# Patient Record
Sex: Female | Born: 1955 | Race: Black or African American | Hispanic: No | Marital: Married | Smoking: Former smoker
Health system: Southern US, Community
[De-identification: ages and names within clinical notes are randomized; demographics above are authoritative.]

## PROBLEM LIST (undated history)

## (undated) DIAGNOSIS — E669 Obesity, unspecified: Secondary | ICD-10-CM

## (undated) DIAGNOSIS — E119 Type 2 diabetes mellitus without complications: Secondary | ICD-10-CM

## (undated) DIAGNOSIS — I4891 Unspecified atrial fibrillation: Secondary | ICD-10-CM

## (undated) DIAGNOSIS — I10 Essential (primary) hypertension: Secondary | ICD-10-CM

## (undated) DIAGNOSIS — I509 Heart failure, unspecified: Secondary | ICD-10-CM

## (undated) DIAGNOSIS — T884XXA Failed or difficult intubation, initial encounter: Secondary | ICD-10-CM

## (undated) DIAGNOSIS — R911 Solitary pulmonary nodule: Secondary | ICD-10-CM

## (undated) DIAGNOSIS — S42401A Unspecified fracture of lower end of right humerus, initial encounter for closed fracture: Secondary | ICD-10-CM

## (undated) DIAGNOSIS — G4730 Sleep apnea, unspecified: Secondary | ICD-10-CM

## (undated) DIAGNOSIS — R0600 Dyspnea, unspecified: Secondary | ICD-10-CM

## (undated) DIAGNOSIS — Z0100 Encounter for examination of eyes and vision without abnormal findings: Secondary | ICD-10-CM

## (undated) DIAGNOSIS — Z87891 Personal history of nicotine dependence: Secondary | ICD-10-CM

## (undated) DIAGNOSIS — J45909 Unspecified asthma, uncomplicated: Secondary | ICD-10-CM

## (undated) DIAGNOSIS — G4700 Insomnia, unspecified: Secondary | ICD-10-CM

## (undated) DIAGNOSIS — E785 Hyperlipidemia, unspecified: Secondary | ICD-10-CM

## (undated) DIAGNOSIS — D649 Anemia, unspecified: Secondary | ICD-10-CM

## (undated) DIAGNOSIS — Z9889 Other specified postprocedural states: Secondary | ICD-10-CM

## (undated) DIAGNOSIS — R112 Nausea with vomiting, unspecified: Secondary | ICD-10-CM

## (undated) DIAGNOSIS — Z973 Presence of spectacles and contact lenses: Secondary | ICD-10-CM

## (undated) DIAGNOSIS — I5042 Chronic combined systolic (congestive) and diastolic (congestive) heart failure: Secondary | ICD-10-CM

## (undated) HISTORY — DX: Other specified postprocedural states: Z98.890

## (undated) HISTORY — DX: Solitary pulmonary nodule: R91.1

## (undated) HISTORY — DX: Encounter for examination of eyes and vision without abnormal findings: Z01.00

## (undated) HISTORY — DX: Obesity, unspecified: E66.9

## (undated) HISTORY — DX: Essential (primary) hypertension: I10

## (undated) HISTORY — DX: Unspecified asthma, uncomplicated: J45.909

## (undated) HISTORY — DX: Nausea with vomiting, unspecified: R11.2

## (undated) HISTORY — DX: Insomnia, unspecified: G47.00

## (undated) HISTORY — PX: JOINT REPLACEMENT: SHX530

## (undated) HISTORY — DX: Anemia, unspecified: D64.9

## (undated) HISTORY — DX: Hyperlipidemia, unspecified: E78.5

## (undated) HISTORY — PX: BREAST SURGERY: SHX581

## (undated) HISTORY — PX: ABDOMINAL HYSTERECTOMY: SHX81

## (undated) HISTORY — DX: Type 2 diabetes mellitus without complications: E11.9

## (undated) HISTORY — PX: PARTIAL HYSTERECTOMY: SHX80

## (undated) HISTORY — DX: Unspecified fracture of lower end of right humerus, initial encounter for closed fracture: S42.401A

## (undated) HISTORY — DX: Personal history of nicotine dependence: Z87.891

## (undated) HISTORY — PX: BREAST EXCISIONAL BIOPSY: SUR124

## (undated) HISTORY — PX: TONSILLECTOMY: SUR1361

## (undated) SURGICAL SUPPLY — 2 items
WATCHMAN FLX PRO PROCEDURE (KITS) ×1 IMPLANT
WATCHMAN TRUSTEER PROCEDURE (KITS) ×1 IMPLANT

---
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
## 1983-02-12 HISTORY — PX: HYSTERECTOMY ABDOMINAL WITH SALPINGECTOMY: SHX6725

## 1997-05-12 ENCOUNTER — Encounter: Admission: RE | Admit: 1997-05-12 | Discharge: 1997-08-10 | Payer: Self-pay | Admitting: Family Medicine

## 2001-05-19 ENCOUNTER — Encounter: Payer: Self-pay | Admitting: Family Medicine

## 2001-05-19 ENCOUNTER — Encounter: Admission: RE | Admit: 2001-05-19 | Discharge: 2001-05-19 | Payer: Self-pay | Admitting: Family Medicine

## 2002-11-12 ENCOUNTER — Encounter: Payer: Self-pay | Admitting: Family Medicine

## 2002-11-12 ENCOUNTER — Encounter: Admission: RE | Admit: 2002-11-12 | Discharge: 2002-11-12 | Payer: Self-pay | Admitting: Family Medicine

## 2004-02-12 HISTORY — PX: ELBOW ARTHROPLASTY: SHX928

## 2004-09-19 ENCOUNTER — Emergency Department (HOSPITAL_COMMUNITY): Admission: EM | Admit: 2004-09-19 | Discharge: 2004-09-19 | Payer: Self-pay | Admitting: Emergency Medicine

## 2005-02-11 DIAGNOSIS — S42401A Unspecified fracture of lower end of right humerus, initial encounter for closed fracture: Secondary | ICD-10-CM

## 2005-02-11 HISTORY — DX: Unspecified fracture of lower end of right humerus, initial encounter for closed fracture: S42.401A

## 2005-09-02 LAB — HM COLONOSCOPY

## 2006-06-09 ENCOUNTER — Ambulatory Visit: Payer: Self-pay | Admitting: Family Medicine

## 2006-08-25 ENCOUNTER — Ambulatory Visit: Payer: Self-pay | Admitting: Family Medicine

## 2006-10-27 ENCOUNTER — Ambulatory Visit: Payer: Self-pay | Admitting: Family Medicine

## 2007-03-03 ENCOUNTER — Ambulatory Visit: Payer: Self-pay | Admitting: Family Medicine

## 2007-03-12 ENCOUNTER — Ambulatory Visit: Payer: Self-pay | Admitting: Family Medicine

## 2007-07-01 ENCOUNTER — Encounter: Admission: RE | Admit: 2007-07-01 | Discharge: 2007-07-01 | Payer: Self-pay | Admitting: Family Medicine

## 2007-07-01 ENCOUNTER — Ambulatory Visit: Payer: Self-pay | Admitting: Family Medicine

## 2007-07-09 ENCOUNTER — Ambulatory Visit: Payer: Self-pay | Admitting: Family Medicine

## 2007-10-26 ENCOUNTER — Ambulatory Visit: Payer: Self-pay | Admitting: Family Medicine

## 2008-12-28 ENCOUNTER — Ambulatory Visit: Payer: Self-pay | Admitting: Family Medicine

## 2009-07-31 ENCOUNTER — Ambulatory Visit (HOSPITAL_COMMUNITY): Admission: RE | Admit: 2009-07-31 | Discharge: 2009-07-31 | Payer: Self-pay | Admitting: Family Medicine

## 2009-07-31 LAB — HM MAMMOGRAPHY: HM Mammogram: NEGATIVE

## 2010-02-11 DIAGNOSIS — E119 Type 2 diabetes mellitus without complications: Secondary | ICD-10-CM

## 2010-02-11 HISTORY — DX: Type 2 diabetes mellitus without complications: E11.9

## 2010-02-22 ENCOUNTER — Encounter
Admission: RE | Admit: 2010-02-22 | Discharge: 2010-02-22 | Payer: Self-pay | Source: Home / Self Care | Attending: Family Medicine | Admitting: Family Medicine

## 2010-02-22 ENCOUNTER — Ambulatory Visit
Admission: RE | Admit: 2010-02-22 | Discharge: 2010-02-22 | Payer: Self-pay | Source: Home / Self Care | Attending: Family Medicine | Admitting: Family Medicine

## 2010-05-31 ENCOUNTER — Ambulatory Visit (INDEPENDENT_AMBULATORY_CARE_PROVIDER_SITE_OTHER): Payer: 59 | Admitting: Family Medicine

## 2010-05-31 DIAGNOSIS — M25519 Pain in unspecified shoulder: Secondary | ICD-10-CM

## 2010-07-05 ENCOUNTER — Telehealth: Payer: Self-pay | Admitting: Family Medicine

## 2010-07-05 NOTE — Telephone Encounter (Signed)
Pt's husband Casimiro Needle came by thought ins was denying meds Lipitor & Azor, called pharmacy, does not require P.A., pt just has high copay and Rx deductable.  Called pt back & explained.  Gave pt samples Lipitor 40mg  #14 & discount card and Azor 5/20 #28 per [place].

## 2010-08-07 ENCOUNTER — Other Ambulatory Visit: Payer: Self-pay | Admitting: Family Medicine

## 2010-08-07 DIAGNOSIS — Z1231 Encounter for screening mammogram for malignant neoplasm of breast: Secondary | ICD-10-CM

## 2010-08-09 ENCOUNTER — Ambulatory Visit (HOSPITAL_COMMUNITY)
Admission: RE | Admit: 2010-08-09 | Discharge: 2010-08-09 | Disposition: A | Discharge status: Home / Self Care | Payer: 59 | Source: Ambulatory Visit | Attending: Family Medicine | Admitting: Family Medicine

## 2010-08-09 DIAGNOSIS — Z1231 Encounter for screening mammogram for malignant neoplasm of breast: Secondary | ICD-10-CM | POA: Insufficient documentation

## 2010-09-03 ENCOUNTER — Telehealth: Payer: Self-pay | Admitting: Family Medicine

## 2010-09-03 MED ORDER — AMLODIPINE-OLMESARTAN 5-20 MG PO TABS: 1.0000 | ORAL_TABLET | Freq: Every day | ORAL | Status: DC

## 2010-09-03 MED ORDER — ATORVASTATIN CALCIUM 40 MG PO TABS: 40.0000 mg | ORAL_TABLET | Freq: Every day | ORAL | Status: DC

## 2010-09-03 NOTE — Telephone Encounter (Signed)
Lipitor and Azor called in if we have samples of Azor given to her

## 2010-09-03 NOTE — Telephone Encounter (Signed)
Lipitor and Azor called in.

## 2010-09-03 NOTE — Telephone Encounter (Signed)
CALLED PT TO LET  HER KNOW AZOR UP FRONT AND MED HAS BEEN CALLED IN

## 2010-09-21 ENCOUNTER — Ambulatory Visit (INDEPENDENT_AMBULATORY_CARE_PROVIDER_SITE_OTHER): Payer: 59 | Admitting: Medical

## 2010-09-21 ENCOUNTER — Encounter: Payer: Self-pay | Admitting: Medical

## 2010-09-21 VITALS — BP 130/80 | HR 100 | Temp 97.8°F | Resp 20 | Ht 62.0 in | Wt 210.0 lb

## 2010-09-21 DIAGNOSIS — J069 Acute upper respiratory infection, unspecified: Secondary | ICD-10-CM

## 2010-09-21 MED ORDER — AZITHROMYCIN 250 MG PO TABS: ORAL_TABLET | ORAL | Status: AC

## 2010-09-21 NOTE — Progress Notes (Signed)
  Subjective:     Darlene Wade is a 55 y.o. female who presents for 3 day hx/o head cold, not feeling well, nose stuffy, nausea, and scratchy throat.  She has a little diarrhea.  Using OTC Coricidin HBP.  She notes the last time she had head cold like this it took weeks to get over.  No hx/o recurrent sinusitis though.  No sick contacts.   No other aggravating or relieving factors.  No other c/o.  The following portions of the patient's history were reviewed and updated as appropriate: allergies, current medications, past family history, past medical history, past social history, past surgical history and problem list.  Past Medical History  Diagnosis Date  . Hypertension   . Hyperlipidemia   . Impaired fasting glucose     Review of Systems Constitutional: denies fever, chills, sweats, anorexia Skin: denies rash HEENT: denies ear pain, itchy watery eyes Cardiovascular: denies chest pain Lungs: denies wheezing, SOB Abdomen: denies abdominal pain, vomiting GU: denies dysuria  Objective:   Filed Vitals:   09/21/10 1140  BP: 130/80  Pulse: 100  Temp: 97.8 F (36.6 C)  Resp: 20    General appearance: Alert, WD/WN, no distress, mildly ill appearing, sinuses sound congested                             Skin: warm, no rash                           Head: no sinus tenderness                            Eyes: conjunctiva normal, corneas clear, PERRLA                            Ears: pearly TMs, external ear canals normal                          Nose: septum midline, turbinates swollen, with erythema and clear discharge             Mouth/throat: MMM, tongue normal, mild pharyngeal erythema                           Neck: supple, no adenopathy, no thyromegaly, nontender                          Heart: RRR, normal S1, S2, no murmurs                         Lungs: CTA bilaterally, no wheezes, rales, or rhonchi     Assessment:     Encounter Diagnosis  Name Primary?  . URI (upper  respiratory infection) Yes     Plan:   Discussed diagnosis and treatment of URI.  Suggested symptomatic OTC remedies.  Nasal saline spray for congestion.  Tylenol or Ibuprofen OTC for fever and malaise.  Call/return in 2-3 days if symptoms aren't resolving.  If much worse over weekend with thick nasal drainage, fever over 101, productive sputum, then begin Zpak.

## 2010-09-21 NOTE — Patient Instructions (Signed)
Continue Coricidin HBP for cough and symptoms, rest, hydrate well with water.  If over the weekend you develop fever >101, worse thick nasal discharge, productive sputum, or continue to worsen, then you may begin antibiotic.    Otherwise continue what you are doing to treat the symptoms.

## 2010-12-10 ENCOUNTER — Encounter: Payer: Self-pay | Admitting: Medical

## 2010-12-10 ENCOUNTER — Ambulatory Visit (INDEPENDENT_AMBULATORY_CARE_PROVIDER_SITE_OTHER): Payer: 59 | Admitting: Medical

## 2010-12-10 VITALS — BP 122/80 | HR 72 | Temp 98.2°F | Resp 16 | Wt 212.0 lb

## 2010-12-10 DIAGNOSIS — M79609 Pain in unspecified limb: Secondary | ICD-10-CM

## 2010-12-10 DIAGNOSIS — M19079 Primary osteoarthritis, unspecified ankle and foot: Secondary | ICD-10-CM

## 2010-12-10 DIAGNOSIS — M21619 Bunion of unspecified foot: Secondary | ICD-10-CM

## 2010-12-10 DIAGNOSIS — M21611 Bunion of right foot: Secondary | ICD-10-CM

## 2010-12-10 DIAGNOSIS — M79673 Pain in unspecified foot: Secondary | ICD-10-CM | POA: Insufficient documentation

## 2010-12-10 NOTE — Progress Notes (Signed)
Subjective:   HPI  Darlene Wade is a 55 y.o. female who presents for bilat feet pain.   She notes that she has been having pain and changes in her feet for months.  She has seen 2 different podiatrists for the same problem.  She notes that the first podiatrist gave injections of steroid at the site of her pain, used and orthotic for heel spurs and arthritis, but the orthotic made the pain worse.  The injections didn't help either.  The second podiatrists gave additional steroid injections into the feet with no benefit.  They too made heel orthotics.  They advised diagnoses of heel spurs bilat, arthritis of the feet, and bunions.  She notes really no improvements.  She c/o heel pain bilat, thickening of the sole of her left foot, bony growth in the middle of both feet, and swelling in the area of the bony growths. Over the weekend the pain and swelling had flared up.  Her last visit with podiatry was 6 weeks ago.   She works all day on her feet at [employer].  No other aggravating or relieving factors.  Denies surgery , injury, or trauma otherwise to feet. No other c/o.   The following portions of the patient's history were reviewed and updated as appropriate: allergies, current medications, past family history, past medical history, past social history, past surgical history and problem list.  Past Medical History  Diagnosis Date  . Hypertension   . Hyperlipidemia   . Impaired fasting glucose    No past surgical history on file.  Review of Systems Constitutional: -fever, -chills, -sweats, -unexpected -weight change,-fatigue ENT: =runny nose, -ear pain, =sore throat Cardiology:  -chest pain, -palpitations, +edema Respiratory: -cough, -shortness of breath, -wheezing Gastroenterology: -abdominal pain, -nausea, -vomiting, -diarrhea, -constipation Hematology: -bleeding or bruising problems Musculoskeletal: -arthralgias, -myalgias, -joint swelling, -back pain Ophthalmology: -vision  changes Urology: -dysuria, -difficulty urinating, -hematuria, -urinary frequency, -urgency Neurology: -headache, -weakness, -tingling, -numbness    Objective:   Physical Exam  Filed Vitals:   12/10/10 1616  BP: 122/80  Pulse: 72  Temp: 98.2 F (36.8 C)  Resp: 16    General appearance: alert, no distress, WD/WN, black female, overweight Skin: unremarkable MSK: left foot with bony prominence of great toe MTP c/w bunion, bony arthritic change of 2nd and 3rd metatarsals, volar mid and medial foot with tough tender area that could represent scar tissue, heel tender throughout, right foot with bony prominence of great toe MTP c/w bunion, worse than left foot, larger bony growth in middle of foot, 2nd - 3rd metatarsals suggestive of arthritic changes, heel tenderness, otherwise no obvious swelling, otherwise foot exam unremarkable Pulses: 1+ pedal pulses, normal cap refill Neuro: normal strength and sensation of feet and feet   Assessment and Plan :    Encounter Diagnoses  Name Primary?  Marland Kitchen Foot pain Yes  . Bilateral bunions   . Osteoarthritis of foot    Given exam findings and prior podiatry treatment, advised that she would probably benefit from orthopedic eval.  I will call and find an orthopedic specialist that specializes in feet.  We will have her sign for podiatry records.  Will refer for further eval and management.   Follow-up with referral.

## 2010-12-11 ENCOUNTER — Telehealth: Payer: Self-pay | Admitting: Family Medicine

## 2010-12-11 NOTE — Telephone Encounter (Signed)
Message copied by Janeice Robinson on Tue Dec 11, 2010 11:24 AM ------      Message from: Jac Canavan      Created: Mon Dec 10, 2010  6:38 PM       Refer to [facility], Dr. Victorino Dike for bilat foot pain, osteoarthritis, bunions, and hx/o podiatry eval and treatment without improvement.              We will need to get prior podiatry notes and send them along.  I would maker her appt for about 2 wks out or more to have time to get records.

## 2010-12-13 HISTORY — PX: FOOT ARTHROTOMY: SUR104

## 2010-12-20 ENCOUNTER — Encounter: Payer: Self-pay | Admitting: Family Medicine

## 2010-12-20 ENCOUNTER — Ambulatory Visit (INDEPENDENT_AMBULATORY_CARE_PROVIDER_SITE_OTHER): Payer: 59 | Admitting: Family Medicine

## 2010-12-20 DIAGNOSIS — R635 Abnormal weight gain: Secondary | ICD-10-CM

## 2010-12-20 DIAGNOSIS — R7301 Impaired fasting glucose: Secondary | ICD-10-CM

## 2010-12-20 DIAGNOSIS — I10 Essential (primary) hypertension: Secondary | ICD-10-CM

## 2010-12-20 DIAGNOSIS — E7800 Pure hypercholesterolemia, unspecified: Secondary | ICD-10-CM

## 2010-12-20 NOTE — Patient Instructions (Signed)
Please try and exercise at least 30-60 minutes every day--you may break this up into 15 minute intervals if you need to. If your labs are all normal, then I will fax back the form to [facility] with your clearance for surgery

## 2010-12-20 NOTE — Progress Notes (Signed)
Patient presents for surgical clearance for foot surgery. Planning to operate on right foot first.  She recently saw Vincenza Hews, after having 2 podiatry evaluations, treated with cortisone shots and orthotics without benefit.  He referred her to [facility] orthopedics.  She saw Dr. Victorino Dike.  They did x-rays and have recommended surgery.  Injections and certain shoes were recommended, however she declined this (unable to wear those shoes at work, has failed injections twice in the past).  Planning for surgery on R foot --per faxed medical clearance request, procedure is: Rt gastroc recession, Rt mod McBride bunionectomy, Rt 2nd and 3rd TMT arthorodesis, Rt Lapidus (1st TMT arthrodesis).  She has hypertension and hyperlipidemia.  Review of chart shows that last labs were 02/2010.  She also has h/o impaired fasting glucose--glucose in January was 102.  She reports being compliant with her medications.  She quit smoking last year, and has been unable to exercise much recently due to her foot pain, and has gained some weight.  She would like to have her thyroid checked. It was last checked 2 years ago and was normal.  Past Medical History  Diagnosis Date  . Hypertension   . Hyperlipidemia   . Impaired fasting glucose   . Elbow fracture, right 2007    Past Surgical History  Procedure Date  . Partial hysterectomy age 9    uterine fibroids    History   Social History  . Marital Status: Married    Spouse Name: N/A    Number of Children: N/A  . Years of Education: N/A   Occupational History  . Not on file.   Social History Main Topics  . Smoking status: Former Smoker    Quit date: 04/11/2009  . Smokeless tobacco: Never Used  . Alcohol Use: Yes     twice a year  . Drug Use: No  . Sexually Active: Not on file   Other Topics Concern  . Not on file   Social History Narrative  . No narrative on file    Family History  Problem Relation Age of Onset  . Diabetes Mother   . Hypertension Mother   .  Diabetes Maternal Aunt   . Heart disease Maternal Grandfather   . Cancer Neg Hx    Current Outpatient Prescriptions on File Prior to Visit  Medication Sig Dispense Refill  . amLODipine-olmesartan (AZOR) 5-20 MG per tablet Take 1 tablet by mouth daily.  30 tablet  5  . atorvastatin (LIPITOR) 40 MG tablet Take 1 tablet (40 mg total) by mouth daily.  30 tablet  5   Allergies  Allergen Reactions  . Naproxen   . Vicodin (Hydrocodone-Acetaminophen)    ROS:  Denies headaches, dizziness, numbness, tingling, weakness, chest pain, palpitations.  Mild cold symptoms currently.  Denies nausea, vomiting, diarrhea or stool changes, no abdominal pain.  No GU complaints.  Shoulder pain resolved.  No skin concerns, bleeding problems, or other concerns.  Denies fevers  PHYSICAL EXAM: BP 120/70  Pulse 72  Temp(Src) 98.3 F (36.8 C) (Oral)  Resp 16  Ht 5\' 2"  (1.575 m)  Wt 216 lb (97.977 kg)  BMI 39.51 kg/m2 Well developed, pleasant overweight female in no distress HEENT: PERRL, EOMI, conjunctiva clear. OP clear without lesions. Neck: no lymphadenopathy, thyromegaly or carotid bruit Heart: regular rate and rhythm without murmurs, rubs or gallops Abdomen: soft, nontender, no organomegaly or mass Extremities: no edema, 2+ pulse.  Full evaluation of feet not performed--see Shane's recent note Skin: no rash Psych:  normal mood, affect, hygiene and grooming  ASSESSMENT/PLAN:  1. Pure hypercholesterolemia  Lipid panel   controlled, due for labs  2. Essential hypertension, benign  Comprehensive metabolic panel   well controlled  3. Impaired fasting glucose  Hemoglobin A1c   labs due.  Discussed exercise, low carb diet, weight loss  4. Weight gain  TSH   requesting thyroid check.  Discussed need for exercise, diet   As long as labs are okay (no uncontrolled DM, etc.), then forms will be filled out and faxed to Dr. Victorino Dike giving clearance for her surgery

## 2010-12-21 ENCOUNTER — Other Ambulatory Visit: Payer: 59

## 2010-12-21 DIAGNOSIS — R7301 Impaired fasting glucose: Secondary | ICD-10-CM

## 2010-12-21 DIAGNOSIS — R635 Abnormal weight gain: Secondary | ICD-10-CM

## 2010-12-21 DIAGNOSIS — E7800 Pure hypercholesterolemia, unspecified: Secondary | ICD-10-CM

## 2010-12-21 DIAGNOSIS — I10 Essential (primary) hypertension: Secondary | ICD-10-CM

## 2010-12-21 LAB — LIPID PANEL
Cholesterol: 167 mg/dL (ref 0–200)
Total CHOL/HDL Ratio: 3 Ratio
Triglycerides: 87 mg/dL (ref <150)
VLDL: 17 mg/dL (ref 0–40)

## 2010-12-21 LAB — COMPREHENSIVE METABOLIC PANEL
Albumin: 3.8 g/dL (ref 3.5–5.2)
Alkaline Phosphatase: 78 U/L (ref 39–117)
BUN: 12 mg/dL (ref 6–23)
Calcium: 8.9 mg/dL (ref 8.4–10.5)
Creat: 0.77 mg/dL (ref 0.50–1.10)
Glucose, Bld: 121 mg/dL — ABNORMAL HIGH (ref 70–99)
Potassium: 4.3 mEq/L (ref 3.5–5.3)

## 2010-12-21 LAB — HEMOGLOBIN A1C: Mean Plasma Glucose: 137 mg/dL — ABNORMAL HIGH (ref <117)

## 2010-12-21 LAB — TSH: TSH: 1.388 u[IU]/mL (ref 0.350–4.500)

## 2010-12-24 ENCOUNTER — Other Ambulatory Visit: Payer: Self-pay | Admitting: *Deleted

## 2010-12-24 DIAGNOSIS — E119 Type 2 diabetes mellitus without complications: Secondary | ICD-10-CM

## 2010-12-24 MED ORDER — METFORMIN HCL 500 MG PO TABS: 500.0000 mg | ORAL_TABLET | Freq: Every day | ORAL | Status: DC

## 2011-03-04 ENCOUNTER — Telehealth: Payer: Self-pay | Admitting: Internal Medicine

## 2011-03-04 DIAGNOSIS — E782 Mixed hyperlipidemia: Secondary | ICD-10-CM

## 2011-03-04 MED ORDER — ATORVASTATIN CALCIUM 40 MG PO TABS: 40.0000 mg | ORAL_TABLET | Freq: Every day | ORAL | Status: DC

## 2011-03-04 NOTE — Telephone Encounter (Signed)
Refill for 1 month of atorvastatin 40mg  sent to [facility] as pt has appt scheduled 03/28/11.

## 2011-03-14 ENCOUNTER — Telehealth: Payer: Self-pay | Admitting: Internal Medicine

## 2011-03-14 DIAGNOSIS — I10 Essential (primary) hypertension: Secondary | ICD-10-CM

## 2011-03-14 MED ORDER — AMLODIPINE-OLMESARTAN 5-20 MG PO TABS: 1.0000 | ORAL_TABLET | Freq: Every day | ORAL | Status: DC

## 2011-03-15 NOTE — Telephone Encounter (Signed)
Done

## 2011-03-22 ENCOUNTER — Encounter: Payer: Self-pay | Admitting: Internal Medicine

## 2011-03-28 ENCOUNTER — Ambulatory Visit: Payer: 59 | Admitting: Family Medicine

## 2011-04-01 ENCOUNTER — Ambulatory Visit (INDEPENDENT_AMBULATORY_CARE_PROVIDER_SITE_OTHER): Payer: Self-pay | Admitting: Family Medicine

## 2011-04-01 ENCOUNTER — Encounter: Payer: Self-pay | Admitting: Family Medicine

## 2011-04-01 VITALS — BP 120/78 | HR 76 | Ht 62.0 in | Wt 220.0 lb

## 2011-04-01 DIAGNOSIS — I10 Essential (primary) hypertension: Secondary | ICD-10-CM | POA: Insufficient documentation

## 2011-04-01 DIAGNOSIS — R7301 Impaired fasting glucose: Secondary | ICD-10-CM | POA: Insufficient documentation

## 2011-04-01 DIAGNOSIS — E7800 Pure hypercholesterolemia, unspecified: Secondary | ICD-10-CM | POA: Insufficient documentation

## 2011-04-01 DIAGNOSIS — E119 Type 2 diabetes mellitus without complications: Secondary | ICD-10-CM

## 2011-04-01 MED ORDER — AMLODIPINE-OLMESARTAN 5-20 MG PO TABS: 1.0000 | ORAL_TABLET | Freq: Every day | ORAL | Status: DC

## 2011-04-01 NOTE — Progress Notes (Signed)
Darlene Wade has been out of work on disability related to her right foot surgery.  Wasn't aware that her insurance wasn't effective until Darlene Wade arrived for visit.  Hoping to get that figured out soon.  Darlene Wade presents to follow up on her blood sugars.  Was never diagnosed with diabetes, just impaired fasting glucose/pre-diabetes.  A1c at last visit was 6.4 with fasting sugar of 121.  Darlene Wade was started on Metformin once daily.  Denies any side effects.  Sugars are running under 110 most of the time.  Had a low of 88, usually 95-105, never >120.  Occasionally has some loose stool, infrequent.  HTN--doing well.  Compliant with meds. Denies headaches or dizziness Hyperlipidemia--compliant with meds, diet.  Denies side effects  Past Medical History  Diagnosis Date  . Hypertension   . Hyperlipidemia   . Impaired fasting glucose   . Elbow fracture, right 2007  . Smoker     Past Surgical History  Procedure Date  . Partial hysterectomy age 61    uterine fibroids    History   Social History  . Marital Status: Married    Spouse Name: N/A    Number of Children: N/A  . Years of Education: N/A   Occupational History  . Not on file.   Social History Main Topics  . Smoking status: Former Smoker    Quit date: 04/11/2009  . Smokeless tobacco: Never Used  . Alcohol Use: Yes     twice a year  . Drug Use: No  . Sexually Active: Not on file   Other Topics Concern  . Not on file   Social History Narrative  . No narrative on file    Family History  Problem Relation Age of Onset  . Diabetes Mother   . Hypertension Mother   . Diabetes Maternal Aunt   . Heart disease Maternal Grandfather   . Cancer Neg Hx     Current outpatient prescriptions:amLODipine-olmesartan (AZOR) 5-20 MG per tablet, Take 1 tablet by mouth daily., Disp: 30 tablet, Rfl: 1;  atorvastatin (LIPITOR) 40 MG tablet, Take 1 tablet (40 mg total) by mouth daily., Disp: 30 tablet, Rfl: 0;  metFORMIN (GLUCOPHAGE) 500 MG tablet, Take 1  tablet (500 mg total) by mouth daily with breakfast., Disp: 30 tablet, Rfl: 3 traMADol (ULTRAM) 50 MG tablet, Take 50 mg by mouth every 6 (six) hours as needed., Disp: , Rfl:   Allergies  Allergen Reactions  . Naproxen Nausea Only  . Vicodin (Hydrocodone-Acetaminophen) Nausea Only   ROS: Denies fevers, URI symptoms, cough, shortness of breath, chest pain. Occasional diarrhea.  No vision changes--saw eye doctor last week, slight Rx change.  no skin rashes/lesions.  Recovering from her foot surgery--wearing boot when walking around, and gradually getting out of the boot. Occasional back pain  PHYSICAL EXAM: BP 120/78  Pulse 76  Ht 5\' 2"  (1.575 m)  Wt 220 lb (99.791 kg)  BMI 40.24 kg/m2 Weight was 220 with boot on Well developed, pleasant, obese female in no distress Neck: no lymphadenopathy or mass Heart: regular rate and rhythm Lungs: clear bilaterally Extremities: R foot in boot.  L foot without edema Skin-no rashes  Lab Results  Component Value Date   HGBA1C 6.3 04/01/2011   ASSESSMENT/PLAN: 1. Type II or unspecified type diabetes mellitus without mention of complication, not stated as uncontrolled  POCT HgB A1C  2. Impaired fasting glucose    3. Unspecified essential hypertension  amLODipine-olmesartan (AZOR) 5-20 MG per tablet  4. Pure hypercholesterolemia  Continue metformin  Hyperlipidemia--continue lipitor--labs not due for another 3 months.  Discussed cost, consider changing to 80mg  and cutting in 1/2 if insurance doesn't get straightened out  HTN--well controlled.  If ongoing insurance issue, consider changing to generic meds (ie amlodipine and losartan).  For now, will give samples  Med check in 3 months, fasting

## 2011-04-01 NOTE — Patient Instructions (Signed)
Check on prices of atorvastatin (generic Lipitor) at [employer], [employer], [facility].  Check on price of both 40mg  and 80mg .  If 80mg  is about the same price, you can cut tablet in 1/2 to save money

## 2011-04-22 DIAGNOSIS — Z0271 Encounter for disability determination: Secondary | ICD-10-CM

## 2011-05-21 ENCOUNTER — Telehealth: Payer: Self-pay | Admitting: Family Medicine

## 2011-05-21 NOTE — Telephone Encounter (Signed)
LM

## 2011-05-29 ENCOUNTER — Telehealth: Payer: Self-pay | Admitting: Internal Medicine

## 2011-05-29 DIAGNOSIS — E119 Type 2 diabetes mellitus without complications: Secondary | ICD-10-CM

## 2011-05-29 MED ORDER — METFORMIN HCL 500 MG PO TABS: 500.0000 mg | ORAL_TABLET | Freq: Every day | ORAL | Status: DC

## 2011-05-29 NOTE — Telephone Encounter (Signed)
Refilled medication x 1 month. Left message for patient to let her know that she is due for med check ~ 06/29/11 according to last OV note. Asked her to please call and scheduled OV for then as there was not an appt scheduled already.

## 2011-06-20 ENCOUNTER — Telehealth: Payer: Self-pay | Admitting: Family Medicine

## 2011-06-20 DIAGNOSIS — I10 Essential (primary) hypertension: Secondary | ICD-10-CM

## 2011-06-20 MED ORDER — AMLODIPINE-OLMESARTAN 5-20 MG PO TABS: 1.0000 | ORAL_TABLET | Freq: Every day | ORAL | Status: DC

## 2011-06-20 NOTE — Telephone Encounter (Signed)
Called patient to let her know that I gave her # 28 samples of Azor 5/20.

## 2011-07-01 ENCOUNTER — Encounter: Payer: Self-pay | Admitting: Family Medicine

## 2011-07-01 ENCOUNTER — Ambulatory Visit (INDEPENDENT_AMBULATORY_CARE_PROVIDER_SITE_OTHER): Payer: 59 | Admitting: Family Medicine

## 2011-07-01 VITALS — BP 120/78 | HR 84 | Ht 63.0 in | Wt 220.0 lb

## 2011-07-01 DIAGNOSIS — E7800 Pure hypercholesterolemia, unspecified: Secondary | ICD-10-CM

## 2011-07-01 DIAGNOSIS — I10 Essential (primary) hypertension: Secondary | ICD-10-CM

## 2011-07-01 DIAGNOSIS — E119 Type 2 diabetes mellitus without complications: Secondary | ICD-10-CM

## 2011-07-01 LAB — HEPATIC FUNCTION PANEL
Bilirubin, Direct: 0.1 mg/dL (ref 0.0–0.3)
Indirect Bilirubin: 0.2 mg/dL (ref 0.0–0.9)
Total Bilirubin: 0.3 mg/dL (ref 0.3–1.2)

## 2011-07-01 LAB — LIPID PANEL
Total CHOL/HDL Ratio: 3.9 Ratio
VLDL: 26 mg/dL (ref 0–40)

## 2011-07-01 LAB — GLUCOSE, RANDOM: Glucose, Bld: 95 mg/dL (ref 70–99)

## 2011-07-01 LAB — POCT GLYCOSYLATED HEMOGLOBIN (HGB A1C): Hemoglobin A1C: 6.5

## 2011-07-01 MED ORDER — AMLODIPINE-OLMESARTAN 5-20 MG PO TABS: 1.0000 | ORAL_TABLET | Freq: Every day | ORAL | Status: DC

## 2011-07-01 MED ORDER — METFORMIN HCL 500 MG PO TABS: 500.0000 mg | ORAL_TABLET | Freq: Two times a day (BID) | ORAL | Status: DC

## 2011-07-01 NOTE — Progress Notes (Signed)
Chief Complaint  Patient presents with  . Follow-up    3 month f/u on DM, needs refill on meds excet tramadol   HPI:  Diabetes follow-up:  Blood sugars at home are running 92-120.  Denies hypoglycemia.  Denies polydipsia and polyuria. Some occasional loose stools, she thinks related to metformin.  Last eye exam was last year.  Patient follows a low sugar diet and checks feet regularly without concerns. Does water aerobics 3x/week, and also gets on treadmill, bike.  Hyperlipidemia follow-up:  Patient is reportedly following a low-fat, low cholesterol diet.  Compliant with medications and denies medication side effects  Hypertension follow-up:  Blood pressures are not checked elsewhere.  Denies dizziness, chest pain, leg swelling (just related to foot surgery.  Denies side effects of medications. Occasional headaches, related to stress  Past Medical History  Diagnosis Date  . Hypertension   . Hyperlipidemia   . Impaired fasting glucose   . Elbow fracture, right 2007  . Smoker     Past Surgical History  Procedure Date  . Partial hysterectomy age 56    uterine fibroids    History   Social History  . Marital Status: Married    Spouse Name: N/A    Number of Children: N/A  . Years of Education: N/A   Occupational History  . Not on file.   Social History Main Topics  . Smoking status: Former Smoker    Quit date: 04/11/2009  . Smokeless tobacco: Never Used  . Alcohol Use: Yes     twice a year  . Drug Use: No  . Sexually Active: Not on file   Other Topics Concern  . Not on file   Social History Narrative  . No narrative on file    Family History  Problem Relation Age of Onset  . Diabetes Mother   . Hypertension Mother   . Diabetes Maternal Aunt   . Heart disease Maternal Grandfather   . Cancer Neg Hx     Current outpatient prescriptions:amLODipine-olmesartan (AZOR) 5-20 MG per tablet, Take 1 tablet by mouth daily., Disp: 30 tablet, Rfl: 5;  atorvastatin  (LIPITOR) 40 MG tablet, Take 1 tablet (40 mg total) by mouth daily., Disp: 30 tablet, Rfl: 0;  metFORMIN (GLUCOPHAGE) 500 MG tablet, Take 1 tablet (500 mg total) by mouth 2 (two) times daily with a meal., Disp: 180 tablet, Rfl: 1 traMADol (ULTRAM) 50 MG tablet, Take 50 mg by mouth every 6 (six) hours as needed., Disp: , Rfl: ;  DISCONTD: amLODipine-olmesartan (AZOR) 5-20 MG per tablet, Take 1 tablet by mouth daily., Disp: 28 tablet, Rfl: 0;  DISCONTD: metFORMIN (GLUCOPHAGE) 500 MG tablet, Take 1 tablet (500 mg total) by mouth daily with breakfast., Disp: 30 tablet, Rfl: 0  Allergies  Allergen Reactions  . Naproxen Nausea Only  . Vicodin (Hydrocodone-Acetaminophen) Nausea Only  Doesn't take tramadol anymore--was for postsurgical pain from foot surgery  ROS:  Denies headaches, dizziness, chest pain, palpitations, GI complaints (except as per HPI), GU complaints, skin lesions, swelling, or other concerns. No fevers, URI complaints, shortness of breath.  PHYSICAL EXAM: BP 120/78  Pulse 84  Ht 5\' 3"  (1.6 m)  Wt 220 lb (99.791 kg)  BMI 38.97 kg/m2 Well developed, pleasant, obese female in no distress  Neck: no lymphadenopathy or mass  Heart: regular rate and rhythm  Lungs: clear bilaterally Abdomen: soft, nontender, no mass Extremities: no edema Skin: no lesions, rashes Psych: normal mood, affect, hygiene and grooming  ASSESSMENT/PLAN: 1. DM (diabetes  mellitus)  POCT HgB A1C  2. Unspecified essential hypertension  amLODipine-olmesartan (AZOR) 5-20 MG per tablet  3. Pure hypercholesterolemia  Lipid panel, Hepatic function panel  4. Type II or unspecified type diabetes mellitus without mention of complication, not stated as uncontrolled  metFORMIN (GLUCOPHAGE) 500 MG tablet, Glucose, random, Microalbumin / creatinine urine ratio   DM--Increase metformin to BID--high fiber diet, metamucil.  Discussed need to lose weight.   Annual diabetic eye exam due--reminded to schedule  HTN--well  controlled on current regimen.  WAIT ON LIPID RESULTS--if LDL<100, refill x 6 months  F/u 3 months--f/u DM, weight nonfasting (A1c at visit) If lipid med changed, then needs to be fasting for f/u visit, and add lft's and lipids to be done at visit, or do fasting labs prior to visit

## 2011-07-01 NOTE — Patient Instructions (Addendum)
Increase metformin to twice daily. High fiber diet and/or metamucil can help with the bowels. Increase exercise to 45 minutes 5-6 days/week  Cut out juices--only sugar-free, calorie-free drinks

## 2011-07-02 LAB — MICROALBUMIN / CREATININE URINE RATIO
Microalb Creat Ratio: 3.9 mg/g (ref 0.0–30.0)
Microalb, Ur: 1.08 mg/dL (ref 0.00–1.89)

## 2011-07-22 ENCOUNTER — Telehealth: Payer: Self-pay | Admitting: Family Medicine

## 2011-07-22 NOTE — Telephone Encounter (Signed)
We have $4 copay cards for Lipitor she can try--may need rx sent to pharmacy for DAW rx.  In place of Azor, change to 5mg  of amlodipine and 50mg  of losartan (generic Cozaar).  She will need to come for a nurse visit for a BP check a month after changing medications.

## 2011-07-24 ENCOUNTER — Telehealth: Payer: Self-pay | Admitting: *Deleted

## 2011-07-24 DIAGNOSIS — E7800 Pure hypercholesterolemia, unspecified: Secondary | ICD-10-CM

## 2011-07-24 DIAGNOSIS — I10 Essential (primary) hypertension: Secondary | ICD-10-CM

## 2011-07-24 MED ORDER — LIPITOR 40 MG PO TABS: 40.0000 mg | ORAL_TABLET | Freq: Every day | ORAL | Status: DC

## 2011-07-24 MED ORDER — AMLODIPINE BESYLATE 5 MG PO TABS: 5.0000 mg | ORAL_TABLET | Freq: Every day | ORAL | Status: DC

## 2011-07-24 MED ORDER — LOSARTAN POTASSIUM 50 MG PO TABS: 50.0000 mg | ORAL_TABLET | Freq: Every day | ORAL | Status: DC

## 2011-07-24 NOTE — Telephone Encounter (Signed)
Spoke with patient and let her know that Dr.Knapp is giving her a $4 copay card for Lipitor and changed Azor to 5mg  amlodipine and 50mg  losartan, I called [facility] and called these in as well as a new rx for Lipitor DAW. She is scheduled for 08/26/2011 @ 9:15 for bp check.

## 2011-08-02 ENCOUNTER — Telehealth: Payer: Self-pay | Admitting: Family Medicine

## 2011-08-02 NOTE — Telephone Encounter (Signed)
LM

## 2011-08-16 DIAGNOSIS — Z0271 Encounter for disability determination: Secondary | ICD-10-CM

## 2011-08-26 ENCOUNTER — Other Ambulatory Visit: Payer: 59

## 2011-08-26 NOTE — Progress Notes (Signed)
Pt came in to  Have follow up B/P reading told her I would put results in and put chart on Dr.Knapps desk

## 2011-10-03 ENCOUNTER — Ambulatory Visit: Payer: 59 | Admitting: Family Medicine

## 2011-11-13 ENCOUNTER — Other Ambulatory Visit: Payer: Self-pay | Admitting: *Deleted

## 2011-11-13 ENCOUNTER — Ambulatory Visit (INDEPENDENT_AMBULATORY_CARE_PROVIDER_SITE_OTHER): Payer: Managed Care, Other (non HMO) | Admitting: Family Medicine

## 2011-11-13 ENCOUNTER — Encounter: Payer: Self-pay | Admitting: Family Medicine

## 2011-11-13 VITALS — BP 118/72 | HR 76 | Ht 63.0 in | Wt 219.0 lb

## 2011-11-13 DIAGNOSIS — Z202 Contact with and (suspected) exposure to infections with a predominantly sexual mode of transmission: Secondary | ICD-10-CM

## 2011-11-13 DIAGNOSIS — E119 Type 2 diabetes mellitus without complications: Secondary | ICD-10-CM

## 2011-11-13 DIAGNOSIS — E7800 Pure hypercholesterolemia, unspecified: Secondary | ICD-10-CM

## 2011-11-13 DIAGNOSIS — Z23 Encounter for immunization: Secondary | ICD-10-CM

## 2011-11-13 DIAGNOSIS — Z2089 Contact with and (suspected) exposure to other communicable diseases: Secondary | ICD-10-CM

## 2011-11-13 DIAGNOSIS — I10 Essential (primary) hypertension: Secondary | ICD-10-CM

## 2011-11-13 LAB — COMPREHENSIVE METABOLIC PANEL
Albumin: 4.3 g/dL (ref 3.5–5.2)
Alkaline Phosphatase: 91 U/L (ref 39–117)
CO2: 26 mEq/L (ref 19–32)
Calcium: 9.9 mg/dL (ref 8.4–10.5)
Chloride: 104 mEq/L (ref 96–112)
Glucose, Bld: 112 mg/dL — ABNORMAL HIGH (ref 70–99)
Potassium: 4.5 mEq/L (ref 3.5–5.3)
Sodium: 139 mEq/L (ref 135–145)
Total Protein: 7.7 g/dL (ref 6.0–8.3)

## 2011-11-13 LAB — TSH: TSH: 0.923 u[IU]/mL (ref 0.350–4.500)

## 2011-11-13 LAB — POCT GLYCOSYLATED HEMOGLOBIN (HGB A1C): Hemoglobin A1C: 6.3

## 2011-11-13 MED ORDER — METFORMIN HCL 500 MG PO TABS: 500.0000 mg | ORAL_TABLET | Freq: Two times a day (BID) | ORAL | Status: DC

## 2011-11-13 MED ORDER — GLUCOSE BLOOD VI STRP: ORAL_STRIP | Status: DC

## 2011-11-13 NOTE — Patient Instructions (Signed)
Continue your current medications.  We will call you with results. Try and continue to lose weight. Please consider getting flu shot.  If all labs are within goal, return in 6 months

## 2011-11-13 NOTE — Progress Notes (Signed)
Chief Complaint  Patient presents with  . Diabetes    med check. Pt declines flu vaccine.   Diabetes follow-up: Blood sugars at home are running 75-135 (usually 90-120). Denies hypoglycemia. Denies polydipsia and polyuria. Some occasional loose stools, seems less frequent than before, even with increased metformin dose. Last eye exam was earlier in 2013. Patient follows a low sugar diet and checks feet regularly without concerns. She is on the treadmill 30 minutes 5x/week. Metformin was increased to BID at last visit in May.  Hyperlipidemia follow-up: Patient is reportedly following a low-fat, low cholesterol diet. Lipids at last visit in May were much higher than prior, and she admitted she wasn't taking meds properly due to cost.  She was supposed to return 8/22 for 3 month follow-up but didn't, due to changes with insurance. She got new insurance last month, generic lipitor is now affordable.  She has been compliant with taking the medication for at least the last 2 months.  Hypertension follow-up: Blood pressures are not checked elsewhere. Denies dizziness, chest pain, leg swelling (just related to foot surgery. Denies side effects of medications.   She and her husband recently separated, and she is asking about STD check.  Denies vaginal discharge.  Unsure if he was unfaithful.  Past Medical History  Diagnosis Date  . Hypertension   . Hyperlipidemia   . Impaired fasting glucose   . Elbow fracture, right 2007  . Smoker    Past Surgical History  Procedure Date  . Partial hysterectomy age 55    uterine fibroids   History   Social History  . Marital Status: Married    Spouse Name: N/A    Number of Children: N/A  . Years of Education: N/A   Occupational History  . Not on file.   Social History Main Topics  . Smoking status: Former Smoker    Quit date: 04/11/2009  . Smokeless tobacco: Never Used  . Alcohol Use: Yes     twice a year  . Drug Use: No  . Sexually Active: Not on  file   Other Topics Concern  . Not on file   Social History Narrative   Separated from husband (2013)   Current Outpatient Prescriptions on File Prior to Visit  Medication Sig Dispense Refill  . amLODipine (NORVASC) 5 MG tablet Take 1 tablet (5 mg total) by mouth daily.  30 tablet  5  . LIPITOR 40 MG tablet Take 1 tablet (40 mg total) by mouth daily.  30 tablet  5  . losartan (COZAAR) 50 MG tablet Take 1 tablet (50 mg total) by mouth daily.  30 tablet  5  . metFORMIN (GLUCOPHAGE) 500 MG tablet Take 1 tablet (500 mg total) by mouth 2 (two) times daily with a meal.  180 tablet  1  . traMADol (ULTRAM) 50 MG tablet Take 50 mg by mouth every 6 (six) hours as needed.       Allergies  Allergen Reactions  . Naproxen Nausea Only  . Vicodin (Hydrocodone-Acetaminophen) Nausea Only   ROS: L knee pain/swelling occasionally.  Denies fevers, URI symptoms, chest pain, palpitations, headaches, dizziness, vaginal discharge, pelvic pain, dysuria, skin rash, depression or other concerns.  See HPI  PHYSICAL EXAM: BP 118/72  Pulse 76  Ht 5\' 3"  (1.6 m)  Wt 219 lb (99.338 kg)  BMI 38.79 kg/m2 Well developed, pleasant, obese female in no distress  Neck: no lymphadenopathy or mass  Heart: regular rate and rhythm  Lungs: clear bilaterally  Abdomen: soft, nontender, no mass  Extremities: no edema  Skin: no lesions, rashes  Psych: normal mood, affect, hygiene and grooming  Lab Results  Component Value Date   HGBA1C 6.3 11/13/2011   ASSESSMENT/PLAN:  1. Type II or unspecified type diabetes mellitus without mention of complication, not stated as uncontrolled  HgB A1c, metFORMIN (GLUCOPHAGE) 500 MG tablet, Comprehensive metabolic panel, TSH  2. Unspecified essential hypertension  Comprehensive metabolic panel  3. Pure hypercholesterolemia  Lipid panel, Comprehensive metabolic panel  4. Exposure to STD  HIV Antibody, Hepatitis C antibody, Hepatitis B surface antigen, RPR, GC/chlamydia probe amp, urine    5. Need for Tdap vaccination  Tdap vaccine greater than or equal to 7yo IM  6. Need for pneumococcal vaccination  Pneumococcal polysaccharide vaccine 23-valent greater than or equal to 2yo subcutaneous/IM   DM--controlled HTN--controlled Hyperlipidemia--due for recheck, now that she has been taking medications regularly.  Increase dose if not at goal. Possible exposure to STD  Lipids, c-met, TSH HIV, RPR, hep C (bc baby boomer) and Hep B. Urine GC/chlamydia  Refuses flu shot Agrees to pneumovax, TdaP, both given today  6 months f/u if labs okay, sooner if med changes made

## 2011-11-14 LAB — GC/CHLAMYDIA PROBE AMP, URINE: Chlamydia, Swab/Urine, PCR: NEGATIVE

## 2011-11-14 LAB — HIV ANTIBODY (ROUTINE TESTING W REFLEX): HIV: NONREACTIVE

## 2012-01-22 ENCOUNTER — Telehealth: Payer: Self-pay | Admitting: Internal Medicine

## 2012-01-22 DIAGNOSIS — I10 Essential (primary) hypertension: Secondary | ICD-10-CM

## 2012-01-22 MED ORDER — AMLODIPINE BESYLATE 5 MG PO TABS: 5.0000 mg | ORAL_TABLET | Freq: Every day | ORAL | Status: DC

## 2012-01-22 NOTE — Telephone Encounter (Signed)
Rx refill was sent into the pharmacy. CLS

## 2012-02-10 ENCOUNTER — Telehealth: Payer: Self-pay | Admitting: Medical

## 2012-02-11 NOTE — Telephone Encounter (Signed)
Form ready for completion

## 2012-02-11 NOTE — Telephone Encounter (Signed)
Form ready for completion 

## 2012-02-12 DIAGNOSIS — R112 Nausea with vomiting, unspecified: Secondary | ICD-10-CM

## 2012-02-12 DIAGNOSIS — Z9889 Other specified postprocedural states: Secondary | ICD-10-CM

## 2012-02-12 HISTORY — DX: Other specified postprocedural states: Z98.890

## 2012-02-12 HISTORY — DX: Other specified postprocedural states: R11.2

## 2012-02-13 ENCOUNTER — Telehealth: Payer: Self-pay | Admitting: Internal Medicine

## 2012-02-13 NOTE — Telephone Encounter (Signed)
Fax came over from [facility] for a topical compounded cream that targets diabetic neuropathy. I have spoken with pt, and pt does not want or use this.. HOWEVER if it is for something like diabetic testing supplies she will.

## 2012-03-18 ENCOUNTER — Telehealth: Payer: Self-pay | Admitting: Internal Medicine

## 2012-03-18 DIAGNOSIS — E7800 Pure hypercholesterolemia, unspecified: Secondary | ICD-10-CM

## 2012-03-18 MED ORDER — ATORVASTATIN CALCIUM 40 MG PO TABS: 40.0000 mg | ORAL_TABLET | Freq: Every day | ORAL | Status: DC

## 2012-03-18 NOTE — Telephone Encounter (Signed)
Done. E-scribed.

## 2012-03-28 ENCOUNTER — Other Ambulatory Visit: Payer: Self-pay

## 2012-04-11 HISTORY — PX: FOOT MASS EXCISION: SHX1663

## 2012-05-13 ENCOUNTER — Encounter: Payer: Managed Care, Other (non HMO) | Admitting: Family Medicine

## 2012-05-20 ENCOUNTER — Telehealth: Payer: Self-pay | Admitting: Family Medicine

## 2012-05-20 DIAGNOSIS — I10 Essential (primary) hypertension: Secondary | ICD-10-CM

## 2012-05-20 MED ORDER — LOSARTAN POTASSIUM 50 MG PO TABS: 50.0000 mg | ORAL_TABLET | Freq: Every day | ORAL | Status: DC

## 2012-05-20 NOTE — Telephone Encounter (Signed)
Done

## 2012-05-20 NOTE — Telephone Encounter (Signed)
Fax refill request from [facility]  For Losartan 50mg  #30

## 2012-06-15 ENCOUNTER — Ambulatory Visit (INDEPENDENT_AMBULATORY_CARE_PROVIDER_SITE_OTHER): Payer: No Typology Code available for payment source | Admitting: Medical

## 2012-06-15 ENCOUNTER — Encounter: Payer: Self-pay | Admitting: Medical

## 2012-06-15 VITALS — BP 130/82 | HR 80 | Temp 98.2°F | Resp 16 | Wt 218.0 lb

## 2012-06-15 DIAGNOSIS — I10 Essential (primary) hypertension: Secondary | ICD-10-CM

## 2012-06-15 DIAGNOSIS — T50905A Adverse effect of unspecified drugs, medicaments and biological substances, initial encounter: Secondary | ICD-10-CM

## 2012-06-15 DIAGNOSIS — E119 Type 2 diabetes mellitus without complications: Secondary | ICD-10-CM

## 2012-06-15 DIAGNOSIS — E7800 Pure hypercholesterolemia, unspecified: Secondary | ICD-10-CM

## 2012-06-15 DIAGNOSIS — G4700 Insomnia, unspecified: Secondary | ICD-10-CM

## 2012-06-15 LAB — ALT: ALT: 10 U/L (ref 0–35)

## 2012-06-15 LAB — HEMOGLOBIN A1C: Mean Plasma Glucose: 140 mg/dL — ABNORMAL HIGH (ref <117)

## 2012-06-15 LAB — LIPID PANEL: Total CHOL/HDL Ratio: 2.8 Ratio

## 2012-06-15 MED ORDER — ZOLPIDEM TARTRATE 10 MG PO TABS: 10.0000 mg | ORAL_TABLET | Freq: Every evening | ORAL | Status: DC | PRN

## 2012-06-15 MED ORDER — LOSARTAN POTASSIUM 50 MG PO TABS: 50.0000 mg | ORAL_TABLET | Freq: Every day | ORAL | Status: DC

## 2012-06-15 MED ORDER — AMLODIPINE BESYLATE 5 MG PO TABS: 5.0000 mg | ORAL_TABLET | Freq: Every day | ORAL | Status: DC

## 2012-06-15 MED ORDER — ATORVASTATIN CALCIUM 40 MG PO TABS: 40.0000 mg | ORAL_TABLET | Freq: Every day | ORAL | Status: DC

## 2012-06-15 NOTE — Progress Notes (Signed)
Subjective:    Darlene Wade is a 57 y.o. female who presents for follow-up of Type 2 diabetes mellitus.    Home blood sugar records: fasting range: usually around 100 in the morning.  rarely gets high numbers  Current symptoms/problems include none and have been unchanged. Daily foot checks, foot concerns:  Last eye exam:  03/2011, has f/u planned soon with Dr. Hyacinth Meeker   Medication compliance: Recently only once daily Metformin due to diarrhea.   Been doing this a month and a half.  No other prior diabetes medication.  Was doing BID dosing prior.  When takes once daily, not really a problem with loose stool.  Current diet: in general, a "healthy" diet  , more water, less soda, low cal juices. Current exercise: none Known diabetic complications: none Cardiovascular risk factors: diabetes mellitus, dyslipidemia, hypertension and obesity (BMI >= 30 kg/m2)  Recently had left foot surgery with Tomasita Crumble, Dr. Victorino Dike.  Had some bone cut out and fusion of other bones, several screws placed.   In [facility] walker currently.  Has f/u 06/24/12.    At night, sometimes is restless, not sure if related to the recent surgery and being on pain medication.  Some nights has trouble falling asleep, wakes often.  Doesn't sleep completely all night.  Had sleep study in remote past.  The following portions of the patient's history were reviewed and updated as appropriate: allergies, current medications, past family history, past medical history, past social history, past surgical history and problem list.  ROS as in subjective above    Objective:   Vitals and nurse notes reviewed  General appearance: alert, no distress, WD/WN Neck: supple, no lymphadenopathy, no thyromegaly, no masses Heart: RRR, normal S1, S2, no murmurs Lungs: CTA bilaterally, no wheezes, rhonchi, or rales Pulses: 2+ symmetric, upper and lower extremities, normal cap refill Ext: no edema Foot exam:  Not examined today given her left  foot immobilized.    Lab Review Lab Results  Component Value Date   HGBA1C 6.3 11/13/2011   Lab Results  Component Value Date   CHOL 145 11/13/2011   HDL 48 11/13/2011   LDLCALC 76 11/13/2011   TRIG 103 11/13/2011   CHOLHDL 3.0 11/13/2011   Lab Results  Component Value Date   MICROALBUR 1.08 07/01/2011     Chemistry      Component Value Date/Time   NA 139 11/13/2011 0851   K 4.5 11/13/2011 0851   CL 104 11/13/2011 0851   CO2 26 11/13/2011 0851   BUN 16 11/13/2011 0851   CREATININE 0.87 11/13/2011 0851      Component Value Date/Time   CALCIUM 9.9 11/13/2011 0851   ALKPHOS 91 11/13/2011 0851   AST 14 11/13/2011 0851   ALT 12 11/13/2011 0851   BILITOT 0.5 11/13/2011 0851        Chemistry      Component Value Date/Time   NA 139 11/13/2011 0851   K 4.5 11/13/2011 0851   CL 104 11/13/2011 0851   CO2 26 11/13/2011 0851   BUN 16 11/13/2011 0851   CREATININE 0.87 11/13/2011 0851      Component Value Date/Time   CALCIUM 9.9 11/13/2011 0851   ALKPHOS 91 11/13/2011 0851   AST 14 11/13/2011 0851   ALT 12 11/13/2011 0851   BILITOT 0.5 11/13/2011 0851       Last optometry/ophthalmology exam reviewed from:03/2011    Assessment:   Encounter Diagnoses  Name Primary?  . Type II or unspecified  type diabetes mellitus without mention of complication, not stated as uncontrolled Yes  . Essential hypertension, benign   . Pure hypercholesterolemia   . Adverse effects of medication, initial encounter   . Insomnia   . Unspecified essential hypertension      Plan:     DM type II - last visit 10/13 controlled, however, having more diarrhea with BID dosing of Metformin.  C/t healthy diet, will give recommendations on medication once we see results.  We discussed generic vs name brand vs combo medication.  HTN - complaint, controled  Hypercholesterolemia - labs today, c/t same medication  Adverse effect - loose stools on Metformin generic BID  Insomnia - discussed sleep hygiene, short term  use of Zolpidem prn.  Discussed risks/benefits.  Discussed the fact that she is due soon for mammogram, pap, colonoscopy (last colonoscopy 11 years ago? Per pt).  F/u pending labs.

## 2012-06-16 ENCOUNTER — Encounter: Payer: Self-pay | Admitting: Family Medicine

## 2012-06-16 LAB — MICROALBUMIN / CREATININE URINE RATIO: Microalb, Ur: 0.92 mg/dL (ref 0.00–1.89)

## 2012-06-22 ENCOUNTER — Other Ambulatory Visit: Payer: Self-pay | Admitting: Family Medicine

## 2012-08-03 ENCOUNTER — Other Ambulatory Visit: Payer: Self-pay | Admitting: Family Medicine

## 2012-08-03 ENCOUNTER — Telehealth: Payer: Self-pay | Admitting: Family Medicine

## 2012-08-03 NOTE — Telephone Encounter (Signed)
Patient called and ask about getting a referral to have a mammogram. Is this okay for me to schedule? CLS

## 2012-08-03 NOTE — Telephone Encounter (Signed)
Go ahead and set this up.  Of note, she may want to come in and discuss.  If last several have been normal, she can probably do mammogram every 2 years.   Is she due for pap at this time?  When was last pap?

## 2012-08-04 ENCOUNTER — Other Ambulatory Visit: Payer: Self-pay | Admitting: Medical

## 2012-08-04 DIAGNOSIS — Z1231 Encounter for screening mammogram for malignant neoplasm of breast: Secondary | ICD-10-CM

## 2012-08-04 NOTE — Telephone Encounter (Signed)
Patient is aware of her appointment at the [facility] on July 3,2014 @ 945 am . CLS [phone]

## 2012-08-13 ENCOUNTER — Ambulatory Visit (HOSPITAL_COMMUNITY)
Admission: RE | Admit: 2012-08-13 | Discharge: 2012-08-13 | Disposition: A | Discharge status: Home / Self Care | Payer: No Typology Code available for payment source | Source: Ambulatory Visit | Attending: Medical | Admitting: Medical

## 2012-08-13 DIAGNOSIS — Z1231 Encounter for screening mammogram for malignant neoplasm of breast: Secondary | ICD-10-CM | POA: Insufficient documentation

## 2012-08-19 ENCOUNTER — Other Ambulatory Visit: Payer: Self-pay | Admitting: Medical

## 2012-08-19 DIAGNOSIS — R928 Other abnormal and inconclusive findings on diagnostic imaging of breast: Secondary | ICD-10-CM

## 2012-08-26 ENCOUNTER — Telehealth: Payer: Self-pay | Admitting: Family Medicine

## 2012-08-26 NOTE — Telephone Encounter (Signed)
If she is agreeable switch to affordable glucometer and testing supplies

## 2012-08-26 NOTE — Telephone Encounter (Signed)
DO YOU WANT TO SWITCH? 

## 2012-08-27 NOTE — Telephone Encounter (Signed)
PT IS OK SWITCHING TO FREE STYLE, PLS CALL IN METER, STRIPS & LANCETS THANKS

## 2012-08-28 NOTE — Telephone Encounter (Signed)
I fax over a RX to her pharmacy. CLS

## 2012-08-31 ENCOUNTER — Other Ambulatory Visit: Payer: Self-pay | Admitting: Medical

## 2012-08-31 ENCOUNTER — Ambulatory Visit
Admission: RE | Admit: 2012-08-31 | Discharge: 2012-08-31 | Disposition: A | Discharge status: Home / Self Care | Payer: No Typology Code available for payment source | Source: Ambulatory Visit | Attending: Medical | Admitting: Medical

## 2012-08-31 DIAGNOSIS — R928 Other abnormal and inconclusive findings on diagnostic imaging of breast: Secondary | ICD-10-CM

## 2012-09-15 ENCOUNTER — Ambulatory Visit
Admission: RE | Admit: 2012-09-15 | Discharge: 2012-09-15 | Disposition: A | Discharge status: Home / Self Care | Payer: No Typology Code available for payment source | Source: Ambulatory Visit | Attending: Medical | Admitting: Medical

## 2012-09-15 DIAGNOSIS — R928 Other abnormal and inconclusive findings on diagnostic imaging of breast: Secondary | ICD-10-CM

## 2012-09-24 ENCOUNTER — Ambulatory Visit (INDEPENDENT_AMBULATORY_CARE_PROVIDER_SITE_OTHER): Payer: No Typology Code available for payment source | Admitting: Surgery

## 2012-09-24 ENCOUNTER — Encounter (INDEPENDENT_AMBULATORY_CARE_PROVIDER_SITE_OTHER): Payer: Self-pay | Admitting: Surgery

## 2012-09-24 VITALS — BP 122/80 | HR 74 | Resp 16 | Ht 62.0 in | Wt 222.4 lb

## 2012-09-24 DIAGNOSIS — N63 Unspecified lump in unspecified breast: Secondary | ICD-10-CM

## 2012-09-24 NOTE — Progress Notes (Signed)
Patient ID: Darlene Wade, female   DOB: Jun 24, 1955, 57 y.o.   MRN: [id number]  Chief Complaint  Patient presents with  . New Evaluation    eval lt breast [facility]    HPI Darlene Wade is a 57 y.o. female.  Referred by Dr. Mayford Knife for evaluation of left breast mass  HPI This is a 57 year old female who recently underwent a routine screening mammogram. This showed a suspicious finding in the left inner lower quadrant. She underwent magnification views as well as ultrasound Which showed a complex cystic mass at 8:00 in the left breast located 7 cm from the nipple. The solid component measures 6 x 9 x 5 mm. She underwent ultrasound-guided core biopsy this area which showed only fibrocystic changes. These findings were felt to be discordant so she is now referred for surgical excision.   Past Medical History  Diagnosis Date  . Hypertension   . Hyperlipidemia   . Impaired fasting glucose   . Elbow fracture, right 2007  . Smoker   . Diabetes mellitus without complication     Past Surgical History  Procedure Laterality Date  . Partial hysterectomy  age 11    uterine fibroids  . Hand surgery    . Foot mass excision      Family History  Problem Relation Age of Onset  . Diabetes Mother   . Hypertension Mother   . Diabetes Maternal Aunt   . Heart disease Maternal Grandfather   . Cancer Neg Hx     Social History History  Substance Use Topics  . Smoking status: Former Smoker    Quit date: 04/11/2009  . Smokeless tobacco: Never Used  . Alcohol Use: Yes     Comment: twice a year    Allergies  Allergen Reactions  . Naproxen Nausea Only  . Vicodin [Hydrocodone-Acetaminophen] Nausea Only  She can take tramadol for pain.  Current Outpatient Prescriptions  Medication Sig Dispense Refill  . amLODipine (NORVASC) 5 MG tablet Take 1 tablet (5 mg total) by mouth daily.  30 tablet  5  . atorvastatin (LIPITOR) 40 MG tablet Take 1 tablet (40 mg total) by mouth daily.  30 tablet  5  . Blood  Glucose Monitoring Suppl (FREESTYLE LITE) DEVI       . glucose blood test strip As directed  50 each  prn  . Lancets (FREESTYLE) lancets       . losartan (COZAAR) 50 MG tablet Take 1 tablet (50 mg total) by mouth daily.  30 tablet  5  . losartan (COZAAR) 50 MG tablet TAKE 1 TABLET BY MOUTH DAILY  30 tablet  11  . metFORMIN (GLUCOPHAGE) 500 MG tablet Take 1 tablet (500 mg total) by mouth 2 (two) times daily with a meal.  180 tablet  1  . traMADol (ULTRAM) 50 MG tablet Take 50 mg by mouth every 6 (six) hours as needed.      . zolpidem (AMBIEN) 10 MG tablet Take 1 tablet (10 mg total) by mouth at bedtime as needed for sleep.  15 tablet  1   No current facility-administered medications for this visit.    Review of Systems Review of Systems  Constitutional: Negative for fever, chills and unexpected weight change.  HENT: Negative for hearing loss, congestion, sore throat, trouble swallowing and voice change.   Eyes: Negative for visual disturbance.  Respiratory: Negative for cough and wheezing.   Cardiovascular: Negative for chest pain, palpitations and leg swelling.  Gastrointestinal: Negative for nausea, vomiting, abdominal  pain, diarrhea, constipation, blood in stool, abdominal distention and anal bleeding.  Genitourinary: Negative for hematuria, vaginal bleeding and difficulty urinating.  Musculoskeletal: Negative for arthralgias.  Skin: Negative for rash and wound.  Neurological: Negative for seizures, syncope and headaches.  Hematological: Negative for adenopathy. Does not bruise/bleed easily.  Psychiatric/Behavioral: Negative for confusion.   Menarche - age 61 First pregnancy - age 29 Breastfeed - no OCP - 8 years Menstrual periods up until hysterectomy age 9 Negative FH  Blood pressure 122/80, pulse 74, resp. rate 16, height 5\' 2"  (1.575 m), weight 222 lb 6.4 oz (100.88 kg).  Physical Exam Physical Exam WDWN in NAD HEENT:  EOMI, sclera anicteric Neck:  No masses, no  thyromegaly Lungs:  CTA bilaterally; normal respiratory effort CV:  Regular rate and rhythm; no murmurs Abd:  +bowel sounds, soft, non-tender, no masses Ext:  Well-perfused; no edema Skin:  Warm, dry; no sign of jaundice Breasts - pendulous breasts - symmetric; no right breast masses; no lymphadenopathy on either side; 2.5 cm hard palpable mass in left lower inner quadrant - probably mostly hematoma from biopsy  Data Reviewed Clinical Data: Screening.  DIGITAL SCREENING BILATERAL MAMMOGRAM WITH CAD  Comparison: Previous exam(s).  FINDINGS:  ACR Breast Density Category a: The breasts are almost entirely  fatty.  In the left breast, a possible mass warrants further evaluation  with spot compression views and possibly ultrasound. In the right  breast, no masses or malignant type calcifications are identified.  Images were processed with CAD.  IMPRESSION:  Further evaluation is suggested for possible mass in the left  breast.  RECOMMENDATION:  Diagnostic mammogram and possibly ultrasound of the left breast.  (Code:FI-L-10M)  The patient will be contacted regarding the findings, and  additional imaging will be scheduled.  BI-RADS CATEGORY 0: Incomplete. Need additional imaging  evaluation and/or prior mammograms for comparison.  Original Report Authenticated By: Baird Lyons, M.D.   *RADIOLOGY REPORT*  Clinical Data: The patient returns for evaluation of a possible  mass in the left lower inner quadrant noted on recent screening  study dated 08/13/2012.  DIGITAL DIAGNOSTIC LEFT LIMITED MAMMOGRAM AND LEFT BREAST  ULTRASOUND:  Comparison: 08/09/2010, 07/31/2009  Findings:  ACR Breast Density Category b: There are scattered areas of  fibroglandular density.  Additional views confirm the presence of a an oval mass in the left  anterior lower inner quadrant.  On physical exam, no mass is palpated in the left breast.  Ultrasound is performed, showing a complex cystic mass at 8 o'clock   7 cm from the left nipple. There is a solid component measuring  approximately 6 x 9 x 5 mm. This may represent a papillary lesion.  Ultrasound-guided core needle biopsy of the solid component is  suggested.  IMPRESSION:  Complex cystic mass at 8 o'clock 7 cm from the left nipple.  RECOMMENDATION:  Ultrasound-guided core needle biopsy is suggested and has been  scheduled for 09/15/2012 at 3:00 p.m.  I have discussed the findings and recommendations with the patient.  Results were also provided in writing at the conclusion of the  visit. If applicable, a reminder letter will be sent to the  patient regarding the next appointment.  BI-RADS CATEGORY 4: Suspicious abnormality - biopsy should be  considered.  Original Report Authenticated By: Cain Saupe, M.D.   Clinical Data: Ultrasound guided biopsy of a complex cystic and  solid mass in the 8 o'clock position of the left breast was  performed.  DIGITAL DIAGNOSTIC LEFT MAMMOGRAM  Comparison: Previous exams.  Findings: Films are performed following ultrasound guided biopsy  of the solid component of a solid and cystic mass. A top hat-  shaped biopsy clip is satisfactorily positioned within the mass.  IMPRESSION:  Satisfactory position of the biopsy clip in the left breast.  Original Report Authenticated By: Britta Mccreedy, M.D.   Stoney Bang Sr., MD Wed Sep 16, 2012 11:05:22 AM EDT       **ADDENDUM** CREATED: 09/16/2012 11:01:40  The pathology associated with the left breast ultrasound guided  core biopsy demonstrated fibrocystic change. On review of the  imaging findings, this is may be discordant (possible papillary  neoplasm). As a result, surgical excision is recommended. I have  discussed the findings with the patient and answered her questions.  The patient states her biopsy site is clean and dry without  hematoma formation. Post biopsy wound care instructions were  reviewed with the patient. The patient has been  scheduled to see  Dr. Corliss Skains on 09/24/2012 at 10:50 a.m. The patient was encouraged  called [facility] for additional questions or concerns.  **END ADDENDUM** SIGNED BY: Rolla Plate, M.D.   Assessment    Left breast mass - biopsy discordant with imaging findings    Plan    Left needle-localized lumpectomy - The surgical procedure has been discussed with the patient.  Potential risks, benefits, alternative treatments, and expected outcomes have been explained.  All of the patient's questions at this time have been answered.  The likelihood of reaching the patient's treatment goal is good.  The patient understand the proposed surgical procedure and wishes to proceed.         Gerhardt Gleed K. 09/24/2012, 11:38 AM

## 2012-10-01 ENCOUNTER — Encounter (HOSPITAL_BASED_OUTPATIENT_CLINIC_OR_DEPARTMENT_OTHER): Payer: Self-pay | Admitting: *Deleted

## 2012-10-01 NOTE — Progress Notes (Signed)
To come in for bmet-ekg-no cardiac problems-does snore some, but had a sleep study several yr ago-did not need cpap-does not remember where

## 2012-10-02 ENCOUNTER — Encounter (HOSPITAL_BASED_OUTPATIENT_CLINIC_OR_DEPARTMENT_OTHER)
Admission: RE | Admit: 2012-10-02 | Discharge: 2012-10-02 | Disposition: A | Discharge status: Home / Self Care | Payer: No Typology Code available for payment source | Source: Ambulatory Visit | Attending: Surgery | Admitting: Surgery

## 2012-10-02 DIAGNOSIS — Z01812 Encounter for preprocedural laboratory examination: Secondary | ICD-10-CM | POA: Insufficient documentation

## 2012-10-02 DIAGNOSIS — Z01810 Encounter for preprocedural cardiovascular examination: Secondary | ICD-10-CM | POA: Insufficient documentation

## 2012-10-02 DIAGNOSIS — Z01818 Encounter for other preprocedural examination: Secondary | ICD-10-CM | POA: Insufficient documentation

## 2012-10-02 LAB — BASIC METABOLIC PANEL
CO2: 27 mEq/L (ref 19–32)
Calcium: 9.5 mg/dL (ref 8.4–10.5)
Glucose, Bld: 98 mg/dL (ref 70–99)
Sodium: 141 mEq/L (ref 135–145)

## 2012-10-07 ENCOUNTER — Ambulatory Visit (HOSPITAL_BASED_OUTPATIENT_CLINIC_OR_DEPARTMENT_OTHER): Payer: No Typology Code available for payment source | Admitting: Anesthesiology

## 2012-10-07 ENCOUNTER — Encounter (HOSPITAL_BASED_OUTPATIENT_CLINIC_OR_DEPARTMENT_OTHER)
Admission: RE | Disposition: A | Discharge status: Home / Self Care | Payer: Self-pay | Source: Ambulatory Visit | Attending: Surgery

## 2012-10-07 ENCOUNTER — Ambulatory Visit (HOSPITAL_BASED_OUTPATIENT_CLINIC_OR_DEPARTMENT_OTHER)
Admission: RE | Admit: 2012-10-07 | Discharge: 2012-10-07 | Disposition: A | Discharge status: Home / Self Care | Payer: No Typology Code available for payment source | Source: Ambulatory Visit | Attending: Surgery | Admitting: Surgery

## 2012-10-07 ENCOUNTER — Encounter (HOSPITAL_BASED_OUTPATIENT_CLINIC_OR_DEPARTMENT_OTHER): Payer: Self-pay | Admitting: Anesthesiology

## 2012-10-07 ENCOUNTER — Encounter (HOSPITAL_BASED_OUTPATIENT_CLINIC_OR_DEPARTMENT_OTHER): Payer: Self-pay | Admitting: *Deleted

## 2012-10-07 ENCOUNTER — Ambulatory Visit
Admission: RE | Admit: 2012-10-07 | Discharge: 2012-10-07 | Disposition: A | Discharge status: Home / Self Care | Payer: No Typology Code available for payment source | Source: Ambulatory Visit | Attending: Surgery | Admitting: Surgery

## 2012-10-07 DIAGNOSIS — D249 Benign neoplasm of unspecified breast: Secondary | ICD-10-CM

## 2012-10-07 DIAGNOSIS — N63 Unspecified lump in unspecified breast: Secondary | ICD-10-CM

## 2012-10-07 DIAGNOSIS — E119 Type 2 diabetes mellitus without complications: Secondary | ICD-10-CM | POA: Insufficient documentation

## 2012-10-07 DIAGNOSIS — I10 Essential (primary) hypertension: Secondary | ICD-10-CM | POA: Insufficient documentation

## 2012-10-07 HISTORY — DX: Presence of spectacles and contact lenses: Z97.3

## 2012-10-07 HISTORY — PX: BREAST LUMPECTOMY WITH NEEDLE LOCALIZATION: SHX5759

## 2012-10-07 SURGERY — BREAST LUMPECTOMY WITH NEEDLE LOCALIZATION
Anesthesia: General | Site: Breast | Laterality: Left | Wound class: Clean

## 2012-10-07 MED ORDER — PROMETHAZINE HCL 12.5 MG PO TABS: 12.5000 mg | ORAL_TABLET | Freq: Four times a day (QID) | ORAL | Status: DC | PRN

## 2012-10-07 MED ORDER — BUPIVACAINE-EPINEPHRINE 0.25% -1:200000 IJ SOLN
INTRAMUSCULAR | Status: DC | PRN
  Administered 2012-10-07: 10 mL

## 2012-10-07 MED ORDER — FENTANYL CITRATE 0.05 MG/ML IJ SOLN: 50.0000 ug | INTRAMUSCULAR | Status: DC | PRN

## 2012-10-07 MED ORDER — ONDANSETRON HCL 4 MG/2ML IJ SOLN: 4.0000 mg | Freq: Once | INTRAMUSCULAR | Status: DC | PRN

## 2012-10-07 MED ORDER — CHLORHEXIDINE GLUCONATE 4 % EX LIQD: 1.0000 "application " | Freq: Once | CUTANEOUS | Status: DC

## 2012-10-07 MED ORDER — ONDANSETRON HCL 4 MG/2ML IJ SOLN: 4.0000 mg | INTRAMUSCULAR | Status: DC | PRN

## 2012-10-07 MED ORDER — EPHEDRINE SULFATE 50 MG/ML IJ SOLN
INTRAMUSCULAR | Status: DC | PRN
  Administered 2012-10-07: 10 mg via INTRAVENOUS

## 2012-10-07 MED ORDER — CEFAZOLIN SODIUM-DEXTROSE 2-3 GM-% IV SOLR
2.0000 g | INTRAVENOUS | Status: AC
  Administered 2012-10-07: 2 g via INTRAVENOUS

## 2012-10-07 MED ORDER — DEXAMETHASONE SODIUM PHOSPHATE 4 MG/ML IJ SOLN
INTRAMUSCULAR | Status: DC | PRN
  Administered 2012-10-07: 10 mg via INTRAVENOUS

## 2012-10-07 MED ORDER — TRAMADOL HCL 50 MG PO TABS: 100.0000 mg | ORAL_TABLET | Freq: Four times a day (QID) | ORAL | Status: DC | PRN

## 2012-10-07 MED ORDER — FENTANYL CITRATE 0.05 MG/ML IJ SOLN
INTRAMUSCULAR | Status: DC | PRN
  Administered 2012-10-07 (×2): 50 ug via INTRAVENOUS

## 2012-10-07 MED ORDER — LACTATED RINGERS IV SOLN
INTRAVENOUS | Status: DC
  Administered 2012-10-07 (×2): via INTRAVENOUS

## 2012-10-07 MED ORDER — OXYCODONE HCL 5 MG PO TABS: 5.0000 mg | ORAL_TABLET | Freq: Once | ORAL | Status: DC | PRN

## 2012-10-07 MED ORDER — LIDOCAINE HCL (CARDIAC) 20 MG/ML IV SOLN
INTRAVENOUS | Status: DC | PRN
  Administered 2012-10-07: 100 mg via INTRAVENOUS

## 2012-10-07 MED ORDER — PROPOFOL 10 MG/ML IV BOLUS
INTRAVENOUS | Status: DC | PRN
  Administered 2012-10-07: 200 mg via INTRAVENOUS

## 2012-10-07 MED ORDER — OXYCODONE HCL 5 MG/5ML PO SOLN: 5.0000 mg | Freq: Once | ORAL | Status: DC | PRN

## 2012-10-07 MED ORDER — MIDAZOLAM HCL 2 MG/2ML IJ SOLN: 1.0000 mg | INTRAMUSCULAR | Status: DC | PRN

## 2012-10-07 MED ORDER — TRAMADOL HCL 50 MG PO TABS: 50.0000 mg | ORAL_TABLET | Freq: Four times a day (QID) | ORAL | Status: DC | PRN

## 2012-10-07 MED ORDER — MIDAZOLAM HCL 5 MG/5ML IJ SOLN
INTRAMUSCULAR | Status: DC | PRN
  Administered 2012-10-07: 2 mg via INTRAVENOUS

## 2012-10-07 MED ORDER — MORPHINE SULFATE 2 MG/ML IJ SOLN: 2.0000 mg | INTRAMUSCULAR | Status: DC | PRN

## 2012-10-07 MED ORDER — HYDROMORPHONE HCL PF 1 MG/ML IJ SOLN
0.2500 mg | INTRAMUSCULAR | Status: DC | PRN
  Administered 2012-10-07 (×2): 0.5 mg via INTRAVENOUS

## 2012-10-07 SURGICAL SUPPLY — 44 items
APL SKNCLS STERI-STRIP NONHPOA (GAUZE/BANDAGES/DRESSINGS) ×1
APPLICATOR COTTON TIP 6IN STRL (MISCELLANEOUS) IMPLANT
BENZOIN TINCTURE PRP APPL 2/3 (GAUZE/BANDAGES/DRESSINGS) ×2 IMPLANT
BLADE HEX COATED 2.75 (ELECTRODE) ×2 IMPLANT
BLADE SURG 15 STRL LF DISP TIS (BLADE) ×1 IMPLANT
BLADE SURG 15 STRL SS (BLADE) ×2
CANISTER SUCTION 1200CC (MISCELLANEOUS) ×1 IMPLANT
CHLORAPREP W/TINT 26ML (MISCELLANEOUS) ×2 IMPLANT
CLOTH BEACON ORANGE TIMEOUT ST (SAFETY) ×2 IMPLANT
COVER MAYO STAND STRL (DRAPES) ×2 IMPLANT
COVER TABLE BACK 60X90 (DRAPES) ×2 IMPLANT
DECANTER SPIKE VIAL GLASS SM (MISCELLANEOUS) ×1 IMPLANT
DEVICE DUBIN W/COMP PLATE 8390 (MISCELLANEOUS) IMPLANT
DRAPE PED LAPAROTOMY (DRAPES) ×2 IMPLANT
DRAPE UTILITY XL STRL (DRAPES) ×2 IMPLANT
DRSG TEGADERM 4X4.75 (GAUZE/BANDAGES/DRESSINGS) ×2 IMPLANT
ELECT BLADE 4.0 EZ CLEAN MEGAD (MISCELLANEOUS) ×2
ELECT REM PT RETURN 9FT ADLT (ELECTROSURGICAL) ×2
ELECTRODE BLDE 4.0 EZ CLN MEGD (MISCELLANEOUS) IMPLANT
ELECTRODE REM PT RTRN 9FT ADLT (ELECTROSURGICAL) ×1 IMPLANT
GAUZE SPONGE 4X4 12PLY STRL LF (GAUZE/BANDAGES/DRESSINGS) ×1 IMPLANT
GLOVE BIO SURGEON STRL SZ7 (GLOVE) ×3 IMPLANT
GLOVE BIOGEL PI IND STRL 7.5 (GLOVE) ×1 IMPLANT
GLOVE BIOGEL PI INDICATOR 7.5 (GLOVE) ×1
GOWN PREVENTION PLUS XLARGE (GOWN DISPOSABLE) ×3 IMPLANT
KIT MARKER MARGIN INK (KITS) ×1 IMPLANT
NDL HYPO 25X1 1.5 SAFETY (NEEDLE) ×1 IMPLANT
NEEDLE HYPO 25X1 1.5 SAFETY (NEEDLE) ×2 IMPLANT
NS IRRIG 1000ML POUR BTL (IV SOLUTION) ×1 IMPLANT
PACK BASIN DAY SURGERY FS (CUSTOM PROCEDURE TRAY) ×2 IMPLANT
PENCIL BUTTON HOLSTER BLD 10FT (ELECTRODE) ×2 IMPLANT
SLEEVE SCD COMPRESS KNEE MED (MISCELLANEOUS) ×2 IMPLANT
SPONGE LAP 4X18 X RAY DECT (DISPOSABLE) ×2 IMPLANT
STRIP CLOSURE SKIN 1/2X4 (GAUZE/BANDAGES/DRESSINGS) ×2 IMPLANT
SUT CHROMIC 3 0 SH 27 (SUTURE) IMPLANT
SUT MON AB 4-0 PC3 18 (SUTURE) ×2 IMPLANT
SUT SILK 2 0 SH (SUTURE) IMPLANT
SUT VIC AB 3-0 SH 27 (SUTURE) ×2
SUT VIC AB 3-0 SH 27X BRD (SUTURE) ×1 IMPLANT
SYR CONTROL 10ML LL (SYRINGE) ×2 IMPLANT
TOWEL OR 17X24 6PK STRL BLUE (TOWEL DISPOSABLE) ×3 IMPLANT
TOWEL OR NON WOVEN STRL DISP B (DISPOSABLE) ×1 IMPLANT
TUBE CONNECTING 20X1/4 (TUBING) ×1 IMPLANT
YANKAUER SUCT BULB TIP NO VENT (SUCTIONS) ×1 IMPLANT

## 2012-10-07 NOTE — Anesthesia Preprocedure Evaluation (Signed)
Anesthesia Evaluation  Patient identified by MRN, date of birth, ID band Patient awake    Reviewed: Allergy & Precautions, H&P , NPO status , Patient's Chart, lab work & pertinent test results  Airway Mallampati: I TM Distance: >3 FB Neck ROM: Full    Dental  (+) Teeth Intact and Dental Advisory Given   Pulmonary  breath sounds clear to auscultation        Cardiovascular hypertension, Pt. on medications Rhythm:Regular Rate:Normal     Neuro/Psych    GI/Hepatic   Endo/Other  diabetes, Well Controlled, Type 2, Oral Hypoglycemic AgentsMorbid obesity  Renal/GU      Musculoskeletal   Abdominal   Peds  Hematology   Anesthesia Other Findings   Reproductive/Obstetrics                          Anesthesia Physical Anesthesia Plan  ASA: III  Anesthesia Plan: General   Post-op Pain Management:    Induction: Intravenous  Airway Management Planned: LMA  Additional Equipment:   Intra-op Plan:   Post-operative Plan: Extubation in OR  Informed Consent: I have reviewed the patients History and Physical, chart, labs and discussed the procedure including the risks, benefits and alternatives for the proposed anesthesia with the patient or authorized representative who has indicated his/her understanding and acceptance.   Dental advisory given  Plan Discussed with: CRNA, Anesthesiologist and Surgeon  Anesthesia Plan Comments:         Anesthesia Quick Evaluation  

## 2012-10-07 NOTE — Anesthesia Postprocedure Evaluation (Signed)
  Anesthesia Post-op Note  Patient: Darlene Wade  Procedure(s) Performed: Procedure(s): BREAST LUMPECTOMY WITH NEEDLE LOCALIZATION (Left)  Patient Location: PACU  Anesthesia Type:General  Level of Consciousness: awake, alert  and oriented  Airway and Oxygen Therapy: Patient Spontanous Breathing and Patient connected to face mask oxygen  Post-op Pain: mild  Post-op Assessment: Post-op Vital signs reviewed  Post-op Vital Signs: Reviewed  Complications: No apparent anesthesia complications

## 2012-10-07 NOTE — Anesthesia Procedure Notes (Signed)
Procedure Name: LMA Insertion Date/Time: 10/07/2012 12:21 PM Performed by: Gar Gibbon Pre-anesthesia Checklist: Patient identified, Emergency Drugs available, Suction available and Patient being monitored Patient Re-evaluated:Patient Re-evaluated prior to inductionOxygen Delivery Method: Circle System Utilized Preoxygenation: Pre-oxygenation with 100% oxygen Intubation Type: IV induction Ventilation: Mask ventilation without difficulty LMA: LMA inserted LMA Size: 4.0 Number of attempts: 1 Airway Equipment and Method: bite block Placement Confirmation: positive ETCO2 Tube secured with: Tape Dental Injury: Teeth and Oropharynx as per pre-operative assessment

## 2012-10-07 NOTE — H&P (View-Only) (Signed)
Patient ID: Darlene Wade, female   DOB: 07/11/1955, 57 y.o.   MRN: [id number]  Chief Complaint  Patient presents with  . New Evaluation    eval lt breast FCC    HPI Darlene Wade is a 57 y.o. female.  Referred by Dr. Turner for evaluation of left breast mass  HPI This is a 57-year-old female who recently underwent a routine screening mammogram. This showed a suspicious finding in the left inner lower quadrant. She underwent magnification views as well as ultrasound Which showed a complex cystic mass at 8:00 in the left breast located 7 cm from the nipple. The solid component measures 6 x 9 x 5 mm. She underwent ultrasound-guided core biopsy this area which showed only fibrocystic changes. These findings were felt to be discordant so she is now referred for surgical excision.   Past Medical History  Diagnosis Date  . Hypertension   . Hyperlipidemia   . Impaired fasting glucose   . Elbow fracture, right 2007  . Smoker   . Diabetes mellitus without complication     Past Surgical History  Procedure Laterality Date  . Partial hysterectomy  age 28    uterine fibroids  . Hand surgery    . Foot mass excision      Family History  Problem Relation Age of Onset  . Diabetes Mother   . Hypertension Mother   . Diabetes Maternal Aunt   . Heart disease Maternal Grandfather   . Cancer Neg Hx     Social History History  Substance Use Topics  . Smoking status: Former Smoker    Quit date: 04/11/2009  . Smokeless tobacco: Never Used  . Alcohol Use: Yes     Comment: twice a year    Allergies  Allergen Reactions  . Naproxen Nausea Only  . Vicodin [Hydrocodone-Acetaminophen] Nausea Only  She can take tramadol for pain.  Current Outpatient Prescriptions  Medication Sig Dispense Refill  . amLODipine (NORVASC) 5 MG tablet Take 1 tablet (5 mg total) by mouth daily.  30 tablet  5  . atorvastatin (LIPITOR) 40 MG tablet Take 1 tablet (40 mg total) by mouth daily.  30 tablet  5  . Blood  Glucose Monitoring Suppl (FREESTYLE LITE) DEVI       . glucose blood test strip As directed  50 each  prn  . Lancets (FREESTYLE) lancets       . losartan (COZAAR) 50 MG tablet Take 1 tablet (50 mg total) by mouth daily.  30 tablet  5  . losartan (COZAAR) 50 MG tablet TAKE 1 TABLET BY MOUTH DAILY  30 tablet  11  . metFORMIN (GLUCOPHAGE) 500 MG tablet Take 1 tablet (500 mg total) by mouth 2 (two) times daily with a meal.  180 tablet  1  . traMADol (ULTRAM) 50 MG tablet Take 50 mg by mouth every 6 (six) hours as needed.      . zolpidem (AMBIEN) 10 MG tablet Take 1 tablet (10 mg total) by mouth at bedtime as needed for sleep.  15 tablet  1   No current facility-administered medications for this visit.    Review of Systems Review of Systems  Constitutional: Negative for fever, chills and unexpected weight change.  HENT: Negative for hearing loss, congestion, sore throat, trouble swallowing and voice change.   Eyes: Negative for visual disturbance.  Respiratory: Negative for cough and wheezing.   Cardiovascular: Negative for chest pain, palpitations and leg swelling.  Gastrointestinal: Negative for nausea, vomiting, abdominal   pain, diarrhea, constipation, blood in stool, abdominal distention and anal bleeding.  Genitourinary: Negative for hematuria, vaginal bleeding and difficulty urinating.  Musculoskeletal: Negative for arthralgias.  Skin: Negative for rash and wound.  Neurological: Negative for seizures, syncope and headaches.  Hematological: Negative for adenopathy. Does not bruise/bleed easily.  Psychiatric/Behavioral: Negative for confusion.   Menarche - age 11 First pregnancy - age 17 Breastfeed - no OCP - 8 years Menstrual periods up until hysterectomy age 28 Negative FH  Blood pressure 122/80, pulse 74, resp. rate 16, height 5' 2" (1.575 m), weight 222 lb 6.4 oz (100.88 kg).  Physical Exam Physical Exam WDWN in NAD HEENT:  EOMI, sclera anicteric Neck:  No masses, no  thyromegaly Lungs:  CTA bilaterally; normal respiratory effort CV:  Regular rate and rhythm; no murmurs Abd:  +bowel sounds, soft, non-tender, no masses Ext:  Well-perfused; no edema Skin:  Warm, dry; no sign of jaundice Breasts - pendulous breasts - symmetric; no right breast masses; no lymphadenopathy on either side; 2.5 cm hard palpable mass in left lower inner quadrant - probably mostly hematoma from biopsy  Data Reviewed Clinical Data: Screening.  DIGITAL SCREENING BILATERAL MAMMOGRAM WITH CAD  Comparison: Previous exam(s).  FINDINGS:  ACR Breast Density Category a: The breasts are almost entirely  fatty.  In the left breast, a possible mass warrants further evaluation  with spot compression views and possibly ultrasound. In the right  breast, no masses or malignant type calcifications are identified.  Images were processed with CAD.  IMPRESSION:  Further evaluation is suggested for possible mass in the left  breast.  RECOMMENDATION:  Diagnostic mammogram and possibly ultrasound of the left breast.  (Code:[id number]-00M)  The patient will be contacted regarding the findings, and  additional imaging will be scheduled.  BI-RADS CATEGORY 0: Incomplete. Need additional imaging  evaluation and/or prior mammograms for comparison.  Original Report Authenticated By: Dina Arceo, M.D.   *RADIOLOGY REPORT*  Clinical Data: The patient returns for evaluation of a possible  mass in the left lower inner quadrant noted on recent screening  study dated 08/13/2012.  DIGITAL DIAGNOSTIC LEFT LIMITED MAMMOGRAM AND LEFT BREAST  ULTRASOUND:  Comparison: 08/09/2010, 07/31/2009  Findings:  ACR Breast Density Category b: There are scattered areas of  fibroglandular density.  Additional views confirm the presence of a an oval mass in the left  anterior lower inner quadrant.  On physical exam, no mass is palpated in the left breast.  Ultrasound is performed, showing a complex cystic mass at 8 o'clock   7 cm from the left nipple. There is a solid component measuring  approximately 6 x 9 x 5 mm. This may represent a papillary lesion.  Ultrasound-guided core needle biopsy of the solid component is  suggested.  IMPRESSION:  Complex cystic mass at 8 o'clock 7 cm from the left nipple.  RECOMMENDATION:  Ultrasound-guided core needle biopsy is suggested and has been  scheduled for 09/15/2012 at 3:00 p.m.  I have discussed the findings and recommendations with the patient.  Results were also provided in writing at the conclusion of the  visit. If applicable, a reminder letter will be sent to the  patient regarding the next appointment.  BI-RADS CATEGORY 4: Suspicious abnormality - biopsy should be  considered.  Original Report Authenticated By: Elizabeth Eagle, M.D.   Clinical Data: Ultrasound guided biopsy of a complex cystic and  solid mass in the 8 o'clock position of the left breast was  performed.  DIGITAL DIAGNOSTIC LEFT MAMMOGRAM    Comparison: Previous exams.  Findings: Films are performed following ultrasound guided biopsy  of the solid component of a solid and cystic mass. A top hat-  shaped biopsy clip is satisfactorily positioned within the mass.  IMPRESSION:  Satisfactory position of the biopsy clip in the left breast.  Original Report Authenticated By: Susan Turner, M.D.   F. [place] Jackson Sr., MD Wed Sep 16, 2012 11:05:22 AM EDT       **ADDENDUM** CREATED: 09/16/2012 11:01:40  The pathology associated with the left breast ultrasound guided  core biopsy demonstrated fibrocystic change. On review of the  imaging findings, this is may be discordant (possible papillary  neoplasm). As a result, surgical excision is recommended. I have  discussed the findings with the patient and answered her questions.  The patient states her biopsy site is clean and dry without  hematoma formation. Post biopsy wound care instructions were  reviewed with the patient. The patient has been  scheduled to see  Dr. Krishna Dancel on 09/24/2012 at 10:50 a.m. The patient was encouraged  called [facility] for additional questions or concerns.  **END ADDENDUM** SIGNED BY: Noxon Jackson, M.D.   Assessment    Left breast mass - biopsy discordant with imaging findings    Plan    Left needle-localized lumpectomy - The surgical procedure has been discussed with the patient.  Potential risks, benefits, alternative treatments, and expected outcomes have been explained.  All of the patient's questions at this time have been answered.  The likelihood of reaching the patient's treatment goal is good.  The patient understand the proposed surgical procedure and wishes to proceed.         Aurel Nguyen K. 09/24/2012, 11:38 AM    

## 2012-10-07 NOTE — Interval H&P Note (Signed)
History and Physical Interval Note:  10/07/2012 11:53 AM  Darlene Wade  has presented today for surgery, with the diagnosis of left breast mass  The various methods of treatment have been discussed with the patient and family. After consideration of risks, benefits and other options for treatment, the patient has consented to  Procedure(s) with comments: BREAST LUMPECTOMY WITH NEEDLE LOCALIZATION (Left) - needle localization at BCG  as a surgical intervention .  The patient's history has been reviewed, patient examined, no change in status, stable for surgery.  I have reviewed the patient's chart and labs.  Questions were answered to the patient's satisfaction.     [name] K.

## 2012-10-07 NOTE — Transfer of Care (Signed)
Immediate Anesthesia Transfer of Care Note  Patient: Darlene Wade  Procedure(s) Performed: Procedure(s): BREAST LUMPECTOMY WITH NEEDLE LOCALIZATION (Left)  Patient Location: PACU  Anesthesia Type:General  Level of Consciousness: sedated and patient cooperative  Airway & Oxygen Therapy: Patient Spontanous Breathing and Patient connected to face mask oxygen  Post-op Assessment: Report given to PACU RN and Post -op Vital signs reviewed and stable  Post vital signs: Reviewed and stable  Complications: No apparent anesthesia complications

## 2012-10-07 NOTE — Op Note (Signed)
preop diagnosis: Left breast mass Postop diagnosis: Same Procedure performed: left needle localized lumpectomy Surgeon:Terrika Zuver K. Anesthesia: Gen. Via LMA Indications: This is a 57 year old female who recently underwent a routine screening mammogram. She was found to have an abnormal solid and cystic lesion in her left breast. Core biopsy showed only fibrocystic changes but this was felt to be discordant with the mammogram findings. She now presents for needle localized lumpectomy.  Description of procedure: The patient was brought to the operating room and placed in a supine position on the operating room table. After an adequate level of general anesthesia was obtained her left breast was prepped with chloraprep and draped sterile fashion. The wire is in the lower inner quadrant of her breast oriented with a superior orientation.  We made a transverse incision across her left breast to include the wire. The patient has very large pendulous breast. The mass is fairly deep at a depth of 7 cm. We raised skin flaps and to get a large cylinder of tissue around the wire to a depth of 7.5 cm. Cautery was used for hemostasis. We removed the mass and oriented with a paint kit. A specimen mammogram showed that the tip of the wire and the biopsy clip or within the Center of the specimen. We irrigated the biopsy cavity and inspected for hemostasis. The wound was closed with 3-0 Vicryl and a subcuticular layer of 4-0 Monocryl. Steri-Strips and clean dressings were applied. The patient was extubated and brought to the recovery room in stable condition. All sponge, initially, and needle counts are correct. The specimen was sent for pathologic examination.  Wilmon Arms. Corliss Skains, MD, [facility] Surgery  General/ Trauma Surgery  10/07/2012 1:37 PM

## 2012-10-08 ENCOUNTER — Encounter (HOSPITAL_BASED_OUTPATIENT_CLINIC_OR_DEPARTMENT_OTHER): Payer: Self-pay | Admitting: Surgery

## 2012-10-20 ENCOUNTER — Other Ambulatory Visit: Payer: Self-pay | Admitting: Medical

## 2012-10-27 ENCOUNTER — Ambulatory Visit (INDEPENDENT_AMBULATORY_CARE_PROVIDER_SITE_OTHER): Payer: No Typology Code available for payment source | Admitting: Surgery

## 2012-10-27 ENCOUNTER — Encounter (INDEPENDENT_AMBULATORY_CARE_PROVIDER_SITE_OTHER): Payer: Self-pay | Admitting: Surgery

## 2012-10-27 VITALS — BP 114/80 | HR 88 | Temp 98.3°F | Resp 14 | Ht 63.0 in | Wt 223.0 lb

## 2012-10-27 DIAGNOSIS — D249 Benign neoplasm of unspecified breast: Secondary | ICD-10-CM

## 2012-10-27 DIAGNOSIS — D242 Benign neoplasm of left breast: Secondary | ICD-10-CM | POA: Insufficient documentation

## 2012-10-27 NOTE — Progress Notes (Signed)
Status post left lumpectomy on 10/07/12. Her pathology showed fibrocystic changes and intraductal papilloma but no sign of malignancy.  Her incision is healing well no sign of infection. She has some soft seroma underneath her incision but this does not need to be drained. Minimal tenderness. The patient needs no further workup. Continue with annual mammograms. Followup when necessary.  Wilmon Arms. Corliss Skains, MD, [facility] Surgery  General/ Trauma Surgery  10/27/2012 9:13 AM

## 2012-12-17 ENCOUNTER — Other Ambulatory Visit: Payer: Self-pay

## 2012-12-23 ENCOUNTER — Other Ambulatory Visit: Payer: Self-pay | Admitting: Family Medicine

## 2012-12-23 NOTE — Telephone Encounter (Signed)
PATIENT NEEDS TO SCHEDULE A MEDICATION CHECK .

## 2013-01-22 ENCOUNTER — Telehealth: Payer: Self-pay | Admitting: Internal Medicine

## 2013-01-22 NOTE — Telephone Encounter (Signed)
Faxed over medical records to [facility] @ [phone]

## 2013-01-28 DIAGNOSIS — Z0279 Encounter for issue of other medical certificate: Secondary | ICD-10-CM

## 2013-02-11 HISTORY — PX: BREAST CYST ASPIRATION: SHX578

## 2013-03-01 ENCOUNTER — Telehealth: Payer: Self-pay | Admitting: Medical

## 2013-03-02 NOTE — Telephone Encounter (Signed)
Pt has appt in the morning

## 2013-03-02 NOTE — Telephone Encounter (Signed)
She just saw general surgery late last year.   Have her come in first for exam of breasts, discuss concerns, so we can decide next steps.

## 2013-03-03 ENCOUNTER — Encounter: Payer: Self-pay | Admitting: Medical

## 2013-03-03 ENCOUNTER — Ambulatory Visit (INDEPENDENT_AMBULATORY_CARE_PROVIDER_SITE_OTHER): Payer: 59 | Admitting: Medical

## 2013-03-03 ENCOUNTER — Other Ambulatory Visit: Payer: Self-pay | Admitting: Medical

## 2013-03-03 VITALS — BP 112/80 | HR 90 | Temp 98.0°F | Resp 16 | Ht 63.0 in | Wt 224.0 lb

## 2013-03-03 DIAGNOSIS — N63 Unspecified lump in unspecified breast: Secondary | ICD-10-CM

## 2013-03-03 DIAGNOSIS — N644 Mastodynia: Secondary | ICD-10-CM

## 2013-03-03 MED ORDER — CEPHALEXIN 500 MG PO CAPS: 500.0000 mg | ORAL_CAPSULE | Freq: Four times a day (QID) | ORAL | Status: DC

## 2013-03-03 NOTE — Progress Notes (Signed)
Subjective: Here for breast pain.  Few days ago started getting breast soreness out of the blue.  Breast felt lumpy.  Today it hurts.  This is the left breast, which is the same breast that had lumpectomy in 8/14.  In august she had felt lump, had mammogram, and subsequent lumpectomy.   Denies fever, no nausea, no vomiting.   No recent injury to chest or breasts.  No nipple drainage, no recent pulling/manipulation of the breasts.   No redness.  Left breast seems a little swollen.  Right breast is fine.  No other aggravating or relieving factors.  No other c/o.    Allergies  Allergen Reactions  . Naproxen Nausea Only  . Vicodin [Hydrocodone-Acetaminophen] Nausea Only    Current Outpatient Prescriptions on File Prior to Visit  Medication Sig Dispense Refill  . amLODipine (NORVASC) 5 MG tablet Take 1 tablet (5 mg total) by mouth daily.  30 tablet  5  . atorvastatin (LIPITOR) 40 MG tablet Take 1 tablet (40 mg total) by mouth daily.  30 tablet  5  . glucose blood test strip As directed  50 each  prn  . Lancets (FREESTYLE) lancets       . losartan (COZAAR) 50 MG tablet Take 1 tablet (50 mg total) by mouth daily.  30 tablet  5  . metFORMIN (GLUCOPHAGE) 500 MG tablet Take 500 mg by mouth daily with breakfast.      . traMADol (ULTRAM) 50 MG tablet Take 50 mg by mouth every 6 (six) hours as needed.      . Blood Glucose Monitoring Suppl (FREESTYLE LITE) DEVI       . zolpidem (AMBIEN) 10 MG tablet Take 1 tablet (10 mg total) by mouth at bedtime as needed for sleep.  15 tablet  1   No current facility-administered medications on file prior to visit.    Past Medical History  Diagnosis Date  . Hypertension   . Hyperlipidemia   . Impaired fasting glucose   . Elbow fracture, right 2007  . Smoker   . Diabetes mellitus without complication   . Wears glasses     read    Past Surgical History  Procedure Laterality Date  . Partial hysterectomy  age 53    uterine fibroids  . Hand surgery    . Foot  mass excision  3/14    left-fusion  . Colonoscopy    . Foot arthrotomy  11/12    foot fusion  . Tonsillectomy    . Elbow arthroplasty  2006    [id number]  . Breast lumpectomy with needle localization Left 10/07/2012    Procedure: BREAST LUMPECTOMY WITH NEEDLE LOCALIZATION;  Surgeon: Imogene Burn. Georgette Dover, MD;  Location: [place];  Service: General;  Laterality: Left;    Family History  Problem Relation Age of Onset  . Diabetes Mother   . Hypertension Mother   . Diabetes Maternal Aunt   . Heart disease Maternal Grandfather   . Cancer Neg Hx     History   Social History  . Marital Status: Married    Spouse Name: N/A    Number of Children: N/A  . Years of Education: N/A   Occupational History  . Not on file.   Social History Main Topics  . Smoking status: Former Smoker    Quit date: 04/11/2009  . Smokeless tobacco: Never Used  . Alcohol Use: Yes     Comment: twice a year  . Drug Use: No  .  Sexual Activity: Not on file   Other Topics Concern  . Not on file   Social History Narrative   Separated from husband (2013)    Reviewed their medical, surgical, family, social, medication, and allergy history and updated chart as appropriate.  ROS as in subjective   Objective: Gen: wd, wn, nad Skin: mild warmth of left inferior breast Breasts: There is a surgical incision of the left breast inferior to the nipple and areola.  There is an 8+ centimeter area of induration and swelling of the left breast in the 8 o'clock position somewhat adjacent to the prior surgical incision.  The area is tender, suspicious for large cyst vs infection/abscess.   Assessment: Encounter Diagnoses  Name Primary?  . Breast pain, left Yes  . Swollen breast     Plan: Will pursue left breast diagnostic ultrasound.  Unable to schedule today, but got her on schedule for tomorrow morning.  Begin Keflex empirically in the chance that this is a developing infection.  If worse in the  meantime call or return.

## 2013-03-04 LAB — HM MAMMOGRAPHY

## 2013-03-05 ENCOUNTER — Encounter: Payer: Self-pay | Admitting: Internal Medicine

## 2013-03-08 ENCOUNTER — Encounter: Payer: Self-pay | Admitting: Medical

## 2013-03-09 ENCOUNTER — Other Ambulatory Visit: Payer: No Typology Code available for payment source

## 2013-03-12 ENCOUNTER — Ambulatory Visit (INDEPENDENT_AMBULATORY_CARE_PROVIDER_SITE_OTHER): Payer: 59 | Admitting: Surgery

## 2013-03-12 ENCOUNTER — Encounter (INDEPENDENT_AMBULATORY_CARE_PROVIDER_SITE_OTHER): Payer: Self-pay | Admitting: Surgery

## 2013-03-12 ENCOUNTER — Ambulatory Visit (INDEPENDENT_AMBULATORY_CARE_PROVIDER_SITE_OTHER): Payer: 59 | Admitting: Medical

## 2013-03-12 ENCOUNTER — Encounter: Payer: Self-pay | Admitting: Medical

## 2013-03-12 VITALS — BP 118/72 | HR 72 | Temp 97.6°F | Resp 14 | Ht 63.0 in | Wt 224.6 lb

## 2013-03-12 VITALS — BP 100/78 | HR 68 | Temp 98.0°F | Resp 16 | Wt 222.0 lb

## 2013-03-12 DIAGNOSIS — IMO0002 Reserved for concepts with insufficient information to code with codable children: Secondary | ICD-10-CM

## 2013-03-12 DIAGNOSIS — I10 Essential (primary) hypertension: Secondary | ICD-10-CM

## 2013-03-12 DIAGNOSIS — E7800 Pure hypercholesterolemia, unspecified: Secondary | ICD-10-CM

## 2013-03-12 DIAGNOSIS — G4700 Insomnia, unspecified: Secondary | ICD-10-CM

## 2013-03-12 DIAGNOSIS — E119 Type 2 diabetes mellitus without complications: Secondary | ICD-10-CM

## 2013-03-12 LAB — COMPREHENSIVE METABOLIC PANEL
ALT: 15 U/L (ref 0–35)
AST: 13 U/L (ref 0–37)
Albumin: 3.9 g/dL (ref 3.5–5.2)
Alkaline Phosphatase: 81 U/L (ref 39–117)
BILIRUBIN TOTAL: 0.6 mg/dL (ref 0.2–1.2)
BUN: 14 mg/dL (ref 6–23)
CO2: 27 meq/L (ref 19–32)
Calcium: 9.4 mg/dL (ref 8.4–10.5)
Chloride: 100 mEq/L (ref 96–112)
Creat: 0.73 mg/dL (ref 0.50–1.10)
GLUCOSE: 86 mg/dL (ref 70–99)
Potassium: 4 mEq/L (ref 3.5–5.3)
SODIUM: 137 meq/L (ref 135–145)
TOTAL PROTEIN: 7.7 g/dL (ref 6.0–8.3)

## 2013-03-12 LAB — POCT GLYCOSYLATED HEMOGLOBIN (HGB A1C): Hemoglobin A1C: 7.3

## 2013-03-12 MED ORDER — ATORVASTATIN CALCIUM 40 MG PO TABS: 40.0000 mg | ORAL_TABLET | Freq: Every day | ORAL | Status: DC

## 2013-03-12 MED ORDER — TRAZODONE HCL 50 MG PO TABS: 25.0000 mg | ORAL_TABLET | Freq: Every evening | ORAL | Status: DC | PRN

## 2013-03-12 MED ORDER — LOSARTAN POTASSIUM 50 MG PO TABS: 50.0000 mg | ORAL_TABLET | Freq: Every day | ORAL | Status: DC

## 2013-03-12 MED ORDER — METFORMIN HCL 500 MG PO TABS: 500.0000 mg | ORAL_TABLET | Freq: Every day | ORAL | Status: DC

## 2013-03-12 MED ORDER — AMLODIPINE BESYLATE 5 MG PO TABS: 5.0000 mg | ORAL_TABLET | Freq: Every day | ORAL | Status: DC

## 2013-03-12 NOTE — Progress Notes (Signed)
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 At her last visit, she had a small soft seroma under her incision. She has not followed up since September. Over the last several months she developed more swelling and protrusion under her scar. She had an ultrasound performed on 03/04/13 which showed a 7.4 cm fluid collection. There is a small mural nodule measuring 12 mm which is felt to be low suspicion. She presents now for evaluation.  Filed Vitals:   03/12/13 0946  BP: 118/72  Pulse: 72  Temp: 97.6 F (36.4 C)  Resp: 14     Her left breast incision is well healed. She does have some protrusion underneath her incision. We prepped this area with alcohol and anesthetized with 1% lidocaine. I aspirated 70 cc of clear yellow fluid with complete resolution of the protrusion. We will reevaluate her in 2 weeks to see if there has been a reaccumulation of the seroma. She should wear a sports bra for the next several days.  Darlene Wade. Georgette Dover, MD, [facility] Surgery  General/ Trauma Surgery  03/12/2013 1:31 PM

## 2013-03-12 NOTE — Patient Instructions (Signed)
See your eye doctor soon routine eye care and diabetic evaluation  Try and get some form of exercise regularly such as water aerobics, elliptical machine, hand bicycle, treadmill, etc.  Continue your current medications, try and eat a healthy diabetic diet.  He will set you up with [employer] research study for diabetes.

## 2013-03-12 NOTE — Progress Notes (Signed)
  Subjective:    Darlene Wade is a 58 y.o. female who presents for follow-up of Type 2 diabetes mellitus, HTN, hyperlipidemia, insomnia.    Home blood sugar records: 110 to 130, checks morning only  Current symptoms/problems include none and have been stable. Daily foot checks: not really, only at bath time   Any foot concerns: none Last eye exam:  A few years   Medication compliance: Metformin 500mg  once daily Current diet: sometimes good , sometimes not Current exercise: treadmill some Known diabetic complications: none Cardiovascular risk factors: advanced age (older than 23 for men, 49 for women), diabetes mellitus, dyslipidemia, hypertension and obesity (BMI >= 30 kg/m2)  HTN, Hyperlipidemia - compliant with medications without complaint.  Insomnia - she has had problems sleeping since this past year.  Sometimes has problems getting to sleep, but has often a problem staying asleep. Ambien seems to work well but she has to take it every day.  She does snore, sometimes wakes feeling fatigue, sometimes sleepy in the day, but she thinks this is just because she is not getting good sleep. She doesn't suspect sleep apnea  The following portions of the patient's history were reviewed and updated as appropriate: allergies, current medications, past family history, past medical history, past social history, past surgical history and problem list.  ROS as in subjective above    Objective:    BP 100/78  Pulse 68  Temp(Src) 98 F (36.7 C) (Oral)  Resp 16  Wt 222 lb (100.699 kg)   General appearance: alert, no distress, WD/WN Neck: supple, no lymphadenopathy, no thyromegaly, no masses Heart: RRR, normal S1, S2, no murmurs Lungs: CTA bilaterally, no wheezes, rhonchi, or rales Abdomen: +bs, soft, non tender, non distended, no masses, no hepatomegaly, no splenomegaly Pulses: 2+ symmetric, upper and lower extremities, normal cap refill Ext: no edema   Assessment:  Encounter  Diagnoses  Name Primary?  . Type II or unspecified type diabetes mellitus without mention of complication, not stated as uncontrolled Yes  . Unspecified essential hypertension   . Pure hypercholesterolemia   . Insomnia      Plan:   Diabetes type 2- her hemoglobin A1c has bumped up to 7.3%. She has not tolerated metformin all that well due to diarrhea.  Advise she see eye doctor soon since her last visit was 3 years ago. Daily foot checks. We discussed medication options, and she is agreeable to referral to the research study for diabetes which includes medications and counseling.  If for some reason she doesn't qualify, consider switching to Glucophage XR 750 once daily and adding DPPV. Hypertension-controlled on current medicine Hyperlipidemia-controlled on current medicine Insomnia-continue to practice good sleep hygiene, Trileptal trazodone

## 2013-03-16 ENCOUNTER — Telehealth: Payer: Self-pay | Admitting: Family Medicine

## 2013-03-16 NOTE — Telephone Encounter (Signed)
Message copied by Armanda Magic on Tue Mar 16, 2013  9:03 AM ------      Message from: Carlena Hurl      Created: Fri Mar 12, 2013  5:16 PM       Refer to [employer] for diabetes research study.  Let them know she has only been on Metformin, but can't tolerate doses above 500mg  given diarrhea.  I would assume she still qualifies for study given within A1C, age, and BMI range.    ------

## 2013-03-16 NOTE — Telephone Encounter (Signed)
I fax her information over to [facility] per Chana Bode PA-C request. CLS

## 2013-03-22 ENCOUNTER — Telehealth: Payer: Self-pay | Admitting: Medical

## 2013-03-22 NOTE — Telephone Encounter (Signed)
I received notice that she didn't qualify for the [employer] study.  Thus, to help control her diabetes better, I'd like to start her on samples.  I have [facility] XR that I'd like her to begin once daily.  This has extended release metformin which is usually tolerated better, and it has an additional medication called saxagliptin in the same medication that should do better at getting her sugars down.  Have her come in and pick up the samples.  I'd like her to check her morning glucose regularly to see how her glucose improves in teh coming weeks.    If she is agreeable to this, I'll send as prescription too.  F/u 2mo.

## 2013-03-22 NOTE — Telephone Encounter (Signed)
Patient is aware and she will stop by our office to pick up the samples. She will give me a call in a week to let me know how the medication work. CLS

## 2013-03-23 ENCOUNTER — Encounter (INDEPENDENT_AMBULATORY_CARE_PROVIDER_SITE_OTHER): Payer: Self-pay | Admitting: Surgery

## 2013-03-23 ENCOUNTER — Ambulatory Visit (INDEPENDENT_AMBULATORY_CARE_PROVIDER_SITE_OTHER): Payer: 59 | Admitting: Surgery

## 2013-03-23 DIAGNOSIS — D242 Benign neoplasm of left breast: Secondary | ICD-10-CM

## 2013-03-23 DIAGNOSIS — D249 Benign neoplasm of unspecified breast: Secondary | ICD-10-CM

## 2013-03-23 NOTE — Progress Notes (Signed)
She comes in today for recheck of her left breast seroma. She has had slight reaccumulation of fluid. We raised to aspirate 12 cc today with almost complete resolution. She will not likely need any further aspiration. She will return if it swells up again. Followup when necessary.  Darlene Wade. Georgette Dover, MD, [facility] Surgery  General/ Trauma Surgery  03/23/2013 10:04 AM

## 2013-04-13 ENCOUNTER — Encounter (INDEPENDENT_AMBULATORY_CARE_PROVIDER_SITE_OTHER): Payer: Self-pay

## 2013-04-14 ENCOUNTER — Telehealth: Payer: Self-pay | Admitting: Medical

## 2013-04-14 ENCOUNTER — Other Ambulatory Visit: Payer: Self-pay | Admitting: Medical

## 2013-04-14 MED ORDER — SAXAGLIPTIN-METFORMIN ER 5-1000 MG PO TB24: 1.0000 | ORAL_TABLET | Freq: Every day | ORAL | Status: DC

## 2013-04-14 NOTE — Telephone Encounter (Signed)
Called pt to inform that med sent to pharmacy. She state that pharmacy had called her to let her know that med was there & it will cost her well over $200.

## 2013-04-14 NOTE — Telephone Encounter (Signed)
Called pharmacy @ [facility], no P.A. Needed, medication will cost pt $233 ins is paying $200. She has $1500 left towards her deductible. Pt's ins will change in April & says the coverage will be better.  Can pt have samples to get her thru til ins changes.

## 2013-04-14 NOTE — Telephone Encounter (Signed)
Did she use coupon card/activation code?   Can give samples, but if private insurance, this medication with coupon shouldn't cost that much.

## 2013-04-14 NOTE — Telephone Encounter (Signed)
rx sent

## 2013-04-16 NOTE — Telephone Encounter (Signed)
Called pt & she said she didn't have a coupon card and will be switching to [facility] 05/12/13, so samples [facility] given to hold til ins change, per Audelia Acton

## 2013-06-11 DIAGNOSIS — E119 Type 2 diabetes mellitus without complications: Secondary | ICD-10-CM

## 2013-06-11 DIAGNOSIS — Z0100 Encounter for examination of eyes and vision without abnormal findings: Secondary | ICD-10-CM

## 2013-06-11 HISTORY — DX: Type 2 diabetes mellitus without complications: E11.9

## 2013-06-11 HISTORY — DX: Type 2 diabetes mellitus without complications: Z01.00

## 2013-06-16 ENCOUNTER — Telehealth: Payer: Self-pay | Admitting: Internal Medicine

## 2013-06-16 ENCOUNTER — Other Ambulatory Visit: Payer: Self-pay | Admitting: Family Medicine

## 2013-06-16 MED ORDER — GLUCOSE BLOOD VI STRP: ORAL_STRIP | Status: DC

## 2013-06-16 NOTE — Telephone Encounter (Signed)
I SENT THE RX FOR ACCU-CHEK METER AND SUPPLIES TO HER PHARMACY. CLS

## 2013-06-16 NOTE — Telephone Encounter (Signed)
Pt states that she has medicaid now and she needs a new meter,test strips and lancets. Her insurance will cover accu-chek or one touch. Pt test twice a day. Send to [facility]

## 2013-06-21 ENCOUNTER — Telehealth: Payer: Self-pay | Admitting: Internal Medicine

## 2013-06-21 ENCOUNTER — Telehealth: Payer: Self-pay | Admitting: Family Medicine

## 2013-06-21 ENCOUNTER — Other Ambulatory Visit: Payer: Self-pay | Admitting: Family Medicine

## 2013-06-21 MED ORDER — ACCU-CHEK AVIVA PLUS W/DEVICE KIT: PACK | Status: DC

## 2013-06-21 MED ORDER — ACCU-CHEK SOFT TOUCH LANCETS MISC: Status: DC

## 2013-06-21 NOTE — Telephone Encounter (Signed)
Pt called this morning as accu-check and lancets not at pharmacy.  Called in same.

## 2013-06-21 NOTE — Telephone Encounter (Signed)
I have sent in meter and lancets for an accu-chek meter with lancets.

## 2013-06-24 ENCOUNTER — Telehealth: Payer: Self-pay | Admitting: Medical

## 2013-06-24 NOTE — Telephone Encounter (Signed)
Pt brought by sample pack of  Iaao Tea  (herbal Tea) And Delgada  (ganoderma infused slimming coffee_    She wants to know if she can take these

## 2013-06-25 NOTE — Telephone Encounter (Signed)
Pt.notified

## 2013-06-25 NOTE — Telephone Encounter (Signed)
Ok to try/use

## 2013-07-14 ENCOUNTER — Other Ambulatory Visit: Payer: Self-pay | Admitting: Family Medicine

## 2013-08-31 ENCOUNTER — Ambulatory Visit
Admission: RE | Admit: 2013-08-31 | Discharge: 2013-08-31 | Disposition: A | Discharge status: Home / Self Care | Payer: Medicare Other | Source: Ambulatory Visit | Attending: Medical | Admitting: Medical

## 2013-08-31 DIAGNOSIS — N63 Unspecified lump in unspecified breast: Secondary | ICD-10-CM

## 2013-09-28 ENCOUNTER — Ambulatory Visit (INDEPENDENT_AMBULATORY_CARE_PROVIDER_SITE_OTHER): Payer: Medicare Other | Admitting: Medical

## 2013-09-28 ENCOUNTER — Telehealth: Payer: Self-pay | Admitting: Medical

## 2013-09-28 ENCOUNTER — Encounter: Payer: Self-pay | Admitting: Medical

## 2013-09-28 VITALS — BP 134/80 | HR 88 | Temp 98.0°F | Resp 15 | Ht 63.0 in | Wt 224.0 lb

## 2013-09-28 DIAGNOSIS — E1165 Type 2 diabetes mellitus with hyperglycemia: Secondary | ICD-10-CM

## 2013-09-28 DIAGNOSIS — E785 Hyperlipidemia, unspecified: Secondary | ICD-10-CM

## 2013-09-28 DIAGNOSIS — Z23 Encounter for immunization: Secondary | ICD-10-CM

## 2013-09-28 DIAGNOSIS — Z0000 Encounter for general adult medical examination without abnormal findings: Secondary | ICD-10-CM

## 2013-09-28 DIAGNOSIS — IMO0001 Reserved for inherently not codable concepts without codable children: Secondary | ICD-10-CM

## 2013-09-28 DIAGNOSIS — E669 Obesity, unspecified: Secondary | ICD-10-CM

## 2013-09-28 DIAGNOSIS — Z1211 Encounter for screening for malignant neoplasm of colon: Secondary | ICD-10-CM

## 2013-09-28 DIAGNOSIS — I10 Essential (primary) hypertension: Secondary | ICD-10-CM

## 2013-09-28 LAB — POCT URINALYSIS DIPSTICK
Bilirubin, UA: NEGATIVE
Glucose, UA: NEGATIVE
Ketones, UA: NEGATIVE
Nitrite, UA: NEGATIVE
PROTEIN UA: NEGATIVE
RBC UA: NEGATIVE
SPEC GRAV UA: 1.02
Urobilinogen, UA: NEGATIVE
pH, UA: 5

## 2013-09-28 LAB — CBC
HCT: 34.1 % — ABNORMAL LOW (ref 36.0–46.0)
HEMOGLOBIN: 11.3 g/dL — AB (ref 12.0–15.0)
MCH: 27.3 pg (ref 26.0–34.0)
MCHC: 33.1 g/dL (ref 30.0–36.0)
MCV: 82.4 fL (ref 78.0–100.0)
Platelets: 265 10*3/uL (ref 150–400)
RBC: 4.14 MIL/uL (ref 3.87–5.11)
RDW: 16.1 % — ABNORMAL HIGH (ref 11.5–15.5)
WBC: 8.1 10*3/uL (ref 4.0–10.5)

## 2013-09-28 LAB — HEMOGLOBIN A1C
Hgb A1c MFr Bld: 7.8 % — ABNORMAL HIGH (ref <5.7)
Mean Plasma Glucose: 177 mg/dL — ABNORMAL HIGH (ref <117)

## 2013-09-28 NOTE — Patient Instructions (Signed)
  Thank you for giving me the opportunity to serve you today.    Your diagnosis today includes: Encounter Diagnoses  Name Primary?  . Routine general medical examination at a health care facility Yes  . Type II or unspecified type diabetes mellitus without mention of complication, uncontrolled   . Hyperlipidemia   . Essential hypertension, benign   . Obesity, unspecified   . Special screening for malignant neoplasms, colon   . Need for prophylactic vaccination and inoculation against influenza      Specific recommendations today include:  Pending labs I will likely increase the plain metformin dose  Try and exercise most days of the week for least 30-45 minutes  Eat a healthy low-fat, diabetic diet  Continue your medications as usual  We updated your flu vaccine today  Please check your insurance coverage for shingles vaccine and hepatitis B vaccine  See your eye doctor yearly for diabetic eye exam  Check your feet daily for wounds  See a dentist soon for plaque and dental carie  We will refer you back for colonoscopy screening  Go for mammogram yearly   Return pending labs.

## 2013-09-28 NOTE — Telephone Encounter (Signed)
Called Dr. Lorie Apley office to schedule colonoscopy. They said they would need to talk with patient before they would schedule exam. I left message for patient to call their office.

## 2013-09-28 NOTE — Addendum Note (Signed)
Addended by: Louie Bun on: 09/28/2013 10:59 AM   Modules accepted: Orders

## 2013-09-28 NOTE — Telephone Encounter (Signed)
Refer back to Dr. Collene Mares for repeat screening colonoscopy

## 2013-09-28 NOTE — Progress Notes (Signed)
Subjective:   HPI  Darlene Wade is a 58 y.o. female who presents for a complete physical.   Preventative care:2014 Last ophthalmology visit:2015 Last dental visit:5  Years ago Last colonoscopy:11 years ago Last mammogram:08/2012 Last gynecological exam:3-4 years ago Last EKG:no Last labs:here  Prior vaccinations: TD or Tdap:here Influenza:no Pneumococcal:here Shingles/Zostavax:no  Advanced directive:no Health care power of attorney:no Living will:no  Concerns: None, feeling fine  Diabetes type II -taking metformin 500 mg once daily. Could not afford Kombiglyze XR. Checks feet daily, trying to eat better, she saw ophthalmology few months ago  HTN - compliant with medication without complaint  Obesity - exercising some, trying to eat healthy  Hyperlipidemia - compliant with Lipitor daily, not taking aspirin every day   Reviewed their medical, surgical, family, social, medication, and allergy history and updated chart as appropriate.  Past Medical History  Diagnosis Date  . Hypertension   . Hyperlipidemia   . Elbow fracture, right 2007  . Wears glasses     reading  . Diabetic eye exam 06/2013    [facility]  . Former smoker     20 pack year history, quit 2010  . Diabetes mellitus without complication 1696  . Obesity   . Insomnia     Past Surgical History  Procedure Laterality Date  . Partial hysterectomy  age 89    uterine fibroids, still has ovaries  . Foot mass excision  3/14    left-fusion  . Colonoscopy  2003    Dr. Collene Mares  . Foot arthrotomy  11/12    foot fusion, right  . Tonsillectomy    . Elbow arthroplasty  2006    rt-fx  . Breast lumpectomy with needle localization Left 10/07/2012    Procedure: BREAST LUMPECTOMY WITH NEEDLE LOCALIZATION;  Surgeon: Imogene Burn. Georgette Dover, MD;  Location: [facility];  Service: General;  Laterality: Left;  . Breast surgery    . Breast cyst aspiration  2015    History   Social History  . Marital  Status: Married    Spouse Name: N/A    Number of Children: N/A  . Years of Education: N/A   Occupational History  . Not on file.   Social History Main Topics  . Smoking status: Former Smoker -- 1.00 packs/day for 20 years    Quit date: 04/11/2009  . Smokeless tobacco: Never Used  . Alcohol Use: 0.6 oz/week    1 Glasses of wine per week  . Drug Use: No  . Sexual Activity: Not on file   Other Topics Concern  . Not on file   Social History Narrative   Separated from husband (2013). No significant other.  Walking for exercise 2-3 x per week.  Retired, Set designer in the past.  Watches her 2 twin 24yo granddaughter.  As of 09/2013.    Family History  Problem Relation Age of Onset  . Diabetes Mother   . Hypertension Mother   . Diabetes Maternal Aunt   . Heart disease Maternal Grandfather   . Cancer Neg Hx     Current outpatient prescriptions:amLODipine (NORVASC) 5 MG tablet, Take 1 tablet (5 mg total) by mouth daily., Disp: 30 tablet, Rfl: 5;  atorvastatin (LIPITOR) 40 MG tablet, Take 1 tablet (40 mg total) by mouth daily., Disp: 30 tablet, Rfl: 5;  Blood Glucose Monitoring Suppl (ACCU-CHEK AVIVA PLUS) W/DEVICE KIT, Test blood sugar 2 times daily, Disp: 1 kit, Rfl: 0;  glucose blood test strip, As directed, Disp: 50  each, Rfl: prn Lancets (ACCU-CHEK SOFT TOUCH) lancets, Test sugar twice daily, Disp: 100 each, Rfl: 11;  losartan (COZAAR) 50 MG tablet, Take 1 tablet (50 mg total) by mouth daily., Disp: 30 tablet, Rfl: 5;  metFORMIN (GLUCOPHAGE) 500 MG tablet, Take 1 tablet (500 mg total) by mouth daily with breakfast., Disp: 30 tablet, Rfl: 5;  Saxagliptin-Metformin (KOMBIGLYZE XR) 06-998 MG TB24, Take 1 tablet by mouth daily., Disp: 30 tablet, Rfl: 5 zolpidem (AMBIEN) 10 MG tablet, Take 1 tablet (10 mg total) by mouth at bedtime as needed for sleep., Disp: 15 tablet, Rfl: 1  Allergies  Allergen Reactions  . Naproxen Nausea Only  . Vicodin [Hydrocodone-Acetaminophen] Nausea Only      Review of Systems Constitutional: -fever, -chills, +sweats, -unexpected weight change, -decreased appetite, -fatigue Allergy: -sneezing, -itching, -congestion Dermatology: -changing moles, --rash, -lumps ENT: -runny nose, -ear pain, -sore throat, -hoarseness, -sinus pain, -teeth pain, - ringing in ears, -hearing loss, -nosebleeds Cardiology: -chest pain, -palpitations, -swelling, -difficulty breathing when lying flat, -waking up short of breath Respiratory: -cough, -shortness of breath, -difficulty breathing with exercise or exertion, -wheezing, -coughing up blood Gastroenterology: -abdominal pain, -nausea, -vomiting, -diarrhea, -constipation, -blood in stool, -changes in bowel movement, -difficulty swallowing or eating Hematology: -bleeding, -bruising  Musculoskeletal: +joint aches, -muscle aches, +joint swelling, +back pain, -neck pain, -cramping, -changes in gait Ophthalmology: denies vision changes, eye redness, itching, discharge Urology: -burning with urination, -difficulty urinating, -blood in urine, -urinary frequency, -urgency, -incontinence Neurology: -headache, -weakness, -tingling, -numbness, -memory loss, -falls, -dizziness Psychology: -depressed mood, -agitation, -sleep problems     Objective:   Physical Exam  BP 134/80  Pulse 88  Temp(Src) 98 F (36.7 C) (Oral)  Resp 15  Ht 5' 3" (1.6 m)  Wt 224 lb (101.606 kg)  BMI 39.69 kg/m2  BP Readings from Last 3 Encounters:  09/28/13 134/80  03/12/13 100/78  03/12/13 118/72   Wt Readings from Last 3 Encounters:  09/28/13 224 lb (101.606 kg)  03/12/13 222 lb (100.699 kg)  03/12/13 224 lb 9.6 oz (101.878 kg)    General appearance: alert, no distress, WD/WN, obese AA female Skin: Left lower lateral leg with 1 cm brown flat lesion that she notes is unchanged for years, few other scattered macules, no worrisome lesions HEENT: normocephalic, conjunctiva/corneas normal, sclerae anicteric, PERRLA, EOMi, nares patent, no  discharge or erythema, pharynx normal Oral cavity: MMM, tongue normal, teeth with lots of plaque, left upper molar with decay right upper molar with decay Neck: supple, no lymphadenopathy, no thyromegaly, no masses, normal ROM, no bruits Chest: non tender, normal shape and expansion Heart: RRR, normal S1, S2, no murmurs Lungs: CTA bilaterally, no wheezes, rhonchi, or rales Abdomen: +bs, soft, non tender, non distended, no masses, no hepatomegaly, no splenomegaly, no bruits Back: non tender, normal ROM, no scoliosis Musculoskeletal: Bilateral feet along the great toe dorsally and medially with surgical scars,  Left medial lower leg with linear surgical scar, upper extremities non tender, no obvious deformity, normal ROM throughout, lower extremities non tender, no obvious deformity, normal ROM throughout Extremities: no edema, no cyanosis, no clubbing Pulses: 2+ symmetric, upper and lower extremities, normal cap refill Neurological: alert, oriented x 3, CN2-12 intact, strength normal upper extremities and lower extremities, sensation normal throughout, DTRs 2+ throughout, no cerebellar signs, gait normal Psychiatric: normal affect, behavior normal, pleasant  Breast: nontender, left medial breast at 8:00 with linear horizontal scar, small 1 cm nodular mass from the prior cyst that was aspirated a few months ago, otherwise  no masses or lumps, no skin changes, no nipple discharge or inversion, no axillary lymphadenopathy Gyn: Normal external genitalia without lesions, vagina with normal mucosa, status post hysterectomy, no abnormal vaginal discharge.   adnexa not enlarged, nontender, no masses.  Exam chaperoned by nurse. Rectal: deferred     Assessment and Plan :    Encounter Diagnoses  Name Primary?  . Routine general medical examination at a health care facility Yes  . Type II or unspecified type diabetes mellitus without mention of complication, uncontrolled   . Hyperlipidemia   . Essential  hypertension, benign   . Obesity, unspecified   . Special screening for malignant neoplasms, colon   . Need for prophylactic vaccination and inoculation against influenza      Physical exam - discussed healthy lifestyle, diet, exercise, preventative care, vaccinations, and addressed their concerns.  Handout given.  Counseled on the influenza virus vaccine.  Vaccine information sheet given.  Influenza vaccine given after consent obtained.  Specific recommendations today include:  Pending labs I will likely increase the plain metformin dose  Take a daily 81 mg baby aspirin at bedtime  Try and exercise most days of the week for least 30-45 minutes  Eat a healthy low-fat, diabetic diet  Continue your medications as usual  We updated your flu vaccine today  Please check your insurance coverage for shingles vaccine and hepatitis B vaccine  See your eye doctor yearly for diabetic eye exam  Check your feet daily for wounds  See a dentist soon for plaque and dental carie  We will refer you back for colonoscopy screening  Go for mammogram yearly  Follow-up pending labs

## 2013-09-29 ENCOUNTER — Other Ambulatory Visit: Payer: Self-pay | Admitting: Medical

## 2013-09-29 DIAGNOSIS — I10 Essential (primary) hypertension: Secondary | ICD-10-CM

## 2013-09-29 DIAGNOSIS — E7800 Pure hypercholesterolemia, unspecified: Secondary | ICD-10-CM

## 2013-09-29 LAB — LIPID PANEL
CHOLESTEROL: 127 mg/dL (ref 0–200)
HDL: 48 mg/dL (ref >39)
LDL Cholesterol: 60 mg/dL (ref 0–99)
TRIGLYCERIDES: 97 mg/dL (ref <150)
Total CHOL/HDL Ratio: 2.6 Ratio
VLDL: 19 mg/dL (ref 0–40)

## 2013-09-29 LAB — COMPREHENSIVE METABOLIC PANEL
ALBUMIN: 3.9 g/dL (ref 3.5–5.2)
ALT: 13 U/L (ref 0–35)
AST: 14 U/L (ref 0–37)
Alkaline Phosphatase: 82 U/L (ref 39–117)
BILIRUBIN TOTAL: 0.5 mg/dL (ref 0.2–1.2)
BUN: 14 mg/dL (ref 6–23)
CO2: 24 meq/L (ref 19–32)
Calcium: 9.4 mg/dL (ref 8.4–10.5)
Chloride: 104 mEq/L (ref 96–112)
Creat: 0.76 mg/dL (ref 0.50–1.10)
GLUCOSE: 128 mg/dL — AB (ref 70–99)
POTASSIUM: 3.9 meq/L (ref 3.5–5.3)
Sodium: 138 mEq/L (ref 135–145)
TOTAL PROTEIN: 7.6 g/dL (ref 6.0–8.3)

## 2013-09-29 LAB — VITAMIN D 25 HYDROXY (VIT D DEFICIENCY, FRACTURES): VIT D 25 HYDROXY: 27 ng/mL — AB (ref 30–89)

## 2013-09-29 LAB — MICROALBUMIN / CREATININE URINE RATIO
CREATININE, URINE: 296.7 mg/dL
Microalb Creat Ratio: 3.3 mg/g (ref 0.0–30.0)
Microalb, Ur: 0.98 mg/dL (ref 0.00–1.89)

## 2013-09-29 LAB — TSH: TSH: 0.986 u[IU]/mL (ref 0.350–4.500)

## 2013-09-29 MED ORDER — ALOGLIPTIN-METFORMIN HCL 12.5-1000 MG PO TABS: 1.0000 | ORAL_TABLET | Freq: Two times a day (BID) | ORAL | Status: DC

## 2013-09-29 MED ORDER — LOSARTAN POTASSIUM 50 MG PO TABS: 50.0000 mg | ORAL_TABLET | Freq: Every day | ORAL | Status: DC

## 2013-09-29 MED ORDER — AMLODIPINE BESYLATE 5 MG PO TABS: 5.0000 mg | ORAL_TABLET | Freq: Every day | ORAL | Status: DC

## 2013-09-29 MED ORDER — ATORVASTATIN CALCIUM 40 MG PO TABS: 40.0000 mg | ORAL_TABLET | Freq: Every day | ORAL | Status: DC

## 2013-09-30 ENCOUNTER — Encounter: Payer: 59 | Admitting: Medical

## 2013-10-01 ENCOUNTER — Telehealth: Payer: Self-pay | Admitting: Internal Medicine

## 2013-10-01 NOTE — Telephone Encounter (Signed)
Pt called stating that kazano is over $265.00. And she can not afford that. She would like something else sent in to [facility]

## 2013-10-04 NOTE — Telephone Encounter (Signed)
pls make sure she activated the coupon card .  If not, need to do this.  Epic showed this medication with green check as preferred, so assuming she calls and activates the card, should be $4.  If needed, call drug rep.

## 2013-10-05 NOTE — Telephone Encounter (Signed)
Left message for pt

## 2013-10-06 NOTE — Telephone Encounter (Signed)
Pt called back & states didn't have a coupon card, she will come by office and pick up

## 2013-10-07 ENCOUNTER — Telehealth: Payer: Self-pay | Admitting: Internal Medicine

## 2013-10-07 NOTE — Telephone Encounter (Signed)
Pt called stating that pt can not use the coupon card because she has medicare A & B. What do you want to do now?

## 2013-10-07 NOTE — Telephone Encounter (Signed)
Does this mean she already tried to get [employer] through pharmacy and copay too high?

## 2013-10-08 ENCOUNTER — Other Ambulatory Visit: Payer: Self-pay | Admitting: Medical

## 2013-10-08 MED ORDER — METFORMIN HCL 850 MG PO TABS: 850.0000 mg | ORAL_TABLET | Freq: Two times a day (BID) | ORAL | Status: DC

## 2013-10-08 MED ORDER — GLIMEPIRIDE 2 MG PO TABS: 2.0000 mg | ORAL_TABLET | Freq: Every day | ORAL | Status: DC

## 2013-10-08 NOTE — Telephone Encounter (Signed)
Since insurance rejecting those, I put her on Metformin 850mg  BID and add Glimepiride 2mg  twice daily.   Have her check blood sugars, particularly if sweaty, blurred vision, feeling weak in case the glucose is too low.  Glimepiride has some risk of this, but as long as she doesn't skip meals, doubt this will be an issue.  Plan on recheck in 15mo, sooner if needed regarding diabetes.     An in the meantime, ask her to contact insurance about the drug formulary so we will know what other options for diabetes medications we can use.

## 2013-10-08 NOTE — Telephone Encounter (Signed)
From when she called last week she said the med was too high, then she got the coupon card and I am assuming by what she told me she did activate the card and she can't use it cause she has medicare part A and B. And would like something sent in

## 2013-10-11 NOTE — Telephone Encounter (Signed)
Patient is aware of [facility] message and the change in her medications. CLS

## 2013-10-20 ENCOUNTER — Telehealth: Payer: Self-pay | Admitting: Internal Medicine

## 2013-10-20 NOTE — Telephone Encounter (Signed)
Pt states that the glimepiride is making her nausea and she has been taking it about a week @ breakfast time.

## 2013-10-20 NOTE — Telephone Encounter (Signed)
Sometimes the nausea goes away after a week.  I would give it more time.   If still not tolerating this after another week, lets then try just plain Metformin, and focus on strict diabetic diet and exercise.     If she ends up not tolerating Glimepiride, we can try running another medication through insurance again.  Have her call back with update in a week or 2.

## 2013-10-21 NOTE — Telephone Encounter (Signed)
Spoke with patient, she will call back if symptoms persist.

## 2013-10-22 ENCOUNTER — Other Ambulatory Visit: Payer: Self-pay | Admitting: Medical

## 2013-10-26 ENCOUNTER — Encounter: Payer: Self-pay | Admitting: Medical

## 2013-10-28 ENCOUNTER — Encounter: Payer: Self-pay | Admitting: Internal Medicine

## 2014-01-07 ENCOUNTER — Other Ambulatory Visit: Payer: Self-pay | Admitting: Medical

## 2014-02-24 ENCOUNTER — Telehealth: Payer: Self-pay | Admitting: Internal Medicine

## 2014-02-24 NOTE — Telephone Encounter (Signed)
Patient will call back in AM to schedule fasting med check.

## 2014-02-24 NOTE — Telephone Encounter (Signed)
OK to RF

## 2014-02-24 NOTE — Telephone Encounter (Signed)
Needs fasting recheck/med check

## 2014-02-24 NOTE — Telephone Encounter (Signed)
Refill request for losartan,metformin,atorvastatin and amlodipine all #90 to [facility]

## 2014-03-04 ENCOUNTER — Encounter: Payer: Self-pay | Admitting: Medical

## 2014-03-08 ENCOUNTER — Ambulatory Visit (INDEPENDENT_AMBULATORY_CARE_PROVIDER_SITE_OTHER): Payer: Commercial Managed Care - HMO | Admitting: Medical

## 2014-03-08 ENCOUNTER — Encounter: Payer: Self-pay | Admitting: Medical

## 2014-03-08 VITALS — BP 112/80 | HR 93 | Temp 98.0°F | Resp 15 | Wt 226.0 lb

## 2014-03-08 DIAGNOSIS — I10 Essential (primary) hypertension: Secondary | ICD-10-CM | POA: Diagnosis not present

## 2014-03-08 DIAGNOSIS — E785 Hyperlipidemia, unspecified: Secondary | ICD-10-CM | POA: Diagnosis not present

## 2014-03-08 DIAGNOSIS — R05 Cough: Secondary | ICD-10-CM | POA: Diagnosis not present

## 2014-03-08 DIAGNOSIS — E669 Obesity, unspecified: Secondary | ICD-10-CM | POA: Diagnosis not present

## 2014-03-08 DIAGNOSIS — J0110 Acute frontal sinusitis, unspecified: Secondary | ICD-10-CM

## 2014-03-08 DIAGNOSIS — E119 Type 2 diabetes mellitus without complications: Secondary | ICD-10-CM

## 2014-03-08 DIAGNOSIS — R059 Cough, unspecified: Secondary | ICD-10-CM

## 2014-03-08 MED ORDER — AMOXICILLIN 875 MG PO TABS: 875.0000 mg | ORAL_TABLET | Freq: Two times a day (BID) | ORAL | Status: DC

## 2014-03-08 MED ORDER — BENZONATATE 200 MG PO CAPS: 200.0000 mg | ORAL_CAPSULE | Freq: Three times a day (TID) | ORAL | Status: DC | PRN

## 2014-03-08 MED ORDER — SAXAGLIPTIN-METFORMIN ER 5-1000 MG PO TB24: 1.0000 | ORAL_TABLET | Freq: Every day | ORAL | Status: DC

## 2014-03-08 NOTE — Progress Notes (Signed)
  Subjective:   Darlene Wade is an 59 y.o. female who presents for follow up of Type 2 diabetes mellitus.   Patient is checking home blood sugars.   Home blood sugar records: LOWEST 59 HIGHEST 151 Current symptoms include: none. Patient denies NO CONCERNS.  Patient is checking their feet daily. Foot concerns (callous, ulcer, wound, thickened nails, toenail fungus, skin fungus, hammer toe): NO PROBLEMS Last dilated eye exam 4 OR 5 OF 2015  Current treatments: metformin 850mg  BID but still getting loose stool regularly Medication compliance: good  Current diet: SOME DAYS ARE BETTER THAN OTHERS Current exercise: walking Known diabetic complications: none  Has 11 day hx/o sinus pressure, cough, congestion, colored phlegm, post nasal drainage, and not improving on OTC medications  Wants to use trainer for exercise program.  Has questions about this.   The following portions of the patient's history were reviewed and updated as appropriate: allergies, current medications, past family history, past medical history, past social history, past surgical history and problem list.  ROS as in subjective above    Objective:   Wt Readings from Last 3 Encounters:  03/08/14 226 lb (102.513 kg)  09/28/13 224 lb (101.606 kg)  03/12/13 222 lb (100.699 kg)   BP 112/80 mmHg  Pulse 93  Temp(Src) 98 F (36.7 C) (Oral)  Resp 15  Wt 226 lb (102.513 kg)  Gen: wd, wn, nad Skin: unremarkable Neck supple, nontender, no lymphadenopathy, no thyromegaly HENT - erythema of pharynx, turbinates swollen and red, sinus mild tenderness, otherwise normal HENT Heart RRR normal s1, s2, no murmurs Lungs clear Ext: no edema Pulses normal   Adult ECG Report  Indication: exercise program, HTN, diabetes  Rate: 99bpm  Rhythm: normal sinus rhythm  QRS Axis: 17 degrees  PR Interval: 125ms  QRS Duration: 78ms  QTc: 447ms  Conduction Disturbances: none  Other Abnormalities: none  Patient's cardiac risk  factors are: diabetes mellitus, dyslipidemia, hypertension and obesity (BMI >= 30 kg/m2).  EKG comparison: 09/2012   Narrative Interpretation: no acute changes      Assessment:   Encounter Diagnoses  Name Primary?  . Diabetes type 2, controlled Yes  . Essential hypertension   . Hyperlipidemia   . Obesity   . Acute frontal sinusitis, recurrence not specified   . Cough       Plan:   Diabetes Mellitus type 2: Education: Reviewed 'ABCs' of diabetes management (respective goals in parentheses):  A1C (<7), blood pressure (<130/80), and cholesterol (LDL <100)  Diabetes mellitus Type II, under good control.  Compliance at present is estimated to be good. Efforts to improve compliance (if necessary) will be directed at increased exercise and diabetic diet, routine exercise, good medication compliance.   Blood pressure: normal blood pressure .   An ACE/ARB is currently part of their treatment regimen.  Dyslipidemia under good control. .  A statin is currently part of their treatment regimen.  Discussed general issues about diabetes pathophysiology and management. Addressed ADA diet. Suggested low cholesterol diet. Encouraged aerobic exercise. Discussed foot care. Reminded to get yearly retinal exam.    HTN - c/t same medication, at goal  Hyperlipidemia - c/t same medication, at goal  Begin exercise program as we discussed, advise gradual progression, listen to her body if things don't feel right and recheck if concerns  Sinusitis, cough - begin amoxicillin, tessalon perles, discussed supportive care, cal if not improving in 4-5 days.  Follow up: 3 months

## 2014-03-09 ENCOUNTER — Telehealth: Payer: Self-pay | Admitting: Family Medicine

## 2014-03-09 DIAGNOSIS — I10 Essential (primary) hypertension: Secondary | ICD-10-CM

## 2014-03-09 NOTE — Telephone Encounter (Signed)
See msg

## 2014-03-09 NOTE — Telephone Encounter (Signed)
[facility] is requesting refill on Atorvastatin, Amlodipine and Glimepiride.  They are also requesting directions, strength, quantity and refills.

## 2014-03-09 NOTE — Telephone Encounter (Signed)
[employer] also request refill on Losartin with strength, directions, quanity and refills

## 2014-03-10 ENCOUNTER — Other Ambulatory Visit: Payer: Self-pay

## 2014-03-10 DIAGNOSIS — E7800 Pure hypercholesterolemia, unspecified: Secondary | ICD-10-CM

## 2014-03-10 NOTE — Telephone Encounter (Signed)
LM for patient to call back, if she wants 30 days with 3 refills or 90 x 1.

## 2014-03-10 NOTE — Telephone Encounter (Signed)
Yes pls refill.  See her recent visit and medications

## 2014-03-10 NOTE — Telephone Encounter (Signed)
Darlene Wade, can these be refilled?

## 2014-03-11 MED ORDER — LOSARTAN POTASSIUM 50 MG PO TABS: 50.0000 mg | ORAL_TABLET | Freq: Every day | ORAL | Status: DC

## 2014-03-11 NOTE — Telephone Encounter (Signed)
done

## 2014-03-14 ENCOUNTER — Other Ambulatory Visit: Payer: Self-pay | Admitting: Family Medicine

## 2014-03-14 ENCOUNTER — Telehealth: Payer: Self-pay | Admitting: Medical

## 2014-03-14 DIAGNOSIS — I10 Essential (primary) hypertension: Secondary | ICD-10-CM

## 2014-03-14 DIAGNOSIS — E7800 Pure hypercholesterolemia, unspecified: Secondary | ICD-10-CM

## 2014-03-14 MED ORDER — GLIMEPIRIDE 2 MG PO TABS: 2.0000 mg | ORAL_TABLET | Freq: Every day | ORAL | Status: DC

## 2014-03-14 MED ORDER — AMLODIPINE BESYLATE 5 MG PO TABS: 5.0000 mg | ORAL_TABLET | Freq: Every day | ORAL | Status: DC

## 2014-03-14 MED ORDER — ATORVASTATIN CALCIUM 40 MG PO TABS: 40.0000 mg | ORAL_TABLET | Freq: Every day | ORAL | Status: DC

## 2014-03-14 MED ORDER — ACCU-CHEK SOFT TOUCH LANCETS MISC: Status: DC

## 2014-03-14 NOTE — Telephone Encounter (Signed)
Rx refills sent to her [facility]

## 2014-03-14 NOTE — Telephone Encounter (Signed)
Rcvd new Rx request for Amlodipine 5MG , Atorvastatin 40MG , Glimepiride 2MG , Accu-Chek Aviva Plus Test Strips & Lancets

## 2014-03-23 ENCOUNTER — Telehealth: Payer: Self-pay | Admitting: Internal Medicine

## 2014-03-23 ENCOUNTER — Other Ambulatory Visit: Payer: Self-pay | Admitting: Family Medicine

## 2014-03-23 MED ORDER — ACCU-CHEK SOFT TOUCH LANCETS MISC: Status: DC

## 2014-03-23 NOTE — Telephone Encounter (Signed)
Rx for test strips and Lancets was sent over for refills to [facility]

## 2014-03-23 NOTE — Telephone Encounter (Signed)
Pt needs accu-chek aviva Plus Tests Strips sent to [facility]. Pt test 2 times daily

## 2014-03-24 ENCOUNTER — Other Ambulatory Visit: Payer: Self-pay | Admitting: Medical

## 2014-03-24 ENCOUNTER — Telehealth: Payer: Self-pay | Admitting: Medical

## 2014-03-24 ENCOUNTER — Other Ambulatory Visit: Payer: Self-pay

## 2014-03-24 ENCOUNTER — Ambulatory Visit
Admission: RE | Admit: 2014-03-24 | Discharge: 2014-03-24 | Disposition: A | Discharge status: Home / Self Care | Payer: Commercial Managed Care - HMO | Source: Ambulatory Visit | Attending: Medical | Admitting: Medical

## 2014-03-24 ENCOUNTER — Ambulatory Visit (INDEPENDENT_AMBULATORY_CARE_PROVIDER_SITE_OTHER): Payer: Commercial Managed Care - HMO | Admitting: Medical

## 2014-03-24 VITALS — BP 124/76 | HR 80 | Temp 98.0°F | Resp 15

## 2014-03-24 DIAGNOSIS — W07XXXA Fall from chair, initial encounter: Secondary | ICD-10-CM

## 2014-03-24 DIAGNOSIS — S79912A Unspecified injury of left hip, initial encounter: Secondary | ICD-10-CM | POA: Diagnosis not present

## 2014-03-24 DIAGNOSIS — E119 Type 2 diabetes mellitus without complications: Secondary | ICD-10-CM

## 2014-03-24 DIAGNOSIS — S3993XA Unspecified injury of pelvis, initial encounter: Secondary | ICD-10-CM

## 2014-03-24 MED ORDER — GLUCOSE BLOOD VI STRP: ORAL_STRIP | Status: DC

## 2014-03-24 MED ORDER — SITAGLIPTIN PHOS-METFORMIN HCL 50-1000 MG PO TABS: 1.0000 | ORAL_TABLET | Freq: Two times a day (BID) | ORAL | Status: DC

## 2014-03-24 NOTE — Progress Notes (Signed)
Subjective: Here for fall.  Fell yesterday afternoon 03/23/14.  [place] out of a chair at [employer].   Went to sit down, and as hip hit bottom of chair, somehow rolled out of the chair and hit the floor.   Landed on left hip directly, falling straight down.  Had immediate pain.  Someone helped her up, and she stayed in the chair until ready to leave.  Eventually walked to her car.  The hip has continued to hurt since then.  Feels like she can move her leg ok.   Hurts to sit for prolonged period or roll over on the hip.   Husband said it looked bruised.   Using heating pad.  Took some ibuprofen OTC last night.  No numbness or tingling.  No weakness.   No other aggravating or relieving factors.  She notes after last visit her accucheck glucometer supplies was not called in properly per [facility]  [employer] won't cover Kazano or Kombiglyze  No other complaint.  ROS as in subjective  Objective: BP 124/76 mmHg  Pulse 80  Temp(Src) 98 F (36.7 C) (Oral)  Resp 15  Gen: wdwn, nad Skin: no obvious ecchymosis or erythema MSK: tender over left buttock generalized, no swelling, otherwise left hip and leg nontneder, no deformity, hip ROM seems full with only minimal pain, resisted hip flexion and extension causes some pain, otherwise hip and leg exam unremarkable Back: nontender, normal ROM Gait seems normal Leg neurovascularly intact   Assessment: Encounter Diagnoses  Name Primary?  . Hip injury, left, initial encounter Yes  . Injury of pelvis, initial encounter   . Fall from chair, initial encounter     Plan: Go for xray.  Advised that she likely has a bruise of the left buttock and hip given the mechanism of injury, but xray to rule out fracture.  Advised relative rest the next week, ice 20 minutes at a time, stop the heat pad, can use Ibuprofen OTC 3 tablets TID, avoid prolonged sitting, f/u if not improving in 7 days.    I will look into the diabetes medications and supplies.  Advised she may  need to get insurance to send her formulary info.

## 2014-03-24 NOTE — Telephone Encounter (Signed)
Patient states that wrong instructions sent to [facility] for her Accucheck supplies . Please look into this.

## 2014-03-24 NOTE — Telephone Encounter (Signed)
Her strips were not sent in i have taken care of this

## 2014-03-29 ENCOUNTER — Telehealth: Payer: Self-pay | Admitting: Internal Medicine

## 2014-03-29 DIAGNOSIS — E119 Type 2 diabetes mellitus without complications: Secondary | ICD-10-CM

## 2014-03-29 MED ORDER — GLUCOSE BLOOD VI STRP: ORAL_STRIP | Status: DC

## 2014-03-29 MED ORDER — ACCU-CHEK SOFTCLIX LANCET DEV MISC: Status: DC

## 2014-03-29 NOTE — Telephone Encounter (Signed)
Switched test strips and lancets to accu-chek softclix

## 2014-04-01 ENCOUNTER — Telehealth: Payer: Self-pay | Admitting: Medical

## 2014-04-01 NOTE — Telephone Encounter (Signed)
Pt states that Kombiglyze is too expensive so she wants to go back to Metformin 800MG . Requesting samples of Metformin 800mg . She wants to let us know that we will get a request from her mail order pharmacy for Metformin 800MG .

## 2014-04-01 NOTE — Telephone Encounter (Signed)
Have her find out from insurer what other diabetes medications they will cover or send [place] copy of insurance formulary for diabetes medications

## 2014-04-01 NOTE — Telephone Encounter (Signed)
Patient states that she called her insurance company and the most affordable Dm medication for would be plan Metformin. She states she was on 850 mg. She said can you up that to 1000 mg twice a day to see if that works?

## 2014-04-05 ENCOUNTER — Other Ambulatory Visit: Payer: Self-pay | Admitting: Medical

## 2014-04-05 NOTE — Telephone Encounter (Signed)
With all the back and forth phone calls, I am now thoroughly confused.    If I recall at last visit given HgbA1C over 7%, we continued metformin but in combo as a drug called Kazano.  That didn't work out with insurance, so I changed to [employer].   That was also too expensive.  Thus, I sent Janumet which contains Metformin.  So has she checked with insurance today?  Is Janumet covered?  Is she also still taking Glipizide?  Does she have any plain metformin left?

## 2014-04-06 NOTE — Telephone Encounter (Signed)
Patient states that the Janumet was also to expensive it cost the same as Kombilgze. She said that she was taking Glipizide and she doesn't have anymore Metformin.

## 2014-04-06 NOTE — Telephone Encounter (Signed)
The point is that metformin glipizide combination is not working.  she needs to call and get firm with insurance company to have them send me the formulary so we can try to see what else may be covered.    Any other medication will certainly be a little more expensive, and although I don't expect her to pay 80 or 100 bucks a month, whatever we change to is not on going to be $4 or $10 co-pay.  Given samples of either Kombiglyyze or janumet to get her by for a few weeks until we get the formulary.      Or have her talk with her pharmacist to see if they can help [place] determine a better price option

## 2014-04-07 NOTE — Telephone Encounter (Signed)
Patient notified, samples janumet 50-1000 provided to patient, she will check formulary and let you know. She has already checked with pharmacist and they recommend Metformin.

## 2014-04-26 ENCOUNTER — Other Ambulatory Visit: Payer: Self-pay | Admitting: Medical

## 2014-04-26 ENCOUNTER — Telehealth: Payer: Self-pay | Admitting: Medical

## 2014-04-26 MED ORDER — PIOGLITAZONE HCL 30 MG PO TABS: 30.0000 mg | ORAL_TABLET | Freq: Every day | ORAL | Status: DC

## 2014-04-26 MED ORDER — GLIMEPIRIDE 2 MG PO TABS: 2.0000 mg | ORAL_TABLET | Freq: Every day | ORAL | Status: DC

## 2014-04-26 MED ORDER — METFORMIN HCL 1000 MG PO TABS: 1000.0000 mg | ORAL_TABLET | Freq: Two times a day (BID) | ORAL | Status: DC

## 2014-04-26 NOTE — Telephone Encounter (Signed)
Per last info Janumet copay was going to be $131 /mo  Once she runs out of samples of Janumet, lets go with this regimen:  Glimepiride once daily Metformin 1000mg  BID And add Actos 30mg  once daily  Have her check glucose fasting every morning and anytime she feels like sugar might be low.  If <70 let me know/return   Recheck in 67mo.  If glucose is not controlled on this regimen in 75mo, we will have to make other changes, and can't always guarantee the medication will be cheap.  That is up to her insurer's formulary.

## 2014-04-26 NOTE — Telephone Encounter (Signed)
lmom to cb. cls

## 2014-04-27 NOTE — Telephone Encounter (Signed)
Patient is aware of Dorothea Ogle PA message in full detail and the current regimen she needs to be on for her DM. Patient understood and will follow up in 3  onths

## 2014-08-08 ENCOUNTER — Other Ambulatory Visit: Payer: Self-pay

## 2014-08-22 ENCOUNTER — Other Ambulatory Visit: Payer: Self-pay

## 2014-08-22 DIAGNOSIS — Z9289 Personal history of other medical treatment: Secondary | ICD-10-CM

## 2014-08-22 DIAGNOSIS — Z1231 Encounter for screening mammogram for malignant neoplasm of breast: Secondary | ICD-10-CM

## 2014-08-24 ENCOUNTER — Other Ambulatory Visit: Payer: Self-pay | Admitting: Family Medicine

## 2014-08-24 ENCOUNTER — Telehealth: Payer: Self-pay | Admitting: Internal Medicine

## 2014-08-24 MED ORDER — PIOGLITAZONE HCL 30 MG PO TABS: 30.0000 mg | ORAL_TABLET | Freq: Every day | ORAL | Status: DC

## 2014-08-24 NOTE — Telephone Encounter (Signed)
Rx refill was sent

## 2014-08-24 NOTE — Telephone Encounter (Signed)
Pt is coming in on monday

## 2014-08-24 NOTE — Telephone Encounter (Signed)
I meant to send this to you  

## 2014-08-24 NOTE — Telephone Encounter (Signed)
Please schedule her for diabetes f/u, she is past due

## 2014-08-24 NOTE — Telephone Encounter (Signed)
Refill request for pioglitazone #90 to [place]

## 2014-08-29 ENCOUNTER — Ambulatory Visit: Payer: Commercial Managed Care - HMO | Admitting: Medical

## 2014-09-08 ENCOUNTER — Encounter: Payer: Self-pay | Admitting: Medical

## 2014-09-08 ENCOUNTER — Ambulatory Visit (INDEPENDENT_AMBULATORY_CARE_PROVIDER_SITE_OTHER): Payer: Commercial Managed Care - HMO | Admitting: Medical

## 2014-09-08 VITALS — BP 120/72 | HR 96 | Temp 99.7°F | Resp 18 | Wt 230.0 lb

## 2014-09-08 DIAGNOSIS — H109 Unspecified conjunctivitis: Secondary | ICD-10-CM

## 2014-09-08 DIAGNOSIS — H01006 Unspecified blepharitis left eye, unspecified eyelid: Secondary | ICD-10-CM

## 2014-09-08 DIAGNOSIS — J069 Acute upper respiratory infection, unspecified: Secondary | ICD-10-CM

## 2014-09-08 MED ORDER — ERYTHROMYCIN 5 MG/GM OP OINT: 1.0000 "application " | TOPICAL_OINTMENT | Freq: Four times a day (QID) | OPHTHALMIC | Status: DC

## 2014-09-08 NOTE — Progress Notes (Signed)
Subjective: Chief Complaint  Patient presents with  . swollen eyes and drainage    a few days ago developed cold symptoms and left eye swelling, drainage and pain, right eye is getting infected as well, fever last night, grand daughter had something similar recently   Here for 3 day hx/o illness. Started with possible stye on eyelid, tenderness and swelling, and over the same time period has had headache, some fever up to 101, runny nose, sinus congestion, some ear ache.  No NVD, no SOB, no sore throat, no purulent nasal discharge or productive sputum.  grandchild has had similar symptoms this past week, no other exposures. Glucose has been running in the 90s, not high.  No other aggravating or relieving factors. No other complaint.    Objective: BP 120/72 mmHg  Pulse 96  Temp(Src) 99.7 F (37.6 C) (Tympanic)  Resp 18  Wt 230 lb (104.327 kg)   General appearance: alert, no distress, WD/WN Left upper eyelid somewhat swollen and slightly tender, no obvious stye, bilat conjunctiva with erythema and some purulent discharge, watery, PERRLA, EOMi HENT - no facial swelling erythema or induration otherwise, nares with mild turbinated edema and clear discharge, pharynx normal Oral cavity: MMM, no lesions Neck: supple, no lymphadenopathy, no thyromegaly, no masses Lungs: CTA bilaterally, no wheezes, rhonchi, or rales    Assessment: Encounter Diagnoses  Name Primary?  . Blepharitis of left eye Yes  . Bilateral conjunctivitis   . Acute upper respiratory infection     Plan: Begin Romycin ointment, discussed hand hygiene, prevention, discussed contagious nature of her eye infection, can use Mucinex DM or Robitussin DM for cough/congestion, rest, hydrate well, tylenol for pain or fever.   If worse signs of orbital cellulitis as discussed go to the ED.  No signs of this currently.  F/u if not improved significantly by Monday otherwise

## 2014-09-20 ENCOUNTER — Other Ambulatory Visit: Payer: Self-pay | Admitting: Medical

## 2014-09-21 ENCOUNTER — Ambulatory Visit (INDEPENDENT_AMBULATORY_CARE_PROVIDER_SITE_OTHER): Payer: Commercial Managed Care - HMO | Admitting: Medical

## 2014-09-21 ENCOUNTER — Encounter: Payer: Self-pay | Admitting: Medical

## 2014-09-21 VITALS — BP 118/78 | HR 80 | Wt 231.4 lb

## 2014-09-21 DIAGNOSIS — E119 Type 2 diabetes mellitus without complications: Secondary | ICD-10-CM | POA: Diagnosis not present

## 2014-09-21 DIAGNOSIS — Z7185 Encounter for immunization safety counseling: Secondary | ICD-10-CM | POA: Insufficient documentation

## 2014-09-21 DIAGNOSIS — I10 Essential (primary) hypertension: Secondary | ICD-10-CM | POA: Insufficient documentation

## 2014-09-21 DIAGNOSIS — Z23 Encounter for immunization: Secondary | ICD-10-CM | POA: Insufficient documentation

## 2014-09-21 DIAGNOSIS — E785 Hyperlipidemia, unspecified: Secondary | ICD-10-CM | POA: Insufficient documentation

## 2014-09-21 DIAGNOSIS — Z7189 Other specified counseling: Secondary | ICD-10-CM | POA: Diagnosis not present

## 2014-09-21 LAB — CBC
HCT: 33.5 % — ABNORMAL LOW (ref 36.0–46.0)
Hemoglobin: 11.4 g/dL — ABNORMAL LOW (ref 12.0–15.0)
MCH: 29.1 pg (ref 26.0–34.0)
MCHC: 34 g/dL (ref 30.0–36.0)
MCV: 85.5 fL (ref 78.0–100.0)
MPV: 9.3 fL (ref 8.6–12.4)
PLATELETS: 292 10*3/uL (ref 150–400)
RBC: 3.92 MIL/uL (ref 3.87–5.11)
RDW: 16.5 % — AB (ref 11.5–15.5)
WBC: 6.4 10*3/uL (ref 4.0–10.5)

## 2014-09-21 LAB — COMPREHENSIVE METABOLIC PANEL
ALBUMIN: 3.8 g/dL (ref 3.6–5.1)
ALK PHOS: 73 U/L (ref 33–130)
ALT: 10 U/L (ref 6–29)
AST: 12 U/L (ref 10–35)
BUN: 13 mg/dL (ref 7–25)
CALCIUM: 9.6 mg/dL (ref 8.6–10.4)
CHLORIDE: 105 mmol/L (ref 98–110)
CO2: 27 mmol/L (ref 20–31)
Creat: 0.78 mg/dL (ref 0.50–0.99)
GLUCOSE: 82 mg/dL (ref 65–99)
Potassium: 4.4 mmol/L (ref 3.5–5.3)
Sodium: 142 mmol/L (ref 135–146)
TOTAL PROTEIN: 7.5 g/dL (ref 6.1–8.1)
Total Bilirubin: 0.4 mg/dL (ref 0.2–1.2)

## 2014-09-21 LAB — LIPID PANEL
CHOL/HDL RATIO: 2.2 ratio (ref <=5.0)
Cholesterol: 123 mg/dL — ABNORMAL LOW (ref 125–200)
HDL: 56 mg/dL (ref >=46)
LDL CALC: 57 mg/dL (ref <130)
Triglycerides: 50 mg/dL (ref <150)
VLDL: 10 mg/dL (ref <30)

## 2014-09-21 LAB — POCT GLYCOSYLATED HEMOGLOBIN (HGB A1C): Hemoglobin A1C: 5.6

## 2014-09-21 MED ORDER — LOSARTAN POTASSIUM 50 MG PO TABS: 50.0000 mg | ORAL_TABLET | Freq: Every day | ORAL | Status: DC

## 2014-09-21 MED ORDER — AMLODIPINE BESYLATE 5 MG PO TABS: 5.0000 mg | ORAL_TABLET | Freq: Every day | ORAL | Status: DC

## 2014-09-21 MED ORDER — ATORVASTATIN CALCIUM 40 MG PO TABS: 40.0000 mg | ORAL_TABLET | Freq: Every day | ORAL | Status: DC

## 2014-09-21 MED ORDER — METFORMIN HCL 1000 MG PO TABS: 1000.0000 mg | ORAL_TABLET | Freq: Two times a day (BID) | ORAL | Status: DC

## 2014-09-21 MED ORDER — ACTOS 30 MG PO TABS: 30.0000 mg | ORAL_TABLET | Freq: Every day | ORAL | Status: DC

## 2014-09-21 NOTE — Progress Notes (Signed)
Subjective: Here for routine diabetes f/u.   Exercising some, could do better.  Eating pretty healthy, avoiding sugars.  Compliant with all medications, but is seeing some low sugars, some as low as 43.  No highs above 130.   Is not up to date on eye exam.   Does check feet daily, no sores, but gets some swelling in the distal feet since her prior bunion surgery.  No ankle swelling. otherwise feels fine, no issues.  The blepharitis and eye infection resolved with medication from last visit.  No other new c/o.    Past Medical History  Diagnosis Date  . Hypertension   . Hyperlipidemia   . Elbow fracture, right 2007  . Wears glasses     reading  . Diabetic eye exam 06/2013    [facility]  . Former smoker     20 pack year history, quit 2010  . Diabetes mellitus without complication 5035  . Obesity   . Insomnia    ROS as in subjective   Objective: BP 118/78 mmHg  Pulse 80  Wt 231 lb 6.4 oz (104.962 kg)  General appearance: alert, no distress, WD/WN Oral cavity: MMM, no lesions Neck: supple, no lymphadenopathy, no thyromegaly, no masses Heart: RRR, normal S1, S2, no murmurs Lungs: CTA bilaterally, no wheezes, rhonchi, or rales Abdomen: +bs, soft, non tender, non distended, no masses, no hepatomegaly, no splenomegaly Pulses: 2+ symmetric, upper and lower extremities, normal cap refill Ext: no edema See separate foot exam     Assessment: Encounter Diagnoses  Name Primary?  . Type 2 diabetes mellitus without complication Yes  . Essential hypertension, benign   . Hyperlipidemia   . Need for prophylactic vaccination and inoculation against influenza   . Vaccine counseling     Plan: Discussed goals of diabetes control.  See eye doctor yearly, check feet daily for wounds, needs to exercise more, c/t diabetic diet.  discussed vaccines.  Offered Hep B series but she declines.   She is up to date on pneumococcal and Tdap.    Counseled on the influenza virus vaccine.  Vaccine  information sheet given.  Influenza vaccine given after consent obtained.  STOP glipizide given some low readings.  otherwise c/t all other medications.  Darlene Wade was seen today for diabetes.  Diagnoses and all orders for this visit:  Type 2 diabetes mellitus without complication -     HgB W6F -     Comprehensive metabolic panel -     Lipid panel -     CBC -     HM DIABETES EYE EXAM -     HM DIABETES FOOT EXAM -     Microalbumin / creatinine urine ratio -     losartan (COZAAR) 50 MG tablet; Take 1 tablet (50 mg total) by mouth daily. -     ACTOS 30 MG tablet; Take 1 tablet (30 mg total) by mouth daily. -     metFORMIN (GLUCOPHAGE) 1000 MG tablet; Take 1 tablet (1,000 mg total) by mouth 2 (two) times daily with a meal.  Essential hypertension, benign -     Comprehensive metabolic panel -     Lipid panel -     CBC -     HM DIABETES EYE EXAM -     HM DIABETES FOOT EXAM -     Microalbumin / creatinine urine ratio -     atorvastatin (LIPITOR) 40 MG tablet; Take 1 tablet (40 mg total) by mouth daily. -  losartan (COZAAR) 50 MG tablet; Take 1 tablet (50 mg total) by mouth daily.  Hyperlipidemia -     Comprehensive metabolic panel -     Lipid panel -     CBC -     HM DIABETES EYE EXAM -     HM DIABETES FOOT EXAM -     Microalbumin / creatinine urine ratio -     atorvastatin (LIPITOR) 40 MG tablet; Take 1 tablet (40 mg total) by mouth daily.  Need for prophylactic vaccination and inoculation against influenza  Vaccine counseling  Other orders -     amLODipine (NORVASC) 5 MG tablet; Take 1 tablet (5 mg total) by mouth daily.

## 2014-09-21 NOTE — Patient Instructions (Signed)
Encounter Diagnoses  Name Primary?  . Type 2 diabetes mellitus without complication Yes  . Essential hypertension, benign   . Hyperlipidemia   . Need for prophylactic vaccination and inoculation against influenza   . Vaccine counseling    Recommendations:  STOP glimepiride  Continue ALL other medications as usual  Continue checking sugars.  Goal is to get 70-130 in the mornings, not lower or higher.    See an eye doctor for routine and diabetes eye exam YEARLY  See your dentist yearly for routine dental care including hygiene visits twice yearly.  Check your feet daily for wounds and sores  We updated your flu shot today  We will call with lab results

## 2014-09-22 ENCOUNTER — Ambulatory Visit: Payer: Commercial Managed Care - HMO

## 2014-09-22 ENCOUNTER — Ambulatory Visit
Admission: RE | Admit: 2014-09-22 | Discharge: 2014-09-22 | Disposition: A | Discharge status: Home / Self Care | Payer: Commercial Managed Care - HMO | Source: Ambulatory Visit

## 2014-09-22 DIAGNOSIS — Z1231 Encounter for screening mammogram for malignant neoplasm of breast: Secondary | ICD-10-CM | POA: Diagnosis not present

## 2014-09-22 LAB — MICROALBUMIN / CREATININE URINE RATIO
CREATININE, URINE: 240.1 mg/dL
MICROALB UR: 0.8 mg/dL (ref <2.0)
Microalb Creat Ratio: 3.3 mg/g (ref 0.0–30.0)

## 2014-10-03 NOTE — Addendum Note (Signed)
Addended by: Carolee Rota F on: 10/03/2014 10:48 AM   Modules accepted: Orders

## 2014-12-01 ENCOUNTER — Other Ambulatory Visit: Payer: Self-pay | Admitting: Medical

## 2014-12-01 NOTE — Telephone Encounter (Signed)
This medication is not on her med list, is this ok to refill?

## 2014-12-05 ENCOUNTER — Telehealth: Payer: Self-pay

## 2014-12-05 NOTE — Telephone Encounter (Signed)
[facility] called to ask if pt could get the generic brand of Actos filled. I asked shane he said he could not think of a reason she could not have a generic version. They changed the DAW code and are sending her generic.

## 2014-12-19 ENCOUNTER — Telehealth: Payer: Self-pay | Admitting: Medical

## 2014-12-19 NOTE — Telephone Encounter (Signed)
She said no she did not

## 2014-12-19 NOTE — Telephone Encounter (Signed)
I received request for pain cream from speciality pharmacy. Did she request this?    Sometimes pharmacies send [place] bogus requests.

## 2015-03-03 ENCOUNTER — Telehealth: Payer: Self-pay | Admitting: Medical

## 2015-03-03 NOTE — Telephone Encounter (Signed)
I received order for compounded cream.  Did she request this?

## 2015-03-06 NOTE — Telephone Encounter (Signed)
LMTCB

## 2015-03-08 NOTE — Telephone Encounter (Signed)
Pt stated that is not something she requested

## 2015-03-23 DIAGNOSIS — Z0100 Encounter for examination of eyes and vision without abnormal findings: Secondary | ICD-10-CM | POA: Diagnosis not present

## 2015-03-23 LAB — HM DIABETES EYE EXAM

## 2015-03-28 ENCOUNTER — Encounter: Payer: Self-pay | Admitting: Medical

## 2015-04-27 ENCOUNTER — Other Ambulatory Visit: Payer: Self-pay | Admitting: Medical

## 2015-06-06 ENCOUNTER — Telehealth: Payer: Self-pay | Admitting: Family Medicine

## 2015-06-06 NOTE — Telephone Encounter (Signed)
Spoke to patient about Rx request we received from [employer] [employer], she states she is not familiar with this company and has not requested Rx

## 2015-06-27 ENCOUNTER — Other Ambulatory Visit: Payer: Self-pay | Admitting: Medical

## 2015-06-30 ENCOUNTER — Other Ambulatory Visit: Payer: Self-pay | Admitting: Medical

## 2015-07-04 ENCOUNTER — Other Ambulatory Visit: Payer: Self-pay | Admitting: Medical

## 2015-07-27 ENCOUNTER — Other Ambulatory Visit: Payer: Self-pay | Admitting: Medical

## 2015-07-27 ENCOUNTER — Encounter: Payer: Self-pay | Admitting: Medical

## 2015-07-27 ENCOUNTER — Ambulatory Visit (INDEPENDENT_AMBULATORY_CARE_PROVIDER_SITE_OTHER): Payer: Commercial Managed Care - HMO | Admitting: Medical

## 2015-07-27 VITALS — BP 124/82 | HR 78 | Wt 231.0 lb

## 2015-07-27 DIAGNOSIS — D649 Anemia, unspecified: Secondary | ICD-10-CM

## 2015-07-27 DIAGNOSIS — Z136 Encounter for screening for cardiovascular disorders: Secondary | ICD-10-CM | POA: Insufficient documentation

## 2015-07-27 DIAGNOSIS — E785 Hyperlipidemia, unspecified: Secondary | ICD-10-CM

## 2015-07-27 DIAGNOSIS — I10 Essential (primary) hypertension: Secondary | ICD-10-CM

## 2015-07-27 DIAGNOSIS — D539 Nutritional anemia, unspecified: Secondary | ICD-10-CM | POA: Insufficient documentation

## 2015-07-27 DIAGNOSIS — M6740 Ganglion, unspecified site: Secondary | ICD-10-CM | POA: Diagnosis not present

## 2015-07-27 DIAGNOSIS — Z7189 Other specified counseling: Secondary | ICD-10-CM | POA: Diagnosis not present

## 2015-07-27 DIAGNOSIS — E118 Type 2 diabetes mellitus with unspecified complications: Secondary | ICD-10-CM | POA: Insufficient documentation

## 2015-07-27 DIAGNOSIS — Z1211 Encounter for screening for malignant neoplasm of colon: Secondary | ICD-10-CM

## 2015-07-27 DIAGNOSIS — Z7185 Encounter for immunization safety counseling: Secondary | ICD-10-CM

## 2015-07-27 LAB — CBC WITH DIFFERENTIAL/PLATELET
BASOS PCT: 0 %
Basophils Absolute: 0 cells/uL (ref 0–200)
EOS ABS: 198 {cells}/uL (ref 15–500)
Eosinophils Relative: 3 %
HCT: 34.9 % — ABNORMAL LOW (ref 35.0–45.0)
Hemoglobin: 11.3 g/dL — ABNORMAL LOW (ref 11.7–15.5)
LYMPHS ABS: 2178 {cells}/uL (ref 850–3900)
LYMPHS PCT: 33 %
MCH: 28.3 pg (ref 27.0–33.0)
MCHC: 32.4 g/dL (ref 32.0–36.0)
MCV: 87.3 fL (ref 80.0–100.0)
MONO ABS: 396 {cells}/uL (ref 200–950)
MONOS PCT: 6 %
MPV: 9.7 fL (ref 7.5–12.5)
NEUTROS ABS: 3828 {cells}/uL (ref 1500–7800)
Neutrophils Relative %: 58 %
PLATELETS: 275 10*3/uL (ref 140–400)
RBC: 4 MIL/uL (ref 3.80–5.10)
RDW: 15.8 % — AB (ref 11.0–15.0)
WBC: 6.6 10*3/uL (ref 4.0–10.5)

## 2015-07-27 NOTE — Progress Notes (Signed)
Subjective: Chief Complaint  Patient presents with  . Diabetes    110 for fasting sugars. ankles have been swelling. no compression hose does elevate feet.    Here for routine f/u, med check.  She has hx/o hypertension, diabetes, hyperlipidemia, obesity.     Diabetes - checks sugars twice daily.  Still taking Actos 66m daily and taking Metformin 1006monce daily.  Checking glucose BID, sugars typically in the 110-120 range.  Checks feet daily, but lately having some ankle swelling.    HTN - compliant with Norvasc 9m24mnd Losartan 75m89mily  hyperlipidemia - compliant with Aspirin 81mg59m Lipitor 40mg 48my.  Exercise- water aerobics 2-3 times per week.   [facility] [facility], has cut out sweets and sodas, tries to stay on track.  Has goal to get under 200lb by birthday.   Denies blood in stool.  Does have plan to repeat colonoscopy soon, just got notice from GI about this.   She notes right middle finger with knot on bottom side.  Noticed it months ago.  Doesn't bother her  Past Medical History  Diagnosis Date  . Hypertension   . Hyperlipidemia   . Elbow fracture, right 2007  . Wears glasses     reading  . Diabetic eye exam (HCC) 5[place]15    [facility]  . Former smoker     20 pack year history, quit 2010  . Diabetes mellitus without complication (HCC) 2[place]  [address]esity   . Insomnia    Past Surgical History  Procedure Laterality Date  . Partial hysterectomy  age 60    34erine fibroids, still has ovaries  . Foot mass excision  3/14    left-fusion  . Colonoscopy  2003    Dr. Mann  Collene Maresot arthrotomy  11/12    foot fusion, right  . Tonsillectomy    . Elbow arthroplasty  2006    rt-fx  . Breast lumpectomy with needle localization Left 10/07/2012    Procedure: BREAST LUMPECTOMY WITH NEEDLE LOCALIZATION;  Surgeon: MattheImogene Burni,Georgette Dover [facility];  Laterality: Left;  . Breast surgery    . Breast cyst  aspiration  2015   Current Outpatient Prescriptions on File Prior to Visit  Medication Sig Dispense Refill  . ACTOS 30 MG tablet Take 1 tablet (30 mg total) by mouth daily. 90 tablet 1  . amLODipine (NORVASC) 5 MG tablet Take 1 tablet every day 90 tablet 0  . atorvastatin (LIPITOR) 40 MG tablet TAKE 1 TABLET EVERY DAY 90 tablet 1  . Blood Glucose Monitoring Suppl (ACCU-CHEK AVIVA PLUS) W/DEVICE KIT Test blood sugar 2 times daily 1 kit 0  . glucose blood test strip Patient is to test BID. Needs accu-chek softclik test strips 200 each 4  . Lancet Devices (ACCU-CHEK SOFTCLIX) lancets Test sugar twice daily 200 each 4  . losartan (COZAAR) 50 MG tablet Take 1 tablet every day 90 tablet 0  . metFORMIN (GLUCOPHAGE) 1000 MG tablet Take 1 tablet (1,000 mg total) by mouth 2 (two) times daily with a meal. 180 tablet 1  . Multiple Vitamin (MULTIVITAMIN) tablet Take 1 tablet by mouth daily.    . calcium-vitamin D 250-100 MG-UNIT per tablet Take 1 tablet by mouth 2 (two) times daily. Reported on 07/27/2015    . co-enzyme Q-10 30 MG capsule Take 30 mg by mouth 3 (three) times daily. Reported on 07/27/2015  No current facility-administered medications on file prior to visit.    ROS as in subjective   Objective: BP 124/82 mmHg  Pulse 78  Wt 231 lb (104.781 kg)  Wt Readings from Last 3 Encounters:  07/27/15 231 lb (104.781 kg)  09/21/14 231 lb 6.4 oz (104.962 kg)  09/08/14 230 lb (104.327 kg)    General appearance: alert, no distress, WD/WN, pleasant AA female Neck: supple, no lymphadenopathy, no thyromegaly, no masses Heart: RRR, normal S1, S2, no murmurs Lungs: CTA bilaterally, no wheezes, rhonchi, or rales Pulses: 2+ symmetric, upper and lower extremities, normal cap refill Ext: no edema MSK:  Right volar 3rd finger proximal phalanx with 57m round nodule mobile within tendon sheath suggestive of ganglion cyst   Diabetic Foot Exam - Simple   Simple Foot Form  Diabetic Foot exam was  performed with the following findings:  Yes 07/27/2015 10:08 AM  Visual Inspection  See comments:  Yes  Sensation Testing  Intact to touch and monofilament testing bilaterally:  Yes  Pulse Check  Posterior Tibialis and Dorsalis pulse intact bilaterally:  Yes  Comments  Dorsal bilat feet with surgical scars over bilat feet between 1st and 2nd toes, and medial side of both great toes with surgical scars         Assessment: Encounter Diagnoses  Name Primary?  . Diabetes mellitus with complication ([place] Yes  . Hyperlipidemia   . Essential hypertension, benign   . Morbid obesity, unspecified obesity type ([place]   . Anemia, unspecified   . Special screening for malignant neoplasms, colon   . Vaccine counseling   . Ganglion cyst      Plan: Diabetes type 2 - advised daily foot checks, yearly eye visit with eye doctor.   Labs today.  Consider other medications to help with weight loss.    Obesity - c/t efforts at weight loss Lipids, HTN - c/t same medication, routine exercise, healthy diet, and work on getting to goal with weight loss advised she check insurance coverage for shingles vaccines. Get yearly flu shot She has plans to update colonoscopy soon Knot of finger - ganglion cyst, reassured.  DDavanwas seen today for diabetes.  Diagnoses and all orders for this visit:  Diabetes mellitus with complication ([place] -     Comprehensive metabolic panel -     Hemoglobin A1c -     HM DIABETES EYE EXAM -     HM DIABETES FOOT EXAM  Hyperlipidemia -     Lipid panel  Essential hypertension, benign  Morbid obesity, unspecified obesity type (HCC)  Anemia, unspecified -     CBC with Differential/Platelet -     Iron and TIBC  Special screening for malignant neoplasms, colon  Vaccine counseling  Ganglion cyst

## 2015-07-28 ENCOUNTER — Other Ambulatory Visit: Payer: Self-pay | Admitting: Medical

## 2015-07-28 DIAGNOSIS — E119 Type 2 diabetes mellitus without complications: Secondary | ICD-10-CM

## 2015-07-28 LAB — COMPREHENSIVE METABOLIC PANEL
ALBUMIN: 4 g/dL (ref 3.6–5.1)
ALK PHOS: 81 U/L (ref 33–130)
ALT: 9 U/L (ref 6–29)
AST: 14 U/L (ref 10–35)
BILIRUBIN TOTAL: 0.4 mg/dL (ref 0.2–1.2)
BUN: 13 mg/dL (ref 7–25)
CALCIUM: 9.7 mg/dL (ref 8.6–10.4)
CO2: 24 mmol/L (ref 20–31)
Chloride: 102 mmol/L (ref 98–110)
Creat: 0.76 mg/dL (ref 0.50–0.99)
Glucose, Bld: 96 mg/dL (ref 65–99)
POTASSIUM: 4.6 mmol/L (ref 3.5–5.3)
Sodium: 137 mmol/L (ref 135–146)
Total Protein: 7.5 g/dL (ref 6.1–8.1)

## 2015-07-28 LAB — LIPID PANEL
Cholesterol: 126 mg/dL (ref 125–200)
HDL: 63 mg/dL (ref >=46)
LDL Cholesterol: 51 mg/dL (ref <130)
TRIGLYCERIDES: 62 mg/dL (ref <150)
Total CHOL/HDL Ratio: 2 Ratio (ref <=5.0)
VLDL: 12 mg/dL (ref <30)

## 2015-07-28 LAB — HEMOGLOBIN A1C
Hgb A1c MFr Bld: 6.2 % — ABNORMAL HIGH (ref <5.7)
Mean Plasma Glucose: 131 mg/dL

## 2015-07-28 LAB — IRON AND TIBC
%SAT: 16 % (ref 11–50)
Iron: 44 ug/dL — ABNORMAL LOW (ref 45–160)
TIBC: 280 ug/dL (ref 250–450)
UIBC: 236 ug/dL (ref 125–400)

## 2015-07-28 MED ORDER — FERROUS GLUCONATE 324 (38 FE) MG PO TABS: 324.0000 mg | ORAL_TABLET | Freq: Every day | ORAL | Status: DC

## 2015-07-28 MED ORDER — LOSARTAN POTASSIUM 50 MG PO TABS: ORAL_TABLET | ORAL | Status: DC

## 2015-07-28 MED ORDER — ATORVASTATIN CALCIUM 40 MG PO TABS: 40.0000 mg | ORAL_TABLET | Freq: Every day | ORAL | Status: DC

## 2015-07-28 MED ORDER — METFORMIN HCL 1000 MG PO TABS: 1000.0000 mg | ORAL_TABLET | Freq: Two times a day (BID) | ORAL | Status: DC

## 2015-07-28 MED ORDER — AMLODIPINE BESYLATE 5 MG PO TABS: ORAL_TABLET | ORAL | Status: DC

## 2015-09-15 ENCOUNTER — Other Ambulatory Visit: Payer: Self-pay | Admitting: Medical

## 2015-09-15 DIAGNOSIS — Z1231 Encounter for screening mammogram for malignant neoplasm of breast: Secondary | ICD-10-CM

## 2015-09-25 ENCOUNTER — Ambulatory Visit
Admission: RE | Admit: 2015-09-25 | Discharge: 2015-09-25 | Disposition: A | Discharge status: Home / Self Care | Payer: Commercial Managed Care - HMO | Source: Ambulatory Visit | Attending: Medical | Admitting: Medical

## 2015-09-25 DIAGNOSIS — Z1231 Encounter for screening mammogram for malignant neoplasm of breast: Secondary | ICD-10-CM | POA: Diagnosis not present

## 2015-12-19 ENCOUNTER — Ambulatory Visit (INDEPENDENT_AMBULATORY_CARE_PROVIDER_SITE_OTHER): Payer: Commercial Managed Care - HMO | Admitting: Medical

## 2015-12-19 ENCOUNTER — Encounter: Payer: Self-pay | Admitting: Medical

## 2015-12-19 VITALS — BP 126/82 | HR 61 | Temp 97.6°F | Wt 234.4 lb

## 2015-12-19 DIAGNOSIS — E118 Type 2 diabetes mellitus with unspecified complications: Secondary | ICD-10-CM

## 2015-12-19 DIAGNOSIS — Z1211 Encounter for screening for malignant neoplasm of colon: Secondary | ICD-10-CM

## 2015-12-19 DIAGNOSIS — E785 Hyperlipidemia, unspecified: Secondary | ICD-10-CM

## 2015-12-19 DIAGNOSIS — Z7189 Other specified counseling: Secondary | ICD-10-CM

## 2015-12-19 DIAGNOSIS — I10 Essential (primary) hypertension: Secondary | ICD-10-CM

## 2015-12-19 DIAGNOSIS — D539 Nutritional anemia, unspecified: Secondary | ICD-10-CM

## 2015-12-19 DIAGNOSIS — Z23 Encounter for immunization: Secondary | ICD-10-CM

## 2015-12-19 DIAGNOSIS — Z7185 Encounter for immunization safety counseling: Secondary | ICD-10-CM

## 2015-12-19 LAB — CBC WITH DIFFERENTIAL/PLATELET
BASOS ABS: 0 {cells}/uL (ref 0–200)
Basophils Relative: 0 %
EOS ABS: 159 {cells}/uL (ref 15–500)
EOS PCT: 3 %
HCT: 35.4 % (ref 35.0–45.0)
Hemoglobin: 11.2 g/dL — ABNORMAL LOW (ref 11.7–15.5)
LYMPHS PCT: 40 %
Lymphs Abs: 2120 cells/uL (ref 850–3900)
MCH: 27.8 pg (ref 27.0–33.0)
MCHC: 31.6 g/dL — AB (ref 32.0–36.0)
MCV: 87.8 fL (ref 80.0–100.0)
MONOS PCT: 8 %
MPV: 10.1 fL (ref 7.5–12.5)
Monocytes Absolute: 424 cells/uL (ref 200–950)
NEUTROS ABS: 2597 {cells}/uL (ref 1500–7800)
NEUTROS PCT: 49 %
PLATELETS: 280 10*3/uL (ref 140–400)
RBC: 4.03 MIL/uL (ref 3.80–5.10)
RDW: 16 % — AB (ref 11.0–15.0)
WBC: 5.3 10*3/uL (ref 4.0–10.5)

## 2015-12-19 LAB — COMPREHENSIVE METABOLIC PANEL
ALT: 8 U/L (ref 6–29)
AST: 12 U/L (ref 10–35)
Albumin: 3.8 g/dL (ref 3.6–5.1)
Alkaline Phosphatase: 63 U/L (ref 33–130)
BILIRUBIN TOTAL: 0.4 mg/dL (ref 0.2–1.2)
BUN: 16 mg/dL (ref 7–25)
CALCIUM: 9.6 mg/dL (ref 8.6–10.4)
CO2: 25 mmol/L (ref 20–31)
Chloride: 105 mmol/L (ref 98–110)
Creat: 0.86 mg/dL (ref 0.50–0.99)
GLUCOSE: 91 mg/dL (ref 65–99)
Potassium: 4.3 mmol/L (ref 3.5–5.3)
Sodium: 139 mmol/L (ref 135–146)
Total Protein: 7.5 g/dL (ref 6.1–8.1)

## 2015-12-19 NOTE — Progress Notes (Signed)
Subjective: Chief Complaint  Patient presents with  . DM CHECK    dm check and flu shot , having a bodyaches over the weekend    Here for routine f/u, med check.  She has hx/o hypertension, diabetes, hyperlipidemia, obesity.    Last visit in June we tried making some changes in diabetes medication to help with weight loss as well, but medications were too expensive.  Thus, she continues on Metformin 1032m BID.   Taking Actos 334mdaily.  Checks sugars daily, numbers under 130 in the mornings.  Checks feet daily, but lately having some ankle swelling.     HTN - compliant with Norvasc 96m66mnd Losartan 52m34mily  hyperlipidemia - compliant with Aspirin 81mg53m Lipitor 40mg 32my.  Exercise- water aerobics 2-3 times per week.   [facility] [employer] [facility] [employer] [facility] as well, getting some walking.   Denies blood in stool, no fatigue, taking iron once daily.    Past Medical History:  Diagnosis Date  . Diabetes mellitus without complication (HCC) 2[place]  5053abetic eye exam (HCC) 5[place]15   [facility]  . Elbow fracture, right 2007  . Former smoker    20 pack year history, quit 2010  . Hyperlipidemia   . Hypertension   . Insomnia   . Obesity   . Wears glasses    reading   Past Surgical History:  Procedure Laterality Date  . BREAST CYST ASPIRATION  2015  . BREAST LUMPECTOMY WITH NEEDLE LOCALIZATION Left 10/07/2012   Procedure: BREAST LUMPECTOMY WITH NEEDLE LOCALIZATION;  Surgeon: MattheImogene Burni,Georgette Dover [facility];  Laterality: Left;  . BREAST SURGERY    . COLONOSCOPY  2003   Dr. Mann  Collene MaresBOW ARTHROPLASTY  2006   rt-fx  . FOOT ARTHROTOMY  11/12   foot fusion, right  . FOOT MASS EXCISION  3/14   left-fusion  . PARTIAL HYSTERECTOMY  age 5   u21rine fibroids, still has ovaries  . TONSILLECTOMY      Current Outpatient Prescriptions on File Prior to Visit  Medication Sig Dispense Refill  . amLODipine (NORVASC) 5 MG tablet  Take 1 tablet every day 90 tablet 3  . atorvastatin (LIPITOR) 40 MG tablet Take 1 tablet (40 mg total) by mouth daily. 90 tablet 3  . Blood Glucose Monitoring Suppl (ACCU-CHEK AVIVA PLUS) W/DEVICE KIT Test blood sugar 2 times daily 1 kit 0  . calcium-vitamin D 250-100 MG-UNIT per tablet Take 1 tablet by mouth 2 (two) times daily. Reported on 07/27/2015    . co-enzyme Q-10 30 MG capsule Take 30 mg by mouth 3 (three) times daily. Reported on 07/27/2015    . ferrous gluconate (FERGON) 324 MG tablet Take 1 tablet (324 mg total) by mouth daily with breakfast. 90 tablet 1  . glucose blood test strip Patient is to test BID. Needs accu-chek softclik test strips 200 each 4  . Lancet Devices (ACCU-CHEK SOFTCLIX) lancets Test sugar twice daily 200 each 4  . losartan (COZAAR) 50 MG tablet Take 1 tablet every day 90 tablet 3  . metFORMIN (GLUCOPHAGE) 1000 MG tablet Take 1 tablet (1,000 mg total) by mouth 2 (two) times daily with a meal. 180 tablet 3  . Multiple Vitamin (MULTIVITAMIN) tablet Take 1 tablet by mouth daily.     No current facility-administered medications on file prior to visit.     ROS as in subjective   Objective: BP 126/82  Pulse 61   Temp 97.6 F (36.4 C)   Wt 234 lb 6.4 oz (106.3 kg)   SpO2 96%   BMI 41.52 kg/m   Wt Readings from Last 3 Encounters:  12/19/15 234 lb 6.4 oz (106.3 kg)  07/27/15 231 lb (104.8 kg)  09/21/14 231 lb 6.4 oz (105 kg)    General appearance: alert, no distress, WD/WN, pleasant AA female Neck: supple, no lymphadenopathy, no thyromegaly, no masses Heart: RRR, normal S1, S2, no murmurs Lungs: CTA bilaterally, no wheezes, rhonchi, or rales Pulses: 2+ symmetric, upper and lower extremities, normal cap refill Ext: no edema   Diabetic Foot Exam - Simple   Simple Foot Form Diabetic Foot exam was performed with the following findings:  Yes 12/19/2015 10:59 AM  Visual Inspection See comments:  Yes Sensation Testing Intact to touch and monofilament  testing bilaterally:  Yes Pulse Check Posterior Tibialis and Dorsalis pulse intact bilaterally:  Yes Comments Dorsal surgical scars bilat feet, but otherwise no lesions       Assessment: Encounter Diagnoses  Name Primary?  . Diabetes mellitus with complication ([place]) Yes  . Essential hypertension, benign   . Hyperlipidemia, unspecified hyperlipidemia type   . Morbid obesity ([place])   . Vaccine counseling   . Nutritional anemia, unspecified   . Need for prophylactic vaccination and inoculation against influenza   . Special screening for malignant neoplasms, colon      Plan: Diabetes type 2 - advised daily foot checks, yearly eye visit with eye doctor.   Labs today.       Obesity - c/t efforts at weight loss  Lipids, HTN - c/t same medication, routine exercise, healthy diet, and work on getting to goal with weight loss  Counseled on the influenza virus vaccine.  Vaccine information sheet given.  Influenza vaccine given after consent obtained.  Refer back to Dr. Collene Mares for updated colonoscopy.  Bayli was seen today for dm check.  Diagnoses and all orders for this visit:  Diabetes mellitus with complication ([place]) -     Hemoglobin A1c -     Comprehensive metabolic panel  Essential hypertension, benign -     CBC with Differential/Platelet  Hyperlipidemia, unspecified hyperlipidemia type  Morbid obesity ([place])  Vaccine counseling  Nutritional anemia, unspecified -     Ambulatory referral to Gastroenterology -     CBC with Differential/Platelet  Need for prophylactic vaccination and inoculation against influenza  Special screening for malignant neoplasms, colon -     Ambulatory referral to Gastroenterology -     CBC with Differential/Platelet

## 2015-12-19 NOTE — Addendum Note (Signed)
Addended by: Tyrone Apple on: 12/19/2015 11:53 AM   Modules accepted: Orders

## 2015-12-20 LAB — HEMOGLOBIN A1C
HEMOGLOBIN A1C: 5.8 % — AB (ref <5.7)
MEAN PLASMA GLUCOSE: 120 mg/dL

## 2015-12-21 ENCOUNTER — Encounter: Payer: Self-pay | Admitting: Internal Medicine

## 2015-12-25 ENCOUNTER — Encounter: Payer: Self-pay | Admitting: Gastroenterology

## 2016-01-12 HISTORY — PX: COLONOSCOPY: SHX174

## 2016-01-17 ENCOUNTER — Other Ambulatory Visit: Payer: Self-pay | Admitting: Medical

## 2016-01-23 ENCOUNTER — Encounter: Payer: Self-pay | Admitting: Gastroenterology

## 2016-01-23 ENCOUNTER — Ambulatory Visit (AMBULATORY_SURGERY_CENTER): Payer: Self-pay

## 2016-01-23 VITALS — Ht 63.0 in | Wt 236.8 lb

## 2016-01-23 DIAGNOSIS — Z86010 Personal history of colonic polyps: Secondary | ICD-10-CM

## 2016-01-23 MED ORDER — NA SULFATE-K SULFATE-MG SULF 17.5-3.13-1.6 GM/177ML PO SOLN: ORAL | 0 refills | Status: DC

## 2016-01-23 NOTE — Progress Notes (Signed)
Per pt, no allergies to soy or egg products.Pt not taking any weight loss meds or using  O2 at home. 

## 2016-02-01 ENCOUNTER — Encounter: Payer: Self-pay | Admitting: Gastroenterology

## 2016-02-01 ENCOUNTER — Ambulatory Visit (AMBULATORY_SURGERY_CENTER): Payer: Commercial Managed Care - HMO | Admitting: Gastroenterology

## 2016-02-01 VITALS — BP 90/72 | HR 76 | Temp 98.0°F | Resp 13

## 2016-02-01 DIAGNOSIS — D122 Benign neoplasm of ascending colon: Secondary | ICD-10-CM

## 2016-02-01 DIAGNOSIS — Z1212 Encounter for screening for malignant neoplasm of rectum: Secondary | ICD-10-CM

## 2016-02-01 DIAGNOSIS — Z1211 Encounter for screening for malignant neoplasm of colon: Secondary | ICD-10-CM

## 2016-02-01 LAB — GLUCOSE, CAPILLARY
GLUCOSE-CAPILLARY: 70 mg/dL (ref 65–99)
Glucose-Capillary: 73 mg/dL (ref 65–99)
Glucose-Capillary: 83 mg/dL (ref 65–99)

## 2016-02-01 MED ORDER — SODIUM CHLORIDE 0.9 % IV SOLN: 500.0000 mL | INTRAVENOUS | Status: DC

## 2016-02-01 NOTE — Progress Notes (Signed)
Called to room to assist during endoscopic procedure.  Patient ID and intended procedure confirmed with present staff. Received instructions for my participation in the procedure from the performing physician.  

## 2016-02-01 NOTE — Patient Instructions (Signed)
Impression/Recommendations:  [place] handout given to patient. Diverticulosis handout given to patient.  Repeat colonoscopy recommended for surveillance.   Date to be determined based on pathology results.  YOU HAD AN ENDOSCOPIC PROCEDURE TODAY AT [facility]:   Refer to the procedure report that was given to you for any specific questions about what was found during the examination.  If the procedure report does not answer your questions, please call your gastroenterologist to clarify.  If you requested that your care partner not be given the details of your procedure findings, then the procedure report has been included in a sealed envelope for you to review at your convenience later.  YOU SHOULD EXPECT: Some feelings of bloating in the abdomen. Passage of more gas than usual.  Walking can help get rid of the air that was put into your GI tract during the procedure and reduce the bloating. If you had a lower endoscopy (such as a colonoscopy or flexible sigmoidoscopy) you may notice spotting of blood in your stool or on the toilet paper. If you underwent a bowel prep for your procedure, you may not have a normal bowel movement for a few days.  Please Note:  You might notice some irritation and congestion in your nose or some drainage.  This is from the oxygen used during your procedure.  There is no need for concern and it should clear up in a day or so.  SYMPTOMS TO REPORT IMMEDIATELY:   Following lower endoscopy (colonoscopy or flexible sigmoidoscopy):  Excessive amounts of blood in the stool  Significant tenderness or worsening of abdominal pains  Swelling of the abdomen that is new, acute  Fever of 100F or higher ools  For urgent or emergent issues, a gastroenterologist can be reached at any hour by calling [phone].   DIET:  We do recommend a small meal at first, but then you may proceed to your regular diet.  Drink plenty of fluids but you should avoid alcoholic  beverages for 24 hours.  ACTIVITY:  You should plan to take it easy for the rest of today and you should NOT DRIVE or use heavy machinery until tomorrow (because of the sedation medicines used during the test).    FOLLOW UP: Our staff will call the number listed on your records the next business day following your procedure to check on you and address any questions or concerns that you may have regarding the information given to you following your procedure. If we do not reach you, we will leave a message.  However, if you are feeling well and you are not experiencing any problems, there is no need to return our call.  We will assume that you have returned to your regular daily activities without incident.  If any biopsies were taken you will be contacted by phone or by letter within the next 1-3 weeks.  Please call us at [phone] if you have not heard about the biopsies in 3 weeks.    SIGNATURES/CONFIDENTIALITY: You and/or your care partner have signed paperwork which will be entered into your electronic medical record.  These signatures attest to the fact that that the information above on your After Visit Summary has been reviewed and is understood.  Full responsibility of the confidentiality of this discharge information lies with you and/or your care-partner.

## 2016-02-01 NOTE — Op Note (Signed)
[place] Patient Name: Darlene Wade Procedure Date: 02/01/2016 2:35 PM MRN: [id number] Endoscopist: Mallie Mussel L. Loletha Carrow , MD Age: 60 Referring MD:  Date of Birth: 1955/05/02 Gender: Female Account #: [id number] Procedure:                Colonoscopy Indications:              Screening for colorectal malignant neoplasm (no                            adenomatous polyps on 2007 colonoscopy) Medicines:                Monitored Anesthesia Care Procedure:                Pre-Anesthesia Assessment:                           - Prior to the procedure, a History and Physical                            was performed, and patient medications and                            allergies were reviewed. The patient's tolerance of                            previous anesthesia was also reviewed. The risks                            and benefits of the procedure and the sedation                            options and risks were discussed with the patient.                            All questions were answered, and informed consent                            was obtained. Prior Anticoagulants: The patient has                            taken no previous anticoagulant or antiplatelet                            agents. ASA Grade Assessment: II - A patient with                            mild systemic disease. After reviewing the risks                            and benefits, the patient was deemed in                            satisfactory condition to undergo the procedure.  After obtaining informed consent, the colonoscope                            was passed under direct vision. Throughout the                            procedure, the patient's blood pressure, pulse, and                            oxygen saturations were monitored continuously. The                            Model CF-HQ190L [id number]) scope was introduced                            through the anus and  advanced to the the cecum,                            identified by appendiceal orifice and ileocecal                            valve. The colonoscopy was performed without                            difficulty. The patient tolerated the procedure                            well. The quality of the bowel preparation was                            excellent. The ileocecal valve, appendiceal                            orifice, and rectum were photographed. The quality                            of the bowel preparation was evaluated using the                            BBPS [facility] Bowel Preparation Scale) with scores                            of: Right Colon = 3, Transverse Colon = 3 and Left                            Colon = 3 (entire mucosa seen well with no residual                            staining, small fragments of stool or opaque                            liquid). The total BBPS score equals 9. The bowel  preparation used was SUPREP. Scope In: 2:38:38 PM Scope Out: 2:51:32 PM Scope Withdrawal Time: 0 hours 9 minutes 49 seconds  Total Procedure Duration: 0 hours 12 minutes 54 seconds  Findings:                 The perianal and digital rectal examinations were                            normal.                           A 2 mm polyp was found in the proximal ascending                            colon. The polyp was sessile. The polyp was removed                            with a cold biopsy forceps. Resection and retrieval                            were complete.                           Multiple small-mouthed diverticula were found in                            the left colon.                           The exam was otherwise without abnormality on                            direct and retroflexion views. Complications:            No immediate complications. Estimated Blood Loss:     Estimated blood loss: none. Impression:               - One 2 mm  polyp in the proximal ascending colon,                            removed with a cold biopsy forceps. Resected and                            retrieved.                           - Diverticulosis in the left colon.                           - The examination was otherwise normal on direct                            and retroflexion views. Recommendation:           - Patient has a contact number available for  emergencies. The signs and symptoms of potential                            delayed complications were discussed with the                            patient. Return to normal activities tomorrow.                            Written discharge instructions were provided to the                            patient.                           - Resume previous diet.                           - Continue present medications.                           - Await pathology results.                           - Repeat colonoscopy is recommended for                            surveillance. The colonoscopy date will be                            determined after pathology results from today's                            exam become available for review. Tyjai Matuszak L. Loletha Carrow, MD 02/01/2016 2:55:13 PM This report has been signed electronically.

## 2016-02-01 NOTE — Progress Notes (Signed)
A/ox3 pleased with MAC, report to Jane RN 

## 2016-02-01 NOTE — Progress Notes (Signed)
Rechecked patient's blood glucose, 200 ml ns given prior to recheck, bolld glucose 70. Still asymptomatic. Informed MD.

## 2016-02-01 NOTE — Progress Notes (Signed)
Patient's initial blood glucose was 73. Patient is asymptomatic.No diabetic medications taken this am.patient did drink gingerale this am. Patient's blood glucose at home this am 86. Discussed with Dr. Loletha Carrow and Osvaldo Angst CRNA. No intervention ordered. On further discussion patient states her baseline glucose is 80-90s. Rarely is her blood glucose in the 100s at home.

## 2016-02-02 ENCOUNTER — Telehealth: Payer: Self-pay

## 2016-02-02 NOTE — Telephone Encounter (Signed)
  Follow up Call-  Call back number 02/01/2016  Post procedure Call Back phone  # [phone]  Permission to leave phone message Yes  Some recent data might be hidden     Patient questions:  Do you have a fever, pain , or abdominal swelling? No. Pain Score  0 *  Have you tolerated food without any problems? Yes.    Have you been able to return to your normal activities? Yes.    Do you have any questions about your discharge instructions: Diet   No. Medications  No. Follow up visit  No.  Do you have questions or concerns about your Care? No.  Actions: * If pain score is 4 or above: No action needed, pain <4.

## 2016-02-09 ENCOUNTER — Encounter: Payer: Self-pay | Admitting: Gastroenterology

## 2016-04-10 DIAGNOSIS — M170 Bilateral primary osteoarthritis of knee: Secondary | ICD-10-CM | POA: Diagnosis not present

## 2016-04-23 ENCOUNTER — Ambulatory Visit: Payer: Commercial Managed Care - HMO | Admitting: Medical

## 2016-05-22 ENCOUNTER — Ambulatory Visit (INDEPENDENT_AMBULATORY_CARE_PROVIDER_SITE_OTHER): Payer: Medicare HMO | Admitting: Medical

## 2016-05-22 ENCOUNTER — Encounter: Payer: Self-pay | Admitting: Medical

## 2016-05-22 VITALS — BP 138/76 | HR 84 | Ht 63.5 in | Wt 243.2 lb

## 2016-05-22 DIAGNOSIS — I10 Essential (primary) hypertension: Secondary | ICD-10-CM | POA: Diagnosis not present

## 2016-05-22 DIAGNOSIS — E118 Type 2 diabetes mellitus with unspecified complications: Secondary | ICD-10-CM | POA: Diagnosis not present

## 2016-05-22 DIAGNOSIS — N952 Postmenopausal atrophic vaginitis: Secondary | ICD-10-CM | POA: Insufficient documentation

## 2016-05-22 DIAGNOSIS — Z8781 Personal history of (healed) traumatic fracture: Secondary | ICD-10-CM | POA: Diagnosis not present

## 2016-05-22 DIAGNOSIS — Z7185 Encounter for immunization safety counseling: Secondary | ICD-10-CM

## 2016-05-22 DIAGNOSIS — Z0000 Encounter for general adult medical examination without abnormal findings: Secondary | ICD-10-CM | POA: Diagnosis not present

## 2016-05-22 DIAGNOSIS — E2839 Other primary ovarian failure: Secondary | ICD-10-CM | POA: Insufficient documentation

## 2016-05-22 DIAGNOSIS — E785 Hyperlipidemia, unspecified: Secondary | ICD-10-CM

## 2016-05-22 DIAGNOSIS — E559 Vitamin D deficiency, unspecified: Secondary | ICD-10-CM | POA: Insufficient documentation

## 2016-05-22 DIAGNOSIS — H524 Presbyopia: Secondary | ICD-10-CM | POA: Diagnosis not present

## 2016-05-22 DIAGNOSIS — M19079 Primary osteoarthritis, unspecified ankle and foot: Secondary | ICD-10-CM

## 2016-05-22 DIAGNOSIS — Z7189 Other specified counseling: Secondary | ICD-10-CM

## 2016-05-22 DIAGNOSIS — D539 Nutritional anemia, unspecified: Secondary | ICD-10-CM

## 2016-05-22 LAB — COMPREHENSIVE METABOLIC PANEL
ALBUMIN: 3.8 g/dL (ref 3.6–5.1)
ALK PHOS: 80 U/L (ref 33–130)
ALT: 11 U/L (ref 6–29)
AST: 12 U/L (ref 10–35)
BILIRUBIN TOTAL: 0.4 mg/dL (ref 0.2–1.2)
BUN: 18 mg/dL (ref 7–25)
CALCIUM: 9.4 mg/dL (ref 8.6–10.4)
CO2: 27 mmol/L (ref 20–31)
Chloride: 107 mmol/L (ref 98–110)
Creat: 0.84 mg/dL (ref 0.50–0.99)
Glucose, Bld: 97 mg/dL (ref 65–99)
POTASSIUM: 3.9 mmol/L (ref 3.5–5.3)
Sodium: 142 mmol/L (ref 135–146)
Total Protein: 7.4 g/dL (ref 6.1–8.1)

## 2016-05-22 LAB — CBC
HCT: 36.2 % (ref 35.0–45.0)
HEMOGLOBIN: 11.6 g/dL — AB (ref 11.7–15.5)
MCH: 28.1 pg (ref 27.0–33.0)
MCHC: 32 g/dL (ref 32.0–36.0)
MCV: 87.7 fL (ref 80.0–100.0)
MPV: 9.8 fL (ref 7.5–12.5)
PLATELETS: 286 10*3/uL (ref 140–400)
RBC: 4.13 MIL/uL (ref 3.80–5.10)
RDW: 16.4 % — ABNORMAL HIGH (ref 11.0–15.0)
WBC: 7.8 10*3/uL (ref 4.0–10.5)

## 2016-05-22 LAB — LIPID PANEL
CHOL/HDL RATIO: 2 ratio (ref <5.0)
CHOLESTEROL: 166 mg/dL (ref <200)
HDL: 82 mg/dL (ref >50)
LDL Cholesterol: 74 mg/dL (ref <100)
TRIGLYCERIDES: 52 mg/dL (ref <150)
VLDL: 10 mg/dL (ref <30)

## 2016-05-22 LAB — HM DIABETES EYE EXAM

## 2016-05-22 NOTE — Progress Notes (Addendum)
Subjective:    Darlene Wade is a 61 y.o. female who presents for Preventative Services visit and chronic medical problems/med check visit.    Primary Care Provider Crisoforo Oxford, PA-C here for primary care  Current Health Care Team:  Dentist, eye doctor  Dr. Loletha Carrow, GI  Medical Services you may have received from other than [facility] providers in the past year (date may be approximate) Dr. Loletha Carrow for colonoscopy 01/2016  Exercise Current exercise habits: Home exercise routine includes walkin gsome.   Nutrition/Diet Current diet: in general, a "healthy" diet    Depression Screen Depression screen [facility] 2/9 05/22/2016  Decreased Interest 0  Down, Depressed, Hopeless 0  PHQ - 2 Score 0  Altered sleeping 1  Tired, decreased energy 1  Change in appetite 1  Feeling bad or failure about yourself  0  Trouble concentrating 0  Moving slowly or fidgety/restless 0  Suicidal thoughts 0  PHQ-9 Score 3    Activities of Daily Living Screen/Functional Status Survey Is the patient deaf or have difficulty hearing?: No Does the patient have difficulty seeing, even when wearing glasses/contacts?: No Does the patient have difficulty concentrating, remembering, or making decisions?: Yes (sometimes with memory or forgetting ) Does the patient have difficulty walking or climbing stairs?: Yes (sometime pain in knees  and feet ) Does the patient have difficulty dressing or bathing?: No Does the patient have difficulty doing errands alone such as visiting a doctor's office or shopping?: No  Can patient draw a clock face showing 3:15 o'clock, no  Fall Risk Screen Fall Risk  05/22/2016  Falls in the past year? No    Gait Assessment: Normal gait observed yes  Advanced directives Does patient have a [place]? No Does patient have a Living Will? No  Past Medical History:  Diagnosis Date  . Anemia   . Diabetes mellitus without complication ([place]) 5102  . Diabetic eye  exam [facility])    [employer]  . Elbow fracture, right 2007  . Former smoker    20 pack year history, quit 2010  . Hyperlipidemia   . Hypertension   . Insomnia   . Obesity   . PONV (postoperative nausea and vomiting) 2014   1 time  . Wears glasses    reading    Past Surgical History:  Procedure Laterality Date  . BREAST CYST ASPIRATION  2015  . BREAST LUMPECTOMY WITH NEEDLE LOCALIZATION Left 10/07/2012   Procedure: BREAST LUMPECTOMY WITH NEEDLE LOCALIZATION;  Surgeon: Imogene Burn. Georgette Dover, MD;  Location: [place];  Service: General;  Laterality: Left;  . BREAST SURGERY    . COLONOSCOPY  01/2016   01/2016 with Dr. Loletha Carrow, tubular adenoma polpy; 2003 with Dr. Collene Mares  . ELBOW ARTHROPLASTY  2006   rt-fx  . FOOT ARTHROTOMY  11/12   foot fusion, right  . FOOT MASS EXCISION  3/14   left-fusion  . PARTIAL HYSTERECTOMY  age 19   uterine fibroids, still has ovaries  . TONSILLECTOMY      Social History   Social History  . Marital status: Married    Spouse name: N/A  . Number of children: N/A  . Years of education: N/A   Occupational History  . Not on file.   Social History Main Topics  . Smoking status: Former Smoker    Packs/day: 1.00    Years: 20.00    Quit date: 04/11/2009  . Smokeless tobacco: Never Used  . Alcohol use No  .  Drug use: No  . Sexual activity: Not on file   Other Topics Concern  . Not on file   Social History Narrative   Separated from husband (2013). Living with husband.   No significant other.  Walking for exercise 2-3 x per week.  Retired, Set designer in the past.  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  Driving school bus.   12/2015    Family History  Problem Relation Age of Onset  . Diabetes Mother   . Hypertension Mother   . Diabetes Maternal Aunt   . Heart disease Maternal Grandfather   . Cancer Neg Hx      Current Outpatient Prescriptions:  .  Acetaminophen (TYLENOL 8 HOUR PO), Take 650 mg by mouth as needed., Disp: , Rfl:  .   amLODipine (NORVASC) 5 MG tablet, Take 1 tablet every day, Disp: 90 tablet, Rfl: 3 .  aspirin EC 81 MG tablet, Take 81 mg by mouth daily., Disp: , Rfl:  .  atorvastatin (LIPITOR) 40 MG tablet, Take 1 tablet (40 mg total) by mouth daily. (Patient taking differently: Take 40 mg by mouth daily. Take 1/2 tablet daily), Disp: 90 tablet, Rfl: 3 .  Blood Glucose Monitoring Suppl (ACCU-CHEK AVIVA PLUS) W/DEVICE KIT, Test blood sugar 2 times daily, Disp: 1 kit, Rfl: 0 .  calcium-vitamin D 250-100 MG-UNIT per tablet, Take 1 tablet by mouth 2 (two) times daily. Reported on 07/27/2015, Disp: , Rfl:  .  co-enzyme Q-10 30 MG capsule, Take 30 mg by mouth 3 (three) times daily. Reported on 07/27/2015, Disp: , Rfl:  .  glucose blood test strip, Patient is to test BID. Needs accu-chek softclik test strips, Disp: 200 each, Rfl: 4 .  Lancet Devices (ACCU-CHEK SOFTCLIX) lancets, Test sugar twice daily, Disp: 200 each, Rfl: 4 .  losartan (COZAAR) 50 MG tablet, Take 1 tablet every day, Disp: 90 tablet, Rfl: 3 .  metFORMIN (GLUCOPHAGE) 1000 MG tablet, Take 1 tablet (1,000 mg total) by mouth 2 (two) times daily with a meal., Disp: 180 tablet, Rfl: 3 .  Multiple Vitamin (MULTIVITAMIN) tablet, Take 1 tablet by mouth daily., Disp: , Rfl:  .  NON FORMULARY, Vita Joy Vit D Gummies 2000 IU-Take 2 pills daily, Disp: , Rfl:  .  pioglitazone (ACTOS) 30 MG tablet, TAKE 1 TABLET EVERY DAY, Disp: 90 tablet, Rfl: 0 .  vitamin E 400 UNIT capsule, Take 400 Units by mouth daily., Disp: , Rfl:  .  ferrous gluconate (FERGON) 324 MG tablet, Take 1 tablet (324 mg total) by mouth daily with breakfast. (Patient not taking: Reported on 05/22/2016), Disp: 90 tablet, Rfl: 1  Current Facility-Administered Medications:  .  0.9 %  sodium chloride infusion, 500 mL, Intravenous, Continuous, Nelida Meuse III, MD  Allergies  Allergen Reactions  . Naproxen Nausea Only  . Vicodin [Hydrocodone-Acetaminophen] Nausea Only    History reviewed: allergies,  current medications, past family history, past medical history, past social history, past surgical history and problem list  Chronic issues discussed: Having trouble losing weight.    Diabetes - monitoring glucose, compliant with medication  HTN - compliant with medication  Hyperlipidemia - compliant with medication  Acute issues discussed: none  Objective:      Biometrics BP 138/76   Pulse 84   Ht 5' 3.5" (1.613 m)   Wt 243 lb 3.2 oz (110.3 kg)   SpO2 97%   BMI 42.41 kg/m   Cognitive Testing  Alert? Yes  Normal Appearance?Yes  Oriented to person?  Yes  Place? Yes   Time? Yes  Recall of three objects?  Yes  Can perform simple calculations? Yes  Displays appropriate judgment?Yes  Can read the correct time from a watch face?Yes  General appearance: alert, no distress, WD/WN, AA female  Nutritional Status: Inadequate calore intake? no Loss of muscle mass? no Loss of fat beneath skin? no Localized or general edema? no Diminished functional status? no  Other pertinent exam: HEENT: normocephalic, sclerae anicteric, TMs pearly, nares patent, no discharge or erythema, pharynx normal Oral cavity: MMM, no lesions Neck: supple, no lymphadenopathy, no thyromegaly, no masses Heart: RRR, normal S1, S2, no murmurs Lungs: CTA bilaterally, no wheezes, rhonchi, or rales Abdomen: +bs, soft, non tender, non distended, no masses, no hepatomegaly, no splenomegaly Musculoskeletal: non tender, no swelling, no obvious deformity Extremities: no edema, no cyanosis, no clubbing Pulses: 2+ symmetric, upper and lower extremities, normal cap refill Neurological: alert, oriented x 3, CN2-12 intact, strength normal upper extremities and lower extremities, sensation normal throughout, DTRs 2+ throughout, no cerebellar signs, gait normal Psychiatric: normal affect, behavior normal, pleasant  Breast: nontender, left medial breast at 8:00 with linear horizontal scar, otherwise no masses or lumps,  no skin changes, no nipple discharge or inversion, no axillary lymphadenopathy Gyn: Normal external genitalia without lesions, vagina with normal mucosa, there are atrophic changes, status post hysterectomy, no abnormal vaginal discharge.   adnexa not enlarged, nontender, no masses.  Exam chaperoned by nurse. Rectal: deferred     Assessment:   Encounter Diagnoses  Name Primary?  . Medicare annual wellness visit, initial Yes  . Encounter for health maintenance examination in adult   . Vaccine counseling   . Essential hypertension, benign   . Diabetes mellitus with complication ([place])   . Osteoarthritis of foot, unspecified laterality, unspecified osteoarthritis type   . Hyperlipidemia, unspecified hyperlipidemia type   . Morbid obesity ([place])   . Nutritional anemia, unspecified   . Vitamin D deficiency   . Estrogen deficiency   . History of fracture   . Atrophic vaginitis     Plan:   A preventative services visit was completed today.  During the course of the visit today, we discussed and counseled about appropriate screening and preventive services.  A health risk assessment was established today that included a review of current medications, allergies, social history, family history, medical and preventative health history, biometrics, and preventative screenings to identify potential safety concerns or impairments.  A personalized plan was printed today for your records and use.   Personalized health advice and education was given today to reduce health risks and promote self management and wellness.  Information regarding end of life planning was discussed today.  Conditions/risks identified: Obesity  Chronic problems discussed today: Vaccines - advised she check insurance in regards to coverage for shingles and pneumococcal 13 vaccines  HTN - c/t same medications  Diabetes - c/t routine glucose monitoring, c/t same medications for now, but consider other option to help with  weight loss pending labs.     hyperlipidemia - labs today, c/t same medications  Obesity - she is going to check into [facility] free program for nutrition counseling.  Pending labs, consider other medications such as SLG2 or GLP1 for weight loss and diabetes.  C/t efforts to lose weight.  Discussed diet, exercise.  VIt D deficiency - discussed need for supplemental Vit D.  Lab today  Hx/o fracture as adult, Vit D deficiency, estrogen deficiency - refer for bone density scan  Acute problems discussed today: none  Recommendations:  I recommend a yearly ophthalmology/optometry visit for glaucoma screening and eye checkup  I recommended a yearly dental visit for hygiene and checkup  Advanced directives - discussed nature and purpose of Advanced Directives, encouraged them to complete them if they have not done so and/or encouraged them to get [place] a copy if they have done this already.  I recommend a screening mammogram every 1-2 years  Your last colonoscopy was 01/2016.  I recommend you have a repeat in 5 years  I recommend you have a bone density screen   Referrals today: none   Immunizations: I recommended a yearly influenza vaccine, typically in September when the vaccine is usually available Is the Pneumococcal vaccine up to date: yes. Is the Shingles vaccine up to date: no.   Is the Td/Tdap vaccine up to date: yes.   Medicare Attestation A preventative services visit was completed today.  During the course of the visit the patient was educated and counseled about appropriate screening and preventive services.  A health risk assessment was established with the patient that included a review of current medications, allergies, social history, family history, medical and preventative health history, biometrics, and preventative screenings to identify potential safety concerns or impairments.  A personalized plan was printed today for the patient's records and use.   Personalized  health advice and education was given today to reduce health risks and promote self management and wellness.  Information regarding end of life planning was discussed today.  Crisoforo Oxford, PA-C   05/22/2016

## 2016-05-22 NOTE — Patient Instructions (Addendum)
  Thank you for giving me the opportunity to serve you today.    Your diagnosis today includes: Encounter Diagnoses  Name Primary?  . Medicare annual wellness visit, initial Yes  . Encounter for health maintenance examination in adult   . Vaccine counseling   . Essential hypertension, benign   . Diabetes mellitus with complication ([place])   . Osteoarthritis of foot, unspecified laterality, unspecified osteoarthritis type   . Hyperlipidemia, unspecified hyperlipidemia type   . Morbid obesity ([place])   . Nutritional anemia, unspecified   . Vitamin D deficiency   . Estrogen deficiency   . History of fracture   . Atrophic vaginitis     Recommendations:  Eat a healthy low fat diet  Exercise most days per week, at least 30 minutes or more  See your eye doctor yearly for routine vision care.  See your dentist yearly for routine dental care including hygiene visits twice yearly.  I recommend you have a Shingles Vaccine to help prevent shingles or herpes zoster outbreak.   Please call your insurer to inquire about coverage for the Shingrix vaccine given in 2 doses.   Some insurers cover this vaccine after age 27, some cover this after age 55.  If your insurer covers this, then call to schedule appointment to have this vaccine here.  Likewise, see if insurance coverage the Pneumococcal 13 or Prevnar 13 vaccine as well.  I recommend a yearly Influenza/Flu vaccine, typically in September to help reduce the risk of you and others getting the flu illness  We will call with lab results  See me yearly for a physical  Call and schedule a bone density scan.  I recommend you complete advance directives and a will  Please note: If you take medications for blood pressure, diabetes, cholesterol or other routine medications, we typically need to see you on a separate visit at least yearly for that concern.   For some conditions like diabetes, we will need to see you more often for medication  management

## 2016-05-22 NOTE — Addendum Note (Signed)
Addended by: Carlena Hurl on: 05/22/2016 09:39 PM   Modules accepted: Level of Service

## 2016-05-23 ENCOUNTER — Other Ambulatory Visit: Payer: Self-pay | Admitting: Medical

## 2016-05-23 DIAGNOSIS — E119 Type 2 diabetes mellitus without complications: Secondary | ICD-10-CM

## 2016-05-23 LAB — MICROALBUMIN / CREATININE URINE RATIO
CREATININE, URINE: 225 mg/dL (ref 20–320)
Microalb Creat Ratio: 4 mcg/mg creat (ref <30)
Microalb, Ur: 1 mg/dL

## 2016-05-23 LAB — HEMOGLOBIN A1C
HEMOGLOBIN A1C: 5.9 % — AB (ref <5.7)
MEAN PLASMA GLUCOSE: 123 mg/dL

## 2016-05-23 LAB — VITAMIN D 25 HYDROXY (VIT D DEFICIENCY, FRACTURES): VIT D 25 HYDROXY: 16 ng/mL — AB (ref 30–100)

## 2016-05-23 MED ORDER — FERROUS GLUCONATE 324 (38 FE) MG PO TABS: 324.0000 mg | ORAL_TABLET | Freq: Every day | ORAL | 3 refills | Status: DC

## 2016-05-23 MED ORDER — VITAMIN D (ERGOCALCIFEROL) 1.25 MG (50000 UNIT) PO CAPS: 50000.0000 [IU] | ORAL_CAPSULE | ORAL | 3 refills | Status: DC

## 2016-05-23 MED ORDER — AMLODIPINE BESYLATE 5 MG PO TABS: ORAL_TABLET | ORAL | 3 refills | Status: DC

## 2016-05-23 MED ORDER — ATORVASTATIN CALCIUM 40 MG PO TABS: 40.0000 mg | ORAL_TABLET | Freq: Every day | ORAL | 3 refills | Status: DC

## 2016-05-23 MED ORDER — LOSARTAN POTASSIUM 50 MG PO TABS: ORAL_TABLET | ORAL | 3 refills | Status: DC

## 2016-05-23 MED ORDER — METFORMIN HCL 1000 MG PO TABS: 1000.0000 mg | ORAL_TABLET | Freq: Two times a day (BID) | ORAL | 3 refills | Status: DC

## 2016-05-23 MED ORDER — EMPAGLIFLOZIN 10 MG PO TABS: 10.0000 mg | ORAL_TABLET | Freq: Every day | ORAL | 2 refills | Status: DC

## 2016-05-23 MED ORDER — ESTROGENS, CONJUGATED 0.625 MG/GM VA CREA: TOPICAL_CREAM | VAGINAL | 12 refills | Status: DC

## 2016-05-23 NOTE — Progress Notes (Signed)
Labs good except Vit D low.  recommendations: Vitamin D was low.  I would like them to begin prescription Vitamin D/Ergocalciferol 50,000 units once weekly  Foods that contain vitamin D include seafood such as oysters, shrimp, salmon, herring, cod, egg yolks, mushrooms, milk, and foods fortified with Vitamin D such as orange juice, cereals.  Its also important to get some sun exposure regularly to absorb vitamin D.  STOP Actos as it may be contributing to weight gain.  Continue Metformin but ADD/Begin once daily medication called Jardiance.  This is a diabetes medication that may help with weight loss as well as controlling sugars.  Increase water intake in general sine this causes you to urinate out sugar.   She had thinning skin in the vaginal area/atrophic vaginitis on exam.   I recommend she begin Premarin cream vaginally 1-2 time per week.  We can discuss further next visit.    C/t all other medications as usual.    C/t efforts with healthy diet and exercise.   Have her go to the [facility] program she was talking about for nutrition counseling.   If any medications too expensive let me know right away.

## 2016-05-24 ENCOUNTER — Other Ambulatory Visit: Payer: Self-pay | Admitting: Medical

## 2016-05-24 ENCOUNTER — Telehealth: Payer: Self-pay | Admitting: Medical

## 2016-05-24 MED ORDER — DAPAGLIFLOZIN PROPANEDIOL 5 MG PO TABS: 5.0000 mg | ORAL_TABLET | Freq: Every day | ORAL | 2 refills | Status: DC

## 2016-05-24 MED ORDER — ESTRADIOL 0.1 MG/GM VA CREA: TOPICAL_CREAM | VAGINAL | 2 refills | Status: DC

## 2016-05-24 NOTE — Telephone Encounter (Signed)
Ok, instead I sent Wilder Glade to replace Jardiance for diabetes, weight loss  I sent Estradiol cream to replace premarin cream  Use OTC Vit D 2000 IU daily  Instead of prescription Vit D

## 2016-05-24 NOTE — Telephone Encounter (Signed)
Pt said she was to let Darlene Wade know if any of her meds were expensive. Jardiance, Premarin, Vitamin D are all pricey

## 2016-05-24 NOTE — Telephone Encounter (Signed)
Pt was notified of changes to meds

## 2016-05-27 ENCOUNTER — Telehealth: Payer: Self-pay

## 2016-05-27 ENCOUNTER — Encounter: Payer: Self-pay | Admitting: Medical

## 2016-05-27 NOTE — Telephone Encounter (Signed)
[facility] request clarification on atorvastatin 40mg . They state script reports take 1 tablet daily and take 1/2 tablet daily. Please call to clarify [phone]. Victorino December

## 2016-05-27 NOTE — Telephone Encounter (Signed)
Called an spoke with pharmacist about this she is take a 1/2 tab daily

## 2016-08-04 ENCOUNTER — Other Ambulatory Visit: Payer: Self-pay | Admitting: Medical

## 2016-08-26 ENCOUNTER — Telehealth: Payer: Self-pay | Admitting: Medical

## 2016-08-26 NOTE — Telephone Encounter (Signed)
Called and spoke with pt , rx is up front for pt to p/u

## 2016-08-26 NOTE — Telephone Encounter (Signed)
Pt called and stated that he blood sugar monitor has stopped work. She would like another one if possible. She has strips for Accu-check Aviva plus. Please advise pt if we has those and if she can have one. Pt can be reached [phone].

## 2016-08-28 ENCOUNTER — Telehealth: Payer: Self-pay

## 2016-08-28 NOTE — Telephone Encounter (Signed)
Called pt spoke with her about this already the rx for a new  monitor is up front for her to [p/u and mail out to them

## 2016-08-28 NOTE — Telephone Encounter (Signed)
[facility] pharmacy sent fax asking for true metrix air glucose meter kit with lancets and strips. Darlene Wade

## 2016-08-29 ENCOUNTER — Other Ambulatory Visit: Payer: Self-pay | Admitting: Medical

## 2016-09-02 ENCOUNTER — Other Ambulatory Visit: Payer: Self-pay

## 2016-09-02 MED ORDER — ACCU-CHEK SOFTCLIX LANCET DEV MISC: 4 refills | Status: DC

## 2016-09-02 MED ORDER — ACCU-CHEK AVIVA PLUS W/DEVICE KIT: PACK | 0 refills | Status: DC

## 2016-09-02 NOTE — Telephone Encounter (Signed)
Sent in to [facility]

## 2016-09-02 NOTE — Telephone Encounter (Signed)
Pt called stating that script for blood sugar monitor, lancets, and strips will need to be sent in to [facility] so she can get the supplies free.

## 2016-09-17 ENCOUNTER — Other Ambulatory Visit: Payer: Self-pay | Admitting: Medical

## 2016-09-17 DIAGNOSIS — Z1231 Encounter for screening mammogram for malignant neoplasm of breast: Secondary | ICD-10-CM

## 2016-10-02 ENCOUNTER — Ambulatory Visit
Admission: RE | Admit: 2016-10-02 | Discharge: 2016-10-02 | Disposition: A | Discharge status: Home / Self Care | Payer: Commercial Managed Care - HMO | Source: Ambulatory Visit | Attending: Medical | Admitting: Medical

## 2016-10-02 DIAGNOSIS — Z13820 Encounter for screening for osteoporosis: Secondary | ICD-10-CM | POA: Diagnosis not present

## 2016-10-02 DIAGNOSIS — D539 Nutritional anemia, unspecified: Secondary | ICD-10-CM

## 2016-10-02 DIAGNOSIS — E2839 Other primary ovarian failure: Secondary | ICD-10-CM

## 2016-10-02 DIAGNOSIS — Z1231 Encounter for screening mammogram for malignant neoplasm of breast: Secondary | ICD-10-CM

## 2016-10-02 DIAGNOSIS — E559 Vitamin D deficiency, unspecified: Secondary | ICD-10-CM

## 2016-10-02 DIAGNOSIS — Z780 Asymptomatic menopausal state: Secondary | ICD-10-CM | POA: Diagnosis not present

## 2016-10-02 DIAGNOSIS — Z8781 Personal history of (healed) traumatic fracture: Secondary | ICD-10-CM

## 2016-11-11 ENCOUNTER — Encounter: Payer: Self-pay | Admitting: Family Medicine

## 2016-11-11 ENCOUNTER — Ambulatory Visit (INDEPENDENT_AMBULATORY_CARE_PROVIDER_SITE_OTHER)
Admission: RE | Admit: 2016-11-11 | Discharge: 2016-11-11 | Disposition: A | Discharge status: Home / Self Care | Payer: Medicare HMO | Source: Ambulatory Visit | Attending: Family Medicine | Admitting: Family Medicine

## 2016-11-11 ENCOUNTER — Ambulatory Visit (INDEPENDENT_AMBULATORY_CARE_PROVIDER_SITE_OTHER): Payer: Medicare HMO | Admitting: Family Medicine

## 2016-11-11 VITALS — BP 130/80 | HR 81 | Ht 63.0 in | Wt 244.0 lb

## 2016-11-11 DIAGNOSIS — M25561 Pain in right knee: Secondary | ICD-10-CM

## 2016-11-11 DIAGNOSIS — M170 Bilateral primary osteoarthritis of knee: Secondary | ICD-10-CM

## 2016-11-11 DIAGNOSIS — M25562 Pain in left knee: Secondary | ICD-10-CM

## 2016-11-11 DIAGNOSIS — M1712 Unilateral primary osteoarthritis, left knee: Secondary | ICD-10-CM | POA: Insufficient documentation

## 2016-11-11 MED ORDER — VITAMIN D (ERGOCALCIFEROL) 1.25 MG (50000 UNIT) PO CAPS: 50000.0000 [IU] | ORAL_CAPSULE | ORAL | 0 refills | Status: DC

## 2016-11-11 NOTE — Progress Notes (Addendum)
[name] [address] [place], [place] [address] Phone: [phone] Subjective:    I'm seeing this patient by the request  of:  Tysinger, Camelia Eng, PA-C   CC: Knee painAnd bilateral  IEP:[id number]  Darlene Wade is a 61 y.o. female coming in with complaint of knee pain. Patient has bilateral knee pain. She says the left knee is worse. She has distal swelling in her left leg.   Onset- Last 5/6 months  Location- bilateral medial pain  Duration- Pain during the day Character- Sharp shooting pain Aggravating factors- standing, walking, stairs, standing after sitting Reliving factors- Ice Therapies tried- Ice, sleeping with pillows behind knees, knee brace  Severity- 10 at its worse    patient states that there is mild significant instability. Patient though states that unfortunately sometimes like her legs or running give out on her low positive swelling  Past Medical History:  Diagnosis Date  . Anemia   . Diabetes mellitus without complication ([place]) 6063  . Diabetic eye exam [facility])    [employer]  . Elbow fracture, right 2007  . Former smoker    20 pack year history, quit 2010  . Hyperlipidemia   . Hypertension   . Insomnia   . Obesity   . PONV (postoperative nausea and vomiting) 2014   1 time  . Wears glasses    reading   Past Surgical History:  Procedure Laterality Date  . BREAST CYST ASPIRATION  2015  . BREAST EXCISIONAL BIOPSY Left   . BREAST LUMPECTOMY WITH NEEDLE LOCALIZATION Left 10/07/2012   Procedure: BREAST LUMPECTOMY WITH NEEDLE LOCALIZATION;  Surgeon: Imogene Burn. Georgette Dover, MD;  Location: [place];  Service: General;  Laterality: Left;  . BREAST SURGERY    . COLONOSCOPY  01/2016   01/2016 with Dr. Loletha Carrow, tubular adenoma polpy; 2003 with Dr. Collene Mares  . ELBOW ARTHROPLASTY  2006   rt-fx  . FOOT ARTHROTOMY  11/12   foot fusion, right  . FOOT MASS EXCISION  3/14   left-fusion  . PARTIAL HYSTERECTOMY  age 66   uterine fibroids, still has ovaries  . TONSILLECTOMY     Social History   Social History  . Marital status: Married    Spouse name: N/A  . Number of children: N/A  . Years of education: N/A   Social History Main Topics  . Smoking status: Former Smoker    Packs/day: 1.00    Years: 20.00    Quit date: 04/11/2009  . Smokeless tobacco: Never Used  . Alcohol use No  . Drug use: No  . Sexual activity: Not Asked   Other Topics Concern  . None   Social History Narrative   Separated from husband (2013). Living with husband.   No significant other.  Walking for exercise 2-3 x per week.  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 Naproxen Nausea Only  . Vicodin [Hydrocodone-Acetaminophen] Nausea Only   Family History  Problem Relation Age of Onset  . Diabetes Mother   . Hypertension Mother   . Diabetes Maternal Aunt   . Heart disease Maternal Grandfather   . Cancer Neg Hx      Past medical history, social, surgical and family history all reviewed in electronic medical record.  No pertanent information unless stated regarding to the chief complaint.   Review of Systems:  No headache,  visual changes, nausea, vomiting, diarrhea, constipation, dizziness, abdominal pain, skin rash, fevers, chills, night sweats, weight loss, swollen lymph nodes, body aches, joint swelling, muscle aches, chest pain, shortness of breath, mood changes.   Objective  Blood pressure 130/80, pulse 81, height 5\' 3"  (1.6 m), weight 244 lb (110.7 kg), SpO2 98 %.  General: No apparent distress alert and oriented x3 mood and affect normal, dressed appropriately.  HEENT: Pupils equal, extraocular movements intact  Respiratory: Patient's speak in full sentences and does not appear short of breath  Cardiovascular: No lower extremity edema, non tender, no erythema  Skin: Warm dry intact with no signs of infection or rash  on extremities or on axial skeleton.  Abdomen: Soft nontender  Neuro: Cranial nerves II through XII are intact, neurovascularly intact in all extremities with 2+ DTRs and 2+ pulses.  Lymph: No lymphadenopathy of posterior or anterior cervical chain or axillae bilaterally.  Gait normal with good balance and coordination.  MSK:  Non tender with full range of motion and good stability and symmetric strength and tone of shoulders, elbows, wrist, hip, and ankles bilaterally.  Knee:bilateral valgus deformity noted. Large thigh to calf ratio.  Tender to palpation over medial and PF joint line.  ROM full in flexion and extension and lower leg rotation. instability with valgus force.  painful patellar compression. Patellar glide with moderate crepitus. Patellar and quadriceps tendons unremarkable. Hamstring and quadriceps strength is normal.   MSK US performed of: Bilateral knee This study was ordered, performed, and interpreted by Charlann Boxer D.O.  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 Right knee was prepped with alcohol swab and utilizing anterolateral approach, patient's right knee space was injected with 4:1  marcaine 0.5%: Kenalog 40mg /dL. Patient tolerated the procedure well without immediate complications.  After informed written and verbal consent, patient was seated on exam table. Left knee was prepped with alcohol swab and utilizing anterolateral approach, patient's left knee space was injected with 4:1  marcaine 0.5%: Kenalog 40mg /dL. Patient tolerated the procedure well without immediate complications.   Impression and Recommendations:     This case required medical decision making of moderate complexity.      Note: This dictation was prepared with Dragon dictation along with smaller phrase technology. Any  transcriptional errors that result from this process are unintentional.

## 2016-11-11 NOTE — Patient Instructions (Addendum)
Good to see you  Alvera Singh is your friend. Ice 20 minutes 2 times daily. Usually after activity and before bed. pennsaid pinkie amount topically 2 times daily as needed.   2 injecitons today  Tylenol 500mg  3 times a day  Turmeric 500mg  daily  Tart cherry extract 1200mg  at night  Once weekly vitamin D for 12 weeks.  They will call you on the brace See me again in 4 weeks

## 2016-11-11 NOTE — Assessment & Plan Note (Signed)
Severe medially compartment. We discussed possible custom bracing second her to patient's large thigh to calf ratio and will consider it. Patient does have significant instability. We discussed with patient about icing regimen, home exercises, topical anti-inflammatories given as well as once weekly vitamin D for muscle strength and conditioning. Discussed over-the-counter medications. Discussed proper shoes. Given home exercises. Follow-up again in 4 weeks. Could be a candidate for viscous supplementation

## 2016-11-20 DIAGNOSIS — M170 Bilateral primary osteoarthritis of knee: Secondary | ICD-10-CM | POA: Diagnosis not present

## 2016-12-11 ENCOUNTER — Telehealth: Payer: Self-pay | Admitting: Medical

## 2016-12-11 ENCOUNTER — Other Ambulatory Visit: Payer: Self-pay | Admitting: Family Medicine

## 2016-12-11 MED ORDER — ATORVASTATIN CALCIUM 40 MG PO TABS: 40.0000 mg | ORAL_TABLET | Freq: Every day | ORAL | 2 refills | Status: DC

## 2016-12-11 NOTE — Telephone Encounter (Signed)
Rcvd refill request for Atorvastatin  #[phone] from [facility]

## 2016-12-27 ENCOUNTER — Ambulatory Visit: Payer: Medicare HMO | Admitting: Medical

## 2016-12-27 ENCOUNTER — Encounter: Payer: Self-pay | Admitting: Medical

## 2016-12-27 VITALS — BP 132/80 | HR 91 | Wt 242.4 lb

## 2016-12-27 DIAGNOSIS — E785 Hyperlipidemia, unspecified: Secondary | ICD-10-CM

## 2016-12-27 DIAGNOSIS — E119 Type 2 diabetes mellitus without complications: Secondary | ICD-10-CM | POA: Diagnosis not present

## 2016-12-27 DIAGNOSIS — Z23 Encounter for immunization: Secondary | ICD-10-CM

## 2016-12-27 DIAGNOSIS — I10 Essential (primary) hypertension: Secondary | ICD-10-CM | POA: Diagnosis not present

## 2016-12-27 DIAGNOSIS — E559 Vitamin D deficiency, unspecified: Secondary | ICD-10-CM

## 2016-12-27 LAB — POCT GLYCOSYLATED HEMOGLOBIN (HGB A1C): Hemoglobin A1C: 5.9

## 2016-12-27 MED ORDER — DAPAGLIFLOZIN PROPANEDIOL 5 MG PO TABS: 5.0000 mg | ORAL_TABLET | Freq: Every day | ORAL | 0 refills | Status: DC

## 2016-12-27 MED ORDER — LOSARTAN POTASSIUM 50 MG PO TABS: 50.0000 mg | ORAL_TABLET | Freq: Every day | ORAL | 3 refills | Status: DC

## 2016-12-27 NOTE — Patient Instructions (Signed)
STOP Actos  Begin Farxia 5mg  daily in the morning  Increase water intake  Continue checking sugars yellow.  Call within 3 weeks.  If doing well, then we will continue 5mg  daily.

## 2016-12-27 NOTE — Progress Notes (Signed)
Subjective: Chief Complaint  Patient presents with  . Diabetes    dm check and flu shot    Here for diabetes check.    Has cut out sugars, soda, avoid white bread, fried foods.   Eating more fish.   Trying to do better with diet, exercise.   Walks a mile daily. Has knee issues, seeing orthopedist for left knee.  Taking Turmeric, tart cherry extract.   Past Medical History:  Diagnosis Date  . Anemia   . Diabetes mellitus without complication ([place]) 7322  . Diabetic eye exam [facility])    [employer]  . Elbow fracture, right 2007  . Former smoker    20 pack year history, quit 2010  . Hyperlipidemia   . Hypertension   . Insomnia   . Obesity   . PONV (postoperative nausea and vomiting) 2014   1 time  . Wears glasses    reading   Current Outpatient Medications on File Prior to Visit  Medication Sig Dispense Refill  . Acetaminophen (TYLENOL 8 HOUR PO) Take 650 mg by mouth as needed.    Marland Kitchen amLODipine (NORVASC) 5 MG tablet TAKE 1 TABLET EVERY DAY 90 tablet 3  . aspirin EC 81 MG tablet Take 81 mg by mouth daily.    Marland Kitchen atorvastatin (LIPITOR) 40 MG tablet Take 1 tablet (40 mg total) by mouth daily. 90 tablet 2  . Blood Glucose Monitoring Suppl (ACCU-CHEK AVIVA PLUS) w/Device KIT Test blood sugar 2 times daily 1 kit 0  . calcium-vitamin D 250-100 MG-UNIT per tablet Take 1 tablet by mouth 2 (two) times daily. Reported on 07/27/2015    . co-enzyme Q-10 30 MG capsule Take 30 mg by mouth 3 (three) times daily. Reported on 07/27/2015    . ferrous gluconate (FERGON) 324 MG tablet Take 1 tablet (324 mg total) by mouth daily with breakfast. 90 tablet 3  . glucose blood test strip Patient is to test BID. Needs accu-chek softclik test strips 200 each 4  . Lancet Devices (ACCU-CHEK SOFTCLIX) lancets Test sugar twice daily 200 each 4  . metFORMIN (GLUCOPHAGE) 1000 MG tablet Take 1 tablet (1,000 mg total) by mouth 2 (two) times daily with a meal. 180 tablet 3  . Multiple Vitamin (MULTIVITAMIN) tablet  Take 1 tablet by mouth daily.    . NON FORMULARY Vita Joy Vit D Gummies 2000 IU-Take 2 pills daily    . Vitamin D, Ergocalciferol, (DRISDOL) 50000 units CAPS capsule Take 1 capsule (50,000 Units total) by mouth every 7 (seven) days. 12 capsule 0  . vitamin E 400 UNIT capsule Take 400 Units by mouth daily.     Current Facility-Administered Medications on File Prior to Visit  Medication Dose Route Frequency Provider Last Rate Last Dose  . 0.9 %  sodium chloride infusion  500 mL Intravenous Continuous Danis, Estill Cotta III, MD       ROS as in subjective   Objective: BP 132/80   Pulse 91   Wt 242 lb 6.4 oz (110 kg)   SpO2 99%   BMI 42.94 kg/m   General appearance: alert, no distress, WD/WN,  neck: supple, no lymphadenopathy, no thyromegaly, no masses Heart: RRR, normal S1, S2, no murmurs Lungs: CTA bilaterally, no wheezes, rhonchi, or rales Abdomen: +bs, soft, non tender, non distended, no masses, no hepatomegaly, no splenomegaly Pulses: 2+ symmetric, upper and lower extremities, normal cap refill Ext: no edema  Diabetic Foot Exam - Simple   Simple Foot Form Diabetic Foot exam was  performed with the following findings:  Yes 12/27/2016  1:35 PM  Visual Inspection See comments:  Yes Sensation Testing Intact to touch and monofilament testing bilaterally:  Yes Pulse Check Posterior Tibialis and Dorsalis pulse intact bilaterally:  Yes Comments bilat dorsal feet with linear surgical scar up great toe bilat      Assessment: Encounter Diagnoses  Name Primary?  . Diabetes mellitus without complication ([place]) Yes  . Need for influenza vaccination   . Essential hypertension, benign   . Hyperlipidemia, unspecified hyperlipidemia type   . Vitamin D deficiency     Plan: Diabetes - STOP Actos, c/t metformin, add [place].  Discussed risks/benefits.  Discussed diet, exercise, glucose monitoring.    Call report 3wk on how she is tolerating farxiga and whether or not she has lost any  weight.    Counseled on the influenza virus vaccine.  Vaccine information sheet given.  Influenza vaccine given after consent obtained.  HTN - c/t same medication  Hyperlipidemia - c/t same medication.  Reviewed last labs in chart  Vit D deficiency - c/t same medication  F/u with call back 3wk.  Monik was seen today for diabetes.  Diagnoses and all orders for this visit:  Diabetes mellitus without complication ([place]) -     HgB A1c -     HM DIABETES EYE EXAM -     HM DIABETES FOOT EXAM  Need for influenza vaccination -     Flu Vaccine QUAD 6+ mos PF IM (Fluarix Quad PF)  Essential hypertension, benign  Hyperlipidemia, unspecified hyperlipidemia type  Vitamin D deficiency  Other orders -     losartan (COZAAR) 50 MG tablet; Take 1 tablet (50 mg total) daily by mouth. -     dapagliflozin propanediol (FARXIGA) 5 MG TABS tablet; Take 5 mg daily by mouth.

## 2017-01-27 ENCOUNTER — Encounter: Payer: Self-pay | Admitting: Family Medicine

## 2017-01-27 ENCOUNTER — Ambulatory Visit: Payer: Medicare HMO | Admitting: Family Medicine

## 2017-01-27 VITALS — BP 124/90 | HR 91 | Temp 97.8°F | Ht 63.0 in | Wt 239.6 lb

## 2017-01-27 DIAGNOSIS — M1990 Unspecified osteoarthritis, unspecified site: Secondary | ICD-10-CM

## 2017-01-27 NOTE — Addendum Note (Signed)
Addended by: Denita Lung on: 01/27/2017 09:29 PM   Modules accepted: Orders

## 2017-01-27 NOTE — Patient Instructions (Signed)
Take 2 Aleve twice per day and your Tylenol

## 2017-01-27 NOTE — Progress Notes (Addendum)
   Subjective:    Patient ID: Darlene Wade, female    DOB: Feb 17, 1955, 61 y.o.   MRN: [id number]  HPI She is here for evaluation of knee pain.  She states that both knees hurt her but her left is hurting much worse than the right.  She did have an injection approximately 2 months ago.  She is here to try and get another injection.  Review of the record indicates that she did indeed get an injection however the note also discusses going to using viscous supplementation with her next visit.   Review of Systems     Objective:   Physical Exam Alert and in no distress.  No effusion noted.  Full motion.  No laxity noted.      Assessment & Plan:  Arthritis  Morbid obesity ([place]) - Plan: Amb Referral to Nutrition and Diabetic E  I explained the fact that giving another injection of cortisone would not be appropriate.  Did recommend Tylenol and NSAID of choice and get an appointment with Dr. Tamala Julian to follow-up for viscous supplementation.  Also discussed the possibility down the road of partial knee replacement with her.  I explained the benefits of weight loss to her in regard to her arthritis as well as underlying diabetes and general medical welfare.  We will refer her to nutritionist.  She was interested.

## 2017-02-10 ENCOUNTER — Ambulatory Visit: Payer: Medicare HMO | Admitting: Family Medicine

## 2017-02-12 ENCOUNTER — Ambulatory Visit: Payer: Medicare HMO | Admitting: Family Medicine

## 2017-02-12 ENCOUNTER — Encounter: Payer: Self-pay | Admitting: Family Medicine

## 2017-02-12 DIAGNOSIS — M170 Bilateral primary osteoarthritis of knee: Secondary | ICD-10-CM

## 2017-02-12 NOTE — Patient Instructions (Signed)
Good to see you  Ice is your friend I think you would benefit from viscosupplementation. I would ask you to check with your insurance on coverage. Information they will need includes Diagnosis code- M17.0, M17.2 CPT codes:    Synvisc J7325   monovisc K4469   Orthovisc F0722 See which one is covered then call us at [phone] and we will schedule you  See me again in 2 weeks

## 2017-02-12 NOTE — Progress Notes (Signed)
[name] [address], [place] [address] Phone: [phone] Subjective:     CC: Knee pain follow-up  OMA:[id number]  Darlene Wade is a 62 y.o. female coming in with complaint of knee pain.  Found to have moderate to severe osteoarthritic changes.  Last seen 3 months ago and given injections in the knees bilaterally.  Patient was to continue with conservative therapy including home exercises, icing regimen, and topical anti-inflammatories.  Patient did see another provider in the interim in 2 months and he declined to give a steroid injection which at that time was correct.  Patient states worsening pain in the knees bilaterally.  Increasing instability.  Patient is having difficulty even doing daily activities or walking greater than 200 feet.     Past Medical History:  Diagnosis Date  . Anemia   . Diabetes mellitus without complication ([place]) 4239  . Diabetic eye exam [facility])    [employer]  . Elbow fracture, right 2007  . Former smoker    20 pack year history, quit 2010  . Hyperlipidemia   . Hypertension   . Insomnia   . Obesity   . PONV (postoperative nausea and vomiting) 2014   1 time  . Wears glasses    reading   Past Surgical History:  Procedure Laterality Date  . BREAST CYST ASPIRATION  2015  . BREAST EXCISIONAL BIOPSY Left   . BREAST LUMPECTOMY WITH NEEDLE LOCALIZATION Left 10/07/2012   Procedure: BREAST LUMPECTOMY WITH NEEDLE LOCALIZATION;  Surgeon: Imogene Burn. Georgette Dover, MD;  Location: [place];  Service: General;  Laterality: Left;  . BREAST SURGERY    . COLONOSCOPY  01/2016   01/2016 with Dr. Loletha Carrow, tubular adenoma polpy; 2003 with Dr. Collene Mares  . ELBOW ARTHROPLASTY  2006   rt-fx  . FOOT ARTHROTOMY  11/12   foot fusion, right  . FOOT MASS EXCISION  3/14   left-fusion  . PARTIAL HYSTERECTOMY  age 46   uterine fibroids, still has ovaries  . TONSILLECTOMY     Social History   Socioeconomic History  .  Marital status: Married    Spouse name: None  . Number of children: None  . Years of education: None  . Highest education level: None  Social Needs  . Financial resource strain: None  . Food insecurity - worry: None  . Food insecurity - inability: None  . Transportation needs - medical: None  . Transportation needs - non-medical: None  Occupational History  . None  Tobacco Use  . Smoking status: Former Smoker    Packs/day: 1.00    Years: 20.00    Pack years: 20.00    Last attempt to quit: 04/11/2009    Years since quitting: 7.8  . Smokeless tobacco: Never Used  Substance and Sexual Activity  . Alcohol use: No    Alcohol/week: 0.6 oz    Types: 1 Glasses of wine per week  . Drug use: No  . Sexual activity: None  Other Topics Concern  . None  Social History Narrative   Separated from husband (2013). Living with husband.   No significant other.  Walking for exercise 2-3 x per week.  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 Naproxen Nausea Only  . Vicodin [Hydrocodone-Acetaminophen] Nausea Only   Family History  Problem Relation Age of Onset  . Diabetes Mother   .  Hypertension Mother   . Diabetes Maternal Aunt   . Heart disease Maternal Grandfather   . Cancer Neg Hx      Past medical history, social, surgical and family history all reviewed in electronic medical record.  No pertanent information unless stated regarding to the chief complaint.   Review of Systems:Review of systems updated and as accurate as of 02/12/17  No headache, visual changes, nausea, vomiting, diarrhea, constipation, dizziness, abdominal pain, skin rash, fevers, chills, night sweats, weight loss, swollen lymph nodes, body aches, joint swelling, muscle aches, chest pain, shortness of breath, mood changes.   Objective  Blood pressure (!) 142/88, pulse 96, height 5\' 3"  (1.6 m), weight 242 lb (109.8 kg),  SpO2 98 %. Systems examined below as of 02/12/17   General: No apparent distress alert and oriented x3 mood and affect normal, dressed appropriately.  HEENT: Pupils equal, extraocular movements intact  Respiratory: Patient's speak in full sentences and does not appear short of breath  Cardiovascular: No lower extremity edema, non tender, no erythema  Skin: Warm dry intact with no signs of infection or rash on extremities or on axial skeleton.  Abdomen: Soft nontender  Neuro: Cranial nerves II through XII are intact, neurovascularly intact in all extremities with 2+ DTRs and 2+ pulses.  Lymph: No lymphadenopathy of posterior or anterior cervical chain or axillae bilaterally.  Gait normal with good balance and coordination.  MSK:  Non tender with full range of motion and good stability and symmetric strength and tone of shoulders, elbows, wrist, hip, and ankles bilaterally.  Knee: Bilateral valgus deformity noted. Large thigh to calf ratio.  Tender to palpation over medial and PF joint line.  ROM full in flexion and extension and lower leg rotation. instability with valgus force.  Mild overall painful patellar compression. Patellar glide with moderate crepitus. Patellar and quadriceps tendons unremarkable. Hamstring and quadriceps strength is normal.  After informed written and verbal consent, patient was seated on exam table. Right knee was prepped with alcohol swab and utilizing anterolateral approach, patient's right knee space was injected with 4:1  marcaine 0.5%: Kenalog 40mg /dL. Patient tolerated the procedure well without immediate complications.  After informed written and verbal consent, patient was seated on exam table. Left knee was prepped with alcohol swab and utilizing anterolateral approach, patient's left knee space was injected with 4:1  marcaine 0.5%: Kenalog 40mg /dL. Patient tolerated the procedure well without immediate complications.    Impression and Recommendations:       This case required medical decision making of moderate complexity.      Note: This dictation was prepared with Dragon dictation along with smaller phrase technology. Any transcriptional errors that result from this process are unintentional.

## 2017-02-12 NOTE — Assessment & Plan Note (Signed)
Bilateral injections given today.  Tolerated the procedure well.  We discussed icing regimen and home exercises, we discussed which activities of doing which wants to avoid.  Patient will come back and see me again in 2 weeks.  Worsening symptoms patient could be a candidate for Visco supplementation after failing all conservative therapy.

## 2017-02-14 ENCOUNTER — Telehealth: Payer: Self-pay | Admitting: *Deleted

## 2017-02-14 NOTE — Telephone Encounter (Signed)
Spoke with insurance to verify that Orthovisc injections are covered by [employer].  Ref # [id number]  Pt made aware.

## 2017-02-17 ENCOUNTER — Telehealth: Payer: Self-pay | Admitting: Medical

## 2017-02-17 NOTE — Telephone Encounter (Signed)
Have her call insurance to determine what her insurance formulary preferred is for 2019.    Alternatives to Farxiga include:  Jardiance  Steglatro  Invokana

## 2017-02-17 NOTE — Telephone Encounter (Signed)
Pt called and stated that her insurance will not cover [place]. Please advise pt at [phone].

## 2017-02-18 NOTE — Telephone Encounter (Signed)
Gave the pt  The names of meds  She is going to call he insurance to find out which is cover.

## 2017-02-24 NOTE — Telephone Encounter (Signed)
Pt called back she checked with her insurance company about her medicine for dm, she said that they will pay for jardiance but it has be a 30 day supply.  Wants this sent her pharmacy

## 2017-02-25 ENCOUNTER — Other Ambulatory Visit: Payer: Self-pay | Admitting: Medical

## 2017-02-25 MED ORDER — EMPAGLIFLOZIN 10 MG PO TABS: 10.0000 mg | ORAL_TABLET | Freq: Every day | ORAL | 2 refills | Status: DC

## 2017-02-25 NOTE — Telephone Encounter (Signed)
Jardiance once daily sent to replace [place].  F/u in 6-8 wk for diabetes check

## 2017-02-26 NOTE — Telephone Encounter (Signed)
Called and notified the pt and made her an appt for 6 weeks

## 2017-03-02 NOTE — Progress Notes (Signed)
[name] [address], [place] [address] Phone: [phone] Subjective:    I'm seeing this patient by the request  of:    CC: Severe arthritis of the knees bilaterally  [id number]  Darlene Wade is a 62 y.o. female coming in with complaint of knee pain bilaterally.  Patient did have x-rays that were independently visualized by me after last appointment and was found to have moderate to severe medial joint arthritis.  Patient has failed all conservative therapy.  Patient is wondering what else can be done.      Past Medical History:  Diagnosis Date  . Anemia   . Diabetes mellitus without complication ([place]) 8657  . Diabetic eye exam [facility])    [facility]  . Elbow fracture, right 2007  . Former smoker    20 pack year history, quit 2010  . Hyperlipidemia   . Hypertension   . Insomnia   . Obesity   . PONV (postoperative nausea and vomiting) 2014   1 time  . Wears glasses    reading   Past Surgical History:  Procedure Laterality Date  . BREAST CYST ASPIRATION  2015  . BREAST EXCISIONAL BIOPSY Left   . BREAST LUMPECTOMY WITH NEEDLE LOCALIZATION Left 10/07/2012   Procedure: BREAST LUMPECTOMY WITH NEEDLE LOCALIZATION;  Surgeon: Imogene Burn. Georgette Dover, MD;  Location: [facility];  Service: General;  Laterality: Left;  . BREAST SURGERY    . COLONOSCOPY  01/2016   01/2016 with Dr. Loletha Carrow, tubular adenoma polpy; 2003 with Dr. Collene Mares  . ELBOW ARTHROPLASTY  2006   rt-fx  . FOOT ARTHROTOMY  11/12   foot fusion, right  . FOOT MASS EXCISION  3/14   left-fusion  . PARTIAL HYSTERECTOMY  age 69   uterine fibroids, still has ovaries  . TONSILLECTOMY     Social History   Socioeconomic History  . Marital status: Married    Spouse name: None  . Number of children: None  . Years of education: None  . Highest education level: None  Social Needs  . Financial resource strain: None  . Food insecurity - worry: None  . Food insecurity  - inability: None  . Transportation needs - medical: None  . Transportation needs - non-medical: None  Occupational History  . None  Tobacco Use  . Smoking status: Former Smoker    Packs/day: 1.00    Years: 20.00    Pack years: 20.00    Last attempt to quit: 04/11/2009    Years since quitting: 7.8  . Smokeless tobacco: Never Used  Substance and Sexual Activity  . Alcohol use: No    Alcohol/week: 0.6 oz    Types: 1 Glasses of wine per week  . Drug use: No  . Sexual activity: None  Other Topics Concern  . None  Social History Narrative   Separated from husband (2013). Living with husband.   No significant other.  Walking for exercise 2-3 x per week.  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 Naproxen Nausea Only  . Vicodin [Hydrocodone-Acetaminophen] Nausea Only   Family History  Problem Relation Age of Onset  . Diabetes Mother   . Hypertension Mother   . Diabetes Maternal Aunt   . Heart disease Maternal Grandfather   . Cancer Neg Hx      Past medical history, social, surgical  and family history all reviewed in electronic medical record.  No pertanent information unless stated regarding to the chief complaint.   Review of Systems:Review of systems updated and as accurate as of 03/03/17  No headache, visual changes, nausea, vomiting, diarrhea, constipation, dizziness, abdominal pain, skin rash, fevers, chills, night sweats, weight loss, swollen lymph nodes, body aches, chest pain, shortness of breath, mood changes.  Positive muscle aches and joint swelling  Objective  Blood pressure (!) 142/78, pulse (!) 50, height 5\' 3"  (1.6 m), weight 245 lb (111.1 kg), SpO2 95 %. Systems examined below as of 03/03/17   General: No apparent distress alert and oriented x3 mood and affect normal, dressed appropriately.  HEENT: Pupils equal, extraocular movements intact  Respiratory: Patient's  speak in full sentences and does not appear short of breath  Cardiovascular: No lower extremity edema, non tender, no erythema  Skin: Warm dry intact with no signs of infection or rash on extremities or on axial skeleton.  Abdomen: Soft nontender  Neuro: Cranial nerves II through XII are intact, neurovascularly intact in all extremities with 2+ DTRs and 2+ pulses.  Lymph: No lymphadenopathy of posterior or anterior cervical chain or axillae bilaterally.  Gait antalgic gait MSK:  Non tender with full range of motion and good stability and symmetric strength and tone of shoulders, elbows, wrist, hip, and ankles bilaterally.  Knee: Bilateral valgus deformity noted. Large thigh to calf ratio.  Tender to palpation over medial and PF joint line.  ROM full in flexion and extension and lower leg rotation. instability with valgus force.  painful patellar compression. Patellar glide with moderate crepitus. Patellar and quadriceps tendons unremarkable. Hamstring and quadriceps strength is normal.  After informed written and verbal consent, patient was seated on exam table. Right knee was prepped with alcohol swab and utilizing anterolateral approach, patient's right knee space was injected with15 mg/2.5 mL of Orthovisc(sodium hyaluronate) in a prefilled syringe was injected easily into the knee through a 22-gauge needle..Patient tolerated the procedure well without immediate complications.  After informed written and verbal consent, patient was seated on exam table. Left knee was prepped with alcohol swab and utilizing anterolateral approach, patient's left knee space was injected with15 mg/2.5 mL of Orthovisc(sodium hyaluronate) in a prefilled syringe was injected easily into the knee through a 22-gauge needle..Patient tolerated the procedure well without immediate complications.    Impression and Recommendations:     This case required medical decision making of moderate complexity.      Note:  This dictation was prepared with Dragon dictation along with smaller phrase technology. Any transcriptional errors that result from this process are unintentional.

## 2017-03-03 ENCOUNTER — Ambulatory Visit: Payer: Medicare HMO | Admitting: Family Medicine

## 2017-03-03 ENCOUNTER — Encounter: Payer: Self-pay | Admitting: Family Medicine

## 2017-03-03 DIAGNOSIS — M170 Bilateral primary osteoarthritis of knee: Secondary | ICD-10-CM

## 2017-03-03 NOTE — Assessment & Plan Note (Signed)
Patient is failed all conservative therapy.  Patient did have viscous supplementation in today.  We will follow-up for the next 3 weeks for series of injections.  Patient warned of potential side effects.  We will see her patient in 1 week

## 2017-03-03 NOTE — Patient Instructions (Addendum)
Good to see you  Darlene Wade is your friend.  We started orthovisc today  See you for the next 3 weeks.

## 2017-03-09 NOTE — Progress Notes (Signed)
[name] [address], [place] [address] Phone: [phone] Subjective:     CC: Knee pain bilateral  [id number]  Darlene Wade is a 62 y.o. female coming in with complaint of bilateral knee pain.  Patient did have x-rays done.  Independently visualized by me showing severe medial joint arthritis.  Patient has tried conservative therapy including home exercises, bracing, short stent of formal physical therapy.  Patient states has noticed improvement with the first 1 already.  Not having as much pain.     Past Medical History:  Diagnosis Date  . Anemia   . Diabetes mellitus without complication ([place]) 5366  . Diabetic eye exam [facility])    [facility]  . Elbow fracture, right 2007  . Former smoker    20 pack year history, quit 2010  . Hyperlipidemia   . Hypertension   . Insomnia   . Obesity   . PONV (postoperative nausea and vomiting) 2014   1 time  . Wears glasses    reading   Past Surgical History:  Procedure Laterality Date  . BREAST CYST ASPIRATION  2015  . BREAST EXCISIONAL BIOPSY Left   . BREAST LUMPECTOMY WITH NEEDLE LOCALIZATION Left 10/07/2012   Procedure: BREAST LUMPECTOMY WITH NEEDLE LOCALIZATION;  Surgeon: Imogene Burn. Georgette Dover, MD;  Location: [facility];  Service: General;  Laterality: Left;  . BREAST SURGERY    . COLONOSCOPY  01/2016   01/2016 with Dr. Loletha Carrow, tubular adenoma polpy; 2003 with Dr. Collene Mares  . ELBOW ARTHROPLASTY  2006   rt-fx  . FOOT ARTHROTOMY  11/12   foot fusion, right  . FOOT MASS EXCISION  3/14   left-fusion  . PARTIAL HYSTERECTOMY  age 39   uterine fibroids, still has ovaries  . TONSILLECTOMY     Social History   Socioeconomic History  . Marital status: Married    Spouse name: None  . Number of children: None  . Years of education: None  . Highest education level: None  Social Needs  . Financial resource strain: None  . Food insecurity - worry: None  . Food insecurity -  inability: None  . Transportation needs - medical: None  . Transportation needs - non-medical: None  Occupational History  . None  Tobacco Use  . Smoking status: Former Smoker    Packs/day: 1.00    Years: 20.00    Pack years: 20.00    Last attempt to quit: 04/11/2009    Years since quitting: 7.9  . Smokeless tobacco: Never Used  Substance and Sexual Activity  . Alcohol use: No    Alcohol/week: 0.6 oz    Types: 1 Glasses of wine per week  . Drug use: No  . Sexual activity: None  Other Topics Concern  . None  Social History Narrative   Separated from husband (2013). Living with husband.   No significant other.  Walking for exercise 2-3 x per week.  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 Naproxen Nausea Only  . Vicodin [Hydrocodone-Acetaminophen] Nausea Only   Family History  Problem Relation Age of Onset  . Diabetes Mother   . Hypertension Mother   . Diabetes Maternal Aunt   . Heart disease Maternal Grandfather   . Cancer Neg Hx      Past medical history, social, surgical and family history all reviewed in electronic  medical record.  No pertanent information unless stated regarding to the chief complaint.   Review of Systems:Review of systems updated and as accurate as of 03/10/17  No headache, visual changes, nausea, vomiting, diarrhea, constipation, dizziness, abdominal pain, skin rash, fevers, chills, night sweats, weight loss, swollen lymph nodes, body aches, joint swelling,  chest pain, shortness of breath, mood changes.  Positive muscle aches  Objective  Blood pressure (!) 146/80, pulse 82, height 5\' 3"  (1.6 m), weight 243 lb (110.2 kg), SpO2 96 %. Systems examined below as of 03/10/17   General: No apparent distress alert and oriented x3 mood and affect normal, dressed appropriately.  HEENT: Pupils equal, extraocular movements intact  Respiratory: Patient's speak in  full sentences and does not appear short of breath  Cardiovascular: No lower extremity edema, non tender, no erythema  Skin: Warm dry intact with no signs of infection or rash on extremities or on axial skeleton.  Abdomen: Soft nontender  Neuro: Cranial nerves II through XII are intact, neurovascularly intact in all extremities with 2+ DTRs and 2+ pulses.  Lymph: No lymphadenopathy of posterior or anterior cervical chain or axillae bilaterally.  Gait normal with good balance and coordination.  MSK:  Non tender with full range of motion and good stability and symmetric strength and tone of shoulders, elbows, wrist, hip and ankles bilaterally.  Knee: Bilateral valgus deformity noted. Large thigh to calf ratio.  Tender to palpation over medial and PF joint line.  ROM full in flexion and extension and lower leg rotation. instability with valgus force.  painful patellar compression. Patellar glide with moderate crepitus. Patellar and quadriceps tendons unremarkable. Hamstring and quadriceps strength is normal.  After informed written and verbal consent, patient was seated on exam table. Right knee was prepped with alcohol swab and utilizing anterolateral approach, patient's right knee space was injected with15 mg/2.5 mL of Orthovisc(sodium hyaluronate) in a prefilled syringe was injected easily into the knee through a 22-gauge needle..Patient tolerated the procedure well without immediate complications.  After informed written and verbal consent, patient was seated on exam table. Left knee was prepped with alcohol swab and utilizing anterolateral approach, patient's left knee space was injected with15 mg/2.5 mL of Orthovisc(sodium hyaluronate) in a prefilled syringe was injected easily into the knee through a 22-gauge needle..Patient tolerated the procedure well without immediate complications.    Impression and Recommendations:     This case required medical decision making of moderate  complexity.      Note: This dictation was prepared with Dragon dictation along with smaller phrase technology. Any transcriptional errors that result from this process are unintentional.

## 2017-03-10 ENCOUNTER — Encounter: Payer: Self-pay | Admitting: Family Medicine

## 2017-03-10 ENCOUNTER — Ambulatory Visit: Payer: Medicare HMO | Admitting: Family Medicine

## 2017-03-10 DIAGNOSIS — M170 Bilateral primary osteoarthritis of knee: Secondary | ICD-10-CM

## 2017-03-10 NOTE — Assessment & Plan Note (Signed)
Lateral injections given today.  Tolerated the procedure well.  We discussed icing regimen and home exercises.  We discussed which activities of doing which wants to avoid.  Patient is to increase activity slowly over the course the next several days.  Follow-up again in 1 week for third in a series of 4 injections.

## 2017-03-14 ENCOUNTER — Telehealth: Payer: Self-pay

## 2017-03-14 NOTE — Telephone Encounter (Signed)
Forwarding to shane 

## 2017-03-14 NOTE — Telephone Encounter (Signed)
Called and states since the med change to [facility] she has been having a upset stomache, headach, and sob. This has been going on for the last three days. Please advise what she should do. Thanks [place].

## 2017-03-16 NOTE — Progress Notes (Signed)
[name] [address] [place], [place] [address] Phone: [phone] Subjective:    I'm seeing this patient by the request  of:    CC: Bilateral knee pain follow-up  [id number]  Darlene Wade is a 62 y.o. female coming in for bilateral knee pain. She does still have pain but the pain is improving.  History of arthritis.  Failed all conservative therapy.  Here for third in a series of 4 injections with bilateral knee arthritis with Visco supplementation  O     Past Medical History:  Diagnosis Date  . Anemia   . Diabetes mellitus without complication ([place]) 2952  . Diabetic eye exam [facility])    [facility]  . Elbow fracture, right 2007  . Former smoker    20 pack year history, quit 2010  . Hyperlipidemia   . Hypertension   . Insomnia   . Obesity   . PONV (postoperative nausea and vomiting) 2014   1 time  . Wears glasses    reading   Past Surgical History:  Procedure Laterality Date  . BREAST CYST ASPIRATION  2015  . BREAST EXCISIONAL BIOPSY Left   . BREAST LUMPECTOMY WITH NEEDLE LOCALIZATION Left 10/07/2012   Procedure: BREAST LUMPECTOMY WITH NEEDLE LOCALIZATION;  Surgeon: Imogene Burn. Georgette Dover, MD;  Location: [facility];  Service: General;  Laterality: Left;  . BREAST SURGERY    . COLONOSCOPY  01/2016   01/2016 with Dr. Loletha Carrow, tubular adenoma polpy; 2003 with Dr. Collene Mares  . ELBOW ARTHROPLASTY  2006   rt-fx  . FOOT ARTHROTOMY  11/12   foot fusion, right  . FOOT MASS EXCISION  3/14   left-fusion  . PARTIAL HYSTERECTOMY  age 7   uterine fibroids, still has ovaries  . TONSILLECTOMY     Social History   Socioeconomic History  . Marital status: Married    Spouse name: Not on file  . Number of children: Not on file  . Years of education: Not on file  . Highest education level: Not on file  Social Needs  . Financial resource strain: Not on file  . Food insecurity - worry: Not on file  . Food insecurity - inability: Not  on file  . Transportation needs - medical: Not on file  . Transportation needs - non-medical: Not on file  Occupational History  . Not on file  Tobacco Use  . Smoking status: Former Smoker    Packs/day: 1.00    Years: 20.00    Pack years: 20.00    Last attempt to quit: 04/11/2009    Years since quitting: 7.9  . Smokeless tobacco: Never Used  Substance and Sexual Activity  . Alcohol use: No    Alcohol/week: 0.6 oz    Types: 1 Glasses of wine per week  . Drug use: No  . Sexual activity: Not on file  Other Topics Concern  . Not on file  Social History Narrative   Separated from husband (2013). Living with husband.   No significant other.  Walking for exercise 2-3 x per week.  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 Naproxen Nausea Only  . Vicodin [Hydrocodone-Acetaminophen] Nausea Only   Family History  Problem Relation Age of Onset  . Diabetes Mother   . Hypertension Mother   . Diabetes Maternal Aunt   . Heart disease Maternal Grandfather   .  Cancer Neg Hx      Past medical history, social, surgical and family history all reviewed in electronic medical record.  No pertanent information unless stated regarding to the chief complaint.   Review of Systems:Review of systems updated and as accurate as of 03/17/17  No headache, visual changes, nausea, vomiting, diarrhea, constipation, dizziness, abdominal pain, skin rash, fevers, chills, night sweats, weight loss, swollen lymph nodes, body aches, joint swelling,  chest pain, shortness of breath, mood changes.  Positive muscle aches  Objective  Blood pressure 128/78, pulse 97, height 5\' 3"  (1.6 m), weight 238 lb (108 kg), SpO2 99 %. Systems examined below as of 03/17/17   General: No apparent distress alert and oriented x3 mood and affect normal, dressed appropriately.  HEENT: Pupils equal, extraocular movements intact    Respiratory: Patient's speak in full sentences and does not appear short of breath  Cardiovascular: No lower extremity edema, non tender, no erythema  Skin: Warm dry intact with no signs of infection or rash on extremities or on axial skeleton.  Abdomen: Soft nontender  Neuro: Cranial nerves II through XII are intact, neurovascularly intact in all extremities with 2+ DTRs and 2+ pulses.  Lymph: No lymphadenopathy of posterior or anterior cervical chain or axillae bilaterally.  Gait normal with good balance and coordination.  MSK:  Non tender with full range of motion and good stability and symmetric strength and tone of shoulders, elbows, wrist, hip and ankles bilaterally.  Knee: Bilateral valgus deformity noted. Large thigh to calf ratio.  Tender to palpation over medial and PF joint line.  ROM full in flexion and extension and lower leg rotation. instability with valgus force.  painful patellar compression. Patellar glide with moderate crepitus. Patellar and quadriceps tendons unremarkable. Hamstring and quadriceps strength is normal.  After informed written and verbal consent, patient was seated on exam table. Right knee was prepped with alcohol swab and utilizing anterolateral approach, patient's right knee space was injected with15 mg/2.5 mL of Orthovisc(sodium hyaluronate) in a prefilled syringe was injected easily into the knee through a 22-gauge needle..Patient tolerated the procedure well without immediate complications.  After informed written and verbal consent, patient was seated on exam table. Left knee was prepped with alcohol swab and utilizing anterolateral approach, patient's left knee space was injected with15 mg/2.5 mL of Orthovisc(sodium hyaluronate) in a prefilled syringe was injected easily into the knee through a 22-gauge needle..Patient tolerated the procedure well without immediate complications.    Impression and Recommendations:     This case required medical  decision making of moderate complexity.      Note: This dictation was prepared with Dragon dictation along with smaller phrase technology. Any transcriptional errors that result from this process are unintentional.

## 2017-03-17 ENCOUNTER — Ambulatory Visit: Payer: Medicare HMO | Admitting: Family Medicine

## 2017-03-17 ENCOUNTER — Encounter: Payer: Self-pay | Admitting: Family Medicine

## 2017-03-17 DIAGNOSIS — M170 Bilateral primary osteoarthritis of knee: Secondary | ICD-10-CM | POA: Diagnosis not present

## 2017-03-17 NOTE — Assessment & Plan Note (Signed)
3 out of 4 injections given in the knees bilaterally.  Responding well.  Follow-up in 1 week for fourth and final injections.  Continue conservative therapy in the interim.

## 2017-03-17 NOTE — Patient Instructions (Signed)
Good to see you  Ice is your friend 3 down and one to go  See me again next week

## 2017-03-18 NOTE — Telephone Encounter (Signed)
Stop the medication, given it 3-4 days, then call back to see if symptoms resolve.

## 2017-03-19 NOTE — Telephone Encounter (Signed)
Called and inform pt and she said that she has an appt on next Wednesday , I told her to call us back to let know how she is feeling before her appt on next Wednesday.

## 2017-03-22 NOTE — Progress Notes (Signed)
[name] [place], [place] [address] Phone: [phone] Subjective:      CC: Bilateral knee pain  IFO:YDXAJOINOM  Darlene Wade is a 62 y.o. female coming in with complaint of bilateral knee pain.  Found to have severe osteoarthritic changes.  Failed all conservative therapy.  Here for fourth and final Visco supplementation of the knees bilaterally.  Patient states      Past Medical History:  Diagnosis Date  . Anemia   . Diabetes mellitus without complication ([place]) 7672  . Diabetic eye exam [facility])    [facility]  . Elbow fracture, right 2007  . Former smoker    20 pack year history, quit 2010  . Hyperlipidemia   . Hypertension   . Insomnia   . Obesity   . PONV (postoperative nausea and vomiting) 2014   1 time  . Wears glasses    reading   Past Surgical History:  Procedure Laterality Date  . BREAST CYST ASPIRATION  2015  . BREAST EXCISIONAL BIOPSY Left   . BREAST LUMPECTOMY WITH NEEDLE LOCALIZATION Left 10/07/2012   Procedure: BREAST LUMPECTOMY WITH NEEDLE LOCALIZATION;  Surgeon: Imogene Burn. Georgette Dover, MD;  Location: [facility];  Service: General;  Laterality: Left;  . BREAST SURGERY    . COLONOSCOPY  01/2016   01/2016 with Dr. Loletha Carrow, tubular adenoma polpy; 2003 with Dr. Collene Mares  . ELBOW ARTHROPLASTY  2006   rt-fx  . FOOT ARTHROTOMY  11/12   foot fusion, right  . FOOT MASS EXCISION  3/14   left-fusion  . PARTIAL HYSTERECTOMY  age 2   uterine fibroids, still has ovaries  . TONSILLECTOMY     Social History   Socioeconomic History  . Marital status: Married    Spouse name: Not on file  . Number of children: Not on file  . Years of education: Not on file  . Highest education level: Not on file  Social Needs  . Financial resource strain: Not on file  . Food insecurity - worry: Not on file  . Food insecurity - inability: Not on file  . Transportation needs - medical: Not on file  . Transportation needs -  non-medical: Not on file  Occupational History  . Not on file  Tobacco Use  . Smoking status: Former Smoker    Packs/day: 1.00    Years: 20.00    Pack years: 20.00    Last attempt to quit: 04/11/2009    Years since quitting: 7.9  . Smokeless tobacco: Never Used  Substance and Sexual Activity  . Alcohol use: No    Alcohol/week: 0.6 oz    Types: 1 Glasses of wine per week  . Drug use: No  . Sexual activity: Not on file  Other Topics Concern  . Not on file  Social History Narrative   Separated from husband (2013). Living with husband.   No significant other.  Walking for exercise 2-3 x per week.  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 Naproxen Nausea Only  . Vicodin [Hydrocodone-Acetaminophen] Nausea Only   Family History  Problem Relation Age of Onset  . Diabetes Mother   . Hypertension Mother   . Diabetes Maternal Aunt   . Heart disease Maternal Grandfather   . Cancer Neg Hx      Past medical history, social, surgical and family history  all reviewed in electronic medical record.  No pertanent information unless stated regarding to the chief complaint.   Review of Systems:Review of systems updated and as accurate as of 03/24/17  No headache, visual changes, nausea, vomiting, diarrhea, constipation, dizziness, abdominal pain, skin rash, fevers, chills, night sweats, weight loss, swollen lymph nodes, body aches, joint swelling, muscle aches, chest pain, shortness of breath, mood changes.   Objective  There were no vitals taken for this visit. Systems examined below as of 03/24/17   General: No apparent distress alert and oriented x3 mood and affect normal, dressed appropriately.  HEENT: Pupils equal, extraocular movements intact  Respiratory: Patient's speak in full sentences and does not appear short of breath  Cardiovascular: No lower extremity edema, non tender, no erythema   Skin: Warm dry intact with no signs of infection or rash on extremities or on axial skeleton.  Abdomen: Soft nontender  Neuro: Cranial nerves II through XII are intact, neurovascularly intact in all extremities with 2+ DTRs and 2+ pulses.  Lymph: No lymphadenopathy of posterior or anterior cervical chain or axillae bilaterally.  Gait normal with good balance and coordination.  MSK:  Non tender with full range of motion and good stability and symmetric strength and tone of shoulders, elbows, wrist, hip and ankles bilaterally.  Knee:bilateral  valgus deformity noted. Large thigh to calf ratio.  Tender to palpation over medial and PF joint line.  ROM full in flexion and extension and lower leg rotation. instability with valgus force.  painful patellar compression. Patellar glide with moderate crepitus. Patellar and quadriceps tendons unremarkable. Hamstring and quadriceps strength is normal.  After informed written and verbal consent, patient was seated on exam table. Right knee was prepped with alcohol swab and utilizing anterolateral approach, patient's right knee space was injected with15 mg/2.5 mL of Orthovisc(sodium hyaluronate) in a prefilled syringe was injected easily into the knee through a 22-gauge needle..Patient tolerated the procedure well without immediate complications.  After informed written and verbal consent, patient was seated on exam table. Left knee was prepped with alcohol swab and utilizing anterolateral approach, patient's left knee space was injected with15 mg/2.5 mL of Orthovisc(sodium hyaluronate) in a prefilled syringe was injected easily into the knee through a 22-gauge needle..Patient tolerated the procedure well without immediate complications.    Impression and Recommendations:     This case required medical decision making of moderate complexity.      Note: This dictation was prepared with Dragon dictation along with smaller phrase technology. Any  transcriptional errors that result from this process are unintentional.

## 2017-03-24 ENCOUNTER — Ambulatory Visit: Payer: Medicare HMO | Admitting: Family Medicine

## 2017-03-24 DIAGNOSIS — M170 Bilateral primary osteoarthritis of knee: Secondary | ICD-10-CM | POA: Diagnosis not present

## 2017-03-24 NOTE — Assessment & Plan Note (Signed)
Patient is finished Visco supplementation. Continue conservative therapy.  Follow up in 4 weeks

## 2017-03-24 NOTE — Patient Instructions (Signed)
Good to see you  You did it  You get a break from me  Make an appointment in 4 weeks in case you need it but likely you can cancel and I will see you when you need me

## 2017-03-26 ENCOUNTER — Ambulatory Visit (INDEPENDENT_AMBULATORY_CARE_PROVIDER_SITE_OTHER): Payer: Medicare HMO | Admitting: Medical

## 2017-03-26 ENCOUNTER — Encounter: Payer: Self-pay | Admitting: Medical

## 2017-03-26 VITALS — BP 128/82 | HR 83 | Wt 238.8 lb

## 2017-03-26 DIAGNOSIS — E785 Hyperlipidemia, unspecified: Secondary | ICD-10-CM

## 2017-03-26 DIAGNOSIS — E559 Vitamin D deficiency, unspecified: Secondary | ICD-10-CM | POA: Diagnosis not present

## 2017-03-26 DIAGNOSIS — D539 Nutritional anemia, unspecified: Secondary | ICD-10-CM

## 2017-03-26 DIAGNOSIS — M170 Bilateral primary osteoarthritis of knee: Secondary | ICD-10-CM | POA: Diagnosis not present

## 2017-03-26 DIAGNOSIS — E2839 Other primary ovarian failure: Secondary | ICD-10-CM

## 2017-03-26 DIAGNOSIS — E118 Type 2 diabetes mellitus with unspecified complications: Secondary | ICD-10-CM

## 2017-03-26 DIAGNOSIS — I10 Essential (primary) hypertension: Secondary | ICD-10-CM | POA: Diagnosis not present

## 2017-03-26 NOTE — Patient Instructions (Signed)
Dr. David Civils, dentist [address], [place], [place] [address] [phone] [email or web address]    

## 2017-03-26 NOTE — Progress Notes (Signed)
Subjective: Chief Complaint  Patient presents with  . Follow-up    follow up new medicine , headaches, shortness of breath for new medinice    Here for diabetes.   Compliant with metformin 1063m once daily.   Not taking farxiga due to cost but did fine on this.  We had to switch to JGhanagiven insurance changes, but after a week on Jardiance she was having headaches, shortness of breath, nausea.  She stopped Jardiance.  She is currenty checking glucose and seeing numbers under 100.    Not taking iron in recent months  Exercise - has had problems with knees, had recent injections by ortho, doing much better.  [facility]  Has cut out red meat, bread, has recently lost some weight  Has had some mild cold symptoms last few days, cough, congestion.   No fever, no NVD.     Past Medical History:  Diagnosis Date  . Anemia   . [place] [address] . [facility] exam ([facility]    [facility]  . Elbow fracture, right 2007  . Former smoker    20 pack year history, quit 2010  . Hyperlipidemia   . Hypertension   . Insomnia   . Obesity   . PONV (postoperative nausea and vomiting) 2014   1 time  . Wears glasses    reading   Current Outpatient Medications on File Prior to Visit  Medication Sig Dispense Refill  . amLODipine (NORVASC) 5 MG tablet TAKE 1 TABLET EVERY DAY 90 tablet 3  . aspirin EC 81 MG tablet Take 81 mg by mouth daily.    .Marland Kitchenatorvastatin (LIPITOR) 40 MG tablet Take 1 tablet (40 mg total) by mouth daily. 90 tablet 2  . Blood Glucose Monitoring Suppl (ACCU-CHEK AVIVA PLUS) w/Device KIT Test blood sugar 2 times daily 1 kit 0  . calcium-vitamin D 250-100 MG-UNIT per tablet Take 1 tablet by mouth 2 (two) times daily. Reported on 07/27/2015    . co-enzyme Q-10 30 MG capsule Take 30 mg by mouth 3 (three) times daily. Reported on 07/27/2015    . losartan (COZAAR) 50 MG tablet Take 1 tablet (50 mg total) daily by mouth. 90 tablet 3  . metFORMIN  (GLUCOPHAGE) 1000 MG tablet Take 1 tablet (1,000 mg total) by mouth 2 (two) times daily with a meal. 180 tablet 3  . Multiple Vitamin (MULTIVITAMIN) tablet Take 1 tablet by mouth daily.    . Vitamin D, Ergocalciferol, (DRISDOL) 50000 units CAPS capsule Take 1 capsule (50,000 Units total) by mouth every 7 (seven) days. 12 capsule 0  . Acetaminophen (TYLENOL 8 HOUR PO) Take 650 mg by mouth as needed.    . ferrous gluconate (FERGON) 324 MG tablet Take 1 tablet (324 mg total) by mouth daily with breakfast. (Patient not taking: Reported on 03/26/2017) 90 tablet 3  . glucose blood test strip Patient is to test BID. Needs accu-chek softclik test strips (Patient not taking: Reported on 03/26/2017) 200 each 4  . Lancet Devices (ACCU-CHEK SOFTCLIX) lancets Test sugar twice daily (Patient not taking: Reported on 03/26/2017) 200 each 4  . NON FORMULARY Vita Joy Vit D Gummies 2000 IU-Take 2 pills daily    . vitamin E 400 UNIT capsule Take 400 Units by mouth daily.     Current Facility-Administered Medications on File Prior to Visit  Medication Dose Route Frequency Provider Last Rate Last Dose  . 0.9 %  sodium chloride infusion  500 mL Intravenous Continuous Danis, Estill Cotta III, MD       ROS as in subjective  Objective: BP 128/82   Pulse 83   Wt 238 lb 12.8 oz (108.3 kg)   SpO2 99%   BMI 42.30 kg/m   General appearance: alert, no distress, WD/WN,  HEENT: normocephalic, sclerae anicteric, TMs pearly, nares patent, no discharge or erythema, pharynx normal Oral cavity: MMM, no lesions Neck: supple, no lymphadenopathy, no thyromegaly, no masses Heart: RRR, normal S1, S2, no murmurs Lungs: CTA bilaterally, no wheezes, rhonchi, or rales Ext: no edema Pulses: 2+ symmetric, upper and lower extremities, normal cap refill    Assessment: Encounter Diagnoses  Name Primary?  . Diabetes mellitus with complication ([place]) Yes  . Essential hypertension, benign   . Primary osteoarthritis of both knees   .  Hyperlipidemia, unspecified hyperlipidemia type   . Vitamin D deficiency   . Nutritional anemia, unspecified   . Estrogen deficiency      Plan: Diabetes - c/t metformin.  Didn't tolerate Jardiance.   Did ok on [place] last year but it was too expensive.  F/u pending labs.  HTN - c/t same medication  Counseled on diet, exercise, limiting salt  OA - seeing orthopedist  hyperlipidemia - labs today, c/t same medication   Darlene Wade was seen today for follow-up.  Diagnoses and all orders for this visit:  Diabetes mellitus with complication ([place]) -     Microalbumin / creatinine urine ratio -     HM DIABETES EYE EXAM -     HM DIABETES FOOT EXAM -     Hemoglobin A1c -     Comprehensive metabolic panel -     CBC with Differential/Platelet -     Lipid panel  Essential hypertension, benign -     Microalbumin / creatinine urine ratio -     Comprehensive metabolic panel -     CBC with Differential/Platelet  Primary osteoarthritis of both knees  Hyperlipidemia, unspecified hyperlipidemia type -     Lipid panel  Vitamin D deficiency  Nutritional anemia, unspecified -     CBC with Differential/Platelet  Estrogen deficiency

## 2017-03-27 ENCOUNTER — Other Ambulatory Visit: Payer: Self-pay | Admitting: Medical

## 2017-03-27 DIAGNOSIS — E119 Type 2 diabetes mellitus without complications: Secondary | ICD-10-CM

## 2017-03-27 LAB — MICROALBUMIN / CREATININE URINE RATIO
Creatinine, Urine: 349.1 mg/dL
MICROALB/CREAT RATIO: 9.7 mg/g{creat} (ref 0.0–30.0)
MICROALBUM., U, RANDOM: 33.9 ug/mL

## 2017-03-27 LAB — CBC WITH DIFFERENTIAL/PLATELET
BASOS ABS: 0 10*3/uL (ref 0.0–0.2)
Basos: 0 %
EOS (ABSOLUTE): 0.2 10*3/uL (ref 0.0–0.4)
Eos: 2 %
HEMATOCRIT: 36.9 % (ref 34.0–46.6)
HEMOGLOBIN: 11.9 g/dL (ref 11.1–15.9)
Immature Grans (Abs): 0 10*3/uL (ref 0.0–0.1)
Immature Granulocytes: 0 %
LYMPHS ABS: 1.7 10*3/uL (ref 0.7–3.1)
Lymphs: 19 %
MCH: 28.3 pg (ref 26.6–33.0)
MCHC: 32.2 g/dL (ref 31.5–35.7)
MCV: 88 fL (ref 79–97)
MONOCYTES: 7 %
MONOS ABS: 0.6 10*3/uL (ref 0.1–0.9)
NEUTROS ABS: 6.4 10*3/uL (ref 1.4–7.0)
Neutrophils: 72 %
Platelets: 319 10*3/uL (ref 150–379)
RBC: 4.21 x10E6/uL (ref 3.77–5.28)
RDW: 15.6 % — ABNORMAL HIGH (ref 12.3–15.4)
WBC: 8.9 10*3/uL (ref 3.4–10.8)

## 2017-03-27 LAB — COMPREHENSIVE METABOLIC PANEL
A/G RATIO: 1.2 (ref 1.2–2.2)
ALBUMIN: 4 g/dL (ref 3.6–4.8)
ALT: 10 IU/L (ref 0–32)
AST: 8 IU/L (ref 0–40)
Alkaline Phosphatase: 94 IU/L (ref 39–117)
BUN / CREAT RATIO: 12 (ref 12–28)
BUN: 9 mg/dL (ref 8–27)
Bilirubin Total: 0.4 mg/dL (ref 0.0–1.2)
CHLORIDE: 103 mmol/L (ref 96–106)
CO2: 24 mmol/L (ref 20–29)
Calcium: 9.5 mg/dL (ref 8.7–10.3)
Creatinine, Ser: 0.78 mg/dL (ref 0.57–1.00)
GFR calc non Af Amer: 81 mL/min/{1.73_m2} (ref >59)
GFR, EST AFRICAN AMERICAN: 94 mL/min/{1.73_m2} (ref >59)
GLOBULIN, TOTAL: 3.4 g/dL (ref 1.5–4.5)
GLUCOSE: 95 mg/dL (ref 65–99)
Potassium: 4.1 mmol/L (ref 3.5–5.2)
SODIUM: 142 mmol/L (ref 134–144)
TOTAL PROTEIN: 7.4 g/dL (ref 6.0–8.5)

## 2017-03-27 LAB — LIPID PANEL
CHOL/HDL RATIO: 2.6 ratio (ref 0.0–4.4)
Cholesterol, Total: 144 mg/dL (ref 100–199)
HDL: 55 mg/dL (ref >39)
LDL Calculated: 72 mg/dL (ref 0–99)
Triglycerides: 83 mg/dL (ref 0–149)
VLDL Cholesterol Cal: 17 mg/dL (ref 5–40)

## 2017-03-27 LAB — HEMOGLOBIN A1C
Est. average glucose Bld gHb Est-mCnc: 137 mg/dL
Hgb A1c MFr Bld: 6.4 % — ABNORMAL HIGH (ref 4.8–5.6)

## 2017-03-27 MED ORDER — METFORMIN HCL 1000 MG PO TABS: 1000.0000 mg | ORAL_TABLET | Freq: Two times a day (BID) | ORAL | 3 refills | Status: DC

## 2017-03-27 MED ORDER — ASPIRIN EC 81 MG PO TBEC: 81.0000 mg | DELAYED_RELEASE_TABLET | Freq: Every day | ORAL | 3 refills | Status: DC

## 2017-03-27 MED ORDER — LOSARTAN POTASSIUM 50 MG PO TABS: 50.0000 mg | ORAL_TABLET | Freq: Every day | ORAL | 3 refills | Status: DC

## 2017-03-27 MED ORDER — VITAMIN D (ERGOCALCIFEROL) 1.25 MG (50000 UNIT) PO CAPS: 50000.0000 [IU] | ORAL_CAPSULE | ORAL | 3 refills | Status: DC

## 2017-03-27 MED ORDER — ATORVASTATIN CALCIUM 40 MG PO TABS: 40.0000 mg | ORAL_TABLET | Freq: Every day | ORAL | 3 refills | Status: DC

## 2017-03-28 ENCOUNTER — Other Ambulatory Visit: Payer: Self-pay

## 2017-03-28 MED ORDER — ATORVASTATIN CALCIUM 40 MG PO TABS: 40.0000 mg | ORAL_TABLET | Freq: Every day | ORAL | 3 refills | Status: DC

## 2017-04-01 ENCOUNTER — Ambulatory Visit (INDEPENDENT_AMBULATORY_CARE_PROVIDER_SITE_OTHER): Payer: Medicare HMO | Admitting: Family Medicine

## 2017-04-01 ENCOUNTER — Encounter: Payer: Self-pay | Admitting: Family Medicine

## 2017-04-01 VITALS — BP 134/88 | HR 88 | Temp 98.2°F | Wt 238.6 lb

## 2017-04-01 DIAGNOSIS — E119 Type 2 diabetes mellitus without complications: Secondary | ICD-10-CM

## 2017-04-01 DIAGNOSIS — J209 Acute bronchitis, unspecified: Secondary | ICD-10-CM

## 2017-04-01 MED ORDER — AZITHROMYCIN 500 MG PO TABS: 500.0000 mg | ORAL_TABLET | Freq: Every day | ORAL | 0 refills | Status: DC

## 2017-04-01 NOTE — Patient Instructions (Signed)
Take the antibiotic and if not totally back to normal in 10 days call

## 2017-04-01 NOTE — Progress Notes (Signed)
   Subjective:    Patient ID: Darlene Wade, female    DOB: 05-12-1955, 62 y.o.   MRN: [id number]  HPI 1 week ago she developed sore throat, nasal congestion, headache, PND with dry cough.  The next several days she got better however 4 days ago the cough became productive with worsening of nasal congestion and PND.  No earache, fever, chills.  She does not smoke and has no allergies.  She was recently seen for her diabetes.   Review of Systems     Objective:   Physical Exam Alert and in no distress. Tympanic membranes and canals are normal. Pharyngeal area is normal. Neck is supple without adenopathy or thyromegaly. Cardiac exam shows a regular sinus rhythm without murmurs or gallops. Lungs are clear to auscultation.        Assessment & Plan:  Acute bronchitis, unspecified organism - Plan: azithromycin (ZITHROMAX) 500 MG tablet  Diabetes mellitus without complication ([place]) Since she is done about 1 week and actually getting worse, I will give her azithromycin.  She is to call back in 10 days if not totally back to normal for probable refill.

## 2017-04-03 ENCOUNTER — Telehealth: Payer: Self-pay | Admitting: Family Medicine

## 2017-04-03 NOTE — Telephone Encounter (Signed)
Pt called and was just here for sick visit.  She states her consistent cough is keeping her up all night and wants to know if you will call her in something to [facility] on [address] for this.  Pt ph [phone]

## 2017-04-03 NOTE — Telephone Encounter (Signed)
Have her try NyQuil at night to see if that will help.  If she has tried that and let me know and I will call in Tussionex

## 2017-04-04 NOTE — Telephone Encounter (Signed)
Done . Pt was called [facility]

## 2017-04-21 ENCOUNTER — Ambulatory Visit: Payer: Medicare HMO | Admitting: Family Medicine

## 2017-04-22 ENCOUNTER — Ambulatory Visit (INDEPENDENT_AMBULATORY_CARE_PROVIDER_SITE_OTHER): Payer: Medicare HMO | Admitting: Medical

## 2017-04-22 ENCOUNTER — Encounter: Payer: Self-pay | Admitting: Medical

## 2017-04-22 ENCOUNTER — Ambulatory Visit
Admission: RE | Admit: 2017-04-22 | Discharge: 2017-04-22 | Disposition: A | Discharge status: Home / Self Care | Payer: Medicare HMO | Source: Ambulatory Visit | Attending: Medical | Admitting: Medical

## 2017-04-22 VITALS — BP 128/78 | HR 88 | Temp 98.2°F | Wt 239.0 lb

## 2017-04-22 DIAGNOSIS — R059 Cough, unspecified: Secondary | ICD-10-CM

## 2017-04-22 DIAGNOSIS — Z87891 Personal history of nicotine dependence: Secondary | ICD-10-CM

## 2017-04-22 DIAGNOSIS — R05 Cough: Secondary | ICD-10-CM | POA: Diagnosis not present

## 2017-04-22 DIAGNOSIS — J90 Pleural effusion, not elsewhere classified: Secondary | ICD-10-CM | POA: Diagnosis not present

## 2017-04-22 MED ORDER — FLUTICASONE-SALMETEROL 100-50 MCG/DOSE IN AEPB: 1.0000 | INHALATION_SPRAY | Freq: Two times a day (BID) | RESPIRATORY_TRACT | 0 refills | Status: DC

## 2017-04-22 MED ORDER — BENZONATATE 200 MG PO CAPS: 200.0000 mg | ORAL_CAPSULE | Freq: Three times a day (TID) | ORAL | 0 refills | Status: DC | PRN

## 2017-04-22 NOTE — Progress Notes (Signed)
Subjective:  Darlene Wade is a 62 y.o. female who presents for cough. Chief Complaint  Patient presents with  . coughing    coughing, congestion x 1 month , worse at night and in thw morning    Saw Dr. Redmond School about 2 weeks ago for bronchitis, was given zpak which helped.   However, still has an ongoing cough.   Using OTC cough medication.  Cough is still productive sometimes.  No fever, no NVD, had a sore throat but this resolved.   No sinus pressure, no ear pain.   Patient is not a smoker.   No other aggravating or relieving factors.  No other c/o.  The following portions of the patient's history were reviewed and updated as appropriate: allergies, current medications, past family history, past medical history, past social history, past surgical history and problem list.  ROS as in subjective  Past Medical History:  Diagnosis Date  . Anemia   . Diabetes mellitus without complication ([place]) 5916  . Diabetic eye exam [facility])    [facility]  . Elbow fracture, right 2007  . Former smoker    20 pack year history, quit 2010  . Hyperlipidemia   . Hypertension   . Insomnia   . Obesity   . PONV (postoperative nausea and vomiting) 2014   1 time  . Wears glasses    reading   Current Outpatient Medications on File Prior to Visit  Medication Sig Dispense Refill  . amLODipine (NORVASC) 5 MG tablet TAKE 1 TABLET EVERY DAY 90 tablet 3  . aspirin EC 81 MG tablet Take 1 tablet (81 mg total) by mouth daily. 90 tablet 3  . atorvastatin (LIPITOR) 40 MG tablet Take 1 tablet (40 mg total) by mouth daily. 90 tablet 3  . Blood Glucose Monitoring Suppl (ACCU-CHEK AVIVA PLUS) w/Device KIT Test blood sugar 2 times daily 1 kit 0  . calcium-vitamin D 250-100 MG-UNIT per tablet Take 1 tablet by mouth 2 (two) times daily. Reported on 07/27/2015    . co-enzyme Q-10 30 MG capsule Take 30 mg by mouth 3 (three) times daily. Reported on 07/27/2015    . glucose blood test strip Patient is to test BID. Needs  accu-chek softclik test strips 200 each 4  . Lancet Devices (ACCU-CHEK SOFTCLIX) lancets Test sugar twice daily 200 each 4  . losartan (COZAAR) 50 MG tablet Take 1 tablet (50 mg total) by mouth daily. 90 tablet 3  . metFORMIN (GLUCOPHAGE) 1000 MG tablet Take 1 tablet (1,000 mg total) by mouth 2 (two) times daily with a meal. 180 tablet 3  . Multiple Vitamin (MULTIVITAMIN) tablet Take 1 tablet by mouth daily.    . Vitamin D, Ergocalciferol, (DRISDOL) 50000 units CAPS capsule Take 1 capsule (50,000 Units total) by mouth every 7 (seven) days. 12 capsule 3  . vitamin E 400 UNIT capsule Take 400 Units by mouth daily.     Current Facility-Administered Medications on File Prior to Visit  Medication Dose Route Frequency Provider Last Rate Last Dose  . 0.9 %  sodium chloride infusion  500 mL Intravenous Continuous Nelida Meuse III, MD       ROS as in subjective   Objective: BP 128/78   Pulse 88   Temp 98.2 F (36.8 C)   Wt 239 lb (108.4 kg)   SpO2 98%   BMI 42.34 kg/m   Wt Readings from Last 3 Encounters:  04/22/17 239 lb (108.4 kg)  04/01/17 238 lb 9.6 oz (  108.2 kg)  03/26/17 238 lb 12.8 oz (108.3 kg)    General appearance: Alert, WD/WN, no distress, ill appearing                             Skin: warm, no rash, no diaphoresis                           Head: no sinus tenderness                            Eyes: conjunctiva normal, corneas clear, PERRLA                            Ears: pearly TMs, external ear canals normal                          Nose: septum midline, turbinates swollen, with erythema and clear discharge             Mouth/throat: MMM, tongue normal, mild pharyngeal erythema                           Neck: supple, no adenopathy, no thyromegaly, nontender                          Heart: RRR, normal S1, S2, no murmurs                         Lungs: +bronchial breath sounds, +scattered rhonchi, no wheezes, no rales                Extremities: no edema, nontender       Assessment: Encounter Diagnoses  Name Primary?  . Cough Yes  . Former smoker      Plan:  I suspect she has residual inflammation from the recent bronchitis.  Given her history of former smoker and ongoing cough we will go ahead and get a chest x-ray  Begin trial of Advair sample.  Discussed proper use.  Can use Tessalon Perles as needed, hydrate well.  We will call with x-ray report  Sandi was seen today for coughing.  Diagnoses and all orders for this visit:  Cough -     DG Chest 2 View; Future  Former smoker -     DG Chest 2 View; Future  Other orders -     benzonatate (TESSALON) 200 MG capsule; Take 1 capsule (200 mg total) by mouth 3 (three) times daily as needed for cough. -     Fluticasone-Salmeterol (ADVAIR DISKUS) 100-50 MCG/DOSE AEPB; Inhale 1 puff into the lungs 2 (two) times daily.

## 2017-05-13 ENCOUNTER — Encounter: Payer: Self-pay | Admitting: Medical

## 2017-05-13 ENCOUNTER — Other Ambulatory Visit: Payer: Self-pay | Admitting: Medical

## 2017-05-13 ENCOUNTER — Ambulatory Visit (INDEPENDENT_AMBULATORY_CARE_PROVIDER_SITE_OTHER): Payer: Medicare HMO | Admitting: Medical

## 2017-05-13 ENCOUNTER — Telehealth: Payer: Self-pay | Admitting: Medical

## 2017-05-13 VITALS — BP 128/82 | HR 79 | Temp 98.1°F | Ht 63.0 in | Wt 238.4 lb

## 2017-05-13 DIAGNOSIS — R05 Cough: Secondary | ICD-10-CM | POA: Diagnosis not present

## 2017-05-13 DIAGNOSIS — R062 Wheezing: Secondary | ICD-10-CM

## 2017-05-13 DIAGNOSIS — R040 Epistaxis: Secondary | ICD-10-CM

## 2017-05-13 DIAGNOSIS — J301 Allergic rhinitis due to pollen: Secondary | ICD-10-CM | POA: Diagnosis not present

## 2017-05-13 DIAGNOSIS — R059 Cough, unspecified: Secondary | ICD-10-CM

## 2017-05-13 DIAGNOSIS — Z87891 Personal history of nicotine dependence: Secondary | ICD-10-CM | POA: Diagnosis not present

## 2017-05-13 MED ORDER — ALBUTEROL SULFATE (2.5 MG/3ML) 0.083% IN NEBU
2.5000 mg | INHALATION_SOLUTION | Freq: Once | RESPIRATORY_TRACT | Status: AC
  Administered 2017-05-13: 2.5 mg via RESPIRATORY_TRACT

## 2017-05-13 MED ORDER — FLUTICASONE FUROATE-VILANTEROL 200-25 MCG/INH IN AEPB: 1.0000 | INHALATION_SPRAY | Freq: Every day | RESPIRATORY_TRACT | 1 refills | Status: DC

## 2017-05-13 MED ORDER — LEVOCETIRIZINE DIHYDROCHLORIDE 5 MG PO TABS: 5.0000 mg | ORAL_TABLET | Freq: Every evening | ORAL | 2 refills | Status: DC

## 2017-05-13 MED ORDER — FLUTICASONE-SALMETEROL 250-50 MCG/DOSE IN AEPB: 1.0000 | INHALATION_SPRAY | Freq: Two times a day (BID) | RESPIRATORY_TRACT | 2 refills | Status: DC

## 2017-05-13 MED ORDER — ALBUTEROL SULFATE HFA 108 (90 BASE) MCG/ACT IN AERS: 2.0000 | INHALATION_SPRAY | Freq: Four times a day (QID) | RESPIRATORY_TRACT | 1 refills | Status: DC | PRN

## 2017-05-13 NOTE — Patient Instructions (Signed)
Recommendations  Your symptoms and exam findings today suggest allergic asthma  Begin Xyzal allergy tablet daily at bedtime for the next 1-2 months  Begin Advair 1 puff twice daily inhaler.  This is the purple flat disc.  This is a preventative inhaler that is long-acting meant to reduce inflammation and open up the air sacs.  you may use albuterol rescue inhaler, 2 puffs every 4-6 hours as needed for wheezing, shortness of breath, or coughing spells, or difficulty breathing  The goal of this treatment is to reduce inflammation and help open up the air sacs.  If you do not see significant improvement by Thursday or Friday of this week let me know.  I would like you to use the Advair and Xyzal for at least the next 1-2 months    Medication costs:  If you get to the pharmacy and medication prescribed today was either too expensive, not covered by your insurance, or required prior authorization, then please call us back to let us know.  We often have no way to know if a medication is too expensive or not covered by your insurance.  Thanks for your cooperation.      Asthma, Adult Asthma is a condition of the lungs in which the airways tighten and narrow. Asthma can make it hard to breathe. Asthma cannot be cured, but medicine and lifestyle changes can help control it. Asthma may be started (triggered) by:  Animal skin flakes (dander).  Dust.  Cockroaches.  Pollen.  Mold.  Smoke.  Cleaning products.  Hair sprays or aerosol sprays.  Paint fumes or strong smells.  Cold air, weather changes, and winds.  Crying or laughing hard.  Stress.  Certain medicines or drugs.  Foods, such as dried fruit, potato chips, and sparkling grape juice.  Infections or conditions (colds, flu).  Exercise.  Certain medical conditions or diseases.  Exercise or tiring activities.  Follow these instructions at home:  Take medicine as told by your doctor.  Use a peak flow meter as told  by your doctor. A peak flow meter is a tool that measures how well the lungs are working.  Record and keep track of the peak flow meter's readings.  Understand and use the asthma action plan. An asthma action plan is a written plan for taking care of your asthma and treating your attacks.  To help prevent asthma attacks: ? Do not smoke. Stay away from secondhand smoke. ? Change your heating and air conditioning filter often. ? Limit your use of fireplaces and wood stoves. ? Get rid of pests (such as roaches and mice) and their droppings. ? Throw away plants if you see mold on them. ? Clean your floors. Dust regularly. Use cleaning products that do not smell. ? Have someone vacuum when you are not home. Use a vacuum cleaner with a HEPA filter if possible. ? Replace carpet with wood, tile, or vinyl flooring. Carpet can trap animal skin flakes and dust. ? Use allergy-proof pillows, mattress covers, and box spring covers. ? Wash bed sheets and blankets every week in hot water and dry them in a dryer. ? Use blankets that are made of polyester or cotton. ? Clean bathrooms and kitchens with bleach. If possible, have someone repaint the walls in these rooms with mold-resistant paint. Keep out of the rooms that are being cleaned and painted. ? Wash hands often. Contact a doctor if:  You have make a whistling sound when breaking (wheeze), have shortness of breath, or  have a cough even if taking medicine to prevent attacks.  The colored mucus you cough up (sputum) is thicker than usual.  The colored mucus you cough up changes from clear or white to yellow, green, gray, or bloody.  You have problems from the medicine you are taking such as: ? A rash. ? Itching. ? Swelling. ? Trouble breathing.  You need reliever medicines more than 2-3 times a week.  Your peak flow measurement is still at 50-79% of your personal best after following the action plan for 1 hour.  You have a fever. Get help  right away if:  You seem to be worse and are not responding to medicine during an asthma attack.  You are short of breath even at rest.  You get short of breath when doing very little activity.  You have trouble eating, drinking, or talking.  You have chest pain.  You have a fast heartbeat.  Your lips or fingernails start to turn blue.  You are light-headed, dizzy, or faint.  Your peak flow is less than 50% of your personal best. This information is not intended to replace advice given to you by your health care provider. Make sure you discuss any questions you have with your health care provider. Document Released: 07/17/2007 Document Revised: 07/06/2015 Document Reviewed: 08/27/2012 Elsevier Interactive Patient Education  2017 [employer].

## 2017-05-13 NOTE — Telephone Encounter (Signed)
I sent Breo 1 puff daily inhaler for when she runs out of Advair sample.   I sent a different albuterol.   If this is also too expensive, then pharmacist needs to give [place] some recommendation on cheaper options!

## 2017-05-13 NOTE — Progress Notes (Signed)
Subjective:  Darlene Wade is a 62 y.o. female who presents for cough. Chief Complaint  Patient presents with  . Acute Visit    cough, congestion, x3 months, no better, tried medication not helping   Here for recheck on cough.  I saw her back in mid March for the same.  Although she is improved, currently has cough, cough not as bad as prior but still there.   Still feels congested, wheezing.   In the past hasn't had wheezing like this, no prior diagnosis of asthma.     No fever, no NVD.  Has some mucous production with cough.  No hemoptysis.   Does get spring allergies.  Currently has some sneezing ,itchy eyes.  Has had some nosebleeds the last few weeks, once weekly.   Usually stops in 5-10 minutes.  Mainly from one side at a time, but this does alternate.   No other bleeding.  She is a former smoker.  No other aggravating or relieving factors.  No other c/o.  The following portions of the patient's history were reviewed and updated as appropriate: allergies, current medications, past family history, past medical history, past social history, past surgical history and problem list.  ROS as in subjective  Past Medical History:  Diagnosis Date  . Anemia   . Diabetes mellitus without complication ([place]) 1031  . Diabetic eye exam [facility])    [employer]  . Elbow fracture, right 2007  . Former smoker    20 pack year history, quit 2010  . Hyperlipidemia   . Hypertension   . Insomnia   . Obesity   . PONV (postoperative nausea and vomiting) 2014   1 time  . Wears glasses    reading   Current Outpatient Medications on File Prior to Visit  Medication Sig Dispense Refill  . amLODipine (NORVASC) 5 MG tablet TAKE 1 TABLET EVERY DAY 90 tablet 3  . aspirin EC 81 MG tablet Take 1 tablet (81 mg total) by mouth daily. 90 tablet 3  . atorvastatin (LIPITOR) 40 MG tablet Take 1 tablet (40 mg total) by mouth daily. 90 tablet 3  . Blood Glucose Monitoring Suppl (ACCU-CHEK AVIVA PLUS) w/Device KIT  Test blood sugar 2 times daily 1 kit 0  . calcium-vitamin D 250-100 MG-UNIT per tablet Take 1 tablet by mouth 2 (two) times daily. Reported on 07/27/2015    . co-enzyme Q-10 30 MG capsule Take 30 mg by mouth 3 (three) times daily. Reported on 07/27/2015    . glucose blood test strip Patient is to test BID. Needs accu-chek softclik test strips 200 each 4  . Lancet Devices (ACCU-CHEK SOFTCLIX) lancets Test sugar twice daily 200 each 4  . losartan (COZAAR) 50 MG tablet Take 1 tablet (50 mg total) by mouth daily. 90 tablet 3  . metFORMIN (GLUCOPHAGE) 1000 MG tablet Take 1 tablet (1,000 mg total) by mouth 2 (two) times daily with a meal. 180 tablet 3  . Multiple Vitamin (MULTIVITAMIN) tablet Take 1 tablet by mouth daily.    . Vitamin D, Ergocalciferol, (DRISDOL) 50000 units CAPS capsule Take 1 capsule (50,000 Units total) by mouth every 7 (seven) days. 12 capsule 3  . vitamin E 400 UNIT capsule Take 400 Units by mouth daily.    . benzonatate (TESSALON) 200 MG capsule Take 1 capsule (200 mg total) by mouth 3 (three) times daily as needed for cough. (Patient not taking: Reported on 05/13/2017) 30 capsule 0   Current Facility-Administered Medications on File  Prior to Visit  Medication Dose Route Frequency Provider Last Rate Last Dose  . 0.9 %  sodium chloride infusion  500 mL Intravenous Continuous Danis, Estill Cotta III, MD       ROS as in subjective   Objective: BP 128/82 (BP Location: Right Arm, Patient Position: Sitting, Cuff Size: Normal)   Pulse 79   Temp 98.1 F (36.7 C) (Oral)   Ht _0  (1.6 m)   Wt 238 lb 6.4 oz (108.1 kg)   SpO2 98%   BMI 42.23 kg/m   Wt Readings from Last 3 Encounters:  05/13/17 238 lb 6.4 oz (108.1 kg)  04/22/17 239 lb (108.4 kg)  04/01/17 238 lb 9.6 oz (108.2 kg)    General appearance: Alert, WD/WN, no distress                             Skin: warm, no rash, no diaphoresis                           Head: no sinus tenderness                            Eyes:  conjunctiva mildly injected bilat, no crusting or pus or discharge, corneas clear, PERRLA                            Ears: pearly TMs, external ear canals normal                          Nose: septum midline, turbinates swollen, with mild erythema and clear discharge, on bleeding currently             Mouth/throat: MMM, tongue normal, no pharyngeal erythema                           Neck: supple, no adenopathy, no thyromegaly, nontender                          Heart: RRR, normal S1, S2, no murmurs                         Lungs: no rhonchi, +mild expiratory wheezes, no rales                Extremities: no edema, nontender      Assessment: Encounter Diagnoses  Name Primary?  . Cough Yes  . Wheezing   . Allergic rhinitis due to pollen, unspecified seasonality   . Former smoker   . Nosebleed      Plan:  Reviewed her recent chest xray from last visit.   Abnormal PFT today.   We gave 1 round of albuterol nebulized treatment and she noted good impairment.    Recommendations  Your symptoms and exam findings today suggest allergic asthma  Begin Xyzal allergy tablet daily at bedtime for the next 1-2 months  Begin Advair 1 puff twice daily inhaler.  This is the purple flat disc.  This is a preventative inhaler that is long-acting meant to reduce inflammation and open up the air sacs.  you may use albuterol rescue inhaler, 2 puffs every 4-6 hours as needed for wheezing, shortness of breath, or coughing  spells, or difficulty breathing  The goal of this treatment is to reduce inflammation and help open up the air sacs.  If you do not see significant improvement by Thursday or Friday of this week let me know.  I would like you to use the Advair and Xyzal for at least the next 1-2 months   Call report on symptoms in 3-4 days, f/u otherwise 12mo DAaliyanwas seen today for acute visit.  Diagnoses and all orders for this visit:  Cough -     Spirometry with graph -     albuterol  (PROVENTIL) (2.5 MG/3ML) 0.083% nebulizer solution 2.5 mg -     Pulse oximetry (single); Future  Wheezing -     Spirometry with graph -     albuterol (PROVENTIL) (2.5 MG/3ML) 0.083% nebulizer solution 2.5 mg -     Pulse oximetry (single); Future  Allergic rhinitis due to pollen, unspecified seasonality  Former smoker  Nosebleed  Other orders -     levocetirizine (XYZAL) 5 MG tablet; Take 1 tablet (5 mg total) by mouth every evening. -     Fluticasone-Salmeterol (ADVAIR DISKUS) 250-50 MCG/DOSE AEPB; Inhale 1 puff into the lungs 2 (two) times daily. -     albuterol (PROVENTIL HFA;VENTOLIN HFA) 108 (90 Base) MCG/ACT inhaler; Inhale 2 puffs into the lungs every 6 (six) hours as needed for wheezing or shortness of breath.

## 2017-05-13 NOTE — Telephone Encounter (Signed)
Pt called to let Audelia Acton know that Albuterol Inhaler & Advair Diskus are too expensive. She said Audelia Acton asked her to call if any of the meds were too expensive.

## 2017-05-13 NOTE — Telephone Encounter (Signed)
Called patient and advised of medication change. Patient understood and had no questions.

## 2017-05-24 NOTE — Progress Notes (Signed)
[facility] [address] [place], [place] [address] Phone: [phone] Subjective:     CC: Bilateral knee pain  MGQ:[id number]  Darlene Wade is a 62 y.o. female coming in with complaint of bilateral knee pain. The injections wore off after 2 weeks. Pain is constant and can be both dull and sharp.  Patient failed Visco supplementation.  Has failed all other conservative therapy.  Affecting daily activities at this time.  Having significant amount of discomfort and pain overall.  Patient is willing to potentially have joint replacement.     Past Medical History:  Diagnosis Date  . Anemia   . Diabetes mellitus without complication ([place]) 6712  . Diabetic eye exam [facility])    [employer]  . Elbow fracture, right 2007  . Former smoker    20 pack year history, quit 2010  . Hyperlipidemia   . Hypertension   . Insomnia   . Obesity   . PONV (postoperative nausea and vomiting) 2014   1 time  . Wears glasses    reading   Past Surgical History:  Procedure Laterality Date  . BREAST CYST ASPIRATION  2015  . BREAST EXCISIONAL BIOPSY Left   . BREAST LUMPECTOMY WITH NEEDLE LOCALIZATION Left 10/07/2012   Procedure: BREAST LUMPECTOMY WITH NEEDLE LOCALIZATION;  Surgeon: Imogene Burn. Darlene Dover, MD;  Location: [facility];  Service: General;  Laterality: Left;  . BREAST SURGERY    . COLONOSCOPY  01/2016   01/2016 with Dr. Loletha Carrow, tubular adenoma polpy; 2003 with Dr. Collene Mares  . ELBOW ARTHROPLASTY  2006   rt-fx  . FOOT ARTHROTOMY  11/12   foot fusion, right  . FOOT MASS EXCISION  3/14   left-fusion  . PARTIAL HYSTERECTOMY  age 48   uterine fibroids, still has ovaries  . TONSILLECTOMY     Social History   Socioeconomic History  . Marital status: Married    Spouse name: Not on file  . Number of children: Not on file  . Years of education: Not on file  . Highest education level: Not on file  Occupational History  . Not on file  Social Needs  .  Financial resource strain: Not on file  . Food insecurity:    Worry: Not on file    Inability: Not on file  . Transportation needs:    Medical: Not on file    Non-medical: Not on file  Tobacco Use  . Smoking status: Former Smoker    Packs/day: 1.00    Years: 20.00    Pack years: 20.00    Last attempt to quit: 04/11/2009    Years since quitting: 8.1  . Smokeless tobacco: Never Used  Substance and Sexual Activity  . Alcohol use: No    Alcohol/week: 0.6 oz    Types: 1 Glasses of wine per week  . Drug use: No  . Sexual activity: Not on file  Lifestyle  . Physical activity:    Days per week: Not on file    Minutes per session: Not on file  . Stress: Not on file  Relationships  . Social connections:    Talks on phone: Not on file    Gets together: Not on file    Attends religious service: Not on file    Active member of club or organization: Not on file    Attends meetings of clubs or organizations: Not on file    Relationship status: Not on file  Other Topics Concern  .  Not on file  Social History Narrative   Separated from husband (2013). Living with husband.   No significant other.  Walking for exercise 2-3 x per week.  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 Naproxen Nausea Only  . Vicodin [Hydrocodone-Acetaminophen] Nausea Only   Family History  Problem Relation Age of Onset  . Diabetes Mother   . Hypertension Mother   . Diabetes Maternal Aunt   . Heart disease Maternal Grandfather   . Cancer Neg Hx      Past medical history, social, surgical and family history all reviewed in electronic medical record.  No pertanent information unless stated regarding to the chief complaint.   Review of Systems:Review of systems updated and as accurate as of 05/26/17  No headache, visual changes, nausea, vomiting, diarrhea, constipation, dizziness, abdominal pain, skin rash, fevers,  chills, night sweats, weight loss, swollen lymph nodes, body aches,  chest pain, shortness of breath, mood changes.  Positive joint swelling and muscle aches  Objective  Blood pressure 118/78, pulse (!) 103, height 5\' 3"  (1.6 m), weight 236 lb (107 kg), SpO2 97 %. Systems examined below as of 05/26/17   General: No apparent distress alert and oriented x3 mood and affect normal, dressed appropriately.  HEENT: Pupils equal, extraocular movements intact  Respiratory: Patient's speak in full sentences and does not appear short of breath  Cardiovascular: No lower extremity edema, non tender, no erythema  Skin: Warm dry intact with no signs of infection or rash on extremities or on axial skeleton.  Abdomen: Soft nontender  Neuro: Cranial nerves II through XII are intact, neurovascularly intact in all extremities with 2+ DTRs and 2+ pulses.  Lymph: No lymphadenopathy of posterior or anterior cervical chain or axillae bilaterally.  Gait antalgic MSK:  Non tender with full range of motion and good stability and symmetric strength and tone of shoulders, elbows, wrist, hip, and ankles bilaterally.  Knee: Bilateral valgus deformity noted. Large thigh to calf ratio.  Tender to palpation over medial and PF joint line.  ROM full in flexion and extension and lower leg rotation. instability with valgus force.  painful patellar compression. Patellar glide with moderate crepitus. Patellar and quadriceps tendons unremarkable. Hamstring and quadriceps strength is normal.    Impression and Recommendations:     This case required medical decision making of moderate complexity.      Note: This dictation was prepared with Dragon dictation along with smaller phrase technology. Any transcriptional errors that result from this process are unintentional.

## 2017-05-26 ENCOUNTER — Ambulatory Visit: Payer: Medicare HMO | Admitting: Family Medicine

## 2017-05-26 VITALS — BP 118/78 | HR 103 | Ht 63.0 in | Wt 236.0 lb

## 2017-05-26 DIAGNOSIS — M170 Bilateral primary osteoarthritis of knee: Secondary | ICD-10-CM

## 2017-05-26 NOTE — Patient Instructions (Signed)
Good to see you  I think you will do great  Dr. Mayer Camel will get things right [facility] [address] [phone]  They should call you  If you have questions please call me

## 2017-05-26 NOTE — Assessment & Plan Note (Signed)
Spent  25 minutes with patient face-to-face and had greater than 50% of counseling including as described above in assessment and plan. Discussed icing regimen and home exercises.  Discussed which activities of doing which wants to avoid.  Patient will though be sent to orthopedic surgery to discuss replacement.  Patient has failed all conservative therapy including Visco supplementation at this time.  Patient is in agreement with the plan and will call us if she has any questions

## 2017-06-03 ENCOUNTER — Telehealth: Payer: Self-pay | Admitting: Medical

## 2017-06-03 NOTE — Telephone Encounter (Signed)
Copied from [place] [id number]. Topic: Quick Communication - See Telephone Encounter >> Jun 03, 2017  2:59 PM Rutherford Nail, NT wrote: CRM for notification. See Telephone encounter for: 06/03/17. Patient calling and states that Dr. Mayer Camel at [facility] is requesting x-rays from 11/11/16. Patient has an appointment on May 1,18 Please advise. CB#: [phone]

## 2017-06-03 NOTE — Telephone Encounter (Signed)
X-ray faxed to [facility].

## 2017-06-11 DIAGNOSIS — M1711 Unilateral primary osteoarthritis, right knee: Secondary | ICD-10-CM | POA: Diagnosis not present

## 2017-06-11 DIAGNOSIS — M1712 Unilateral primary osteoarthritis, left knee: Secondary | ICD-10-CM | POA: Diagnosis not present

## 2017-06-12 DIAGNOSIS — H524 Presbyopia: Secondary | ICD-10-CM | POA: Diagnosis not present

## 2017-06-12 LAB — HM DIABETES EYE EXAM

## 2017-06-16 ENCOUNTER — Encounter: Payer: Self-pay | Admitting: Medical

## 2017-06-19 ENCOUNTER — Telehealth: Payer: Self-pay

## 2017-06-19 ENCOUNTER — Telehealth: Payer: Self-pay | Admitting: Medical

## 2017-06-19 ENCOUNTER — Other Ambulatory Visit: Payer: Self-pay | Admitting: Medical

## 2017-06-19 MED ORDER — LOSARTAN POTASSIUM 50 MG PO TABS: 50.0000 mg | ORAL_TABLET | Freq: Every day | ORAL | 3 refills | Status: DC

## 2017-06-19 NOTE — Telephone Encounter (Signed)
Pt called and is wanting a 30 day supply to of her losartan until she gets her mail order comes in if you could please call her [phone] after it is called in

## 2017-06-19 NOTE — Telephone Encounter (Signed)
Refill med to updated pharmacy [facility]

## 2017-06-19 NOTE — Telephone Encounter (Signed)
Darlene Wade, a few things...  1 - She has had visit within 31mo, so this could be sent to nurse to refill without coming to me per our refill protocol 2 - second, I sent refills in 03/2017 for a year, so she is not due, so please either you or patient should contact pharmacy as refills are up to date in [place]

## 2017-06-19 NOTE — Telephone Encounter (Signed)
Received fax rx request for 90 day supply for losartan from [facility]

## 2017-06-20 ENCOUNTER — Other Ambulatory Visit: Payer: Self-pay

## 2017-06-20 MED ORDER — LOSARTAN POTASSIUM 50 MG PO TABS: 50.0000 mg | ORAL_TABLET | Freq: Every day | ORAL | 0 refills | Status: DC

## 2017-06-20 NOTE — Telephone Encounter (Signed)
Done

## 2017-09-22 ENCOUNTER — Other Ambulatory Visit: Payer: Self-pay | Admitting: Medical

## 2017-09-22 DIAGNOSIS — Z1231 Encounter for screening mammogram for malignant neoplasm of breast: Secondary | ICD-10-CM

## 2017-10-18 ENCOUNTER — Other Ambulatory Visit: Payer: Self-pay | Admitting: Medical

## 2017-10-23 ENCOUNTER — Ambulatory Visit
Admission: RE | Admit: 2017-10-23 | Discharge: 2017-10-23 | Disposition: A | Discharge status: Home / Self Care | Payer: Medicare HMO | Source: Ambulatory Visit | Attending: Medical | Admitting: Medical

## 2017-10-23 DIAGNOSIS — Z1231 Encounter for screening mammogram for malignant neoplasm of breast: Secondary | ICD-10-CM

## 2017-11-13 ENCOUNTER — Ambulatory Visit (INDEPENDENT_AMBULATORY_CARE_PROVIDER_SITE_OTHER): Payer: Medicare HMO | Admitting: Medical

## 2017-11-13 ENCOUNTER — Encounter: Payer: Self-pay | Admitting: Medical

## 2017-11-13 VITALS — BP 110/70 | HR 95 | Temp 97.7°F | Resp 16 | Ht 63.0 in | Wt 229.0 lb

## 2017-11-13 DIAGNOSIS — Z0000 Encounter for general adult medical examination without abnormal findings: Secondary | ICD-10-CM

## 2017-11-13 DIAGNOSIS — Z136 Encounter for screening for cardiovascular disorders: Secondary | ICD-10-CM

## 2017-11-13 DIAGNOSIS — E118 Type 2 diabetes mellitus with unspecified complications: Secondary | ICD-10-CM | POA: Diagnosis not present

## 2017-11-13 DIAGNOSIS — Z23 Encounter for immunization: Secondary | ICD-10-CM

## 2017-11-13 DIAGNOSIS — E559 Vitamin D deficiency, unspecified: Secondary | ICD-10-CM

## 2017-11-13 DIAGNOSIS — Z7185 Encounter for immunization safety counseling: Secondary | ICD-10-CM

## 2017-11-13 DIAGNOSIS — M19079 Primary osteoarthritis, unspecified ankle and foot: Secondary | ICD-10-CM | POA: Diagnosis not present

## 2017-11-13 DIAGNOSIS — E785 Hyperlipidemia, unspecified: Secondary | ICD-10-CM

## 2017-11-13 DIAGNOSIS — E2839 Other primary ovarian failure: Secondary | ICD-10-CM

## 2017-11-13 DIAGNOSIS — I10 Essential (primary) hypertension: Secondary | ICD-10-CM | POA: Diagnosis not present

## 2017-11-13 DIAGNOSIS — Z7189 Other specified counseling: Secondary | ICD-10-CM

## 2017-11-13 DIAGNOSIS — M170 Bilateral primary osteoarthritis of knee: Secondary | ICD-10-CM

## 2017-11-13 DIAGNOSIS — J4520 Mild intermittent asthma, uncomplicated: Secondary | ICD-10-CM

## 2017-11-13 NOTE — Patient Instructions (Signed)
Thanks for trusting [place] with your health care and for coming in for a physical today.  Below are some general recommendations I have for you:  Yearly screenings See your eye doctor yearly for routine vision care. See your dentist yearly for routine dental care including hygiene visits twice yearly. See me here yearly for a routine physical and preventative care visit   Specific Concerns today:  . Consider advanced directives - living will and health care power of attorney . We will schedule an echocardiogram . continue current medications . Consider joining weight watchers program    Please follow up yearly for a physical.    I have included other useful information below for your review.  Preventative Care for Adults - Female      MAINTAIN REGULAR HEALTH EXAMS:  A routine yearly physical is a good way to check in with your primary care provider about your health and preventive screening. It is also an opportunity to share updates about your health and any concerns you have, and receive a thorough all-over exam.   Most health insurance companies pay for at least some preventative services.  Check with your health plan for specific coverages.  WHAT PREVENTATIVE SERVICES DO WOMEN NEED?  Adult women should have their weight and blood pressure checked regularly.   Women age 72 and older should have their cholesterol levels checked regularly.  Women should be screened for cervical cancer with a Pap smear and pelvic exam beginning at either age 6, or 3 years after they become sexually activity.    Breast cancer screening generally begins at age 21 with a mammogram and breast exam by your primary care provider.    Beginning at age 68 and continuing to age 46, women should be screened for colorectal cancer.  Certain people may need continued testing until age 70.  Updating vaccinations is part of preventative care.  Vaccinations help protect against diseases such as the  flu.  Osteoporosis is a disease in which the bones lose minerals and strength as we age. Women ages 34 and over should discuss this with their caregivers, as should women after menopause who have other risk factors.  Lab tests are generally done as part of preventative care to screen for anemia and blood disorders, to screen for problems with the kidneys and liver, to screen for bladder problems, to check blood sugar, and to check your cholesterol level.  Preventative services generally include counseling about diet, exercise, avoiding tobacco, drugs, excessive alcohol consumption, and sexually transmitted infections.    GENERAL RECOMMENDATIONS FOR GOOD HEALTH:  Healthy diet:  Eat a variety of foods, including fruit, vegetables, animal or vegetable protein, such as meat, fish, chicken, and eggs, or beans, lentils, tofu, and grains, such as rice.  Drink plenty of water daily.  Decrease saturated fat in the diet, avoid lots of red meat, processed foods, sweets, fast foods, and fried foods.  Exercise:  Aerobic exercise helps maintain good heart health. At least 30-40 minutes of moderate-intensity exercise is recommended. For example, a brisk walk that increases your heart rate and breathing. This should be done on most days of the week.   Find a type of exercise or a variety of exercises that you enjoy so that it becomes a part of your daily life.  Examples are running, walking, swimming, water aerobics, and biking.  For motivation and support, explore group exercise such as aerobic class, spin class, Zumba, Yoga,or  martial arts, etc.    Set exercise  goals for yourself, such as a certain weight goal, walk or run in a race such as a 5k walk/run.  Speak to your primary care provider about exercise goals.  Disease prevention:  If you smoke or chew tobacco, find out from your caregiver how to quit. It can literally save your life, no matter how long you have been a tobacco user. If you do not  use tobacco, never begin.   Maintain a healthy diet and normal weight. Increased weight leads to problems with blood pressure and diabetes.   The Body Mass Index or BMI is a way of measuring how much of your body is fat. Having a BMI above 27 increases the risk of heart disease, diabetes, hypertension, stroke and other problems related to obesity. Your caregiver can help determine your BMI and based on it develop an exercise and dietary program to help you achieve or maintain this important measurement at a healthful level.  High blood pressure causes heart and blood vessel problems.  Persistent high blood pressure should be treated with medicine if weight loss and exercise do not work.   Fat and cholesterol leaves deposits in your arteries that can block them. This causes heart disease and vessel disease elsewhere in your body.  If your cholesterol is found to be high, or if you have heart disease or certain other medical conditions, then you may need to have your cholesterol monitored frequently and be treated with medication.   Ask if you should have a cardiac stress test if your history suggests this. A stress test is a test done on a treadmill that looks for heart disease. This test can find disease prior to there being a problem.  Menopause can be associated with physical symptoms and risks. Hormone replacement therapy is available to decrease these. You should talk to your caregiver about whether starting or continuing to take hormones is right for you.   Osteoporosis is a disease in which the bones lose minerals and strength as we age. This can result in serious bone fractures. Risk of osteoporosis can be identified using a bone density scan. Women ages 30 and over should discuss this with their caregivers, as should women after menopause who have other risk factors. Ask your caregiver whether you should be taking a calcium supplement and Vitamin D, to reduce the rate of osteoporosis.   Avoid  drinking alcohol in excess (more than two drinks per day).  Avoid use of street drugs. Do not share needles with anyone. Ask for professional help if you need assistance or instructions on stopping the use of alcohol, cigarettes, and/or drugs.  Brush your teeth twice a day with fluoride toothpaste, and floss once a day. Good oral hygiene prevents tooth decay and gum disease. The problems can be painful, unattractive, and can cause other health problems. Visit your dentist for a routine oral and dental check up and preventive care every 6-12 months.   Look at your skin regularly.  Use a mirror to look at your back. Notify your caregivers of changes in moles, especially if there are changes in shapes, colors, a size larger than a pencil eraser, an irregular border, or development of new moles.  Safety:  Use seatbelts 100% of the time, whether driving or as a passenger.  Use safety devices such as hearing protection if you work in environments with loud noise or significant background noise.  Use safety glasses when doing any work that could send debris in to the eyes.  Use a helmet if you ride a bike or motorcycle.  Use appropriate safety gear for contact sports.  Talk to your caregiver about gun safety.  Use sunscreen with a SPF (or skin protection factor) of 15 or greater.  Lighter skinned people are at a greater risk of skin cancer. Don't forget to also wear sunglasses in order to protect your eyes from too much damaging sunlight. Damaging sunlight can accelerate cataract formation.   Practice safe sex. Use condoms. Condoms are used for birth control and to help reduce the spread of sexually transmitted infections (or STIs).  Some of the STIs are gonorrhea (the clap), chlamydia, syphilis, trichomonas, herpes, HPV (human papilloma virus) and HIV (human immunodeficiency virus) which causes AIDS. The herpes, HIV and HPV are viral illnesses that have no cure. These can result in disability, cancer and  death.   Keep carbon monoxide and smoke detectors in your home functioning at all times. Change the batteries every 6 months or use a model that plugs into the wall.   Vaccinations:  Stay up to date with your tetanus shots and other required immunizations. You should have a booster for tetanus every 10 years. Be sure to get your flu shot every year, since 5%-20% of the [place] population comes down with the flu. The flu vaccine changes each year, so being vaccinated once is not enough. Get your shot in the fall, before the flu season peaks.   Other vaccines to consider:  Human Papilloma Virus or HPV causes cancer of the cervix, and other infections that can be transmitted from person to person. There is a vaccine for HPV, and females should get immunized between the ages of 7 and 59. It requires a series of 3 shots.   Pneumococcal vaccine to protect against certain types of pneumonia.  This is normally recommended for adults age 6 or older.  However, adults younger than 64 years old with certain underlying conditions such as diabetes, heart or lung disease should also receive the vaccine.  Shingles vaccine to protect against Varicella Zoster if you are older than age 47, or younger than 64 years old with certain underlying illness.  If you have not had the Shingrix vaccine, please call your insurer to inquire about coverage for the Shingrix vaccine given in 2 doses.   Some insurers cover this vaccine after age 15, some cover this after age 67.  If your insurer covers this, then call to schedule appointment to have this vaccine here  Hepatitis A vaccine to protect against a form of infection of the liver by a virus acquired from food.  Hepatitis B vaccine to protect against a form of infection of the liver by a virus acquired from blood or body fluids, particularly if you work in health care.  If you plan to travel internationally, check with your local health department for specific vaccination  recommendations.  Cancer Screening:  Breast cancer screening is essential to preventive care for women. All women age 98 and older should perform a breast self-exam every month. At age 12 and older, women should have their caregiver complete a breast exam each year. Women at ages 86 and older should have a mammogram (x-ray film) of the breasts. Your caregiver can discuss how often you need mammograms.    Cervical cancer screening includes taking a Pap smear (sample of cells examined under a microscope) from the cervix (end of the uterus). It also includes testing for HPV (Human Papilloma Virus, which can cause cervical  cancer). Screening and a pelvic exam should begin at age 95, or 3 years after a woman becomes sexually active. Screening should occur every year, with a Pap smear but no HPV testing, up to age 73. After age 6, you should have a Pap smear every 3 years with HPV testing, if no HPV was found previously.   Most routine colon cancer screening begins at the age of 54. On a yearly basis, doctors may provide special easy to use take-home tests to check for hidden blood in the stool. Sigmoidoscopy or colonoscopy can detect the earliest forms of colon cancer and is life saving. These tests use a small camera at the end of a tube to directly examine the colon. Speak to your caregiver about this at age 104, when routine screening begins (and is repeated every 5 years unless early forms of pre-cancerous polyps or small growths are found).

## 2017-11-13 NOTE — Progress Notes (Signed)
Subjective:    Darlene Wade is a 62 y.o. female who presents for Preventative Services visit and chronic medical problems/med check visit.    Primary Care Provider Tysinger, Camelia Eng, PA-C here for primary care  Current Health Care Team:  Dentist  Eye doctor, Dr. Ronnald Ramp   Dr. Mayer Camel, orthopedics, also Dr. Tamala Julian prior orthopedics for injections  Dr. Wilfrid Lund, [place] you may have received from other than [facility] providers in the past year (date may be approximate) orthopedics  Exercise Current exercise habits: walking 30 min a day   Nutrition/Diet Current diet: Regular   Depression Screen Depression screen [facility] 2/9 11/13/2017  Decreased Interest 0  Down, Depressed, Hopeless 0  PHQ - 2 Score 0  Altered sleeping -  Tired, decreased energy -  Change in appetite -  Feeling bad or failure about yourself  -  Trouble concentrating -  Moving slowly or fidgety/restless -  Suicidal thoughts -  PHQ-9 Score -    Activities of Daily Living Screen/Functional Status Survey Is the patient deaf or have difficulty hearing?: No Does the patient have difficulty seeing, even when wearing glasses/contacts?: No Does the patient have difficulty concentrating, remembering, or making decisions?: No Does the patient have difficulty walking or climbing stairs?: Yes Does the patient have difficulty dressing or bathing?: No Does the patient have difficulty doing errands alone such as visiting a doctor's office or shopping?: No  Can patient draw a clock face showing 3:15 o'clock, yes  Fall Risk Screen Fall Risk  11/13/2017 05/22/2016  Falls in the past year? No No    Gait Assessment: Gait with pain due to knee arthritis  Advanced directives Does patient have a [place]? No Does patient have a Living Will? No  Past Medical History:  Diagnosis Date  . Anemia   . Asthma   . Diabetes mellitus without complication ([place]) 9169  . Diabetic eye exam [facility])    [employer]  . Elbow fracture, right 2007  . Former smoker    20 pack year history, quit 2010  . Hyperlipidemia   . Hypertension   . Insomnia   . Obesity   . PONV (postoperative nausea and vomiting) 2014   1 time  . Wears glasses    reading    Past Surgical History:  Procedure Laterality Date  . BREAST CYST ASPIRATION  2015  . BREAST EXCISIONAL BIOPSY Left   . BREAST LUMPECTOMY WITH NEEDLE LOCALIZATION Left 10/07/2012   Procedure: BREAST LUMPECTOMY WITH NEEDLE LOCALIZATION;  Surgeon: Imogene Burn. Georgette Dover, MD;  Location: [place];  Service: General;  Laterality: Left;  . BREAST SURGERY    . COLONOSCOPY  01/2016   01/2016 with Dr. Loletha Carrow, tubular adenoma polpy; 2003 with Dr. Collene Mares  . ELBOW ARTHROPLASTY  2006   rt-fx  . FOOT ARTHROTOMY  11/12   foot fusion, right  . FOOT MASS EXCISION  3/14   left-fusion  . PARTIAL HYSTERECTOMY  age 74   uterine fibroids, still has ovaries  . TONSILLECTOMY      Social History   Socioeconomic History  . Marital status: Married    Spouse name: Not on file  . Number of children: Not on file  . Years of education: Not on file  . Highest education level: Not on file  Occupational History  . Not on file  Social Needs  . Financial resource strain: Not on file  . Food insecurity:    Worry:  Not on file    Inability: Not on file  . Transportation needs:    Medical: Not on file    Non-medical: Not on file  Tobacco Use  . Smoking status: Former Smoker    Packs/day: 1.00    Years: 20.00    Pack years: 20.00    Last attempt to quit: 04/11/2009    Years since quitting: 8.5  . Smokeless tobacco: Never Used  Substance and Sexual Activity  . Alcohol use: No    Alcohol/week: 1.0 standard drinks    Types: 1 Glasses of wine per week  . Drug use: No  . Sexual activity: Not on file  Lifestyle  . Physical activity:    Days per week: Not on file    Minutes per session: Not on file  . Stress: Not on file  Relationships  . Social  connections:    Talks on phone: Not on file    Gets together: Not on file    Attends religious service: Not on file    Active member of club or organization: Not on file    Attends meetings of clubs or organizations: Not on file    Relationship status: Not on file  . Intimate partner violence:    Fear of current or ex partner: Not on file    Emotionally abused: Not on file    Physically abused: Not on file    Forced sexual activity: Not on file  Other Topics Concern  . Not on file  Social History Narrative   Separated from husband (2013). Living with husband.   No significant other.  Walking for exercise 2-3 x per week.  Retired, Set designer in the past.  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 Diabetes Mother   . Hypertension Mother   . Diabetes Maternal Aunt   . Heart disease Maternal Grandfather   . Cancer Neg Hx      Current Outpatient Medications:  .  amLODipine (NORVASC) 5 MG tablet, TAKE 1 TABLET EVERY DAY, Disp: 90 tablet, Rfl: 0 .  aspirin EC 81 MG tablet, Take 1 tablet (81 mg total) by mouth daily., Disp: 90 tablet, Rfl: 3 .  atorvastatin (LIPITOR) 40 MG tablet, Take 1 tablet (40 mg total) by mouth daily., Disp: 90 tablet, Rfl: 3 .  Blood Glucose Monitoring Suppl (ACCU-CHEK AVIVA PLUS) w/Device KIT, Test blood sugar 2 times daily, Disp: 1 kit, Rfl: 0 .  calcium-vitamin D 250-100 MG-UNIT per tablet, Take 1 tablet by mouth 2 (two) times daily. Reported on 07/27/2015, Disp: , Rfl:  .  co-enzyme Q-10 30 MG capsule, Take 30 mg by mouth 3 (three) times daily. Reported on 07/27/2015, Disp: , Rfl:  .  glucose blood test strip, Patient is to test BID. Needs accu-chek softclik test strips, Disp: 200 each, Rfl: 4 .  Lancet Devices (ACCU-CHEK SOFTCLIX) lancets, Test sugar twice daily, Disp: 200 each, Rfl: 4 .  levocetirizine (XYZAL) 5 MG tablet, Take 1 tablet (5 mg total) by mouth every evening., Disp: 30  tablet, Rfl: 2 .  losartan (COZAAR) 50 MG tablet, Take 1 tablet (50 mg total) by mouth daily., Disp: 30 tablet, Rfl: 0 .  metFORMIN (GLUCOPHAGE) 1000 MG tablet, Take 1 tablet (1,000 mg total) by mouth 2 (two) times daily with a meal., Disp: 180 tablet, Rfl: 3 .  Multiple Vitamin (MULTIVITAMIN) tablet, Take 1 tablet by mouth  daily., Disp: , Rfl:  .  Vitamin D, Ergocalciferol, (DRISDOL) 50000 units CAPS capsule, Take 1 capsule (50,000 Units total) by mouth every 7 (seven) days., Disp: 12 capsule, Rfl: 3 .  vitamin E 400 UNIT capsule, Take 400 Units by mouth daily., Disp: , Rfl:  .  fluticasone furoate-vilanterol (BREO ELLIPTA) 200-25 MCG/INH AEPB, Inhale 1 puff into the lungs daily. (Patient not taking: Reported on 11/13/2017), Disp: 28 each, Rfl: 1 .  Fluticasone-Salmeterol (ADVAIR DISKUS) 250-50 MCG/DOSE AEPB, Inhale 1 puff into the lungs 2 (two) times daily. (Patient not taking: Reported on 11/13/2017), Disp: 1 each, Rfl: 2  Current Facility-Administered Medications:  .  0.9 %  sodium chloride infusion, 500 mL, Intravenous, Continuous, Danis, Kirke Corin, MD  Allergies  Allergen Reactions  . Naproxen Nausea Only  . Vicodin [Hydrocodone-Acetaminophen] Nausea Only    History reviewed: allergies, current medications, past family history, past medical history, past social history, past surgical history and problem list  Chronic issues discussed: Obesity - exercising but limited by knee pain  Knee arthritis bilaterally, has had consult with orthopedics recent with Dr. Mayer Camel.  She will need to lose more weight but they are considering total knee replacement.  She has prior had injections with Dr. Tamala Julian including steroid and platelet rich plasma she thinks.  Neither seem to help that much according to her.  She was considering calling [facility] for consult  Diabetes- needs a new glucometer as hers got misplaced.  Compliant with metformin 1000 mg twice daily  Hypertension-compliant with losartan  50 mg daily, amlodipine 5 mg daily  Hyperlipidemia-compliant with Lipitor 40 mg daily, aspirin 81 mg daily  Vitamin D deficiency-compliant with vitamin D 2000 units weekly  History of asthma, no recent problems  Allergic rhinitis-compliant with allergy medicine daily   Acute issues discussed: none  Objective:     BP 110/70   Pulse 95   Temp 97.7 F (36.5 C) (Oral)   Resp 16   Ht '5\' 3"'  (1.6 m)   Wt 229 lb (103.9 kg)   SpO2 97%   BMI 40.57 kg/m   BP Readings from Last 3 Encounters:  11/13/17 110/70  05/26/17 118/78  05/13/17 128/82   Wt Readings from Last 3 Encounters:  11/13/17 229 lb (103.9 kg)  05/26/17 236 lb (107 kg)  05/13/17 238 lb 6.4 oz (108.1 kg)    Other pertinent exam: Gen: wd, wn, nad, obese AA female Skin: Left distal upper leg just proximal to the knee laterally with brownish slightly raised stuck on appearing 1 cm x 1 cm squarish lesion, similar larger lesion of left lower leg proximal third similar to the other lesion but approximately 1.5 cm x 2 cm squarish, other scattered benign-appearing macules HEENT: normocephalic, sclerae anicteric, TMs pearly, nares patent, no discharge or erythema, pharynx normal Oral cavity: MMM, no lesions Neck: supple, no lymphadenopathy, no thyromegaly, no masses Heart: RRR, normal S1, S2, no murmurs Lungs: CTA bilaterally, no wheezes, rhonchi, or rales Abdomen: +bs, soft, non tender, non distended, no masses, no hepatomegaly, no splenomegaly Musculoskeletal: tender bilat knees, lying to avoid knee hanging due to pain, right dorsal great toe surgical scar, left great toe surgical scar, nontender, no swelling, no obvious deformity Extremities: no edema, no cyanosis, no clubbing Pulses: 2+ symmetric, upper and lower extremities, normal cap refill Neurological: alert, oriented x 3, CN2-12 intact, strength normal upper extremities and lower extremities, sensation normal throughout, DTRs 2+ throughout, no cerebellar signs,  gait normal Psychiatric: normal affect, behavior normal,  pleasant  Breast/gyn/rectal - deferred at her request today    Adult ECG Report  Indication: HTN, diabetes, screen for diabetes  Rate: 72 bpm  Rhythm: normal sinus rhythm  QRS Axis: -2 degrees  PR Interval: 190m  QRS Duration: 836m QTc: 43348mConduction Disturbances: none  Other Abnormalities: none  Patient's cardiac risk factors are: diabetes mellitus, dyslipidemia, hypertension and obesity (BMI >= 30 kg/m2).  EKG comparison: compared to 2016 EKG  Narrative Interpretation: no acute changes     Assessment:   Encounter Diagnoses  Name Primary?  . Essential hypertension, benign Yes  . Medicare annual wellness visit, subsequent   . Need for influenza vaccination   . Diabetes mellitus with complication ([place] . Primary osteoarthritis of both knees   . Osteoarthritis of foot, unspecified laterality, unspecified osteoarthritis type   . Estrogen deficiency   . Hyperlipidemia, unspecified hyperlipidemia type   . Vaccine counseling   . Vitamin D deficiency   . Morbid obesity ([place] . Screening for heart disease   . Mild intermittent asthma without complication      Plan:   A preventative services visit was completed today.  During the course of the visit today, we discussed and counseled about appropriate screening and preventive services.  A health risk assessment was established today that included a review of current medications, allergies, social history, family history, medical and preventative health history, biometrics, and preventative screenings to identify potential safety concerns or impairments.  A personalized plan was printed today for your records and use.   Personalized health advice and education was given today to reduce health risks and promote self management and wellness.  Information regarding end of life planning was discussed today.  Vaccines Shingles vaccine:  I recommend you have a shingles  vaccine to help prevent shingles or herpes zoster outbreak.   Please call your insurer to inquire about coverage for the Shingrix vaccine given in 2 doses.   Some insurers cover this vaccine after age 79,45ome cover this after age 36.4If your insurer covers this, then call to schedule appointment to have this vaccine here.  Counseled on the influenza virus vaccine.  Vaccine information sheet given.  Influenza vaccine given after consent obtained.  She is up-to-date on tetanus vaccine and pneumonia 23 vaccine   Cancer screens Up-to-date on mammogram which was reviewed  Status post hysterectomy and pelvic exam done last year  Up-to-date on colonoscopy   Other screenings Advise she see eye doctor and dentist yearly  Bone density scan last year was normal   Chronic issues discussed Osteoarthritis of knees-advised she consult with Dr. RowMayer Camelfore making decision on [employer] consult  Hypertension-continue current medication  Hyperlipidemia-continue current medication  Asthma no recent issues  Diabetes-prescription for new glucometer-continue current medication, labs today continue daily foot checks yearly eye doctor visit  Obesity-advised weight watchers or other organized program  Given her risk factors will refer for echocardiogram   Recommendations:  I recommend a yearly ophthalmology/optometry visit for glaucoma screening and eye checkup  I recommended a yearly dental visit for hygiene and checkup  Advanced directives - discussed nature and purpose of Advanced Directives, encouraged them to complete them if they have not done so and/or encouraged them to get us [facility] if they have done this already.  DebSvaras seen today for mwv.  Diagnoses and all orders for this visit:  Essential hypertension, benign -     EKG 12-Lead -  ECHOCARDIOGRAM COMPLETE; Future  Medicare annual wellness visit, subsequent  Need for influenza vaccination -     Flu Vaccine QUAD 6+  mos PF IM (Fluarix Quad PF)  Diabetes mellitus with complication ([place]) -     Hemoglobin A1c -     HM DIABETES EYE EXAM -     HM DIABETES FOOT EXAM -     ECHOCARDIOGRAM COMPLETE; Future  Primary osteoarthritis of both knees  Osteoarthritis of foot, unspecified laterality, unspecified osteoarthritis type  Estrogen deficiency  Hyperlipidemia, unspecified hyperlipidemia type -     ECHOCARDIOGRAM COMPLETE; Future  Vaccine counseling  Vitamin D deficiency  Morbid obesity ([place])  Screening for heart disease -     EKG 12-Lead -     ECHOCARDIOGRAM COMPLETE; Future  Mild intermittent asthma without complication     Medicare Attestation A preventative services visit was completed today.  During the course of the visit the patient was educated and counseled about appropriate screening and preventive services.  A health risk assessment was established with the patient that included a review of current medications, allergies, social history, family history, medical and preventative health history, biometrics, and preventative screenings to identify potential safety concerns or impairments.  A personalized plan was printed today for the patient's records and use.   Personalized health advice and education was given today to reduce health risks and promote self management and wellness.  Information regarding end of life planning was discussed today.  Dorothea Ogle, PA-C   11/13/2017

## 2017-11-14 ENCOUNTER — Other Ambulatory Visit: Payer: Self-pay | Admitting: Medical

## 2017-11-14 LAB — HEMOGLOBIN A1C
Est. average glucose Bld gHb Est-mCnc: 143 mg/dL
Hgb A1c MFr Bld: 6.6 % — ABNORMAL HIGH (ref 4.8–5.6)

## 2017-11-14 MED ORDER — VITAMIN D 1000 UNITS PO TABS: 1000.0000 [IU] | ORAL_TABLET | Freq: Every day | ORAL | 3 refills | Status: DC

## 2017-11-14 MED ORDER — AMLODIPINE BESYLATE 5 MG PO TABS: 5.0000 mg | ORAL_TABLET | Freq: Every day | ORAL | 3 refills | Status: DC

## 2017-11-14 MED ORDER — ATORVASTATIN CALCIUM 40 MG PO TABS: 40.0000 mg | ORAL_TABLET | Freq: Every day | ORAL | 3 refills | Status: DC

## 2017-11-14 MED ORDER — LOSARTAN POTASSIUM 50 MG PO TABS: 50.0000 mg | ORAL_TABLET | Freq: Every day | ORAL | 3 refills | Status: DC

## 2017-11-17 ENCOUNTER — Other Ambulatory Visit: Payer: Self-pay | Admitting: Medical

## 2017-11-17 DIAGNOSIS — M25561 Pain in right knee: Secondary | ICD-10-CM | POA: Diagnosis not present

## 2017-11-17 DIAGNOSIS — M170 Bilateral primary osteoarthritis of knee: Secondary | ICD-10-CM | POA: Diagnosis not present

## 2017-11-17 DIAGNOSIS — M25562 Pain in left knee: Secondary | ICD-10-CM | POA: Diagnosis not present

## 2017-11-17 MED ORDER — ERTUGLIFLOZIN L-PYROGLUTAMICAC 5 MG PO TABS: 1.0000 | ORAL_TABLET | Freq: Every day | ORAL | 1 refills | Status: DC

## 2017-11-21 ENCOUNTER — Telehealth: Payer: Self-pay | Admitting: Medical

## 2017-11-21 NOTE — Telephone Encounter (Signed)
Initiated P.A. STEGLATRO  Darlene Wade per 03/26/17 note after a week on Jardiance she was having headaches, shortness of breath, nausea.  She stopped [place]

## 2017-11-24 ENCOUNTER — Other Ambulatory Visit: Payer: Self-pay | Admitting: Medical

## 2017-11-24 MED ORDER — EMPAGLIFLOZIN 10 MG PO TABS: 10.0000 mg | ORAL_TABLET | Freq: Every day | ORAL | 1 refills | Status: DC

## 2017-11-26 ENCOUNTER — Other Ambulatory Visit: Payer: Self-pay | Admitting: Medical

## 2017-11-26 MED ORDER — CANAGLIFLOZIN 100 MG PO TABS: 100.0000 mg | ORAL_TABLET | Freq: Every day | ORAL | 1 refills | Status: DC

## 2017-11-26 NOTE — Telephone Encounter (Signed)
P.A. Isabelle Course, in looking thru plans covered alternatives the only thing pt hasn't tried is Invokana.  Can pt be switched to Invokana? And if not need reasoning for appeal

## 2017-11-26 NOTE — Telephone Encounter (Signed)
I sent medication Invokana, as an alternative if insurance will not cover the others.  However, the drug representatives for [facility] has said repeatedly that anybody on private insurance should be able to get Steglatro without any problem.  List hold them accountable to this

## 2017-11-27 NOTE — Telephone Encounter (Signed)
Called pharmacy and Invokana went thru for $45, no discount card can be used due to pt has [employer] ins.  I cancelled the [facility] Rx.  Called pt and informed.

## 2017-11-28 ENCOUNTER — Other Ambulatory Visit (HOSPITAL_COMMUNITY): Payer: Medicare HMO

## 2017-12-03 ENCOUNTER — Telehealth: Payer: Self-pay

## 2017-12-03 NOTE — Telephone Encounter (Signed)
Patient called and left message on my voicemail wanting to know what medication she is suppose to be taking.   I looked at her lab results and told her that she is suppose to be on Invokana.

## 2017-12-08 ENCOUNTER — Ambulatory Visit (HOSPITAL_COMMUNITY)
Admission: RE | Admit: 2017-12-08 | Discharge: 2017-12-08 | Disposition: A | Discharge status: Home / Self Care | Payer: Medicare HMO | Source: Ambulatory Visit | Attending: Medical | Admitting: Medical

## 2017-12-08 DIAGNOSIS — Z136 Encounter for screening for cardiovascular disorders: Secondary | ICD-10-CM | POA: Diagnosis not present

## 2017-12-08 DIAGNOSIS — E785 Hyperlipidemia, unspecified: Secondary | ICD-10-CM | POA: Insufficient documentation

## 2017-12-08 DIAGNOSIS — E118 Type 2 diabetes mellitus with unspecified complications: Secondary | ICD-10-CM | POA: Diagnosis not present

## 2017-12-08 DIAGNOSIS — I10 Essential (primary) hypertension: Secondary | ICD-10-CM | POA: Diagnosis not present

## 2017-12-08 NOTE — Progress Notes (Signed)
  Echocardiogram 2D Echocardiogram has been performed.  Darlene Wade 12/08/2017, 9:42 AM

## 2017-12-09 ENCOUNTER — Other Ambulatory Visit: Payer: Self-pay

## 2017-12-09 DIAGNOSIS — R931 Abnormal findings on diagnostic imaging of heart and coronary circulation: Secondary | ICD-10-CM

## 2017-12-09 DIAGNOSIS — I10 Essential (primary) hypertension: Secondary | ICD-10-CM

## 2017-12-22 ENCOUNTER — Encounter: Payer: Self-pay | Admitting: Cardiovascular Disease

## 2017-12-26 ENCOUNTER — Telehealth: Payer: Self-pay | Admitting: Medical

## 2017-12-26 NOTE — Telephone Encounter (Signed)
I received a note from her insurer about not being able to afford some of her medications.  Please inquire and call and see which medicines she is having problems with?   Also keep in mind that generic medications sometimes can be even cheaper at places like [facility] or mail order pharmacies for 90-day supply.  Also some medications may have cheaper alternatives  We will find out what she is having trouble with?

## 2017-12-26 NOTE — Telephone Encounter (Signed)
Patient states that we have already taken care of her and the medication that she couldn't afford.   She states that she told the insurance co that we have already changed the medication to invokana.

## 2018-01-12 ENCOUNTER — Encounter: Payer: Self-pay | Admitting: Cardiovascular Disease

## 2018-01-12 ENCOUNTER — Ambulatory Visit: Payer: Medicare HMO | Admitting: Cardiovascular Disease

## 2018-01-12 VITALS — BP 100/62 | HR 84 | Ht 63.0 in | Wt 220.0 lb

## 2018-01-12 DIAGNOSIS — I5042 Chronic combined systolic (congestive) and diastolic (congestive) heart failure: Secondary | ICD-10-CM

## 2018-01-12 MED ORDER — CARVEDILOL 3.125 MG PO TABS: 3.1250 mg | ORAL_TABLET | Freq: Two times a day (BID) | ORAL | 3 refills | Status: DC

## 2018-01-12 NOTE — Patient Instructions (Addendum)
Medication Instructions:  Your physician has recommended you make the following change in your medication:   STOP Amlodipine (Norvasc) START Carvedilol (Coreg) 3.125 mg twice daily  If you need a refill on your cardiac medications before your next appointment, please call your pharmacy.   Lab work: Your physician recommends that you return for lab work in: approximately 1 week before your Coronary CT   Testing/Procedures: Please arrive at the [facility] main entrance of [facility] at xx:xx AM (30-45 minutes prior to test start time)  [facility] [place],  [id number] [phone]  Proceed to the [facility] Radiology Department (First Floor).  Please follow these instructions carefully (unless otherwise directed):  Hold all erectile dysfunction medications at least 48 hours prior to test.  On the Night Before the Test: . Be sure to Drink plenty of water. . Do not consume any caffeinated/decaffeinated beverages or chocolate 12 hours prior to your test. . Do not take any antihistamines 12 hours prior to your test. . If you take Metformin do not take 24 hours prior to test. . If the patient has contrast allergy: ? Patient will need a prescription for Prednisone and very clear instructions (as follows): 1. Prednisone 50 mg - take 13 hours prior to test 2. Take another Prednisone 50 mg 7 hours prior to test 3. Take another Prednisone 50 mg 1 hour prior to test 4. Take Benadryl 50 mg 1 hour prior to test . Patient must complete all four doses of above prophylactic medications. . Patient will need a ride after test due to Benadryl.  On the Day of the Test: . Drink plenty of water. Do not drink any water within one hour of the test. . Do not eat any food 4 hours prior to the test. . You may take your regular medications prior to the test. Make certain that you take your Carvedilol (Coreg) the night before and morning of the test          After the Test: . Drink plenty of water. . After receiving IV contrast, you may experience a mild flushed feeling. This is normal. . On occasion, you may experience a mild rash up to 24 hours after the test. This is not dangerous. If this occurs, you can take Benadryl 25 mg and increase your fluid intake. . If you experience trouble breathing, this can be serious. If it is severe call 911 IMMEDIATELY. If it is mild, please call our office. . If you take any of these medications: Glipizide/Metformin, Avandament, Glucavance, please do not take 48 hours after completing test.   Follow-Up: At [facility], you and your health needs are our priority.  As part of our continuing mission to provide you with exceptional heart care, we have created designated Provider Care Teams.  These Care Teams include your primary Cardiologist (physician) and Advanced Practice Providers (APPs -  Physician Assistants and Nurse Practitioners) who all work together to provide you with the care you need, when you need it. You will need a follow up appointment in:  6 months.  Please call our office 2 months in advance to schedule this appointment.  You may see Dr. Acie Fredrickson or one of the following Advanced Practice Providers on your designated Care Team: Richardson Dopp, PA-C [place], [place] . Daune Perch, NP

## 2018-01-12 NOTE — Progress Notes (Signed)
Cardiology Office Note:    Date:  01/12/2018   ID:  Darlene Wade, DOB 05-06-1955, MRN [id number]  PCP:  Carlena Hurl, PA-C  Cardiologist:  New to [place]  Electrophysiologist:  None   Referring MD: Carlena Hurl, PA-C   Problem List 1. Diabetes 2. HTN 3. Hyperlipidemia  4.  Osteoarthritis   Chief Complaint  Patient presents with  . Shortness of Breath     Dec. 2, 2019:    ERNISHA SORN is a 62 y.o. female with a hx of hypertension, hyperlipidemia, Diabetes  Her primary ordered on echo  She was found to have a very mildly reduced left ventricular systolic function ejection fraction of 40 to 45%.  She has grade 1 diastolic dysfunction.  Was exercising regularly in the gym but her knees started causing her lots of pain  Now she is walking 30 minutes a day  Drives a school bus   She does not eat  Lots of salt Has lost 40 lbs over the past several months - has been paying attention to her diet  Is also on Invokana    Past Medical History:  Diagnosis Date  . Anemia   . Asthma   . Diabetes mellitus without complication ([place]) 5784  . Diabetic eye exam [facility])    [employer]  . Elbow fracture, right 2007  . Former smoker    20 pack year history, quit 2010  . Hyperlipidemia   . Hypertension   . Insomnia   . Obesity   . PONV (postoperative nausea and vomiting) 2014   1 time  . Wears glasses    reading    Past Surgical History:  Procedure Laterality Date  . BREAST CYST ASPIRATION  2015  . BREAST EXCISIONAL BIOPSY Left   . BREAST LUMPECTOMY WITH NEEDLE LOCALIZATION Left 10/07/2012   Procedure: BREAST LUMPECTOMY WITH NEEDLE LOCALIZATION;  Surgeon: Imogene Burn. Georgette Dover, MD;  Location: [facility];  Service: General;  Laterality: Left;  . BREAST SURGERY    . COLONOSCOPY  01/2016   01/2016 with Dr. Loletha Carrow, tubular adenoma polpy; 2003 with Dr. Collene Mares  . ELBOW ARTHROPLASTY  2006   rt-fx  . FOOT ARTHROTOMY  11/12   foot fusion, right  . FOOT MASS  EXCISION  3/14   left-fusion  . PARTIAL HYSTERECTOMY  age 1   uterine fibroids, still has ovaries  . TONSILLECTOMY      Current Medications: Current Meds  Medication Sig  . aspirin EC 81 MG tablet Take 1 tablet (81 mg total) by mouth daily.  Marland Kitchen atorvastatin (LIPITOR) 40 MG tablet Take 1 tablet (40 mg total) by mouth daily.  . Blood Glucose Monitoring Suppl (ACCU-CHEK AVIVA PLUS) w/Device KIT Test blood sugar 2 times daily  . calcium-vitamin D 250-100 MG-UNIT per tablet Take 1 tablet by mouth 2 (two) times daily. Reported on 07/27/2015  . canagliflozin (INVOKANA) 100 MG TABS tablet Take 1 tablet (100 mg total) by mouth daily before breakfast.  . cholecalciferol (VITAMIN D) 1000 units tablet Take 1 tablet (1,000 Units total) by mouth daily.  Marland Kitchen co-enzyme Q-10 30 MG capsule Take 30 mg by mouth 3 (three) times daily. Reported on 07/27/2015  . glucose blood test strip Patient is to test BID. Needs accu-chek softclik test strips  . Lancet Devices (ACCU-CHEK SOFTCLIX) lancets Test sugar twice daily  . losartan (COZAAR) 50 MG tablet Take 1 tablet (50 mg total) by mouth daily.  . metFORMIN (GLUCOPHAGE) 1000 MG tablet  Take 1 tablet (1,000 mg total) by mouth 2 (two) times daily with a meal.  . Multiple Vitamin (MULTIVITAMIN) tablet Take 1 tablet by mouth daily.  . vitamin E 400 UNIT capsule Take 400 Units by mouth daily.  . [DISCONTINUED] amLODipine (NORVASC) 5 MG tablet Take 1 tablet (5 mg total) by mouth daily.   Current Facility-Administered Medications for the 01/12/18 encounter (Office Visit) with Wilfredo Canterbury, Wonda Cheng, MD  Medication  . 0.9 %  sodium chloride infusion     Allergies:   Oxycodone; Naproxen; and Vicodin [hydrocodone-acetaminophen]   Social History   Socioeconomic History  . Marital status: Married    Spouse name: Not on file  . Number of children: Not on file  . Years of education: Not on file  . Highest education level: Not on file  Occupational History  . Not on file    Social Needs  . Financial resource strain: Not on file  . Food insecurity:    Worry: Not on file    Inability: Not on file  . Transportation needs:    Medical: Not on file    Non-medical: Not on file  Tobacco Use  . Smoking status: Former Smoker    Packs/day: 1.00    Years: 20.00    Pack years: 20.00    Last attempt to quit: 04/11/2009    Years since quitting: 8.7  . Smokeless tobacco: Never Used  Substance and Sexual Activity  . Alcohol use: No    Alcohol/week: 1.0 standard drinks    Types: 1 Glasses of wine per week  . Drug use: No  . Sexual activity: Not on file  Lifestyle  . Physical activity:    Days per week: Not on file    Minutes per session: Not on file  . Stress: Not on file  Relationships  . Social connections:    Talks on phone: Not on file    Gets together: Not on file    Attends religious service: Not on file    Active member of club or organization: Not on file    Attends meetings of clubs or organizations: Not on file    Relationship status: Not on file  Other Topics Concern  . Not on file  Social History Narrative   Separated from husband (2013). Living with husband.   No significant other.  Walking for exercise 2-3 x per week.  Retired, Set designer in the past.  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  Driving school bus.   11/2017     Family History: The patient's family history includes Diabetes in her maternal aunt and mother; Heart disease in her maternal grandfather; Hypertension in her mother. There is no history of Cancer.  ROS:   Please see the history of present illness.     All other systems reviewed and are negative.  EKGs/Labs/Other Studies Reviewed:    The following studies were reviewed today:   EKG:    Recent Labs: 03/26/2017: ALT 10; BUN 9; Creatinine, Ser 0.78; Hemoglobin 11.9; Platelets 319; Potassium 4.1; Sodium 142  Recent Lipid Panel    Component Value Date/Time   CHOL 144 03/26/2017 1101   TRIG 83 03/26/2017 1101   HDL  55 03/26/2017 1101   CHOLHDL 2.6 03/26/2017 1101   CHOLHDL 2.0 05/22/2016 1014   VLDL 10 05/22/2016 1014   LDLCALC 72 03/26/2017 1101    Physical Exam:    VS:  BP 100/62   Pulse 84   Ht '5\' 3"'$  (1.6 m)  Wt 220 lb (99.8 kg)   SpO2 96%   BMI 38.97 kg/m     Wt Readings from Last 3 Encounters:  01/12/18 220 lb (99.8 kg)  11/13/17 229 lb (103.9 kg)  05/26/17 236 lb (107 kg)     GEN:  Well nourished, well developed in no acute distress HEENT: Normal NECK: No JVD; No carotid bruits LYMPHATICS: No lymphadenopathy CARDIAC: RRR, no murmurs, rubs, gallops RESPIRATORY:  Clear to auscultation without rales, wheezing or rhonchi  ABDOMEN: Soft, non-tender, non-distended MUSCULOSKELETAL:  No edema; No deformity  SKIN: Warm and dry NEUROLOGIC:  Alert and oriented x 3 PSYCHIATRIC:  Normal affect   ASSESSMENT:    1. Chronic combined systolic and diastolic CHF (congestive heart failure) (HCC)    PLAN:    In order of problems listed above:  1.  Mild chronic combined systolic and diastolic congestive heart failure:   She was incidentally found to have combined systolic and diastolic congestive heart failure on echocardiogram.  Her ejection fraction is 40 to 45%.  She is on losartan.  She is also on amlodipine.  We will discontinue the amlodipine and start her on carvedilol 3.125 mg twice a day. Evaluate her for the possibility of coronary ischemia, will order a coronary CT angiogram. Committed to getting back to the gym and working out.  She is working on diet, exercise, weight loss program. Is not having any episodes of angina.  She does note that her shortness of breath seems to be better.      Medication Adjustments/Labs and Tests Ordered: Current medicines are reviewed at length with the patient today.  Concerns regarding medicines are outlined above.  Orders Placed This Encounter  Procedures  . CT CORONARY MORPH W/CTA COR W/SCORE W/CA W/CM &/OR WO/CM  . CT CORONARY FRACTIONAL  FLOW RESERVE DATA PREP  . CT CORONARY FRACTIONAL FLOW RESERVE FLUID ANALYSIS  . Basic Metabolic Panel (BMET)   Meds ordered this encounter  Medications  . carvedilol (COREG) 3.125 MG tablet    Sig: Take 1 tablet (3.125 mg total) by mouth 2 (two) times daily.    Dispense:  180 tablet    Refill:  3     Patient Instructions  Medication Instructions:  Your physician has recommended you make the following change in your medication:   STOP Amlodipine (Norvasc) START Carvedilol (Coreg) 3.125 mg twice daily  If you need a refill on your cardiac medications before your next appointment, please call your pharmacy.   Lab work: Your physician recommends that you return for lab work in: approximately 1 week before your Coronary CT   Testing/Procedures: Please arrive at the [facility] main entrance of [facility] at xx:xx AM (30-45 minutes prior to test start time)  [facility] [place], [place] [address] [phone]  Proceed to the [facility] Radiology Department (First Floor).  Please follow these instructions carefully (unless otherwise directed):  Hold all erectile dysfunction medications at least 48 hours prior to test.  On the Night Before the Test: . Be sure to Drink plenty of water. . Do not consume any caffeinated/decaffeinated beverages or chocolate 12 hours prior to your test. . Do not take any antihistamines 12 hours prior to your test. . If you take Metformin do not take 24 hours prior to test. . If the patient has contrast allergy: ? Patient will need a prescription for Prednisone and very clear instructions (as follows): 1. Prednisone 50 mg - take 13 hours  prior to test 2. Take another Prednisone 50 mg 7 hours prior to test 3. Take another Prednisone 50 mg 1 hour prior to test 4. Take Benadryl 50 mg 1 hour prior to test . Patient must complete all four doses of above prophylactic medications. . Patient will need a ride after  test due to Benadryl.  On the Day of the Test: . Drink plenty of water. Do not drink any water within one hour of the test. . Do not eat any food 4 hours prior to the test. . You may take your regular medications prior to the test. Make certain that you take your Carvedilol (Coreg) the night before and morning of the test         After the Test: . Drink plenty of water. . After receiving IV contrast, you may experience a mild flushed feeling. This is normal. . On occasion, you may experience a mild rash up to 24 hours after the test. This is not dangerous. If this occurs, you can take Benadryl 25 mg and increase your fluid intake. . If you experience trouble breathing, this can be serious. If it is severe call 911 IMMEDIATELY. If it is mild, please call our office. . If you take any of these medications: Glipizide/Metformin, Avandament, Glucavance, please do not take 48 hours after completing test.   Follow-Up: At [facility], you and your health needs are our priority.  As part of our continuing mission to provide you with exceptional heart care, we have created designated Provider Care Teams.  These Care Teams include your primary Cardiologist (physician) and Advanced Practice Providers (APPs -  Physician Assistants and Nurse Practitioners) who all work together to provide you with the care you need, when you need it. You will need a follow up appointment in:  6 months.  Please call our office 2 months in advance to schedule this appointment.  You may see Dr. Acie Fredrickson or one of the following Advanced Practice Providers on your designated Care Team: Richardson Dopp, PA-C [place], [place] . Daune Perch, NP      Signed, Mertie Moores, MD  01/12/2018 5:41 PM    [place]

## 2018-02-10 ENCOUNTER — Other Ambulatory Visit: Payer: Medicare HMO | Admitting: *Deleted

## 2018-02-10 DIAGNOSIS — I5042 Chronic combined systolic (congestive) and diastolic (congestive) heart failure: Secondary | ICD-10-CM | POA: Diagnosis not present

## 2018-02-10 LAB — BASIC METABOLIC PANEL
BUN / CREAT RATIO: 18 (ref 12–28)
BUN: 15 mg/dL (ref 8–27)
CHLORIDE: 102 mmol/L (ref 96–106)
CO2: 23 mmol/L (ref 20–29)
Calcium: 9.7 mg/dL (ref 8.7–10.3)
Creatinine, Ser: 0.83 mg/dL (ref 0.57–1.00)
GFR calc non Af Amer: 75 mL/min/{1.73_m2} (ref >59)
GFR, EST AFRICAN AMERICAN: 86 mL/min/{1.73_m2} (ref >59)
GLUCOSE: 99 mg/dL (ref 65–99)
POTASSIUM: 4.2 mmol/L (ref 3.5–5.2)
Sodium: 141 mmol/L (ref 134–144)

## 2018-02-17 ENCOUNTER — Other Ambulatory Visit: Payer: Self-pay | Admitting: Medical

## 2018-02-17 ENCOUNTER — Telehealth: Payer: Self-pay | Admitting: Medical

## 2018-02-17 MED ORDER — EMPAGLIFLOZIN 10 MG PO TABS: 10.0000 mg | ORAL_TABLET | Freq: Every day | ORAL | 0 refills | Status: DC

## 2018-02-17 NOTE — Telephone Encounter (Signed)
I sent Jardiance to replace Invokana, but get in for 3 month med check

## 2018-02-17 NOTE — Telephone Encounter (Signed)
Pt left a voicemail and wanted to know if she could get her Invocana swiched back to [place]. She can be reached at [phone]

## 2018-02-18 NOTE — Telephone Encounter (Signed)
Ok. Got her scheduled for April

## 2018-02-18 NOTE — Telephone Encounter (Signed)
She said shes off work on Monday so I got her scheduled for that day.

## 2018-02-18 NOTE — Telephone Encounter (Signed)
Sorry for the confusion.  She needs med check NOW which would be her 3 month recheck, not appt in the next 3 months.  Please call back and get in NOW

## 2018-02-18 NOTE — Telephone Encounter (Signed)
Correction jan 21. She has another appt on jan 13

## 2018-02-20 ENCOUNTER — Telehealth (HOSPITAL_COMMUNITY): Payer: Self-pay | Admitting: Emergency Medicine

## 2018-02-20 NOTE — Telephone Encounter (Signed)
Left message on voicemail with name and callback number Angello Chien RN Navigator Cardiac Imaging [phone] 

## 2018-02-23 ENCOUNTER — Ambulatory Visit (HOSPITAL_COMMUNITY): Payer: Medicare HMO

## 2018-03-03 ENCOUNTER — Ambulatory Visit (INDEPENDENT_AMBULATORY_CARE_PROVIDER_SITE_OTHER): Payer: Medicare HMO | Admitting: Medical

## 2018-03-03 ENCOUNTER — Encounter: Payer: Self-pay | Admitting: Medical

## 2018-03-03 VITALS — BP 134/80 | HR 77 | Temp 97.9°F | Resp 16 | Ht 63.0 in | Wt 220.4 lb

## 2018-03-03 DIAGNOSIS — E785 Hyperlipidemia, unspecified: Secondary | ICD-10-CM | POA: Diagnosis not present

## 2018-03-03 DIAGNOSIS — I5041 Acute combined systolic (congestive) and diastolic (congestive) heart failure: Secondary | ICD-10-CM

## 2018-03-03 DIAGNOSIS — E118 Type 2 diabetes mellitus with unspecified complications: Secondary | ICD-10-CM | POA: Diagnosis not present

## 2018-03-03 DIAGNOSIS — E559 Vitamin D deficiency, unspecified: Secondary | ICD-10-CM | POA: Diagnosis not present

## 2018-03-03 DIAGNOSIS — Z23 Encounter for immunization: Secondary | ICD-10-CM | POA: Diagnosis not present

## 2018-03-03 DIAGNOSIS — M170 Bilateral primary osteoarthritis of knee: Secondary | ICD-10-CM | POA: Diagnosis not present

## 2018-03-03 DIAGNOSIS — I10 Essential (primary) hypertension: Secondary | ICD-10-CM

## 2018-03-03 DIAGNOSIS — Z7189 Other specified counseling: Secondary | ICD-10-CM

## 2018-03-03 DIAGNOSIS — I5042 Chronic combined systolic (congestive) and diastolic (congestive) heart failure: Secondary | ICD-10-CM

## 2018-03-03 DIAGNOSIS — Z7185 Encounter for immunization safety counseling: Secondary | ICD-10-CM

## 2018-03-03 HISTORY — DX: Chronic combined systolic (congestive) and diastolic (congestive) heart failure: I50.42

## 2018-03-03 MED ORDER — TRAMADOL HCL 50 MG PO TABS: 50.0000 mg | ORAL_TABLET | Freq: Two times a day (BID) | ORAL | 0 refills | Status: DC | PRN

## 2018-03-03 MED ORDER — LOSARTAN POTASSIUM 50 MG PO TABS: 50.0000 mg | ORAL_TABLET | Freq: Every day | ORAL | 3 refills | Status: DC

## 2018-03-03 NOTE — Progress Notes (Signed)
Subjective: Chief Complaint  Patient presents with  . med check    med check DM     Here for med check, but starts the conversation today discussing her knee pain.   Denies trauma, no fall.   Has seen orthopedist, Dr. Mayer Camel.  Last visit several months ago.  Needs surgery but is trying to delay until the summer time 2020.   Dr. Mayer Camel advised she has to get her weight down before they will do surgery.  Not currently getting routine medications from Dr. Paulo Fruit but would like some help with pain as she has a little bit worse flareup of late.  Diabetes-compliant with Jardiance 10 mg daily, metformin 1000 g twice daily.  No recent problems with hypoglycemia or high sugars.  Is trying to eat healthy, try to get some exercise.  No foot concerns.  Last visit we referred her to cardiology because of abnormal echocardiogram.  There was some confusion as she thought she was supposed to stop losartan and not amlodipine, but instead is taking amlodipine and not taking losartan.  She was started on Coreg which she is taking.  She has her coronary CT this Thursday.  She is compliant with vitamin D 1000 units daily  She does not think her insurance covers shingles vaccine.  No other complaint.   Past Medical History:  Diagnosis Date  . Anemia   . Asthma   . Diabetes mellitus without complication ([place]) 1962  . Diabetic eye exam [facility])    [employer]  . Elbow fracture, right 2007  . Former smoker    20 pack year history, quit 2010  . Hyperlipidemia   . Hypertension   . Insomnia   . Obesity   . PONV (postoperative nausea and vomiting) 2014   1 time  . Wears glasses    reading   Current Outpatient Medications on File Prior to Visit  Medication Sig Dispense Refill  . aspirin EC 81 MG tablet Take 1 tablet (81 mg total) by mouth daily. 90 tablet 3  . atorvastatin (LIPITOR) 40 MG tablet Take 1 tablet (40 mg total) by mouth daily. 90 tablet 3  . Blood Glucose Monitoring Suppl (ACCU-CHEK AVIVA PLUS)  w/Device KIT Test blood sugar 2 times daily 1 kit 0  . calcium-vitamin D 250-100 MG-UNIT per tablet Take 1 tablet by mouth 2 (two) times daily. Reported on 07/27/2015    . carvedilol (COREG) 3.125 MG tablet Take 1 tablet (3.125 mg total) by mouth 2 (two) times daily. 180 tablet 3  . cholecalciferol (VITAMIN D) 1000 units tablet Take 1 tablet (1,000 Units total) by mouth daily. 90 tablet 3  . co-enzyme Q-10 30 MG capsule Take 30 mg by mouth 3 (three) times daily. Reported on 07/27/2015    . empagliflozin (JARDIANCE) 10 MG TABS tablet Take 10 mg by mouth daily. 30 tablet 0  . glucose blood test strip Patient is to test BID. Needs accu-chek softclik test strips 200 each 4  . Lancet Devices (ACCU-CHEK SOFTCLIX) lancets Test sugar twice daily 200 each 4  . metFORMIN (GLUCOPHAGE) 1000 MG tablet Take 1 tablet (1,000 mg total) by mouth 2 (two) times daily with a meal. 180 tablet 3  . Multiple Vitamin (MULTIVITAMIN) tablet Take 1 tablet by mouth daily.    . vitamin E 400 UNIT capsule Take 400 Units by mouth daily.     Current Facility-Administered Medications on File Prior to Visit  Medication Dose Route Frequency Provider Last Rate Last Dose  .  0.9 %  sodium chloride infusion  500 mL Intravenous Continuous Danis, Estill Cotta III, MD       ROS as in subjective    Objective: BP 134/80   Pulse 77   Temp 97.9 F (36.6 C) (Oral)   Resp 16   Ht '5\' 3"'  (1.6 m)   Wt 220 lb 6.4 oz (100 kg)   SpO2 99%   BMI 39.04 kg/m   Wt Readings from Last 3 Encounters:  03/03/18 220 lb 6.4 oz (100 kg)  01/12/18 220 lb (99.8 kg)  11/13/17 229 lb (103.9 kg)    General appearance: alert, no distress, WD/WN,  Oral cavity: MMM, no lesions Neck: supple, no lymphadenopathy, no thyromegaly, no masses Heart: RRR, normal S1, S2, no murmurs Lungs: CTA bilaterally, no wheezes, rhonchi, or rales Abdomen: +bs, soft, non tender, non distended, no masses, no hepatomegaly, no splenomegaly Pulses: 2+ symmetric, upper extremities,  normal cap refill Ext: no edema Bony arthritic changes of both knees, mild generalized tenderness of both knees, no swelling, no laxity   Diabetic Foot Exam - Simple   Simple Foot Form Diabetic Foot exam was performed with the following findings:  Yes 03/03/2018 10:35 AM  Visual Inspection See comments:  Yes Sensation Testing Intact to touch and monofilament testing bilaterally:  Yes Pulse Check See comments:  Yes Comments 1+ pedal pulses, dorsal linear surgical scars over both great toes, no other deformity     Assessment: Encounter Diagnoses  Name Primary?  . Essential hypertension, benign Yes  . Diabetes mellitus with complication ([place])   . Primary osteoarthritis of both knees   . Hyperlipidemia, unspecified hyperlipidemia type   . Vaccine counseling   . Vitamin D deficiency   . Need for pneumococcal vaccination   . Acute combined systolic and diastolic heart failure ([place])      Plan: Hypertension- we discussed the miscommunication where she apparently stopped her oral medication after she saw cardiology.  Continue carvedilol 3.125 mg twice a day, began back on losartan, but stop amlodipine.  New diagnosis of CHF per recent cardiology note-I reviewed her recent cardiology note, she will follow-up with CT coronary scan this week.  Diabetes- routine labs today, continue Jardiance and metformin.  She will need refills pending lab results. discussed routine diabetic care, daily foot checks, yearly eye doctor visit, glucose monitoring.  Counseled on diet and exercise  Osteoarthritis of both knees- gave short-term Ultram to use as needed, follow-up with orthopedics  Hyperlipidemia-labs today, refills on statin pending labs  Vitamin D deficiency-labs today, continue vitamin D 1000 units daily, counseled on diet and sun exposure to help with vitamin D absorption  Counseled on the pneumococcal vaccine.  Vaccine information sheet given.  Pneumococcal vaccine Prevnar 13 given  after consent obtained.  Vaccine counseling: Shingles vaccine:  I recommend you have a shingles vaccine to help prevent shingles or herpes zoster outbreak.   Please call your insurer to inquire about coverage for the Shingrix vaccine given in 2 doses.   Some insurers cover this vaccine after age 48, some cover this after age 42.  If your insurer covers this, then call to schedule appointment to have this vaccine here.   Darlene Wade was seen today for med check.  Diagnoses and all orders for this visit:  Essential hypertension, benign  Diabetes mellitus with complication (HCC) -     Hemoglobin A1c  Primary osteoarthritis of both knees  Hyperlipidemia, unspecified hyperlipidemia type -     Lipid panel  Vaccine  counseling  Vitamin D deficiency -     VITAMIN D 25 Hydroxy (Vit-D Deficiency, Fractures)  Need for pneumococcal vaccination -     Pneumococcal conjugate vaccine 13-valent  Acute combined systolic and diastolic heart failure ([place])  Other orders -     traMADol (ULTRAM) 50 MG tablet; Take 1 tablet (50 mg total) by mouth 2 (two) times daily as needed. -     Cancel: Pneumococcal conjugate vaccine 13-valent -     losartan (COZAAR) 50 MG tablet; Take 1 tablet (50 mg total) by mouth daily.

## 2018-03-04 ENCOUNTER — Telehealth (HOSPITAL_COMMUNITY): Payer: Self-pay | Admitting: Emergency Medicine

## 2018-03-04 ENCOUNTER — Other Ambulatory Visit: Payer: Self-pay | Admitting: Medical

## 2018-03-04 DIAGNOSIS — E119 Type 2 diabetes mellitus without complications: Secondary | ICD-10-CM

## 2018-03-04 LAB — LIPID PANEL
Chol/HDL Ratio: 2.6 ratio (ref 0.0–4.4)
Cholesterol, Total: 144 mg/dL (ref 100–199)
HDL: 56 mg/dL (ref >39)
LDL Calculated: 67 mg/dL (ref 0–99)
Triglycerides: 107 mg/dL (ref 0–149)
VLDL Cholesterol Cal: 21 mg/dL (ref 5–40)

## 2018-03-04 LAB — HEMOGLOBIN A1C
ESTIMATED AVERAGE GLUCOSE: 131 mg/dL
Hgb A1c MFr Bld: 6.2 % — ABNORMAL HIGH (ref 4.8–5.6)

## 2018-03-04 LAB — VITAMIN D 25 HYDROXY (VIT D DEFICIENCY, FRACTURES): Vit D, 25-Hydroxy: 21.4 ng/mL — ABNORMAL LOW (ref 30.0–100.0)

## 2018-03-04 NOTE — Telephone Encounter (Signed)
Left message on voicemail with name and callback number Tationa Stech RN Navigator Cardiac Imaging [facility] [phone] Office [phone] Cell  

## 2018-03-05 ENCOUNTER — Ambulatory Visit (HOSPITAL_COMMUNITY)
Admission: RE | Admit: 2018-03-05 | Discharge: 2018-03-05 | Disposition: A | Discharge status: Home / Self Care | Payer: Medicare HMO | Source: Ambulatory Visit | Attending: Cardiovascular Disease | Admitting: Cardiovascular Disease

## 2018-03-05 DIAGNOSIS — I5042 Chronic combined systolic (congestive) and diastolic (congestive) heart failure: Secondary | ICD-10-CM | POA: Diagnosis not present

## 2018-03-05 MED ORDER — NITROGLYCERIN 0.4 MG SL SUBL
SUBLINGUAL_TABLET | SUBLINGUAL | Status: AC
  Filled 2018-03-05: qty 2

## 2018-03-05 MED ORDER — METOPROLOL TARTRATE 5 MG/5ML IV SOLN
10.0000 mg | INTRAVENOUS | Status: AC | PRN
  Administered 2018-03-05 (×3): 10 mg via INTRAVENOUS

## 2018-03-05 MED ORDER — DILTIAZEM HCL 25 MG/5ML IV SOLN
INTRAVENOUS | Status: AC
  Filled 2018-03-05: qty 5

## 2018-03-05 MED ORDER — DILTIAZEM HCL 25 MG/5ML IV SOLN
5.0000 mg | Freq: Once | INTRAVENOUS | Status: AC
  Administered 2018-03-05: 5 mg via INTRAVENOUS
  Filled 2018-03-05: qty 5

## 2018-03-05 MED ORDER — IOPAMIDOL (ISOVUE-370) INJECTION 76%
100.0000 mL | Freq: Once | INTRAVENOUS | Status: AC | PRN
  Administered 2018-03-05: 100 mL via INTRAVENOUS

## 2018-03-05 MED ORDER — METOPROLOL TARTRATE 5 MG/5ML IV SOLN
INTRAVENOUS | Status: AC
  Filled 2018-03-05: qty 30

## 2018-03-05 MED ORDER — NITROGLYCERIN 0.4 MG SL SUBL
0.8000 mg | SUBLINGUAL_TABLET | Freq: Once | SUBLINGUAL | Status: AC
  Administered 2018-03-05: 0.8 mg via SUBLINGUAL

## 2018-03-09 ENCOUNTER — Other Ambulatory Visit: Payer: Self-pay | Admitting: Medical

## 2018-03-10 MED ORDER — VITAMIN D 25 MCG (1000 UNIT) PO TABS: 2000.0000 [IU] | ORAL_TABLET | Freq: Every day | ORAL | 3 refills | Status: DC

## 2018-03-10 MED ORDER — ASPIRIN EC 81 MG PO TBEC: 81.0000 mg | DELAYED_RELEASE_TABLET | Freq: Every day | ORAL | 3 refills | Status: DC

## 2018-03-10 MED ORDER — EMPAGLIFLOZIN 10 MG PO TABS: 10.0000 mg | ORAL_TABLET | Freq: Every day | ORAL | 3 refills | Status: DC

## 2018-03-10 MED ORDER — METFORMIN HCL 1000 MG PO TABS: 1000.0000 mg | ORAL_TABLET | Freq: Two times a day (BID) | ORAL | 3 refills | Status: DC

## 2018-04-16 ENCOUNTER — Other Ambulatory Visit: Payer: Self-pay | Admitting: Family Medicine

## 2018-04-16 ENCOUNTER — Telehealth: Payer: Self-pay | Admitting: Family Medicine

## 2018-04-16 DIAGNOSIS — E119 Type 2 diabetes mellitus without complications: Secondary | ICD-10-CM

## 2018-04-16 MED ORDER — METFORMIN HCL 1000 MG PO TABS: 1000.0000 mg | ORAL_TABLET | Freq: Two times a day (BID) | ORAL | 3 refills | Status: DC

## 2018-04-16 NOTE — Telephone Encounter (Signed)
Changed pharmacy to [facility]

## 2018-04-29 ENCOUNTER — Telehealth: Payer: Self-pay | Admitting: Medical

## 2018-04-29 NOTE — Telephone Encounter (Signed)
See what she is wanting.  We can simply write a letter saying that she is under our care for several chronic medical problems, but not sure what or why she is wanting this or how specific it needs to be?

## 2018-04-29 NOTE — Telephone Encounter (Signed)
  Please write a letter stating that she is under our care for several chronic issues including heart disease, high blood pressure, diabetes, arthritis, and asthma.

## 2018-04-29 NOTE — Telephone Encounter (Signed)
Called pt and she stated that [employer] told them they wont be paid while out of work unless they have a note stating that they have a chronic illness. She said the letter can simply state that she has asthma.

## 2018-04-29 NOTE — Telephone Encounter (Signed)
forwarding

## 2018-04-29 NOTE — Telephone Encounter (Signed)
Pt called and wanted to know if she can get something for her job stating that she has a chronic condition. She said if possible can it be faxed or she can come by and pick it up. She can be reached at [phone]

## 2018-04-30 ENCOUNTER — Encounter: Payer: Self-pay | Admitting: Medical

## 2018-05-04 ENCOUNTER — Encounter: Payer: Self-pay | Admitting: Medical

## 2018-05-04 ENCOUNTER — Telehealth: Payer: Self-pay | Admitting: Medical

## 2018-05-04 NOTE — Telephone Encounter (Signed)
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  And she needs this in order to get paid for when she is out.  Please call her when ready

## 2018-05-04 NOTE — Telephone Encounter (Signed)
Letter typed with note that pt will continue to be treated during the time period of 04/28/18 thru 07/17/18.  Pt informed ready for pick up

## 2018-05-04 NOTE — Telephone Encounter (Signed)
What exactly is she needing?   Any thoughts on how to be worded?  She has several chronic issues.   Is she asking for varication of these chronic issues as in last phone message?  I can't "take her out of work," but not sure what employer is telling her or advising her?

## 2018-05-20 ENCOUNTER — Encounter: Payer: Medicare HMO | Admitting: Medical

## 2018-05-25 ENCOUNTER — Encounter: Payer: Medicare HMO | Admitting: Medical

## 2018-06-16 DIAGNOSIS — M1712 Unilateral primary osteoarthritis, left knee: Secondary | ICD-10-CM | POA: Diagnosis not present

## 2018-06-29 ENCOUNTER — Telehealth: Payer: Self-pay | Admitting: Medical

## 2018-06-29 NOTE — Telephone Encounter (Signed)
I received preop assessment from ortho.   Since its close to her 41mo f/u, lets schedule this.   We can do in person, or possibly virtual.

## 2018-06-29 NOTE — Telephone Encounter (Signed)
Pt is going to do a virtual visit on may the 28th

## 2018-07-02 DIAGNOSIS — E119 Type 2 diabetes mellitus without complications: Secondary | ICD-10-CM | POA: Diagnosis not present

## 2018-07-02 LAB — HM DIABETES EYE EXAM

## 2018-07-09 ENCOUNTER — Telehealth: Payer: Self-pay | Admitting: Medical

## 2018-07-09 ENCOUNTER — Encounter: Payer: Self-pay | Admitting: Medical

## 2018-07-09 ENCOUNTER — Other Ambulatory Visit: Payer: Self-pay

## 2018-07-09 ENCOUNTER — Ambulatory Visit (INDEPENDENT_AMBULATORY_CARE_PROVIDER_SITE_OTHER): Payer: Medicare HMO | Admitting: Medical

## 2018-07-09 VITALS — Ht 63.0 in | Wt 218.0 lb

## 2018-07-09 DIAGNOSIS — Z01818 Encounter for other preprocedural examination: Secondary | ICD-10-CM

## 2018-07-09 DIAGNOSIS — I10 Essential (primary) hypertension: Secondary | ICD-10-CM

## 2018-07-09 DIAGNOSIS — J4520 Mild intermittent asthma, uncomplicated: Secondary | ICD-10-CM | POA: Diagnosis not present

## 2018-07-09 DIAGNOSIS — E559 Vitamin D deficiency, unspecified: Secondary | ICD-10-CM | POA: Diagnosis not present

## 2018-07-09 DIAGNOSIS — E785 Hyperlipidemia, unspecified: Secondary | ICD-10-CM

## 2018-07-09 DIAGNOSIS — E118 Type 2 diabetes mellitus with unspecified complications: Secondary | ICD-10-CM

## 2018-07-09 DIAGNOSIS — M170 Bilateral primary osteoarthritis of knee: Secondary | ICD-10-CM

## 2018-07-09 MED ORDER — VITAMIN D 25 MCG (1000 UNIT) PO TABS: 2000.0000 [IU] | ORAL_TABLET | Freq: Every day | ORAL | 3 refills | Status: DC

## 2018-07-09 NOTE — Telephone Encounter (Signed)
Please call [facility], Dr. Mayer Camel to find out when they think Darlene Wade will be scheduled for surgery?  I want to time labs, exam, and PFT in a reasonable time.   If Darlene Wade is having surgery within 2 weeks, then I'll get her in tomorrow for eval.   If surgery not until end of June, I'll wait and get her in the next 2 weeks.    Darlene Wade also mentioned Darlene Wade would need a negative Covid test.  Does [facility] have a specific test site they have been using, and what is that test site turn around time on lab results?

## 2018-07-09 NOTE — Telephone Encounter (Signed)
Hello Dr. Acie Fredrickson,   Mrs. Treese is having total knee replacement next month in June.  I reviewed your recnet notes.  Is she safe to proceed with surgery, or do you need to see her back or do any other eval prior?  Thanks [employer]

## 2018-07-09 NOTE — Telephone Encounter (Signed)
Left message on voicemail for Darlene Wade the surgery scheduler to call me back to see when patient is having surgery.

## 2018-07-09 NOTE — Telephone Encounter (Signed)
Darlene Wade,  Darlene Wade is stable for knee surgery. She has mildly reduced LV function but has no CAD by coronary Ct angio. She should be at low risk for her surgery   Abbe [place]

## 2018-07-09 NOTE — Progress Notes (Addendum)
Subjective:     Patient ID: Darlene Wade, female   DOB: 1955/07/22, 63 y.o.   MRN: [id number]  This visit type was conducted due to national recommendations for restrictions regarding the COVID-19 Pandemic (e.g. social distancing) in an effort to limit this patient's exposure and mitigate transmission in our community.  Due to their co-morbid illnesses, this patient is at least at moderate risk for complications without adequate follow up.  This format is felt to be most appropriate for this patient at this time.    Documentation for virtual audio and video telecommunications through Zoom encounter:  The patient was located at home. The provider was located in the office. The patient did consent to this visit and is aware of possible charges through their insurance for this visit.  The other persons participating in this telemedicine service were none. Time spent on call was 20 minutes and in review of previous records >30 minutes total.  This virtual service is not related to other E/M service within previous 7 days.   HPI Chief Complaint  Patient presents with  . med check    med check and surgical clearance  sugar 110 today   Medical team: Darlene Hurl, PA-C here for primary care Dr. Frederik Wade at [facility] orthopedics Dr. Mertie Wade, cardiology Dr. Wilfrid Wade, GI  Virtual visit today for preop.  She is supposed to be having a left total knee replacement in June.  There has not been a specific date set yet.  In general she is doing fine.  No particular complaints at this time other than the left knee pain.  Her allergies no pain medications but she notes she can take them just gets nauseated.  She has no current chest pain, shortness of breath, edema, paresthesias.  She has a history of asthma but no recent complaints.  Not using any inhaler.  No orthopnea.  She needs a refill on vitamin D.  Her vitamin D was low at last visit and I start her on vitamin D 2000 units  a day  She is exercising with walking  Past Medical History:  Diagnosis Date  . Anemia   . Asthma   . Diabetes mellitus without complication ([place]) 9381  . Diabetic eye exam [facility])    [facility]  . Elbow fracture, right 2007  . Former smoker    20 pack year history, quit 2010  . Hyperlipidemia   . Hypertension   . Insomnia   . Obesity   . PONV (postoperative nausea and vomiting) 2014   1 time  . Wears glasses    reading   Current Outpatient Medications on File Prior to Visit  Medication Sig Dispense Refill  . aspirin EC 81 MG tablet Take 1 tablet (81 mg total) by mouth daily. 90 tablet 3  . atorvastatin (LIPITOR) 40 MG tablet TAKE 1 TABLET EVERY DAY 90 tablet 1  . Blood Glucose Monitoring Suppl (ACCU-CHEK AVIVA PLUS) w/Device KIT Test blood sugar 2 times daily 1 kit 0  . calcium-vitamin D 250-100 MG-UNIT per tablet Take 1 tablet by mouth 2 (two) times daily. Reported on 07/27/2015    . carvedilol (COREG) 3.125 MG tablet Take 1 tablet (3.125 mg total) by mouth 2 (two) times daily. 180 tablet 3  . empagliflozin (JARDIANCE) 10 MG TABS tablet Take 10 mg by mouth daily. 90 tablet 3  . losartan (COZAAR) 50 MG tablet Take 1 tablet (50 mg total) by mouth daily. 90 tablet 3  .  metFORMIN (GLUCOPHAGE) 1000 MG tablet Take 1 tablet (1,000 mg total) by mouth 2 (two) times daily with a meal. 180 tablet 3  . Multiple Vitamin (MULTIVITAMIN) tablet Take 1 tablet by mouth daily.    . vitamin E 400 UNIT capsule Take 400 Units by mouth daily.    . traMADol (ULTRAM) 50 MG tablet Take 1 tablet (50 mg total) by mouth 2 (two) times daily as needed. (Patient not taking: Reported on 07/09/2018) 20 tablet 0   Current Facility-Administered Medications on File Prior to Visit  Medication Dose Route Frequency Provider Last Rate Last Dose  . 0.9 %  sodium chloride infusion  500 mL Intravenous Continuous Darlene Meuse III, MD        Review of Systems As in subjective    Objective:   Physical Exam Due to  coronavirus pandemic stay at home measures, patient visit was virtual and they were not examined in person.   General: Well-developed well-nourished no acute distress     Assessment:     Encounter Diagnoses  Name Primary?  . Preop examination Yes  . Essential hypertension, benign   . Mild intermittent asthma without complication   . Diabetes mellitus with complication ([place])   . Primary osteoarthritis of both knees   . Hyperlipidemia, unspecified hyperlipidemia type   . Morbid obesity ([place])   . Vitamin D deficiency        Plan:     I reviewed her medications.  She is compliant with medications.  I reviewed her cardiology visit from 01/2018, reviewed her October 2018 echocardiogram and CT coronary score.  Reviewed 11/2017 EKG.   reviewed 02/2018 labs.     I will coordinate with ortho on dates of surgery so I can decide when to bring her in for labs, exam, vital signs, and repeat PFT.   PFT last year was abnormal, but she voices no respiratory concerns at this time with her history of asthma.   I don't foresee any issues preventing surgery at this time.  Addendum: I heard from her cardiologist today, Dr. Acie Wade, and his notes are as follows: [name] is stable for knee surgery. She has mildly reduced LV function but has no CAD by coronary Ct angio. She should be at low risk for her surgery     Darlene Wade was seen today for med check.  Diagnoses and all orders for this visit:  Preop examination -     Comprehensive metabolic panel; Future -     CBC with Differential/Platelet; Future -     Hemoglobin A1c; Future -     Protime-INR; Future -     APTT; Future  Essential hypertension, benign -     Microalbumin/Creatinine Ratio, Urine; Future  Mild intermittent asthma without complication  Diabetes mellitus with complication (HCC) -     Hemoglobin A1c; Future -     Microalbumin/Creatinine Ratio, Urine; Future  Primary osteoarthritis of both knees  Hyperlipidemia,  unspecified hyperlipidemia type  Morbid obesity ([place])  Vitamin D deficiency  Other orders -     cholecalciferol (VITAMIN D3) 25 MCG (1000 UT) tablet; Take 2 tablets (2,000 Units total) by mouth daily.

## 2018-07-10 NOTE — Telephone Encounter (Signed)
Per Wells Guiles at [facility] (Dr Mayer Camel) patient has not been scheduled for surgery until her clearance gets approved.

## 2018-07-10 NOTE — Telephone Encounter (Signed)
Ok, then bring her in next week June 1st week for labs, PFTs, and examination by me

## 2018-07-13 NOTE — Telephone Encounter (Signed)
Patient coming in on Wednesday 07-15-18.

## 2018-07-15 ENCOUNTER — Other Ambulatory Visit: Payer: Self-pay

## 2018-07-15 ENCOUNTER — Encounter: Payer: Self-pay | Admitting: Medical

## 2018-07-15 ENCOUNTER — Ambulatory Visit (INDEPENDENT_AMBULATORY_CARE_PROVIDER_SITE_OTHER): Payer: Medicare HMO | Admitting: Medical

## 2018-07-15 ENCOUNTER — Other Ambulatory Visit: Payer: Self-pay | Admitting: Medical

## 2018-07-15 VITALS — BP 120/70 | HR 94 | Temp 97.8°F | Resp 16 | Ht 63.0 in | Wt 222.0 lb

## 2018-07-15 DIAGNOSIS — Z130 Encounter for screening for diseases of the blood and blood-forming organs and certain disorders involving the immune mechanism: Secondary | ICD-10-CM | POA: Diagnosis not present

## 2018-07-15 DIAGNOSIS — Z01818 Encounter for other preprocedural examination: Secondary | ICD-10-CM

## 2018-07-15 DIAGNOSIS — T148XXA Other injury of unspecified body region, initial encounter: Secondary | ICD-10-CM

## 2018-07-15 DIAGNOSIS — I10 Essential (primary) hypertension: Secondary | ICD-10-CM | POA: Diagnosis not present

## 2018-07-15 DIAGNOSIS — J4520 Mild intermittent asthma, uncomplicated: Secondary | ICD-10-CM

## 2018-07-15 DIAGNOSIS — E118 Type 2 diabetes mellitus with unspecified complications: Secondary | ICD-10-CM | POA: Diagnosis not present

## 2018-07-15 MED ORDER — FLUTICASONE FUROATE-VILANTEROL 100-25 MCG/INH IN AEPB: 1.0000 | INHALATION_SPRAY | Freq: Every day | RESPIRATORY_TRACT | 0 refills | Status: DC

## 2018-07-15 NOTE — Progress Notes (Addendum)
Subjective: Chief Complaint  Patient presents with  . surgical clearance    surgical clearance   Here for f/u from recent virtual preop consult.     Here for labs, exam and PFT.  She has hx/o tobacco use, quit in 2011, smoking 25-30 years at 1ppd.  Sometimes gets out of breath with exercise and allergies can flare up breathing.  Occasionally has used albuterol prior.  She stepped on a nail at her house yesterday, left heel.   Mild tenderness, but no swelling or redness.  No drainage   Past Medical History:  Diagnosis Date  . Anemia   . Asthma   . Diabetes mellitus without complication ([place]) 2694  . Diabetic eye exam [facility])    [facility]  . Elbow fracture, right 2007  . Former smoker    20 pack year history, quit 2010  . Hyperlipidemia   . Hypertension   . Insomnia   . Obesity   . PONV (postoperative nausea and vomiting) 2014   1 time  . Wears glasses    reading   Current Outpatient Medications on File Prior to Visit  Medication Sig Dispense Refill  . aspirin EC 81 MG tablet Take 1 tablet (81 mg total) by mouth daily. 90 tablet 3  . Blood Glucose Monitoring Suppl (ACCU-CHEK AVIVA PLUS) w/Device KIT Test blood sugar 2 times daily 1 kit 0  . carvedilol (COREG) 3.125 MG tablet Take 1 tablet (3.125 mg total) by mouth 2 (two) times daily. 180 tablet 3  . cholecalciferol (VITAMIN D3) 25 MCG (1000 UT) tablet Take 2 tablets (2,000 Units total) by mouth daily. 180 tablet 3  . empagliflozin (JARDIANCE) 10 MG TABS tablet Take 10 mg by mouth daily. 90 tablet 3  . losartan (COZAAR) 50 MG tablet Take 1 tablet (50 mg total) by mouth daily. 90 tablet 3  . metFORMIN (GLUCOPHAGE) 1000 MG tablet Take 1 tablet (1,000 mg total) by mouth 2 (two) times daily with a meal. 180 tablet 3  . Multiple Vitamin (MULTIVITAMIN) tablet Take 1 tablet by mouth daily.    . traMADol (ULTRAM) 50 MG tablet Take 1 tablet (50 mg total) by mouth 2 (two) times daily as needed. 20 tablet 0  . vitamin E 400 UNIT  capsule Take 400 Units by mouth daily.     Current Facility-Administered Medications on File Prior to Visit  Medication Dose Route Frequency Provider Last Rate Last Dose  . 0.9 %  sodium chloride infusion  500 mL Intravenous Continuous Danis, Estill Cotta III, MD       ROS as in subjective  Objective: BP 120/70   Pulse 94   Temp 97.8 F (36.6 C) (Temporal)   Resp 16   Ht _0  (1.6 m)   Wt 222 lb (100.7 kg)   SpO2 98%   BMI 39.33 kg/m     General appearance: alert, no distress, WD/WN, AA female Skin: left heel posterior volar surface with puncture wound benign appearing, no surrounding erythema or pus or warmth, mild tenderness at the site HEENT: normocephalic, sclerae anicteric, PERRLA, EOMi, nares patent, no discharge or erythema, pharynx normal Oral cavity: MMM, no lesions Neck: supple, no lymphadenopathy, no thyromegaly, no masses Heart: RRR, normal S1, S2, no murmurs Lungs: CTA bilaterally, no wheezes, rhonchi, or rales Abdomen: +bs, soft, non tender, non distended, no masses, no hepatomegaly, no splenomegaly Extremities: no edema, no cyanosis, no clubbing, prior surgical scars over dorsal bilat feet Pulses: 2+ symmetric, upper and lower extremities, normal  cap refill Neurological: alert, oriented x 3, CN2-12 intact, strength normal upper extremities and lower extremities, sensation normal throughout, DTRs 2+ throughout, no cerebellar signs, gait normal Psychiatric: normal affect, behavior normal, pleasant     Assessment: Encounter Diagnoses  Name Primary?  . Preop examination   . Essential hypertension, benign   . Diabetes mellitus with complication ([place])   . Mild intermittent asthma without complication Yes  . Puncture wound      Plan: PFT reviewed.   Abnormal.  Begin trial of Breo to see if she can feel a difference in breathing over the next week.   Gave samples and discussed proper use.  Last chest xray showed atherosclerosis from 04/2017, but lungs  clear.  Former smoker   Puncture wound - Td up to date.  Discussed wound care, and watch for signs of infection that would prompt recheck  See 07/09/18 virtual visit that was done in conjunction with today's visit.   Arlesia was seen today for surgical clearance.  Diagnoses and all orders for this visit:  Mild intermittent asthma without complication -     Spirometry with Graph  Preop examination -     APTT -     Protime-INR -     Hemoglobin A1c -     CBC with Differential/Platelet -     Comprehensive metabolic panel  Essential hypertension, benign -     Microalbumin/Creatinine Ratio, Urine  Diabetes mellitus with complication (HCC) -     Microalbumin/Creatinine Ratio, Urine -     Hemoglobin A1c  Puncture wound  Other orders -     fluticasone furoate-vilanterol (BREO ELLIPTA) 100-25 MCG/INH AEPB; Inhale 1 puff into the lungs daily.

## 2018-07-16 LAB — MICROALBUMIN / CREATININE URINE RATIO
Creatinine, Urine: 119.7 mg/dL
Microalb/Creat Ratio: 4 mg/g creat (ref 0–29)
Microalbumin, Urine: 4.2 ug/mL

## 2018-07-16 LAB — CBC WITH DIFFERENTIAL/PLATELET
Basophils Absolute: 0 10*3/uL (ref 0.0–0.2)
Basos: 1 %
EOS (ABSOLUTE): 0.3 10*3/uL (ref 0.0–0.4)
Eos: 4 %
Hematocrit: 37.7 % (ref 34.0–46.6)
Hemoglobin: 12.2 g/dL (ref 11.1–15.9)
Immature Grans (Abs): 0 10*3/uL (ref 0.0–0.1)
Immature Granulocytes: 0 %
Lymphocytes Absolute: 2.4 10*3/uL (ref 0.7–3.1)
Lymphs: 29 %
MCH: 27.5 pg (ref 26.6–33.0)
MCHC: 32.4 g/dL (ref 31.5–35.7)
MCV: 85 fL (ref 79–97)
Monocytes Absolute: 0.7 10*3/uL (ref 0.1–0.9)
Monocytes: 8 %
Neutrophils Absolute: 4.8 10*3/uL (ref 1.4–7.0)
Neutrophils: 58 %
Platelets: 253 10*3/uL (ref 150–450)
RBC: 4.44 x10E6/uL (ref 3.77–5.28)
RDW: 15 % (ref 11.7–15.4)
WBC: 8.2 10*3/uL (ref 3.4–10.8)

## 2018-07-16 LAB — COMPREHENSIVE METABOLIC PANEL
ALT: 12 IU/L (ref 0–32)
AST: 15 IU/L (ref 0–40)
Albumin/Globulin Ratio: 1.4 (ref 1.2–2.2)
Albumin: 4.2 g/dL (ref 3.8–4.8)
Alkaline Phosphatase: 99 IU/L (ref 39–117)
BUN/Creatinine Ratio: 22 (ref 12–28)
BUN: 18 mg/dL (ref 8–27)
Bilirubin Total: 0.4 mg/dL (ref 0.0–1.2)
CO2: 22 mmol/L (ref 20–29)
Calcium: 9.9 mg/dL (ref 8.7–10.3)
Chloride: 102 mmol/L (ref 96–106)
Creatinine, Ser: 0.82 mg/dL (ref 0.57–1.00)
GFR calc Af Amer: 87 mL/min/{1.73_m2} (ref >59)
GFR calc non Af Amer: 76 mL/min/{1.73_m2} (ref >59)
Globulin, Total: 3.1 g/dL (ref 1.5–4.5)
Glucose: 117 mg/dL — ABNORMAL HIGH (ref 65–99)
Potassium: 4.2 mmol/L (ref 3.5–5.2)
Sodium: 140 mmol/L (ref 134–144)
Total Protein: 7.3 g/dL (ref 6.0–8.5)

## 2018-07-16 LAB — PROTIME-INR
INR: 1 (ref 0.8–1.2)
Prothrombin Time: 10.4 s (ref 9.1–12.0)

## 2018-07-16 LAB — HEMOGLOBIN A1C
Est. average glucose Bld gHb Est-mCnc: 146 mg/dL
Hgb A1c MFr Bld: 6.7 % — ABNORMAL HIGH (ref 4.8–5.6)

## 2018-07-16 LAB — APTT: aPTT: 33 s (ref 24–33)

## 2018-07-17 ENCOUNTER — Telehealth: Payer: Self-pay | Admitting: *Deleted

## 2018-07-17 NOTE — Telephone Encounter (Signed)
Lvm for patient to cal back about virtual set up for office appointment

## 2018-07-20 ENCOUNTER — Other Ambulatory Visit: Payer: Self-pay | Admitting: Orthopedic Surgery

## 2018-07-20 DIAGNOSIS — J45909 Unspecified asthma, uncomplicated: Secondary | ICD-10-CM

## 2018-07-20 HISTORY — DX: Unspecified asthma, uncomplicated: J45.909

## 2018-07-21 NOTE — Progress Notes (Signed)
Virtual Visit via Telephone Note   This visit type was conducted due to national recommendations for restrictions regarding the COVID-19 Pandemic (e.g. social distancing) in an effort to limit this patient's exposure and mitigate transmission in our community.  Due to her co-morbid illnesses, this patient is at least at moderate risk for complications without adequate follow up.  This format is felt to be most appropriate for this patient at this time.  The patient did not have access to video technology/had technical difficulties with video requiring transitioning to audio format only (telephone).  All issues noted in this document were discussed and addressed.  No physical exam could be performed with this format.  Please refer to the patient's chart for her  consent to telehealth for [facility]   Date:  07/22/2018   ID:  Darlene Wade, DOB [birth year], MRN [id number]  Patient Location: Home Provider Location: Home  PCP:  Carlena Hurl, PA-C  Cardiologist:  Mertie Moores, MD   Electrophysiologist:  None   Evaluation Performed:  Follow-Up Visit  Chief Complaint:  FU on CHF  History of Present Illness:    Darlene Wade is a 63 y.o. female with:  Combined systolic and diastolic HF  Non-Ischemic CM - EF 40-45 by Echo in 11/2017  Diabetes  Hypertension   Hyperlipidemia   CTA in 04/2018:  Mild non obs CAD  RLL pulmonary nodule by CTA in 04/2018 >> repeat CT need in 3 mos  Ex-smoker  Asthma  She established with Dr. Acie Fredrickson in 01/2018.    Today, she notes she is doing well.  She recently had PFTs with her PCP and was diagnosed with asthma.  She is now on Breo.  Her breathing is improved.  She has not had orthopnea, paroxysmal nocturnal dyspnea, chest discomfort, syncope.  The patient does not have symptoms concerning for COVID-19 infection (fever, chills, cough, or new shortness of breath).    Past Medical History:  Diagnosis Date  . Anemia   . Asthma   .  Diabetes mellitus without complication ([place]) 9485  . Diabetic eye exam [facility])    [facility]  . Elbow fracture, right 2007  . Former smoker    20 pack year history, quit 2010  . Hyperlipidemia   . Hypertension   . Insomnia   . Obesity   . PONV (postoperative nausea and vomiting) 2014   1 time  . Wears glasses    reading   Past Surgical History:  Procedure Laterality Date  . BREAST CYST ASPIRATION  2015  . BREAST EXCISIONAL BIOPSY Left   . BREAST LUMPECTOMY WITH NEEDLE LOCALIZATION Left 10/07/2012   Procedure: BREAST LUMPECTOMY WITH NEEDLE LOCALIZATION;  Surgeon: Imogene Burn. Georgette Dover, MD;  Location: [facility];  Service: General;  Laterality: Left;  . BREAST SURGERY    . COLONOSCOPY  01/2016   01/2016 with Dr. Loletha Carrow, tubular adenoma polpy; 2003 with Dr. Collene Mares  . ELBOW ARTHROPLASTY  2006   rt-fx  . FOOT ARTHROTOMY  11/12   foot fusion, right  . FOOT MASS EXCISION  3/14   left-fusion  . PARTIAL HYSTERECTOMY  age 67   uterine fibroids, still has ovaries  . TONSILLECTOMY       Current Meds  Medication Sig  . aspirin EC 81 MG tablet Take 1 tablet (81 mg total) by mouth daily.  Marland Kitchen atorvastatin (LIPITOR) 40 MG tablet TAKE 1 TABLET EVERY DAY  . Blood Glucose Monitoring Suppl (ACCU-CHEK AVIVA PLUS) w/Device  KIT Test blood sugar 2 times daily  . carvedilol (COREG) 3.125 MG tablet Take 1 tablet (3.125 mg total) by mouth 2 (two) times daily.  . cholecalciferol (VITAMIN D3) 25 MCG (1000 UT) tablet Take 2 tablets (2,000 Units total) by mouth daily.  . empagliflozin (JARDIANCE) 10 MG TABS tablet Take 10 mg by mouth daily.  . fluticasone furoate-vilanterol (BREO ELLIPTA) 100-25 MCG/INH AEPB Inhale 1 puff into the lungs daily.  Marland Kitchen losartan (COZAAR) 50 MG tablet Take 1 tablet (50 mg total) by mouth daily.  . metFORMIN (GLUCOPHAGE) 1000 MG tablet Take 1 tablet (1,000 mg total) by mouth 2 (two) times daily with a meal.  . Multiple Vitamin (MULTIVITAMIN) tablet Take 1 tablet by mouth  daily.  . traMADol (ULTRAM) 50 MG tablet Take 1 tablet (50 mg total) by mouth 2 (two) times daily as needed.  . vitamin E 400 UNIT capsule Take 400 Units by mouth daily.   Current Facility-Administered Medications for the 07/22/18 encounter (Telemedicine) with Richardson Dopp T, PA-C  Medication  . 0.9 %  sodium chloride infusion     Allergies:   Oxycodone; Naproxen; and Vicodin [hydrocodone-acetaminophen]   Social History   Tobacco Use  . Smoking status: Former Smoker    Packs/day: 1.00    Years: 20.00    Pack years: 20.00    Last attempt to quit: 04/11/2009    Years since quitting: 9.2  . Smokeless tobacco: Never Used  Substance Use Topics  . Alcohol use: No    Alcohol/week: 1.0 standard drinks    Types: 1 Glasses of wine per week  . Drug use: No     Family Hx: The patient's family history includes Diabetes in her maternal aunt and mother; Heart disease in her maternal grandfather; Hypertension in her mother. There is no history of Cancer.  ROS:   Please see the history of present illness.    All other systems reviewed and are negative.   Prior CV studies:   The following studies were reviewed today:  Coronary CTA 05/11/2018 Coronary Arteries:  Normal coronary origin.  Right dominance. RCA is a medium caliber dominant artery that gives rise to PDA and PLVB. There is minimal non-calcified plaque. Left main is a large artery that gives rise to LAD, ramus intermedius and LCX arteries. Left main has no plaque. LAD is a large vessel that has mild plaque in the ostial portion and mid portion with stenosis 25-50%. Ramus intermedius is a large artery that has no plaque. LCX is a non-dominant artery that gives rise to one large OM1 branch. There is no plaque. Other findings: Normal pulmonary vein drainage into the left atrium. Normal let atrial appendage without a thrombus. Normal size of the pulmonary artery. IMPRESSION: 1. Coronary calcium score of 9. This was 51  percentile for age and sex matched control. 2. Normal coronary origin with right dominance. 3. Mild non-obstructive CAD. Aggressive risk factor modification is recommended. FFR:  No significant stenosis Extra-Cardiac:  ?RLL nodule >> rec repeat CT in 3 mos.  Echo 12/08/17 EF 40-45, Gr 1 DD  Labs/Other Tests and Data Reviewed:    EKG:  No ECG reviewed.  Recent Labs: 07/15/2018: ALT 12; BUN 18; Creatinine, Ser 0.82; Hemoglobin 12.2; Platelets 253; Potassium 4.2; Sodium 140   Recent Lipid Panel Lab Results  Component Value Date/Time   CHOL 144 03/03/2018 09:40 AM   TRIG 107 03/03/2018 09:40 AM   HDL 56 03/03/2018 09:40 AM   CHOLHDL 2.6 03/03/2018 09:40 AM  CHOLHDL 2.0 05/22/2016 10:14 AM   LDLCALC 67 03/03/2018 09:40 AM     Wt Readings from Last 3 Encounters:  07/22/18 218 lb (98.9 kg)  07/15/18 222 lb (100.7 kg)  07/09/18 218 lb (98.9 kg)     Objective:    Vital Signs:  BP 120/74   Pulse 79   Ht '5\' 3"'  (1.6 m)   Wt 218 lb (98.9 kg)   BMI 38.62 kg/m    VITAL SIGNS:  reviewed GEN:  no acute distress RESPIRATORY:  No labored breathing NEURO:  Alert and oriented PSYCH:  Normal mood  ASSESSMENT & PLAN:     Chronic combined systolic and diastolic heart failure (HCC) CT with no significant CAD.  Nonischemic cardiomyopathy.  EF 40-45.  NYHA 2.  Volume status seems stable.  Blood pressure is well controlled.  At this point, I recommend she continue on her current dose of beta-blocker and ARB.  We will plan follow-up in 3 to 4 months with an echo prior to reassess her LV function.  Mild Coronary Artery Plaque on CT in 04/2018 No significant CAD on coronary CTA.  Continue aspirin, statin.  She denies angina.  Essential hypertension, benign The patient's blood pressure is controlled on her current regimen.  Continue current therapy.   Lung nodule Possible right lower lobe lung nodule noted on coronary CTA.  Follow-up CT was recommended in 3 months.  I will arrange a  noncontrast CT for follow-up.  COVID-19 Education: The signs and symptoms of COVID-19 were discussed with the patient and how to seek care for testing (follow up with PCP or arrange E-visit).  The importance of social distancing was discussed today.  Time:   Today, I have spent 12 minutes with the patient with telehealth technology discussing the above problems.     Medication Adjustments/Labs and Tests Ordered: Current medicines are reviewed at length with the patient today.  Concerns regarding medicines are outlined above.   Tests Ordered: Orders Placed This Encounter  Procedures  . CT CHEST WO CONTRAST  . ECHOCARDIOGRAM COMPLETE    Medication Changes: No orders of the defined types were placed in this encounter.   Disposition:  Follow up in 4 month(s) with Dr. Acie Fredrickson or Richardson Dopp, PA-C   Signed, Richardson Dopp, PA-C  07/22/2018 9:44 AM    [place]

## 2018-07-21 NOTE — Telephone Encounter (Signed)
Confirm consent - "In the setting of the current Covid19 crisis, you are scheduled for a (phone or video) visit with your provider on (date) at (time).  Just as we do with many in-office visits, in order for you to participate in this visit, we must obtain consent.  If you'd like, I can send this to your mychart (if signed up) or email for you to review.  Otherwise, I can obtain your verbal consent now.  All virtual visits are billed to your insurance company just like a normal visit would be.  By agreeing to a virtual visit, we'd like you to understand that the technology does not allow for your provider to perform an examination, and thus may limit your provider's ability to fully assess your condition. If your provider identifies any concerns that need to be evaluated in person, we will make arrangements to do so.  Finally, though the technology is pretty good, we cannot assure that it will always work on either your or our end, and in the setting of a video visit, we may have to convert it to a phone-only visit.  In either situation, we cannot ensure that we have a secure connection.  Are you willing to proceed?" STAFF: Did the patient verbally acknowledge consent to telehealth visit? Document YES/NO here:  YES   TELEPHONE CALL NOTE  Darlene Wade has been deemed a candidate for a follow-up tele-health visit to limit community exposure during the Covid-19 pandemic. I spoke with the patient via phone to ensure availability of phone/video source, confirm preferred email & phone number, and discuss instructions and expectations.  I reminded [name] to be prepared with any vital sign and/or heart rhythm information that could potentially be obtained via home monitoring, at the time of her visit. I reminded [name] to expect a phone call prior to her visit.  Claude Manges, [place] 07/21/2018 9:39 AM    FULL LENGTH CONSENT FOR TELE-HEALTH VISIT   I hereby voluntarily request, consent and  authorize [facility] and its employed or contracted physicians, physician assistants, nurse practitioners or other licensed health care professionals (the Practitioner), to provide me with telemedicine health care services (the "Services") as deemed necessary by the treating Practitioner. I acknowledge and consent to receive the Services by the Practitioner via telemedicine. I understand that the telemedicine visit will involve communicating with the Practitioner through live audiovisual communication technology and the disclosure of certain medical information by electronic transmission. I acknowledge that I have been given the opportunity to request an in-person assessment or other available alternative prior to the telemedicine visit and am voluntarily participating in the telemedicine visit.  I understand that I have the right to withhold or withdraw my consent to the use of telemedicine in the course of my care at any time, without affecting my right to future care or treatment, and that the Practitioner or I may terminate the telemedicine visit at any time. I understand that I have the right to inspect all information obtained and/or recorded in the course of the telemedicine visit and may receive copies of available information for a reasonable fee.  I understand that some of the potential risks of receiving the Services via telemedicine include:  Marland Kitchen Delay or interruption in medical evaluation due to technological equipment failure or disruption; . Information transmitted may not be sufficient (e.g. poor resolution of images) to allow for appropriate medical decision making by the Practitioner; and/or  . In rare instances,  security protocols could fail, causing a breach of personal health information.  Furthermore, I acknowledge that it is my responsibility to provide information about my medical history, conditions and care that is complete and accurate to the best of my ability. I acknowledge that  Practitioner's advice, recommendations, and/or decision may be based on factors not within their control, such as incomplete or inaccurate data provided by me or distortions of diagnostic images or specimens that may result from electronic transmissions. I understand that the practice of medicine is not an exact science and that Practitioner makes no warranties or guarantees regarding treatment outcomes. I acknowledge that I will receive a copy of this consent concurrently upon execution via email to the email address I last provided but may also request a printed copy by calling the office of [facility].    I understand that my insurance will be billed for this visit.   I have read or had this consent read to me. . I understand the contents of this consent, which adequately explains the benefits and risks of the Services being provided via telemedicine.  . I have been provided ample opportunity to ask questions regarding this consent and the Services and have had my questions answered to my satisfaction. . I give my informed consent for the services to be provided through the use of telemedicine in my medical care  By participating in this telemedicine visit I agree to the above.

## 2018-07-22 ENCOUNTER — Encounter: Payer: Self-pay | Admitting: Physician Assistant

## 2018-07-22 ENCOUNTER — Other Ambulatory Visit: Payer: Self-pay

## 2018-07-22 ENCOUNTER — Ambulatory Visit: Payer: Medicare HMO | Admitting: Physician Assistant

## 2018-07-22 ENCOUNTER — Telehealth (INDEPENDENT_AMBULATORY_CARE_PROVIDER_SITE_OTHER): Payer: Medicare HMO | Admitting: Physician Assistant

## 2018-07-22 VITALS — BP 120/74 | HR 79 | Ht 63.0 in | Wt 218.0 lb

## 2018-07-22 DIAGNOSIS — I5042 Chronic combined systolic (congestive) and diastolic (congestive) heart failure: Secondary | ICD-10-CM

## 2018-07-22 DIAGNOSIS — Z7189 Other specified counseling: Secondary | ICD-10-CM

## 2018-07-22 DIAGNOSIS — R918 Other nonspecific abnormal finding of lung field: Secondary | ICD-10-CM

## 2018-07-22 DIAGNOSIS — I10 Essential (primary) hypertension: Secondary | ICD-10-CM

## 2018-07-22 DIAGNOSIS — I2510 Atherosclerotic heart disease of native coronary artery without angina pectoris: Secondary | ICD-10-CM

## 2018-07-22 NOTE — Patient Instructions (Signed)
Medication Instructions:  Your physician recommends that you continue on your current medications as directed. Please refer to the Current Medication list given to you today.  If you need a refill on your cardiac medications before your next appointment, please call your pharmacy.   Lab work: NONE ORDERED  TODAY   If you have labs (blood work) drawn today and your tests are completely normal, you will receive your results only by: Marland Kitchen MyChart Message (if you have MyChart) OR . A paper copy in the mail If you have any lab test that is abnormal or we need to change your treatment, we will call you to review the results.  Testing/Procedures:Your physician has requested that you have an echocardiogram. Echocardiography is a painless test that uses sound waves to create images of your heart. It provides your doctor with information about the size and shape of your heart and how well your heart's chambers and valves are working. This procedure takes approximately one hour. There are no restrictions for this procedure.  Non-Cardiac CT scanning, (CAT scanning), is a noninvasive, special x-ray that produces cross-sectional images of the body using x-rays and a computer. CT scans help physicians diagnose and treat medical conditions. For some CT exams, a contrast material is used to enhance visibility in the area of the body being studied. CT scans provide greater clarity and reveal more details than regular x-ray exams.  Follow-Up: At [facility], you and your health needs are our priority.  As part of our continuing mission to provide you with exceptional heart care, we have created designated Provider Care Teams.  These Care Teams include your primary Cardiologist (physician) and Advanced Practice Providers (APPs -  Physician Assistants and Nurse Practitioners) who all work together to provide you with the care you need, when you need it. You will need a follow up appointment in:  4 months.  Please call  our office 2 months in advance to schedule this appointment.  You may see Mertie Moores, MD or one of the following Advanced Practice Providers on your designated Care Team: Richardson Dopp, PA-C [place], [place] . Daune Perch, NP  Any Other Special Instructions Will Be Listed Below (If Applicable).

## 2018-07-24 ENCOUNTER — Telehealth: Payer: Self-pay | Admitting: *Deleted

## 2018-07-24 ENCOUNTER — Telehealth (HOSPITAL_COMMUNITY): Payer: Self-pay | Admitting: Radiology

## 2018-07-24 NOTE — Telephone Encounter (Signed)

## 2018-07-24 NOTE — Telephone Encounter (Signed)

## 2018-07-27 ENCOUNTER — Other Ambulatory Visit: Payer: Self-pay

## 2018-07-27 ENCOUNTER — Ambulatory Visit (HOSPITAL_COMMUNITY): Payer: Medicare HMO | Attending: Cardiovascular Disease

## 2018-07-27 ENCOUNTER — Ambulatory Visit (INDEPENDENT_AMBULATORY_CARE_PROVIDER_SITE_OTHER)
Admission: RE | Admit: 2018-07-27 | Discharge: 2018-07-27 | Disposition: A | Discharge status: Home / Self Care | Payer: Medicare HMO | Source: Ambulatory Visit | Attending: Physician Assistant | Admitting: Physician Assistant

## 2018-07-27 DIAGNOSIS — R918 Other nonspecific abnormal finding of lung field: Secondary | ICD-10-CM | POA: Diagnosis not present

## 2018-07-27 DIAGNOSIS — I5042 Chronic combined systolic (congestive) and diastolic (congestive) heart failure: Secondary | ICD-10-CM | POA: Diagnosis not present

## 2018-07-27 DIAGNOSIS — R911 Solitary pulmonary nodule: Secondary | ICD-10-CM | POA: Diagnosis not present

## 2018-07-28 ENCOUNTER — Encounter: Payer: Self-pay | Admitting: Physician Assistant

## 2018-07-28 DIAGNOSIS — R911 Solitary pulmonary nodule: Secondary | ICD-10-CM | POA: Insufficient documentation

## 2018-07-31 NOTE — Progress Notes (Signed)
MEDICAL CLEARANCE NOTE DAVID TYSINGER PA 07-09-18 Epic LOV [facility] WEAVER PA CARDIOLOGY 07-22-18 Epic EKG 11-13-17 Epic ECHO 6-15 20 Epic CHEST CT 6-15 20 Epic HEMAGLOBIN A1C 07-15-18 Epic (6.7)

## 2018-07-31 NOTE — Patient Instructions (Addendum)
Darlene Wade     Your procedure is scheduled on: 08-10-2018   Report to [facility] Main  Entrance  Report to admitting at 715 AM    [place] [facility] TEST ON_Thuesday 6/25______ @_9 :35______, THIS TEST MUST BE DONE BEFORE SURGERY, COME TO [place]. ONCE YOUR COVID TEST IS COMPLETED, PLEASE BEGIN THE QUARANTINE INSTRUCTIONS AS OUTLINED IN YOUR HANDOUT.   Call this number if you have problems the morning of surgery [phone]    Remember:  [place], NO CHEWING GUM CANDY OR MINTS.   NO SOLID FOOD AFTER MIDNIGHT THE NIGHT PRIOR TO SURGERY. NOTHING BY MOUTH EXCEPT CLEAR LIQUIDS UNTIL 645 AM. PLEASE FINISH G2 DRINK PER SURGEON ORDER 3 HOURS PRIOR TO SCHEDULED SURGERY TIME WHICH NEEDS TO BE COMPLETED AT 645 AM.     CLEAR LIQUID DIET   Foods Allowed                                                                     Foods Excluded  Coffee and tea, regular and decaf                             liquids that you cannot  Plain Jell-O in any flavor                                             see through such as: Fruit ices (not with fruit pulp)                                     milk, soups, orange juice  Iced Popsicles                                    All solid food Carbonated beverages, regular and diet                                    Cranberry, grape and apple juices Sports drinks like Gatorade Lightly seasoned clear broth or consume(fat free) Sugar, honey syrup  Sample Menu Breakfast                                Lunch                                     Supper Cranberry juice                    Beef broth  Chicken broth Jell-O                                     Grape juice                           Apple juice Coffee or tea                        Jell-O                                      Popsicle                                                 Coffee or tea                        Coffee or tea  _____________________________________________________________________    Take these medicines the morning of surgery with A SIP OF WATER: BREO ELLIPTA INHALER, ATORVASTATIN (LIPITOR), CARVEDILOL (COREG)  DO NOT TAKE ANY DIABETIC MEDICATIONS DAY OF YOUR SURGERY              How to Manage Your Diabetes Before and After Surgery  Why is it important to control my blood sugar before and after surgery? . Improving blood sugar levels before and after surgery helps healing and can limit problems. . A way of improving blood sugar control is eating a healthy diet by: o  Eating less sugar and carbohydrates o  Increasing activity/exercise o  Talking with your doctor about reaching your blood sugar goals . High blood sugars (greater than 180 mg/dL) can raise your risk of infections and slow your recovery, so you will need to focus on controlling your diabetes during the weeks before surgery. . Make sure that the doctor who takes care of your diabetes knows about your planned surgery including the date and location.  How do I manage my blood sugar before surgery? . Check your blood sugar at least 4 times a day, starting 2 days before surgery, to make sure that the level is not too high or low. o Check your blood sugar the morning of your surgery when you wake up and every 2 hours until you get to the Short Stay unit. . If your blood sugar is less than 70 mg/dL, you will need to treat for low blood sugar: o Do not take insulin. o Treat a low blood sugar (less than 70 mg/dL) with  cup of clear juice (cranberry or apple), 4 glucose tablets, OR glucose gel. o Recheck blood sugar in 15 minutes after treatment (to make sure it is greater than 70 mg/dL). If your blood sugar is not greater than 70 mg/dL on recheck, call [phone] for further instructions. . Report your blood sugar to the short stay nurse when you get to Short  Stay.  . If you are admitted to the hospital after surgery: o Your blood sugar will be checked by the staff and you will probably be given insulin after surgery (instead of oral diabetes medicines) to make sure you have good blood sugar levels. o The  goal for blood sugar control after surgery is 80-180 mg/dL.   WHAT DO I DO ABOUT MY DIABETES MEDICATION?  Marland Kitchen Do not take oral diabetes medicines (pills) the morning of surgery.  . THE  DAY BEFORE SURGERY HOLD JANUVIA .  Marland Kitchen TAKE METFORMIN AS USUAL DAY BEFORE SURGE      . THE MORNING OF SURGERY DO NOT TAKE METFORMIN OR JANUVIA   Reviewed and Endorsed by [facility] Patient Education Committee, August 2015                   You may not have any metal on your body including hair pins and              piercings             Do not wear jewelry, make-up, lotions, powders or perfumes, deodorant             Do not wear nail polish.  Do not shave  48 hours prior to surgery.           Do not bring valuables to the hospital. [place].  Contacts, dentures or bridgework may not be worn into surgery.     _____________________________________________________________________             [facility] - Preparing for Surgery Before surgery, you can play an important role.  Because skin is not sterile, your skin needs to be as free of germs as possible.  You can reduce the number of germs on your skin by washing with CHG (chlorahexidine gluconate) soap before surgery.  CHG is an antiseptic cleaner which kills germs and bonds with the skin to continue killing germs even after washing. Please DO NOT use if you have an allergy to CHG or antibacterial soaps.  If your skin becomes reddened/irritated stop using the CHG and inform your nurse when you arrive at Short Stay. Do not shave (including legs and underarms) for at least 48 hours prior to the first CHG shower.  You may shave your face/neck. Please follow  these instructions carefully:  1.  Shower with CHG Soap the night before surgery and the  morning of Surgery.  2.  If you choose to wash your hair, wash your hair first as usual with your  normal  shampoo.  3.  After you shampoo, rinse your hair and body thoroughly to remove the  shampoo.                                        4.  Use CHG as you would any other liquid soap.  You can apply chg directly  to the skin and wash                       Gently with a scrungie or clean washcloth.  5.  Apply the CHG Soap to your body ONLY FROM THE NECK DOWN.   Do not use on face/ open                           Wound or open sores. Avoid contact with eyes, ears mouth and genitals (private parts).  Wash face,  Genitals (private parts) with your normal soap.             6.  Wash thoroughly, paying special attention to the area where your surgery  will be performed.  7.  Thoroughly rinse your body with warm water from the neck down.  8.  DO NOT shower/wash with your normal soap after using and rinsing off  the CHG Soap.                9.  Pat yourself dry with a clean towel.            10.  Wear clean pajamas.            11.  Place clean sheets on your bed the night of your first shower and do not  sleep with pets. Day of Surgery : Do not apply any lotions/deodorants the morning of surgery.  Please wear clean clothes to the hospital/surgery center.  FAILURE TO FOLLOW THESE INSTRUCTIONS MAY RESULT IN THE CANCELLATION OF YOUR SURGERY PATIENT SIGNATURE_________________________________  NURSE SIGNATURE__________________________________  ________________________________________________________________________   Adam Phenix  An incentive spirometer is a tool that can help keep your lungs clear and active. This tool measures how well you are filling your lungs with each breath. Taking long deep breaths may help reverse or decrease the chance of developing breathing (pulmonary)  problems (especially infection) following:  A long period of time when you are unable to move or be active. BEFORE THE PROCEDURE   If the spirometer includes an indicator to show your best effort, your nurse or respiratory therapist will set it to a desired goal.  If possible, sit up straight or lean slightly forward. Try not to slouch.  Hold the incentive spirometer in an upright position. INSTRUCTIONS FOR USE  1. Sit on the edge of your bed if possible, or sit up as far as you can in bed or on a chair. 2. Hold the incentive spirometer in an upright position. 3. Breathe out normally. 4. Place the mouthpiece in your mouth and seal your lips tightly around it. 5. Breathe in slowly and as deeply as possible, raising the piston or the ball toward the top of the column. 6. Hold your breath for 3-5 seconds or for as long as possible. Allow the piston or ball to fall to the bottom of the column. 7. Remove the mouthpiece from your mouth and breathe out normally. 8. Rest for a few seconds and repeat Steps 1 through 7 at least 10 times every 1-2 hours when you are awake. Take your time and take a few normal breaths between deep breaths. 9. The spirometer may include an indicator to show your best effort. Use the indicator as a goal to work toward during each repetition. 10. After each set of 10 deep breaths, practice coughing to be sure your lungs are clear. If you have an incision (the cut made at the time of surgery), support your incision when coughing by placing a pillow or rolled up towels firmly against it. Once you are able to get out of bed, walk around indoors and cough well. You may stop using the incentive spirometer when instructed by your caregiver.  RISKS AND COMPLICATIONS  Take your time so you do not get dizzy or light-headed.  If you are in pain, you may need to take or ask for pain medication before doing incentive spirometry. It is harder to take a deep breath if you are having  pain. AFTER USE  Rest and breathe slowly and easily.  It can be helpful to keep track of a log of your progress. Your caregiver can provide you with a simple table to help with this. If you are using the spirometer at home, follow these instructions: [place] IF:   You are having difficultly using the spirometer.  You have trouble using the spirometer as often as instructed.  Your pain medication is not giving enough relief while using the spirometer.  You develop fever of 100.5 F (38.1 C) or higher. SEEK IMMEDIATE MEDICAL CARE IF:   You cough up bloody sputum that had not been present before.  You develop fever of 102 F (38.9 C) or greater.  You develop worsening pain at or near the incision site. MAKE SURE YOU:   Understand these instructions.  Will watch your condition.  Will get help right away if you are not doing well or get worse. Document Released: 06/10/2006 Document Revised: 04/22/2011 Document Reviewed: 08/11/2006 ExitCare Patient Information 2014 Exit[employer], [place].   ________________________________________________________________________  WHAT IS A BLOOD TRANSFUSION? Blood Transfusion Information  A transfusion is the replacement of blood or some of its parts. Blood is made up of multiple cells which provide different functions.  Red blood cells carry oxygen and are used for blood loss replacement.  White blood cells fight against infection.  Platelets control bleeding.  Plasma helps clot blood.  Other blood products are available for specialized needs, such as hemophilia or other clotting disorders. BEFORE THE TRANSFUSION  Who gives blood for transfusions?   Healthy volunteers who are fully evaluated to make sure their blood is safe. This is blood bank blood. Transfusion therapy is the safest it has ever been in the practice of medicine. Before blood is taken from a donor, a complete history is taken to make sure that person has no history  of diseases nor engages in risky social behavior (examples are intravenous drug use or sexual activity with multiple partners). The donor's travel history is screened to minimize risk of transmitting infections, such as malaria. The donated blood is tested for signs of infectious diseases, such as HIV and hepatitis. The blood is then tested to be sure it is compatible with you in order to minimize the chance of a transfusion reaction. If you or a relative donates blood, this is often done in anticipation of surgery and is not appropriate for emergency situations. It takes many days to process the donated blood. RISKS AND COMPLICATIONS Although transfusion therapy is very safe and saves many lives, the main dangers of transfusion include:   Getting an infectious disease.  Developing a transfusion reaction. This is an allergic reaction to something in the blood you were given. Every precaution is taken to prevent this. The decision to have a blood transfusion has been considered carefully by your caregiver before blood is given. Blood is not given unless the benefits outweigh the risks. AFTER THE TRANSFUSION  Right after receiving a blood transfusion, you will usually feel much better and more energetic. This is especially true if your red blood cells have gotten low (anemic). The transfusion raises the level of the red blood cells which carry oxygen, and this usually causes an energy increase.  The nurse administering the transfusion will monitor you carefully for complications. HOME CARE INSTRUCTIONS  No special instructions are needed after a transfusion. You may find your energy is better. Speak with your caregiver about any limitations on activity for underlying diseases  you may have. SEEK MEDICAL CARE IF:   Your condition is not improving after your transfusion.  You develop redness or irritation at the intravenous (IV) site. SEEK IMMEDIATE MEDICAL CARE IF:  Any of the following symptoms  occur over the next 12 hours:  Shaking chills.  You have a temperature by mouth above 102 F (38.9 C), not controlled by medicine.  Chest, back, or muscle pain.  People around you feel you are not acting correctly or are confused.  Shortness of breath or difficulty breathing.  Dizziness and fainting.  You get a rash or develop hives.  You have a decrease in urine output.  Your urine turns a dark color or changes to pink, red, or brown. Any of the following symptoms occur over the next 10 days:  You have a temperature by mouth above 102 F (38.9 C), not controlled by medicine.  Shortness of breath.  Weakness after normal activity.  The white part of the eye turns yellow (jaundice).  You have a decrease in the amount of urine or are urinating less often.  Your urine turns a dark color or changes to pink, red, or brown. Document Released: 01/26/2000 Document Revised: 04/22/2011 Document Reviewed: 09/14/2007 [facility] Patient Information 2014 [place], [place].  _______________________________________________________________________

## 2018-08-03 ENCOUNTER — Encounter (HOSPITAL_COMMUNITY)
Admission: RE | Admit: 2018-08-03 | Discharge: 2018-08-03 | Disposition: A | Discharge status: Home / Self Care | Payer: Medicare HMO | Source: Ambulatory Visit | Attending: Orthopedic Surgery | Admitting: Orthopedic Surgery

## 2018-08-03 ENCOUNTER — Encounter (HOSPITAL_COMMUNITY): Payer: Self-pay

## 2018-08-03 ENCOUNTER — Other Ambulatory Visit: Payer: Self-pay

## 2018-08-03 DIAGNOSIS — E785 Hyperlipidemia, unspecified: Secondary | ICD-10-CM | POA: Insufficient documentation

## 2018-08-03 DIAGNOSIS — Z79899 Other long term (current) drug therapy: Secondary | ICD-10-CM | POA: Diagnosis not present

## 2018-08-03 DIAGNOSIS — J45909 Unspecified asthma, uncomplicated: Secondary | ICD-10-CM | POA: Diagnosis not present

## 2018-08-03 DIAGNOSIS — Z7951 Long term (current) use of inhaled steroids: Secondary | ICD-10-CM | POA: Diagnosis not present

## 2018-08-03 DIAGNOSIS — E669 Obesity, unspecified: Secondary | ICD-10-CM | POA: Diagnosis not present

## 2018-08-03 DIAGNOSIS — M1712 Unilateral primary osteoarthritis, left knee: Secondary | ICD-10-CM | POA: Insufficient documentation

## 2018-08-03 DIAGNOSIS — Z1159 Encounter for screening for other viral diseases: Secondary | ICD-10-CM | POA: Diagnosis not present

## 2018-08-03 DIAGNOSIS — I5040 Unspecified combined systolic (congestive) and diastolic (congestive) heart failure: Secondary | ICD-10-CM | POA: Diagnosis not present

## 2018-08-03 DIAGNOSIS — Z6838 Body mass index (BMI) 38.0-38.9, adult: Secondary | ICD-10-CM | POA: Insufficient documentation

## 2018-08-03 DIAGNOSIS — Z01818 Encounter for other preprocedural examination: Secondary | ICD-10-CM | POA: Insufficient documentation

## 2018-08-03 DIAGNOSIS — I110 Hypertensive heart disease with heart failure: Secondary | ICD-10-CM | POA: Diagnosis not present

## 2018-08-03 DIAGNOSIS — E119 Type 2 diabetes mellitus without complications: Secondary | ICD-10-CM | POA: Insufficient documentation

## 2018-08-03 DIAGNOSIS — Z87891 Personal history of nicotine dependence: Secondary | ICD-10-CM | POA: Diagnosis not present

## 2018-08-03 DIAGNOSIS — Z7982 Long term (current) use of aspirin: Secondary | ICD-10-CM | POA: Diagnosis not present

## 2018-08-03 DIAGNOSIS — Z7984 Long term (current) use of oral hypoglycemic drugs: Secondary | ICD-10-CM | POA: Insufficient documentation

## 2018-08-03 HISTORY — DX: Dyspnea, unspecified: R06.00

## 2018-08-03 LAB — CBC WITH DIFFERENTIAL/PLATELET
Abs Immature Granulocytes: 0.02 10*3/uL (ref 0.00–0.07)
Basophils Absolute: 0.1 10*3/uL (ref 0.0–0.1)
Basophils Relative: 1 %
Eosinophils Absolute: 0.2 10*3/uL (ref 0.0–0.5)
Eosinophils Relative: 3 %
HCT: 41.7 % (ref 36.0–46.0)
Hemoglobin: 12.9 g/dL (ref 12.0–15.0)
Immature Granulocytes: 0 %
Lymphocytes Relative: 29 %
Lymphs Abs: 2 10*3/uL (ref 0.7–4.0)
MCH: 28 pg (ref 26.0–34.0)
MCHC: 30.9 g/dL (ref 30.0–36.0)
MCV: 90.7 fL (ref 80.0–100.0)
Monocytes Absolute: 0.5 10*3/uL (ref 0.1–1.0)
Monocytes Relative: 7 %
Neutro Abs: 4.3 10*3/uL (ref 1.7–7.7)
Neutrophils Relative %: 60 %
Platelets: 277 10*3/uL (ref 150–400)
RBC: 4.6 MIL/uL (ref 3.87–5.11)
RDW: 15.9 % — ABNORMAL HIGH (ref 11.5–15.5)
WBC: 7 10*3/uL (ref 4.0–10.5)
nRBC: 0 % (ref 0.0–0.2)

## 2018-08-03 LAB — BASIC METABOLIC PANEL
Anion gap: 10 (ref 5–15)
BUN: 18 mg/dL (ref 8–23)
CO2: 25 mmol/L (ref 22–32)
Calcium: 9.8 mg/dL (ref 8.9–10.3)
Chloride: 104 mmol/L (ref 98–111)
Creatinine, Ser: 0.86 mg/dL (ref 0.44–1.00)
GFR calc Af Amer: >60 mL/min (ref >60)
GFR calc non Af Amer: >60 mL/min (ref >60)
Glucose, Bld: 123 mg/dL — ABNORMAL HIGH (ref 70–99)
Potassium: 4.1 mmol/L (ref 3.5–5.1)
Sodium: 139 mmol/L (ref 135–145)

## 2018-08-03 LAB — URINALYSIS, ROUTINE W REFLEX MICROSCOPIC
Bilirubin Urine: NEGATIVE
Glucose, UA: >=500 mg/dL — AB
Hgb urine dipstick: NEGATIVE
Ketones, ur: NEGATIVE mg/dL
Leukocytes,Ua: NEGATIVE
Nitrite: NEGATIVE
Protein, ur: NEGATIVE mg/dL
Specific Gravity, Urine: 1.022 (ref 1.005–1.030)
pH: 5 (ref 5.0–8.0)

## 2018-08-03 LAB — PROTIME-INR
INR: 1 (ref 0.8–1.2)
Prothrombin Time: 12.9 seconds (ref 11.4–15.2)

## 2018-08-03 LAB — ABO/RH: ABO/RH(D): O POS

## 2018-08-03 LAB — GLUCOSE, CAPILLARY: Glucose-Capillary: 127 mg/dL — ABNORMAL HIGH (ref 70–99)

## 2018-08-03 LAB — SURGICAL PCR SCREEN
MRSA, PCR: NEGATIVE
Staphylococcus aureus: NEGATIVE

## 2018-08-03 LAB — APTT: aPTT: 39 seconds — ABNORMAL HIGH (ref 24–36)

## 2018-08-03 NOTE — Progress Notes (Signed)
Konrad Felix PA  Patient has stopped her ASA 07/13/18 per Dr. Madelyn Brunner office who also prescribes it for her

## 2018-08-05 NOTE — Anesthesia Preprocedure Evaluation (Addendum)
Anesthesia Evaluation  Patient identified by MRN, date of birth, ID band Patient awake    Reviewed: Allergy & Precautions, NPO status , Patient's Chart, lab work & pertinent test results, reviewed documented beta blocker date and time   History of Anesthesia Complications (+) PONVNegative for: history of anesthetic complications  Airway Mallampati: II  TM Distance: >3 FB Neck ROM: Full    Dental  (+) Teeth Intact   Pulmonary asthma , former smoker,    Pulmonary exam normal        Cardiovascular hypertension, Pt. on medications and Pt. on home beta blockers +CHF (EF 40-45%)  (-) CAD Normal cardiovascular exam     Neuro/Psych negative neurological ROS  negative psych ROS   GI/Hepatic negative GI ROS, Neg liver ROS,   Endo/Other  diabetes, Type 2, Oral Hypoglycemic Agents  Renal/GU negative Renal ROS  negative genitourinary   Musculoskeletal  (+) Arthritis ,   Abdominal   Peds  Hematology negative hematology ROS (+)   Anesthesia Other Findings Pt cleared by cardiologist, Dr. Mertie Moores.  Per telephone encounter 07/09/2018, "Ms Avilla is stable for knee surgery.  She has mildly reduced LV function but has no CAD by coronary CT angio.  She should be at low risk for her surgery."  Reproductive/Obstetrics                           Anesthesia Physical Anesthesia Plan  ASA: III  Anesthesia Plan: Spinal   Post-op Pain Management:  Regional for Post-op pain   Induction: Intravenous  PONV Risk Score and Plan: 3 and Ondansetron, Propofol infusion and Treatment may vary due to age or medical condition  Airway Management Planned: Natural Airway, Nasal Cannula and Simple Face Mask  Additional Equipment: None  Intra-op Plan:   Post-operative Plan:   Informed Consent: I have reviewed the patients History and Physical, chart, labs and discussed the procedure including the risks, benefits and  alternatives for the proposed anesthesia with the patient or authorized representative who has indicated his/her understanding and acceptance.       Plan Discussed with:   Anesthesia Plan Comments: (See PAT note 08/03/2018, Konrad Felix, PA-C)       Anesthesia Quick Evaluation

## 2018-08-05 NOTE — H&P (Signed)
TOTAL KNEE ADMISSION H&P  Patient is being admitted for left total knee arthroplasty.  Subjective:  Chief Complaint:left knee pain.  HPI: Darlene Wade, 63 y.o. female, has a history of pain and functional disability in the left knee due to arthritis and has failed non-surgical conservative treatments for greater than 12 weeks to includeNSAID's and/or analgesics, corticosteriod injections, viscosupplementation injections, use of assistive devices, weight reduction as appropriate and activity modification.  Onset of symptoms was gradual, starting several years ago with gradually worsening course since that time. The patient noted no past surgery on the left knee(s).  Patient currently rates pain in the left knee(s) at 10 out of 10 with activity. Patient has night pain, worsening of pain with activity and weight bearing, pain that interferes with activities of daily living, pain with passive range of motion, crepitus and joint swelling.  Patient has evidence of joint subluxation and joint space narrowing by imaging studies.  There is no active infection.  Patient Active Problem List   Diagnosis Date Noted  . Lung nodule   . Puncture wound 07/15/2018  . Preop examination 07/15/2018  . Need for pneumococcal vaccination 03/03/2018  . Chronic combined systolic and diastolic heart failure ([place]) 03/03/2018  . Mild intermittent asthma without complication 69/48/5462  . Degenerative arthritis of knee, bilateral 11/11/2016  . Vitamin D deficiency 05/22/2016  . Estrogen deficiency 05/22/2016  . History of fracture 05/22/2016  . Atrophic vaginitis 05/22/2016  . Morbid obesity ([place]) 07/27/2015  . Diabetes mellitus with complication ([place]) 70/35/0093  . Nutritional anemia, unspecified 07/27/2015  . Screening for heart disease 07/27/2015  . Essential hypertension, benign 09/21/2014  . Hyperlipidemia 09/21/2014  . Need for influenza vaccination 09/21/2014  . Vaccine counseling 09/21/2014  . Intraductal  papilloma of left breast 10/27/2012  . Breast mass in female - left inner lower quadrant 09/24/2012  . Osteoarthritis of foot 12/10/2010   Past Medical History:  Diagnosis Date  . Asthma 07/20/2018   one puff per day  . Chronic combined systolic and diastolic heart failure ([place]) 03/03/2018   Echo 07/2018: EF 40-45, diff HK worse in Inf base, normal RVSF  . Diabetes mellitus without complication ([place]) 8182  . Diabetic eye exam [facility])    [employer]  . Dyspnea   . Elbow fracture, right 2007  . Former smoker    20 pack year history, quit 2010  . Hyperlipidemia   . Hypertension   . Insomnia   . Lung nodule    Chest CT 07/2018:  RLL nodule resolved.  3 mm subpleural LUL nodule.  Repeat in 1 year if high risk.   . Obesity   . PONV (postoperative nausea and vomiting) 2014   1 time  . Wears glasses    reading    Past Surgical History:  Procedure Laterality Date  . BREAST CYST ASPIRATION  2015  . BREAST EXCISIONAL BIOPSY Left   . BREAST LUMPECTOMY WITH NEEDLE LOCALIZATION Left 10/07/2012   Procedure: BREAST LUMPECTOMY WITH NEEDLE LOCALIZATION;  Surgeon: Imogene Burn. Georgette Dover, MD;  Location: [facility];  Service: General;  Laterality: Left;  . BREAST SURGERY    . COLONOSCOPY  01/2016   01/2016 with Dr. Loletha Carrow, tubular adenoma polpy; 2003 with Dr. Collene Mares  . ELBOW ARTHROPLASTY  2006   rt-fx  . FOOT ARTHROTOMY  11/12   foot fusion, right  . FOOT MASS EXCISION  3/14   left-fusion  . PARTIAL HYSTERECTOMY  age 44   uterine fibroids, still has  ovaries  . TONSILLECTOMY      Current Facility-Administered Medications  Medication Dose Route Frequency Provider Last Rate Last Dose  . 0.9 %  sodium chloride infusion  500 mL Intravenous Continuous Doran Stabler, MD       Current Outpatient Medications  Medication Sig Dispense Refill Last Dose  . atorvastatin (LIPITOR) 40 MG tablet TAKE 1 TABLET EVERY DAY (Patient taking differently: Take 20 mg by mouth daily. ) 90 tablet 1   .  carvedilol (COREG) 3.125 MG tablet Take 1 tablet (3.125 mg total) by mouth 2 (two) times daily. 180 tablet 3   . cholecalciferol (VITAMIN D3) 25 MCG (1000 UT) tablet Take 2 tablets (2,000 Units total) by mouth daily. 180 tablet 3   . empagliflozin (JARDIANCE) 10 MG TABS tablet Take 10 mg by mouth daily. 90 tablet 3   . fluticasone furoate-vilanterol (BREO ELLIPTA) 100-25 MCG/INH AEPB Inhale 1 puff into the lungs daily. 1 each 0   . losartan (COZAAR) 50 MG tablet Take 1 tablet (50 mg total) by mouth daily. 90 tablet 3   . metFORMIN (GLUCOPHAGE) 1000 MG tablet Take 1 tablet (1,000 mg total) by mouth 2 (two) times daily with a meal. (Patient taking differently: Take 1,000 mg by mouth daily with breakfast. ) 180 tablet 3   . Multiple Vitamin (MULTIVITAMIN) tablet Take 1 tablet by mouth daily.     . traMADol (ULTRAM) 50 MG tablet Take 1 tablet (50 mg total) by mouth 2 (two) times daily as needed. 20 tablet 0   . aspirin EC 81 MG tablet Take 1 tablet (81 mg total) by mouth daily. (Patient not taking: Reported on 07/28/2018) 90 tablet 3 Not Taking at Unknown time  . Blood Glucose Monitoring Suppl (ACCU-CHEK AVIVA PLUS) w/Device KIT Test blood sugar 2 times daily 1 kit 0    Allergies  Allergen Reactions  . Oxycodone Nausea And Vomiting  . Naproxen Nausea Only  . Vicodin [Hydrocodone-Acetaminophen] Nausea Only    Social History   Tobacco Use  . Smoking status: Former Smoker    Packs/day: 1.00    Years: 20.00    Pack years: 20.00    Quit date: 04/11/2009    Years since quitting: 9.3  . Smokeless tobacco: Never Used  Substance Use Topics  . Alcohol use: Yes    Alcohol/week: 1.0 standard drinks    Types: 1 Glasses of wine per week    Comment: rare    Family History  Problem Relation Age of Onset  . Diabetes Mother   . Hypertension Mother   . Diabetes Maternal Aunt   . Heart disease Maternal Grandfather   . Cancer Neg Hx      Review of Systems  Constitutional: Positive for weight loss.   HENT: Negative.   Eyes: Negative.   Respiratory: Negative.   Cardiovascular: Negative.   Gastrointestinal: Negative.   Genitourinary: Negative.   Musculoskeletal: Positive for joint pain.  Skin: Negative.   Neurological: Negative.   Endo/Heme/Allergies: Negative.   Psychiatric/Behavioral: Negative.     Objective:  Physical Exam  Constitutional: She appears well-developed and well-nourished.  HENT:  Head: Normocephalic and atraumatic.  Eyes: Pupils are equal, round, and reactive to light.  Neck: Normal range of motion. Neck supple.  Cardiovascular: Intact distal pulses.  Respiratory: Effort normal.  Musculoskeletal:        General: Tenderness present.     Comments: Bilateral varus deformities of the knees to observation walks with a left greater than right  antalgic gait.  Tender along the medial joint lines 5 flexion contractures bilaterally both knees flex to 115/120.  Neurovascularly intact distally.  Toes are pink and well perfused.    Skin: Skin is warm and dry.  Psychiatric: She has a normal mood and affect. Her behavior is normal. Judgment and thought content normal.    Vital signs in last 24 hours:    Labs:   Estimated body mass index is 38.9 kg/m as calculated from the following:   Height as of 08/03/18: '5\' 3"'  (1.6 m).   Weight as of 08/03/18: 99.6 kg.   Imaging Review Plain radiographs demonstrate  bone-on-bone arthritis with early erosion, right greater than left knee medially lateral translation of tibias about 8-9 mm.  Assessment/Plan:  End stage arthritis, left knee   The patient history, physical examination, clinical judgment of the provider and imaging studies are consistent with end stage degenerative joint disease of the left knee(s) and total knee arthroplasty is deemed medically necessary. The treatment options including medical management, injection therapy arthroscopy and arthroplasty were discussed at length. The risks and benefits of total  knee arthroplasty were presented and reviewed. The risks due to aseptic loosening, infection, stiffness, patella tracking problems, thromboembolic complications and other imponderables were discussed. The patient acknowledged the explanation, agreed to proceed with the plan and consent was signed. Patient is being admitted for inpatient treatment for surgery, pain control, PT, OT, prophylactic antibiotics, VTE prophylaxis, progressive ambulation and ADL's and discharge planning. The patient is planning to be discharged home with home health services     Patient's anticipated LOS is less than 2 midnights, meeting these requirements: - Younger than 34 - Lives within 1 hour of care - Has a competent adult at home to recover with post-op recover - NO history of  - Chronic pain requiring opiods  - Diabetes  - Coronary Artery Disease  - Heart failure  - Heart attack  - Stroke  - DVT/VTE  - Cardiac arrhythmia  - Respiratory Failure/COPD  - Renal failure  - Anemia  - Advanced Liver disease

## 2018-08-05 NOTE — Progress Notes (Signed)
Anesthesia Chart Review   Case: [id number] Date/Time: 08/10/18 0932   Procedure: Left Knee Arthroplasty (Left )   Anesthesia type: Spinal   Pre-op diagnosis: Left Knee Osteoarthritis   Location: [place] 08 / [facility]   Surgeon: Frederik Pear, MD      DISCUSSION: 63 yo former smoker (20 pack years, quit 04/11/09) with h/o PONV, HLD, combined systolic and diastolic HF, asthma, HTN, DM II, left knee OA scheduled for above procedure 08/10/18 with Dr. Frederik Pear.   Pt cleared by cardiologist, Dr. Mertie Moores.  Per telephone encounter 07/09/2018, "Ms Larranaga is stable for knee surgery.  She has mildly reduced LV function but has no CAD by coronary CT angio.  She should be at low risk for her surgery."  Anticipate pt can proceed with planned procedure barring acute status change.   VS: BP 122/72   Pulse 85   Temp 36.9 C (Oral)   Resp 18   Ht _0  (1.6 m)   Wt 99.6 kg   SpO2 100%   BMI 38.90 kg/m   PROVIDERS: Tysinger, Camelia Eng, PA-C is PCP   Mertie Moores, MD is Cardiologist  LABS: Labs reviewed: Acceptable for surgery. (all labs ordered are listed, but only abnormal results are displayed)  Labs Reviewed  APTT - Abnormal; Notable for the following components:      Result Value   aPTT 39 (*)    All other components within normal limits  BASIC METABOLIC PANEL - Abnormal; Notable for the following components:   Glucose, Bld 123 (*)    All other components within normal limits  CBC WITH DIFFERENTIAL/PLATELET - Abnormal; Notable for the following components:   RDW 15.9 (*)    All other components within normal limits  URINALYSIS, ROUTINE W REFLEX MICROSCOPIC - Abnormal; Notable for the following components:   Glucose, UA >=500 (*)    Bacteria, UA RARE (*)    All other components within normal limits  GLUCOSE, CAPILLARY - Abnormal; Notable for the following components:   Glucose-Capillary 127 (*)    All other components within normal limits  SURGICAL PCR SCREEN  PROTIME-INR  TYPE  AND SCREEN  ABO/RH     IMAGES: CT Chest 07/27/18 IMPRESSION: A previously noted right lower lobe ground-glass pulmonary nodule is resolved, consistent with resolution of infection or inflammation. Unchanged 3 mm subpleural nodule of the left upper lobe (series 3, image 58). Further follow-up of the latter nodule is optional at 12 months if there are high risk factors for lung cancer. Consider annual CT lung cancer screening if indicated by patient age, smoking history, and/or other risk factors for lung cancer.  EKG:  11/17/17 Rate 82 bpm Normal sinus rhythm  Possible left atrial enlargement   CV: Echo 07/27/2018  IMPRESSIONS    1. The left ventricle has mild-moderately reduced systolic function, with an ejection fraction of 40-45%. The cavity size was mildly dilated. Left ventricular diastolic parameters were normal.  2. Diffuse hypokinesis worse in the inferior base Abnormal septal motion.  3. The right ventricle has normal systolic function. The cavity was normal. There is no increase in right ventricular wall thickness.  4. Mild thickening of the mitral valve leaflet.  5. The aortic valve is tricuspid. Mild thickening of the aortic valve. Mild calcification of the aortic valve. Past Medical History:  Diagnosis Date  . Asthma 07/20/2018   one puff per day  . Chronic combined systolic and diastolic heart failure ([place]) 03/03/2018   Echo 07/2018: EF  40-45, diff HK worse in Inf base, normal RVSF  . Diabetes mellitus without complication ([place]) 8902  . Diabetic eye exam [facility])    [employer]  . Dyspnea   . Elbow fracture, right 2007  . Former smoker    20 pack year history, quit 2010  . Hyperlipidemia   . Hypertension   . Insomnia   . Lung nodule    Chest CT 07/2018:  RLL nodule resolved.  3 mm subpleural LUL nodule.  Repeat in 1 year if high risk.   . Obesity   . PONV (postoperative nausea and vomiting) 2014   1 time  . Wears glasses    reading    Past Surgical  History:  Procedure Laterality Date  . BREAST CYST ASPIRATION  2015  . BREAST EXCISIONAL BIOPSY Left   . BREAST LUMPECTOMY WITH NEEDLE LOCALIZATION Left 10/07/2012   Procedure: BREAST LUMPECTOMY WITH NEEDLE LOCALIZATION;  Surgeon: Imogene Burn. Georgette Dover, MD;  Location: [facility];  Service: General;  Laterality: Left;  . BREAST SURGERY    . COLONOSCOPY  01/2016   01/2016 with Dr. Loletha Carrow, tubular adenoma polpy; 2003 with Dr. Collene Mares  . ELBOW ARTHROPLASTY  2006   rt-fx  . FOOT ARTHROTOMY  11/12   foot fusion, right  . FOOT MASS EXCISION  3/14   left-fusion  . PARTIAL HYSTERECTOMY  age 44   uterine fibroids, still has ovaries  . TONSILLECTOMY      MEDICATIONS: . aspirin EC 81 MG tablet  . atorvastatin (LIPITOR) 40 MG tablet  . Blood Glucose Monitoring Suppl (ACCU-CHEK AVIVA PLUS) w/Device KIT  . carvedilol (COREG) 3.125 MG tablet  . cholecalciferol (VITAMIN D3) 25 MCG (1000 UT) tablet  . empagliflozin (JARDIANCE) 10 MG TABS tablet  . fluticasone furoate-vilanterol (BREO ELLIPTA) 100-25 MCG/INH AEPB  . losartan (COZAAR) 50 MG tablet  . metFORMIN (GLUCOPHAGE) 1000 MG tablet  . Multiple Vitamin (MULTIVITAMIN) tablet  . traMADol (ULTRAM) 50 MG tablet   . 0.9 %  sodium chloride infusion    [facility] Pre-Surgical Testing [phone] 08/05/18  11:14 AM

## 2018-08-06 ENCOUNTER — Other Ambulatory Visit (HOSPITAL_COMMUNITY)
Admission: RE | Admit: 2018-08-06 | Discharge: 2018-08-06 | Disposition: A | Discharge status: Home / Self Care | Payer: Medicare HMO | Source: Ambulatory Visit | Attending: Orthopedic Surgery | Admitting: Orthopedic Surgery

## 2018-08-06 DIAGNOSIS — Z1159 Encounter for screening for other viral diseases: Secondary | ICD-10-CM | POA: Diagnosis not present

## 2018-08-06 DIAGNOSIS — Z01818 Encounter for other preprocedural examination: Secondary | ICD-10-CM | POA: Diagnosis not present

## 2018-08-06 DIAGNOSIS — I110 Hypertensive heart disease with heart failure: Secondary | ICD-10-CM | POA: Diagnosis not present

## 2018-08-06 DIAGNOSIS — I5040 Unspecified combined systolic (congestive) and diastolic (congestive) heart failure: Secondary | ICD-10-CM | POA: Diagnosis not present

## 2018-08-06 DIAGNOSIS — M1712 Unilateral primary osteoarthritis, left knee: Secondary | ICD-10-CM | POA: Diagnosis not present

## 2018-08-06 DIAGNOSIS — Z7984 Long term (current) use of oral hypoglycemic drugs: Secondary | ICD-10-CM | POA: Diagnosis not present

## 2018-08-06 DIAGNOSIS — Z79899 Other long term (current) drug therapy: Secondary | ICD-10-CM | POA: Diagnosis not present

## 2018-08-06 DIAGNOSIS — Z7982 Long term (current) use of aspirin: Secondary | ICD-10-CM | POA: Diagnosis not present

## 2018-08-06 DIAGNOSIS — Z87891 Personal history of nicotine dependence: Secondary | ICD-10-CM | POA: Diagnosis not present

## 2018-08-06 LAB — SARS CORONAVIRUS 2 (TAT 6-24 HRS): SARS Coronavirus 2: NEGATIVE

## 2018-08-09 MED ORDER — BUPIVACAINE LIPOSOME 1.3 % IJ SUSP
20.0000 mL | INTRAMUSCULAR | Status: AC
  Filled 2018-08-09: qty 20

## 2018-08-09 MED ORDER — TRANEXAMIC ACID 1000 MG/10ML IV SOLN
2000.0000 mg | INTRAVENOUS | Status: AC
  Filled 2018-08-09: qty 20

## 2018-08-10 ENCOUNTER — Encounter (HOSPITAL_COMMUNITY)
Admission: RE | Disposition: A | Discharge status: Home / Self Care | Payer: Self-pay | Source: Home / Self Care | Attending: Orthopedic Surgery

## 2018-08-10 ENCOUNTER — Ambulatory Visit (HOSPITAL_COMMUNITY): Payer: Medicare HMO | Admitting: Physician Assistant

## 2018-08-10 ENCOUNTER — Ambulatory Visit (HOSPITAL_COMMUNITY)
Admission: RE | Admit: 2018-08-10 | Discharge: 2018-08-11 | Disposition: A | Discharge status: Home / Self Care | Payer: Medicare HMO | Attending: Orthopedic Surgery | Admitting: Orthopedic Surgery

## 2018-08-10 ENCOUNTER — Other Ambulatory Visit: Payer: Self-pay

## 2018-08-10 ENCOUNTER — Ambulatory Visit (HOSPITAL_COMMUNITY): Payer: Medicare HMO | Admitting: Certified Registered Nurse Anesthetist

## 2018-08-10 ENCOUNTER — Encounter (HOSPITAL_COMMUNITY): Payer: Self-pay | Admitting: *Deleted

## 2018-08-10 DIAGNOSIS — Z8249 Family history of ischemic heart disease and other diseases of the circulatory system: Secondary | ICD-10-CM | POA: Insufficient documentation

## 2018-08-10 DIAGNOSIS — Z886 Allergy status to analgesic agent status: Secondary | ICD-10-CM | POA: Diagnosis not present

## 2018-08-10 DIAGNOSIS — E119 Type 2 diabetes mellitus without complications: Secondary | ICD-10-CM | POA: Diagnosis not present

## 2018-08-10 DIAGNOSIS — Z90710 Acquired absence of both cervix and uterus: Secondary | ICD-10-CM | POA: Diagnosis not present

## 2018-08-10 DIAGNOSIS — J45909 Unspecified asthma, uncomplicated: Secondary | ICD-10-CM | POA: Diagnosis not present

## 2018-08-10 DIAGNOSIS — I5042 Chronic combined systolic (congestive) and diastolic (congestive) heart failure: Secondary | ICD-10-CM | POA: Diagnosis not present

## 2018-08-10 DIAGNOSIS — G4700 Insomnia, unspecified: Secondary | ICD-10-CM | POA: Diagnosis not present

## 2018-08-10 DIAGNOSIS — I700 Atherosclerosis of aorta: Secondary | ICD-10-CM | POA: Diagnosis not present

## 2018-08-10 DIAGNOSIS — Z885 Allergy status to narcotic agent status: Secondary | ICD-10-CM | POA: Insufficient documentation

## 2018-08-10 DIAGNOSIS — I110 Hypertensive heart disease with heart failure: Secondary | ICD-10-CM | POA: Insufficient documentation

## 2018-08-10 DIAGNOSIS — Z6838 Body mass index (BMI) 38.0-38.9, adult: Secondary | ICD-10-CM | POA: Diagnosis not present

## 2018-08-10 DIAGNOSIS — D3502 Benign neoplasm of left adrenal gland: Secondary | ICD-10-CM | POA: Diagnosis not present

## 2018-08-10 DIAGNOSIS — Z833 Family history of diabetes mellitus: Secondary | ICD-10-CM | POA: Insufficient documentation

## 2018-08-10 DIAGNOSIS — E785 Hyperlipidemia, unspecified: Secondary | ICD-10-CM | POA: Diagnosis not present

## 2018-08-10 DIAGNOSIS — M1712 Unilateral primary osteoarthritis, left knee: Secondary | ICD-10-CM | POA: Diagnosis present

## 2018-08-10 DIAGNOSIS — Z79899 Other long term (current) drug therapy: Secondary | ICD-10-CM | POA: Insufficient documentation

## 2018-08-10 DIAGNOSIS — Z7984 Long term (current) use of oral hypoglycemic drugs: Secondary | ICD-10-CM | POA: Diagnosis not present

## 2018-08-10 DIAGNOSIS — Z7951 Long term (current) use of inhaled steroids: Secondary | ICD-10-CM | POA: Insufficient documentation

## 2018-08-10 DIAGNOSIS — J4520 Mild intermittent asthma, uncomplicated: Secondary | ICD-10-CM | POA: Diagnosis not present

## 2018-08-10 DIAGNOSIS — Z7982 Long term (current) use of aspirin: Secondary | ICD-10-CM | POA: Diagnosis not present

## 2018-08-10 DIAGNOSIS — Z87891 Personal history of nicotine dependence: Secondary | ICD-10-CM | POA: Insufficient documentation

## 2018-08-10 DIAGNOSIS — I509 Heart failure, unspecified: Secondary | ICD-10-CM | POA: Diagnosis not present

## 2018-08-10 DIAGNOSIS — Z96652 Presence of left artificial knee joint: Secondary | ICD-10-CM

## 2018-08-10 DIAGNOSIS — G8918 Other acute postprocedural pain: Secondary | ICD-10-CM | POA: Diagnosis not present

## 2018-08-10 HISTORY — PX: TOTAL KNEE ARTHROPLASTY: SHX125

## 2018-08-10 LAB — GLUCOSE, CAPILLARY
Glucose-Capillary: 115 mg/dL — ABNORMAL HIGH (ref 70–99)
Glucose-Capillary: 120 mg/dL — ABNORMAL HIGH (ref 70–99)
Glucose-Capillary: 185 mg/dL — ABNORMAL HIGH (ref 70–99)
Glucose-Capillary: 174 mg/dL — ABNORMAL HIGH (ref 70–99)

## 2018-08-10 LAB — TYPE AND SCREEN
ABO/RH(D): O POS
Antibody Screen: NEGATIVE

## 2018-08-10 SURGERY — ARTHROPLASTY, KNEE, TOTAL
Anesthesia: Spinal | Site: Knee | Laterality: Left

## 2018-08-10 MED ORDER — SODIUM CHLORIDE 0.9 % IV SOLN
INTRAVENOUS | Status: DC | PRN
  Administered 2018-08-10: 50 ug/min via INTRAVENOUS

## 2018-08-10 MED ORDER — ONDANSETRON HCL 4 MG/2ML IJ SOLN: 4.0000 mg | Freq: Once | INTRAMUSCULAR | Status: DC | PRN

## 2018-08-10 MED ORDER — BUPIVACAINE-EPINEPHRINE (PF) 0.25% -1:200000 IJ SOLN
INTRAMUSCULAR | Status: DC | PRN
  Administered 2018-08-10: 30 mL

## 2018-08-10 MED ORDER — GABAPENTIN 300 MG PO CAPS
300.0000 mg | ORAL_CAPSULE | Freq: Three times a day (TID) | ORAL | Status: DC
  Administered 2018-08-10 – 2018-08-11 (×3): 300 mg via ORAL
  Filled 2018-08-10 (×3): qty 1

## 2018-08-10 MED ORDER — PHENYLEPHRINE 40 MCG/ML (10ML) SYRINGE FOR IV PUSH (FOR BLOOD PRESSURE SUPPORT)
PREFILLED_SYRINGE | INTRAVENOUS | Status: DC | PRN
  Administered 2018-08-10 (×5): 80 ug via INTRAVENOUS

## 2018-08-10 MED ORDER — ONDANSETRON HCL 4 MG PO TABS: 4.0000 mg | ORAL_TABLET | Freq: Four times a day (QID) | ORAL | Status: DC | PRN

## 2018-08-10 MED ORDER — WATER FOR IRRIGATION, STERILE IR SOLN
Status: DC | PRN
  Administered 2018-08-10: 2000 mL

## 2018-08-10 MED ORDER — METOCLOPRAMIDE HCL 5 MG/ML IJ SOLN: 5.0000 mg | Freq: Three times a day (TID) | INTRAMUSCULAR | Status: DC | PRN

## 2018-08-10 MED ORDER — TRANEXAMIC ACID 1000 MG/10ML IV SOLN
INTRAVENOUS | Status: DC | PRN
  Administered 2018-08-10: 2000 mg via TOPICAL

## 2018-08-10 MED ORDER — BUPIVACAINE IN DEXTROSE 0.75-8.25 % IT SOLN
INTRATHECAL | Status: DC | PRN
  Administered 2018-08-10: 1.8 mL via INTRATHECAL

## 2018-08-10 MED ORDER — PROPOFOL 10 MG/ML IV BOLUS
INTRAVENOUS | Status: AC
  Filled 2018-08-10: qty 40

## 2018-08-10 MED ORDER — CARVEDILOL 3.125 MG PO TABS
3.1250 mg | ORAL_TABLET | Freq: Two times a day (BID) | ORAL | Status: DC
  Administered 2018-08-10 – 2018-08-11 (×2): 3.125 mg via ORAL
  Filled 2018-08-10 (×2): qty 1

## 2018-08-10 MED ORDER — ROPIVACAINE HCL 5 MG/ML IJ SOLN
INTRAMUSCULAR | Status: DC | PRN
  Administered 2018-08-10: 30 mL via PERINEURAL

## 2018-08-10 MED ORDER — ONDANSETRON HCL 4 MG/2ML IJ SOLN: 4.0000 mg | Freq: Four times a day (QID) | INTRAMUSCULAR | Status: DC | PRN

## 2018-08-10 MED ORDER — ACETAMINOPHEN 325 MG PO TABS: 325.0000 mg | ORAL_TABLET | Freq: Four times a day (QID) | ORAL | Status: DC | PRN

## 2018-08-10 MED ORDER — BUPIVACAINE LIPOSOME 1.3 % IJ SUSP
INTRAMUSCULAR | Status: DC | PRN
  Administered 2018-08-10: 20 mL

## 2018-08-10 MED ORDER — PHENOL 1.4 % MT LIQD: 1.0000 | OROMUCOSAL | Status: DC | PRN

## 2018-08-10 MED ORDER — CEFAZOLIN SODIUM-DEXTROSE 2-4 GM/100ML-% IV SOLN
2.0000 g | INTRAVENOUS | Status: AC
  Administered 2018-08-10: 10:00:00 2 g via INTRAVENOUS
  Filled 2018-08-10: qty 100

## 2018-08-10 MED ORDER — FENTANYL CITRATE (PF) 100 MCG/2ML IJ SOLN
INTRAMUSCULAR | Status: AC
  Administered 2018-08-10: 09:00:00 50 ug via INTRAVENOUS
  Filled 2018-08-10: qty 2

## 2018-08-10 MED ORDER — PROPOFOL 10 MG/ML IV BOLUS
INTRAVENOUS | Status: DC | PRN
  Administered 2018-08-10 (×3): 20 mg via INTRAVENOUS

## 2018-08-10 MED ORDER — FLEET ENEMA 7-19 GM/118ML RE ENEM: 1.0000 | ENEMA | Freq: Once | RECTAL | Status: DC | PRN

## 2018-08-10 MED ORDER — MIDAZOLAM HCL 2 MG/2ML IJ SOLN
1.0000 mg | INTRAMUSCULAR | Status: DC
  Administered 2018-08-10: 09:00:00 2 mg via INTRAVENOUS

## 2018-08-10 MED ORDER — LIDOCAINE HCL (CARDIAC) PF 100 MG/5ML IV SOSY
PREFILLED_SYRINGE | INTRAVENOUS | Status: DC | PRN
  Administered 2018-08-10: 80 mg via INTRAVENOUS

## 2018-08-10 MED ORDER — LOSARTAN POTASSIUM 50 MG PO TABS
50.0000 mg | ORAL_TABLET | Freq: Every day | ORAL | Status: DC
  Administered 2018-08-11: 09:00:00 50 mg via ORAL
  Filled 2018-08-10: qty 1

## 2018-08-10 MED ORDER — PHENYLEPHRINE 40 MCG/ML (10ML) SYRINGE FOR IV PUSH (FOR BLOOD PRESSURE SUPPORT)
PREFILLED_SYRINGE | INTRAVENOUS | Status: AC
  Filled 2018-08-10: qty 10

## 2018-08-10 MED ORDER — ALUM & MAG HYDROXIDE-SIMETH 200-200-20 MG/5ML PO SUSP: 30.0000 mL | ORAL | Status: DC | PRN

## 2018-08-10 MED ORDER — METHOCARBAMOL 500 MG IVPB - SIMPLE MED
500.0000 mg | Freq: Four times a day (QID) | INTRAVENOUS | Status: DC | PRN
  Filled 2018-08-10: qty 50

## 2018-08-10 MED ORDER — BUPIVACAINE-EPINEPHRINE (PF) 0.25% -1:200000 IJ SOLN
INTRAMUSCULAR | Status: AC
  Filled 2018-08-10: qty 30

## 2018-08-10 MED ORDER — DOCUSATE SODIUM 100 MG PO CAPS
100.0000 mg | ORAL_CAPSULE | Freq: Two times a day (BID) | ORAL | Status: DC
  Administered 2018-08-10 – 2018-08-11 (×2): 100 mg via ORAL
  Filled 2018-08-10 (×2): qty 1

## 2018-08-10 MED ORDER — MENTHOL 3 MG MT LOZG: 1.0000 | LOZENGE | OROMUCOSAL | Status: DC | PRN

## 2018-08-10 MED ORDER — PROPOFOL 10 MG/ML IV BOLUS
INTRAVENOUS | Status: AC
  Filled 2018-08-10: qty 20

## 2018-08-10 MED ORDER — TRANEXAMIC ACID-NACL 1000-0.7 MG/100ML-% IV SOLN
1000.0000 mg | INTRAVENOUS | Status: AC
  Administered 2018-08-10: 10:00:00 1000 mg via INTRAVENOUS
  Filled 2018-08-10: qty 100

## 2018-08-10 MED ORDER — DIPHENHYDRAMINE HCL 12.5 MG/5ML PO ELIX: 12.5000 mg | ORAL_SOLUTION | ORAL | Status: DC | PRN

## 2018-08-10 MED ORDER — HYDROMORPHONE HCL 2 MG PO TABS
2.0000 mg | ORAL_TABLET | ORAL | Status: DC | PRN
  Administered 2018-08-10 – 2018-08-11 (×5): 2 mg via ORAL
  Filled 2018-08-10 (×5): qty 1

## 2018-08-10 MED ORDER — DEXAMETHASONE SODIUM PHOSPHATE 10 MG/ML IJ SOLN
INTRAMUSCULAR | Status: DC | PRN
  Administered 2018-08-10: 8 mg via INTRAVENOUS

## 2018-08-10 MED ORDER — HYDROMORPHONE HCL 1 MG/ML IJ SOLN
0.5000 mg | INTRAMUSCULAR | Status: DC | PRN
  Administered 2018-08-10 (×2): 0.5 mg via INTRAVENOUS
  Filled 2018-08-10 (×2): qty 1

## 2018-08-10 MED ORDER — KCL IN DEXTROSE-NACL 20-5-0.45 MEQ/L-%-% IV SOLN
INTRAVENOUS | Status: DC
  Administered 2018-08-10 (×2): via INTRAVENOUS
  Filled 2018-08-10 (×3): qty 1000

## 2018-08-10 MED ORDER — TRANEXAMIC ACID-NACL 1000-0.7 MG/100ML-% IV SOLN
1000.0000 mg | Freq: Once | INTRAVENOUS | Status: AC
  Administered 2018-08-10: 1000 mg via INTRAVENOUS
  Filled 2018-08-10: qty 100

## 2018-08-10 MED ORDER — OXYCODONE HCL 5 MG PO TABS: 5.0000 mg | ORAL_TABLET | Freq: Once | ORAL | Status: DC | PRN

## 2018-08-10 MED ORDER — SODIUM CHLORIDE (PF) 0.9 % IJ SOLN
INTRAMUSCULAR | Status: DC | PRN
  Administered 2018-08-10: 70 mL

## 2018-08-10 MED ORDER — SODIUM CHLORIDE 0.9 % IR SOLN
Status: DC | PRN
  Administered 2018-08-10: 1000 mL

## 2018-08-10 MED ORDER — METOCLOPRAMIDE HCL 5 MG PO TABS: 5.0000 mg | ORAL_TABLET | Freq: Three times a day (TID) | ORAL | Status: DC | PRN

## 2018-08-10 MED ORDER — FENTANYL CITRATE (PF) 100 MCG/2ML IJ SOLN
50.0000 ug | INTRAMUSCULAR | Status: DC
  Administered 2018-08-10: 09:00:00 50 ug via INTRAVENOUS

## 2018-08-10 MED ORDER — DEXAMETHASONE SODIUM PHOSPHATE 10 MG/ML IJ SOLN
10.0000 mg | Freq: Once | INTRAMUSCULAR | Status: AC
  Administered 2018-08-11: 09:00:00 10 mg via INTRAVENOUS
  Filled 2018-08-10: qty 1

## 2018-08-10 MED ORDER — TRAMADOL HCL 50 MG PO TABS
50.0000 mg | ORAL_TABLET | Freq: Four times a day (QID) | ORAL | Status: DC
  Administered 2018-08-10 – 2018-08-11 (×4): 50 mg via ORAL
  Filled 2018-08-10 (×4): qty 1

## 2018-08-10 MED ORDER — FENTANYL CITRATE (PF) 100 MCG/2ML IJ SOLN: 25.0000 ug | INTRAMUSCULAR | Status: DC | PRN

## 2018-08-10 MED ORDER — PROPOFOL 500 MG/50ML IV EMUL
INTRAVENOUS | Status: DC | PRN
  Administered 2018-08-10: 100 ug/kg/min via INTRAVENOUS

## 2018-08-10 MED ORDER — ASPIRIN 81 MG PO CHEW
81.0000 mg | CHEWABLE_TABLET | Freq: Two times a day (BID) | ORAL | Status: DC
  Administered 2018-08-10 – 2018-08-11 (×2): 81 mg via ORAL
  Filled 2018-08-10 (×2): qty 1

## 2018-08-10 MED ORDER — FLUTICASONE FUROATE-VILANTEROL 100-25 MCG/INH IN AEPB
1.0000 | INHALATION_SPRAY | Freq: Every day | RESPIRATORY_TRACT | Status: DC
  Administered 2018-08-11: 1 via RESPIRATORY_TRACT
  Filled 2018-08-10: qty 28

## 2018-08-10 MED ORDER — OXYCODONE HCL 5 MG/5ML PO SOLN: 5.0000 mg | Freq: Once | ORAL | Status: DC | PRN

## 2018-08-10 MED ORDER — SODIUM CHLORIDE (PF) 0.9 % IJ SOLN
INTRAMUSCULAR | Status: AC
  Filled 2018-08-10: qty 20

## 2018-08-10 MED ORDER — POLYETHYLENE GLYCOL 3350 17 G PO PACK: 17.0000 g | PACK | Freq: Every day | ORAL | Status: DC | PRN

## 2018-08-10 MED ORDER — LIDOCAINE 2% (20 MG/ML) 5 ML SYRINGE
INTRAMUSCULAR | Status: AC
  Filled 2018-08-10: qty 5

## 2018-08-10 MED ORDER — SODIUM CHLORIDE (PF) 0.9 % IJ SOLN
INTRAMUSCULAR | Status: AC
  Filled 2018-08-10: qty 50

## 2018-08-10 MED ORDER — LACTATED RINGERS IV SOLN
INTRAVENOUS | Status: DC
  Administered 2018-08-10 (×2): via INTRAVENOUS

## 2018-08-10 MED ORDER — CHLORHEXIDINE GLUCONATE 4 % EX LIQD: 60.0000 mL | Freq: Once | CUTANEOUS | Status: DC

## 2018-08-10 MED ORDER — DEXAMETHASONE SODIUM PHOSPHATE 10 MG/ML IJ SOLN
INTRAMUSCULAR | Status: AC
  Filled 2018-08-10: qty 1

## 2018-08-10 MED ORDER — CANAGLIFLOZIN 100 MG PO TABS
100.0000 mg | ORAL_TABLET | Freq: Every day | ORAL | Status: DC
  Administered 2018-08-11: 09:00:00 100 mg via ORAL
  Filled 2018-08-10: qty 1

## 2018-08-10 MED ORDER — METHOCARBAMOL 500 MG PO TABS
500.0000 mg | ORAL_TABLET | Freq: Four times a day (QID) | ORAL | Status: DC | PRN
  Administered 2018-08-10 – 2018-08-11 (×4): 500 mg via ORAL
  Filled 2018-08-10 (×4): qty 1

## 2018-08-10 MED ORDER — MIDAZOLAM HCL 2 MG/2ML IJ SOLN
INTRAMUSCULAR | Status: AC
  Administered 2018-08-10: 09:00:00 2 mg via INTRAVENOUS
  Filled 2018-08-10: qty 2

## 2018-08-10 MED ORDER — 0.9 % SODIUM CHLORIDE (POUR BTL) OPTIME
TOPICAL | Status: DC | PRN
  Administered 2018-08-10: 1000 mL

## 2018-08-10 MED ORDER — POVIDONE-IODINE 10 % EX SWAB
2.0000 "application " | Freq: Once | CUTANEOUS | Status: AC
  Administered 2018-08-10: 2 via TOPICAL

## 2018-08-10 MED ORDER — INSULIN ASPART 100 UNIT/ML ~~LOC~~ SOLN
0.0000 [IU] | Freq: Three times a day (TID) | SUBCUTANEOUS | Status: DC
  Administered 2018-08-10 – 2018-08-11 (×2): 3 [IU] via SUBCUTANEOUS

## 2018-08-10 MED ORDER — METFORMIN HCL 500 MG PO TABS
1000.0000 mg | ORAL_TABLET | Freq: Every day | ORAL | Status: DC
  Administered 2018-08-11: 09:00:00 1000 mg via ORAL
  Filled 2018-08-10: qty 2

## 2018-08-10 MED ORDER — BISACODYL 5 MG PO TBEC: 5.0000 mg | DELAYED_RELEASE_TABLET | Freq: Every day | ORAL | Status: DC | PRN

## 2018-08-10 MED ORDER — PANTOPRAZOLE SODIUM 40 MG PO TBEC
40.0000 mg | DELAYED_RELEASE_TABLET | Freq: Every day | ORAL | Status: DC
  Administered 2018-08-10 – 2018-08-11 (×2): 40 mg via ORAL
  Filled 2018-08-10 (×2): qty 1

## 2018-08-10 SURGICAL SUPPLY — 59 items
APL PRP STRL LF DISP 70% ISPRP (MISCELLANEOUS)
ATTUNE MED DOME PAT 38 KNEE (Knees) ×1 IMPLANT
ATTUNE MED DOME PAT 38MM KNEE (Knees) ×1 IMPLANT
ATTUNE PS FEM LT SZ 5 CEM KNEE (Femur) ×2 IMPLANT
ATTUNE PSRP INSR SZ5 5 KNEE (Insert) ×1 IMPLANT
ATTUNE PSRP INSR SZ5 5MM KNEE (Insert) ×1 IMPLANT
BAG DECANTER FOR FLEXI CONT (MISCELLANEOUS) ×3 IMPLANT
BAG SPEC THK2 15X12 ZIP CLS (MISCELLANEOUS) ×1
BAG ZIPLOCK 12X15 (MISCELLANEOUS) ×3 IMPLANT
BANDAGE ACE 6X5 VEL STRL LF (GAUZE/BANDAGES/DRESSINGS) ×5 IMPLANT
BASE TIBIA ATTUNE KNEE SYS SZ6 (Knees) IMPLANT
BLADE SAG 18X100X1.27 (BLADE) ×3 IMPLANT
BLADE SAW SGTL 11.0X1.19X90.0M (BLADE) ×3 IMPLANT
BLADE SURG SZ10 CARB STEEL (BLADE) ×6 IMPLANT
BOWL SMART MIX CTS (DISPOSABLE) ×3 IMPLANT
BSPLAT TIB 6 CMNT ROT PLAT STR (Knees) ×1 IMPLANT
CEMENT HV SMART SET (Cement) ×6 IMPLANT
CHLORAPREP W/TINT 26 (MISCELLANEOUS) ×1 IMPLANT
COVER SURGICAL LIGHT HANDLE (MISCELLANEOUS) ×3 IMPLANT
COVER WAND RF STERILE (DRAPES) ×2 IMPLANT
CUFF TOURN SGL QUICK 34 (TOURNIQUET CUFF) ×3
CUFF TRNQT CYL 34X4.125X (TOURNIQUET CUFF) ×1 IMPLANT
DECANTER SPIKE VIAL GLASS SM (MISCELLANEOUS) ×9 IMPLANT
DRAPE U-SHAPE 47X51 STRL (DRAPES) ×3 IMPLANT
DRSG AQUACEL AG ADV 3.5X10 (GAUZE/BANDAGES/DRESSINGS) ×3 IMPLANT
DURAPREP 26ML APPLICATOR (WOUND CARE) ×2 IMPLANT
ELECT REM PT RETURN 15FT ADLT (MISCELLANEOUS) ×3 IMPLANT
GLOVE BIO SURGEON STRL SZ7.5 (GLOVE) ×3 IMPLANT
GLOVE BIO SURGEON STRL SZ8.5 (GLOVE) ×3 IMPLANT
GLOVE BIOGEL PI IND STRL 8 (GLOVE) ×1 IMPLANT
GLOVE BIOGEL PI IND STRL 9 (GLOVE) ×1 IMPLANT
GLOVE BIOGEL PI INDICATOR 8 (GLOVE) ×2
GLOVE BIOGEL PI INDICATOR 9 (GLOVE) ×2
GOWN STRL REUS W/TWL XL LVL3 (GOWN DISPOSABLE) ×6 IMPLANT
HANDPIECE INTERPULSE COAX TIP (DISPOSABLE) ×3
HOOD PEEL AWAY FLYTE STAYCOOL (MISCELLANEOUS) ×9 IMPLANT
KIT TURNOVER KIT A (KITS) IMPLANT
NDL HYPO 21X1.5 SAFETY (NEEDLE) ×2 IMPLANT
NEEDLE HYPO 21X1.5 SAFETY (NEEDLE) ×6 IMPLANT
NS IRRIG 1000ML POUR BTL (IV SOLUTION) ×3 IMPLANT
PACK ICE MAXI GEL EZY WRAP (MISCELLANEOUS) ×3 IMPLANT
PACK TOTAL KNEE CUSTOM (KITS) ×3 IMPLANT
PIN STEINMAN FIXATION KNEE (PIN) ×2 IMPLANT
PROTECTOR NERVE ULNAR (MISCELLANEOUS) ×3 IMPLANT
SET HNDPC FAN SPRY TIP SCT (DISPOSABLE) ×1 IMPLANT
STAPLER VISISTAT 35W (STAPLE) IMPLANT
SUT VIC AB 1 CTX 36 (SUTURE) ×3
SUT VIC AB 1 CTX36XBRD ANBCTR (SUTURE) ×1 IMPLANT
SUT VIC AB 2-0 CT1 27 (SUTURE) ×3
SUT VIC AB 2-0 CT1 TAPERPNT 27 (SUTURE) ×1 IMPLANT
SUT VIC AB 3-0 CT1 27 (SUTURE) ×3
SUT VIC AB 3-0 CT1 TAPERPNT 27 (SUTURE) ×1 IMPLANT
SYR CONTROL 10ML LL (SYRINGE) ×6 IMPLANT
TIBIA ATTUNE KNEE SYS BASE SZ6 (Knees) ×3 IMPLANT
TRAY FOLEY MTR SLVR 14FR STAT (SET/KITS/TRAYS/PACK) ×2 IMPLANT
TRAY FOLEY MTR SLVR 16FR STAT (SET/KITS/TRAYS/PACK) ×1 IMPLANT
WATER STERILE IRR 1000ML POUR (IV SOLUTION) ×6 IMPLANT
WRAP KNEE MAXI GEL POST OP (GAUZE/BANDAGES/DRESSINGS) ×2 IMPLANT
YANKAUER SUCT BULB TIP 10FT TU (MISCELLANEOUS) ×3 IMPLANT

## 2018-08-10 NOTE — Transfer of Care (Signed)
Immediate Anesthesia Transfer of Care Note  Patient: Darlene Wade  Procedure(s) Performed: Left Knee Arthroplasty (Left Knee)  Patient Location: PACU  Anesthesia Type:Spinal and MAC combined with regional for post-op pain  Level of Consciousness: awake, alert , oriented and patient cooperative  Airway & Oxygen Therapy: Patient Spontanous Breathing and Patient connected to face mask oxygen  Post-op Assessment: Report given to RN and Post -op Vital signs reviewed and stable  Post vital signs: Reviewed and stable  Last Vitals:  Vitals Value Taken Time  BP 99/65 08/10/18 1156  Temp    Pulse 77 08/10/18 1157  Resp 20 08/10/18 1157  SpO2 98 % 08/10/18 1157  Vitals shown include unvalidated device data.  Last Pain:  Vitals:   08/10/18 0801  TempSrc: Oral  PainSc:       Patients Stated Pain Goal: 4 (41/74/08 1448)  Complications: No apparent anesthesia complications

## 2018-08-10 NOTE — Anesthesia Procedure Notes (Signed)
Anesthesia Regional Block: Adductor canal block   Pre-Anesthetic Checklist: ,, timeout performed, Correct Patient, Correct Site, Correct Laterality, Correct Procedure, Correct Position, site marked, Risks and benefits discussed,  Surgical consent,  Pre-op evaluation,  At surgeon's request and post-op pain management  Laterality: Left  Prep: chloraprep       Needles:  Injection technique: Single-shot  Needle Type: Echogenic Stimulator Needle     Needle Length: 9cm  Needle Gauge: 21     Additional Needles:   Procedures:,,,, ultrasound used (permanent image in chart),,,,  Narrative:  Start time: 08/10/2018 8:35 AM End time: 08/10/2018 8:42 AM Injection made incrementally with aspirations every 5 mL.  Performed by: Personally  Anesthesiologist: Lidia Collum, MD  Additional Notes: Monitors applied. Injection made in 5cc increments. No resistance to injection. Good needle visualization. Patient tolerated procedure well.

## 2018-08-10 NOTE — Op Note (Signed)
PATIENT ID:      Darlene Wade  MRN:     [id number] DOB/AGE:    08-01-1955 / 63 y.o.       OPERATIVE REPORT    DATE OF PROCEDURE:  08/10/2018       PREOPERATIVE DIAGNOSIS:   Left Knee Osteoarthritis      Estimated body mass index is 38.9 kg/m as calculated from the following:   Height as of this encounter: 5\' 3"  (1.6 m).   Weight as of this encounter: 99.6 kg.                                                        POSTOPERATIVE DIAGNOSIS:   Left Knee Osteoarthritis                                                                      PROCEDURE:  Procedure(s): Left Knee Arthroplasty Using DepuyAttune RP implants #5L Femur, #6Tibia, 5 mm Attune RP bearing, 38 Patella     SURGEON: [name]    ASSISTANT:   Kerry Hough. [employer]   (Present and scrubbed throughout the case, critical for assistance with exposure, retraction, instrumentation, and closure.)         ANESTHESIA: Spinal, 20cc Exparel, 50cc 0.25% Marcaine  EBL: 300 cc  FLUID REPLACEMENT: 1600 cc crystaloid  Tourniquet Time: None  Drains: None  Tranexamic Acid: 1gm IV, 2gm topical  Exparel: 266mg    COMPLICATIONS:  None         INDICATIONS FOR PROCEDURE: The patient has  Left Knee Osteoarthritis, Var deformities, XR shows bone on bone arthritis, lateral subluxation of tibia. Patient has failed all conservative measures including anti-inflammatory medicines, narcotics, attempts at exercise and weight loss, cortisone injections and viscosupplementation.  Risks and benefits of surgery have been discussed, questions answered.   DESCRIPTION OF PROCEDURE: The patient identified by armband, received  IV antibiotics, in the holding area at [facility]. Patient taken to the operating room, appropriate anesthetic monitors were attached, and Spinal anesthesia was  induced. IV Tranexamic acid was given.Tourniquet applied high to the operative thigh. Lateral post and foot positioner applied to the table, the lower extremity was then  prepped and draped in usual sterile fashion from the toes to the tourniquet. Time-out procedure was performed. The skin and subcutaneous tissue along the incision was injected with 20 cc of a mixture of Exparel and Marcaine solution, using a 20-gauge by 1-1/2 inch needle. We began the operation, with the knee flexed 130 degrees, by making the anterior midline incision starting at handbreadth above the patella going over the patella 1 cm medial to and 4 cm distal to the tibial tubercle. Small bleeders in the skin and the subcutaneous tissue identified and cauterized. Transverse retinaculum was incised and reflected medially and a medial parapatellar arthrotomy was accomplished. the patella was everted and theprepatellar fat pad resected. The superficial medial collateral ligament was then elevated from anterior to posterior along the proximal flare of the tibia and anterior half of the menisci resected. The knee was hyperflexed exposing bone  on bone arthritis. Peripheral and notch osteophytes as well as the cruciate ligaments were then resected. We continued to work our way around posteriorly along the proximal tibia, and externally rotated the tibia subluxing it out from underneath the femur. A McHale retractor was placed through the notch and a lateral Hohmann retractor placed, and we then drilled through the proximal tibia in line with the axis of the tibia followed by an intramedullary guide rod and 2-degree posterior slope cutting guide. The tibial cutting guide, 4 degree posterior sloped, was pinned into place allowing resection of 4 mm of bone medially and 10 mm of bone laterally. Satisfied with the tibial resection, we then entered the distal femur 2 mm anterior to the PCL origin with the intramedullary guide rod and applied the distal femoral cutting guide set at 9 mm, with 5 degrees of valgus. This was pinned along the epicondylar axis. At this point, the distal femoral cut was accomplished without  difficulty. We then sized for a #5L femoral component and pinned the guide in 3 degrees of external rotation. The chamfer cutting guide was pinned into place. The anterior, posterior, and chamfer cuts were accomplished without difficulty followed by the Attune RP box cutting guide and the box cut. We also removed posterior osteophytes from the posterior femoral condyles. The posterior capsule was injected with Exparel solution. The knee was brought into full extension. We checked our extension gap and fit a 5 mm bearing. Distracting in extension with a lamina spreader,  bleeders in the posterior capsule, Posterior medial and posterior lateral right down and cauterized.  The transexamic acid-soaked sponge was then placed in the gap of the knee and extension. The knee was flexed 30. The posterior patella cut was accomplished with the 9.5 mm Attune cutting guide, sized for a 59mm dome, and the fixation pegs drilled.The knee was then once again hyperflexed exposing the proximal tibia. We sized for a # 6 tibial base plate, applied the smokestack and the conical reamer followed by the the Delta fin keel punch. We then hammered into place the Attune RP trial femoral component, drilled the lugs, inserted a  5 mm trial bearing, trial patellar button, and took the knee through range of motion from 0-130 degrees. Medial and lateral ligamentous stability was checked. No thumb pressure was required for patellar Tracking. The tourniquet was not used. All trial components were removed, mating surfaces irrigated with pulse lavage, and dried with suction and sponges. 10 cc of the Exparel solution was applied to the cancellus bone of the patella distal femur and proximal tibia.  After waiting 30 seconds, the bony surfaces were again, dried with sponges. A double batch of DePuy HV cement was mixed and applied to all bony metallic mating surfaces except for the posterior condyles of the femur itself. In order, we hammered into place  the tibial tray and removed excess cement, the femoral component and removed excess cement. The final Attune RP bearing was inserted, and the knee brought to full extension with compression. The patellar button was clamped into place, and excess cement removed. The knee was held at 30 flexion with compression, while the cement cured. The wound was irrigated out with normal saline solution pulse lavage. The rest of the Exparel was injected into the parapatellar arthrotomy, subcutaneous tissues, and periosteal tissues. The parapatellar arthrotomy was closed with running #1 Vicryl suture. The subcutaneous tissue with 0 and 2-0 undyed Vicryl suture, and the skin with running 3-0 SQ vicryl. An Aquacil and  Ace wrap were applied. The patient was taken to recovery room without difficulty.   [name] 08/10/2018, 11:37 AM

## 2018-08-10 NOTE — Progress Notes (Signed)
Assisted Dr Roger Kill with left, ultrasound guided, adductor canal block. Side rails up, monitors on throughout procedure. See vital signs in flow sheet. Tolerated Procedure well.

## 2018-08-10 NOTE — Discharge Instructions (Signed)

## 2018-08-10 NOTE — Interval H&P Note (Signed)
History and Physical Interval Note:  08/10/2018 9:25 AM  Darlene Wade  has presented today for surgery, with the diagnosis of Left Knee Osteoarthritis.  The various methods of treatment have been discussed with the patient and family. After consideration of risks, benefits and other options for treatment, the patient has consented to  Procedure(s): Left Knee Arthroplasty (Left) as a surgical intervention.  The patient's history has been reviewed, patient examined, no change in status, stable for surgery.  I have reviewed the patient's chart and labs.  Questions were answered to the patient's satisfaction.     [name]

## 2018-08-10 NOTE — Evaluation (Signed)
Physical Therapy Evaluation Patient Details Name: Darlene Wade MRN: [id number] DOB: 11-29-1955 Today's Date: 08/10/2018   History of Present Illness  L TKA  Clinical Impression  Pt is s/p TKA resulting in the deficits listed below (see PT Problem List). Pt ambulated 10' with RW with min/guard assist, some buckling of L knee noted. Good progress expected.  Pt will benefit from skilled PT to increase their independence and safety with mobility to allow discharge to the venue listed below.      Follow Up Recommendations Follow surgeon's recommendation for DC plan and follow-up therapies    Equipment Recommendations  3in1 (PT)    Recommendations for Other Services       Precautions / Restrictions Precautions Precautions: Knee Precaution Comments: reviewed no pillow under knee Restrictions Weight Bearing Restrictions: No Other Position/Activity Restrictions: WBAT      Mobility  Bed Mobility Overal bed mobility: Modified Independent             General bed mobility comments: HOB up  Transfers Overall transfer level: Needs assistance Equipment used: Rolling walker (2 wheeled) Transfers: Sit to/from Stand Sit to Stand: Min assist         General transfer comment: VCs hand placement, min A to rise  Ambulation/Gait Ambulation/Gait assistance: Min guard Gait Distance (Feet): 10 Feet Assistive device: Rolling walker (2 wheeled) Gait Pattern/deviations: Step-to pattern;Decreased stride length;Decreased weight shift to left Gait velocity: decr   General Gait Details: some buckling of LLE with weight bearing, VCs to increase WB thru UEs with WB on LLE, distance limited by 8/10 pain  Stairs            Wheelchair Mobility    Modified Rankin (Stroke Patients Only)       Balance Overall balance assessment: Needs assistance   Sitting balance-Leahy Scale: Good     Standing balance support: Bilateral upper extremity supported Standing balance-Leahy Scale:  Poor Standing balance comment: relies on BUE support                             Pertinent Vitals/Pain Pain Assessment: 0-10 Pain Score: 8  Pain Location: R knee Pain Descriptors / Indicators: Sore    Home Living Family/patient expects to be discharged to:: Private residence Living Arrangements: Spouse/significant other Available Help at Discharge: Family;Available 24 hours/day   Home Access: Stairs to enter Entrance Stairs-Rails: Right Entrance Stairs-Number of Steps: 3 Home Layout: One level Home Equipment: Walker - 2 wheels      Prior Function Level of Independence: Independent               Hand Dominance        Extremity/Trunk Assessment   Upper Extremity Assessment Upper Extremity Assessment: Overall WFL for tasks assessed    Lower Extremity Assessment Lower Extremity Assessment: LLE deficits/detail LLE Deficits / Details: knee ext at least 3/5; SLR -3/5 LLE Sensation: WNL LLE Coordination: WNL    Cervical / Trunk Assessment Cervical / Trunk Assessment: Normal  Communication   Communication: No difficulties  Cognition Arousal/Alertness: Awake/alert Behavior During Therapy: WFL for tasks assessed/performed Overall Cognitive Status: Within Functional Limits for tasks assessed                                        General Comments      Exercises Total Joint Exercises Ankle Circles/Pumps: AROM;Both;10  reps;Supine Straight Leg Raises: Left;AAROM(x1 rep) Long Arc Quad: AROM;Left;10 reps;Seated Goniometric ROM: 5-60* AAROM L knee   Assessment/Plan    PT Assessment Patient needs continued PT services  PT Problem List Decreased strength;Decreased range of motion;Decreased activity tolerance;Decreased mobility;Pain;Decreased knowledge of use of DME       PT Treatment Interventions Gait training;DME instruction;Stair training;Functional mobility training;Therapeutic activities;Therapeutic exercise;Patient/family  education    PT Goals (Current goals can be found in the Care Plan section)  Acute Rehab PT Goals Patient Stated Goal: return to water aerobics, walk without pain PT Goal Formulation: With patient Time For Goal Achievement: 08/17/18 Potential to Achieve Goals: Good    Frequency 7X/week   Barriers to discharge        Co-evaluation               AM-PAC PT "6 Clicks" Mobility  Outcome Measure Help needed turning from your back to your side while in a flat bed without using bedrails?: A Little Help needed moving from lying on your back to sitting on the side of a flat bed without using bedrails?: A Little Help needed moving to and from a bed to a chair (including a wheelchair)?: A Little Help needed standing up from a chair using your arms (e.g., wheelchair or bedside chair)?: A Little Help needed to walk in hospital room?: A Little Help needed climbing 3-5 steps with a railing? : A Lot 6 Click Score: 17    End of Session Equipment Utilized During Treatment: Gait belt Activity Tolerance: Patient tolerated treatment well Patient left: in chair;with call bell/phone within reach;with chair alarm set Nurse Communication: Mobility status PT Visit Diagnosis: Muscle weakness (generalized) (M62.81);Pain Pain - Right/Left: Left Pain - part of body: Knee    Time: 2263-3354 PT Time Calculation (min) (ACUTE ONLY): 25 min   Charges:   PT Evaluation $PT Eval Low Complexity: 1 Low PT Treatments $Gait Training: 8-22 mins       Blondell Reveal Kistler PT 08/10/2018  Acute Rehabilitation Services Pager [phone] Office [phone]

## 2018-08-10 NOTE — Anesthesia Procedure Notes (Signed)
Spinal  Patient location during procedure: OR End time: 08/10/2018 10:19 AM Staffing Resident/CRNA: Caryl Pina T, CRNA Performed: resident/CRNA  Preanesthetic Checklist Completed: patient identified, site marked, surgical consent, pre-op evaluation, timeout performed, IV checked, risks and benefits discussed and monitors and equipment checked Spinal Block Patient position: sitting Prep: DuraPrep Patient monitoring: heart rate, cardiac monitor, continuous pulse ox and blood pressure Approach: midline Location: L3-4 Injection technique: single-shot Needle Needle type: Pencan  Needle gauge: 24 G Needle length: 9 cm Assessment Sensory level: T4 Additional Notes Expiration date of kit checked and confirmed. Patient tolerated procedure well, without complications.

## 2018-08-11 ENCOUNTER — Encounter (HOSPITAL_COMMUNITY): Payer: Self-pay | Admitting: Orthopedic Surgery

## 2018-08-11 DIAGNOSIS — E119 Type 2 diabetes mellitus without complications: Secondary | ICD-10-CM | POA: Diagnosis not present

## 2018-08-11 DIAGNOSIS — M1712 Unilateral primary osteoarthritis, left knee: Secondary | ICD-10-CM | POA: Diagnosis not present

## 2018-08-11 DIAGNOSIS — E785 Hyperlipidemia, unspecified: Secondary | ICD-10-CM | POA: Diagnosis not present

## 2018-08-11 DIAGNOSIS — Z87891 Personal history of nicotine dependence: Secondary | ICD-10-CM | POA: Diagnosis not present

## 2018-08-11 DIAGNOSIS — Z96652 Presence of left artificial knee joint: Secondary | ICD-10-CM | POA: Diagnosis not present

## 2018-08-11 DIAGNOSIS — J4520 Mild intermittent asthma, uncomplicated: Secondary | ICD-10-CM | POA: Diagnosis not present

## 2018-08-11 DIAGNOSIS — I5042 Chronic combined systolic (congestive) and diastolic (congestive) heart failure: Secondary | ICD-10-CM | POA: Diagnosis not present

## 2018-08-11 DIAGNOSIS — I110 Hypertensive heart disease with heart failure: Secondary | ICD-10-CM | POA: Diagnosis not present

## 2018-08-11 DIAGNOSIS — G4700 Insomnia, unspecified: Secondary | ICD-10-CM | POA: Diagnosis not present

## 2018-08-11 LAB — CBC
HCT: 34.4 % — ABNORMAL LOW (ref 36.0–46.0)
Hemoglobin: 10.4 g/dL — ABNORMAL LOW (ref 12.0–15.0)
MCH: 27.4 pg (ref 26.0–34.0)
MCHC: 30.2 g/dL (ref 30.0–36.0)
MCV: 90.8 fL (ref 80.0–100.0)
Platelets: 240 10*3/uL (ref 150–400)
RBC: 3.79 MIL/uL — ABNORMAL LOW (ref 3.87–5.11)
RDW: 15.4 % (ref 11.5–15.5)
WBC: 13.1 10*3/uL — ABNORMAL HIGH (ref 4.0–10.5)
nRBC: 0 % (ref 0.0–0.2)

## 2018-08-11 LAB — BASIC METABOLIC PANEL
Anion gap: 8 (ref 5–15)
BUN: 17 mg/dL (ref 8–23)
CO2: 21 mmol/L — ABNORMAL LOW (ref 22–32)
Calcium: 9 mg/dL (ref 8.9–10.3)
Chloride: 106 mmol/L (ref 98–111)
Creatinine, Ser: 0.74 mg/dL (ref 0.44–1.00)
GFR calc Af Amer: >60 mL/min (ref >60)
GFR calc non Af Amer: >60 mL/min (ref >60)
Glucose, Bld: 215 mg/dL — ABNORMAL HIGH (ref 70–99)
Potassium: 3.9 mmol/L (ref 3.5–5.1)
Sodium: 135 mmol/L (ref 135–145)

## 2018-08-11 LAB — GLUCOSE, CAPILLARY
Glucose-Capillary: 170 mg/dL — ABNORMAL HIGH (ref 70–99)
Glucose-Capillary: 103 mg/dL — ABNORMAL HIGH (ref 70–99)

## 2018-08-11 MED ORDER — TIZANIDINE HCL 2 MG PO TABS: 2.0000 mg | ORAL_TABLET | Freq: Four times a day (QID) | ORAL | 0 refills | Status: DC | PRN

## 2018-08-11 MED ORDER — ALBUTEROL SULFATE (2.5 MG/3ML) 0.083% IN NEBU: 2.5000 mg | INHALATION_SOLUTION | Freq: Four times a day (QID) | RESPIRATORY_TRACT | Status: DC | PRN

## 2018-08-11 MED ORDER — OXYCODONE-ACETAMINOPHEN 5-325 MG PO TABS: 1.0000 | ORAL_TABLET | ORAL | 0 refills | Status: DC | PRN

## 2018-08-11 MED ORDER — ASPIRIN EC 81 MG PO TBEC: 81.0000 mg | DELAYED_RELEASE_TABLET | Freq: Two times a day (BID) | ORAL | 0 refills | Status: DC

## 2018-08-11 NOTE — Anesthesia Postprocedure Evaluation (Signed)
Anesthesia Post Note  Patient: Darlene Wade  Procedure(s) Performed: Left Knee Arthroplasty (Left Knee)     Patient location during evaluation: PACU Anesthesia Type: Spinal Level of consciousness: oriented and awake and alert Pain management: pain level controlled Vital Signs Assessment: post-procedure vital signs reviewed and stable Respiratory status: spontaneous breathing, respiratory function stable and nonlabored ventilation Cardiovascular status: blood pressure returned to baseline and stable Postop Assessment: no headache, no backache, no apparent nausea or vomiting and spinal receding Anesthetic complications: no    Last Vitals:  Vitals:   08/11/18 0511 08/11/18 0710  BP: (!) 144/83   Pulse: 70   Resp: 19   Temp: 36.4 C   SpO2: 97% 99%    Last Pain:  Vitals:   08/11/18 0600  TempSrc:   PainSc: 8                  Lidia Collum

## 2018-08-11 NOTE — Discharge Summary (Signed)
Patient ID: Darlene Wade MRN: [id number] DOB/AGE: 65-Sep-1957 65 y.o.  Admit date: 08/10/2018 Discharge date: 08/11/2018  Admission Diagnoses:  Active Problems:   Degenerative arthritis of left knee   S/P total knee arthroplasty, left   Discharge Diagnoses:  Same  Past Medical History:  Diagnosis Date  . Asthma 07/20/2018   one puff per day  . Chronic combined systolic and diastolic heart failure ([place]) 03/03/2018   Echo 07/2018: EF 40-45, diff HK worse in Inf base, normal RVSF  . Diabetes mellitus without complication ([place]) 3474  . Diabetic eye exam [facility])    [employer]  . Dyspnea   . Elbow fracture, right 2007  . Former smoker    20 pack year history, quit 2010  . Hyperlipidemia   . Hypertension   . Insomnia   . Lung nodule    Chest CT 07/2018:  RLL nodule resolved.  3 mm subpleural LUL nodule.  Repeat in 1 year if high risk.   . Obesity   . PONV (postoperative nausea and vomiting) 2014   1 time  . Wears glasses    reading    Surgeries: Procedure(s): Left Knee Arthroplasty on 08/10/2018   Consultants:   Discharged Condition: Improved  Hospital Course: Darlene Wade is an 63 y.o. female who was admitted 08/10/2018 for operative treatment of<principal problem not specified>. Patient has severe unremitting pain that affects sleep, daily activities, and work/hobbies. After pre-op clearance the patient was taken to the operating room on 08/10/2018 and underwent  Procedure(s): Left Knee Arthroplasty.    Patient was given perioperative antibiotics:  Anti-infectives (From admission, onward)   Start     Dose/Rate Route Frequency Ordered Stop   08/10/18 0745  ceFAZolin (ANCEF) IVPB 2g/100 mL premix     2 g 200 mL/hr over 30 Minutes Intravenous On call to O.R. 08/10/18 2595 08/10/18 1021       Patient was given sequential compression devices, early ambulation, and chemoprophylaxis to prevent DVT.  Patient benefited maximally from hospital stay and there were no  complications.    Recent vital signs:  Patient Vitals for the past 24 hrs:  BP Temp Temp src Pulse Resp SpO2  08/11/18 0920 123/74 97.6 F (36.4 C) Oral 75 16 100 %  08/11/18 0710 - - - - - 99 %  08/11/18 0511 (!) 144/83 97.6 F (36.4 C) Oral 70 19 97 %  08/11/18 0206 119/78 97.9 F (36.6 C) Oral 78 18 97 %  08/10/18 2126 137/76 97.8 F (36.6 C) Oral 83 18 100 %  08/10/18 1950 130/75 - - 78 - -  08/10/18 1640 (!) 113/99 (!) 97.5 F (36.4 C) Oral 78 20 100 %  08/10/18 1537 126/81 (!) 97.5 F (36.4 C) Oral 87 20 100 %  08/10/18 1443 128/68 97.8 F (36.6 C) Oral 67 18 100 %  08/10/18 1334 137/69 - - (!) 57 12 100 %  08/10/18 1315 127/69 - - (!) 55 19 100 %  08/10/18 1300 132/69 (!) 97.4 F (36.3 C) - 87 17 100 %  08/10/18 1245 123/61 - - 70 12 100 %     Recent laboratory studies:  Recent Labs    08/11/18 0320  WBC 13.1*  HGB 10.4*  HCT 34.4*  PLT 240  NA 135  K 3.9  CL 106  CO2 21*  BUN 17  CREATININE 0.74  GLUCOSE 215*  CALCIUM 9.0     Discharge Medications:   Allergies as of 08/11/2018  Reactions   Oxycodone Nausea And Vomiting   Naproxen Nausea Only   Vicodin [hydrocodone-acetaminophen] Nausea Only      Medication List    STOP taking these medications   traMADol 50 MG tablet Commonly known as: ULTRAM     TAKE these medications   Accu-Chek Aviva Plus w/Device Kit Test blood sugar 2 times daily   aspirin EC 81 MG tablet Take 1 tablet (81 mg total) by mouth 2 (two) times daily. What changed: when to take this   atorvastatin 40 MG tablet Commonly known as: LIPITOR TAKE 1 TABLET EVERY DAY What changed: how much to take   carvedilol 3.125 MG tablet Commonly known as: COREG Take 1 tablet (3.125 mg total) by mouth 2 (two) times daily.   cholecalciferol 25 MCG (1000 UT) tablet Commonly known as: VITAMIN D3 Take 2 tablets (2,000 Units total) by mouth daily.   empagliflozin 10 MG Tabs tablet Commonly known as: Jardiance Take 10 mg by mouth  daily.   fluticasone furoate-vilanterol 100-25 MCG/INH Aepb Commonly known as: Breo Ellipta Inhale 1 puff into the lungs daily.   losartan 50 MG tablet Commonly known as: Cozaar Take 1 tablet (50 mg total) by mouth daily.   metFORMIN 1000 MG tablet Commonly known as: GLUCOPHAGE Take 1 tablet (1,000 mg total) by mouth 2 (two) times daily with a meal. What changed: when to take this   multivitamin tablet Take 1 tablet by mouth daily.   oxyCODONE-acetaminophen 5-325 MG tablet Commonly known as: PERCOCET/ROXICET Take 1 tablet by mouth every 4 (four) hours as needed for severe pain.   tiZANidine 2 MG tablet Commonly known as: ZANAFLEX Take 1 tablet (2 mg total) by mouth every 6 (six) hours as needed.            Durable Medical Equipment  (From admission, onward)         Start     Ordered   08/10/18 1326  DME Walker rolling  Once    Question:  Patient needs a walker to treat with the following condition  Answer:  Status post total left knee replacement   08/10/18 1325   08/10/18 1326  DME 3 n 1  Once     08/10/18 1325          Diagnostic Studies: Ct Chest Wo Contrast  Result Date: 07/27/2018 CLINICAL DATA:  Follow-up right lower lobe lung nodule on cardiac CT 03/05/2018 EXAM: CT CHEST WITHOUT CONTRAST TECHNIQUE: Multidetector CT imaging of the chest was performed following the standard protocol without IV contrast. COMPARISON:  CT coronary angiogram, 03/05/2018 FINDINGS: Cardiovascular: Aortic atherosclerosis. Minimal scattered left coronary artery calcification. Normal heart size. No pericardial effusion. Mediastinum/Nodes: No enlarged mediastinal, hilar, or axillary lymph nodes. Thyroid gland, trachea, and esophagus demonstrate no significant findings. Lungs/Pleura: Minimal bandlike scarring of the right middle lobe. A previously noted right lower lobe ground-glass pulmonary nodule is resolved, consistent with resolution of infection or inflammation. Unchanged 3 mm  subpleural nodule of the left upper lobe (series 3, image 58). No pleural effusion or pneumothorax. Upper Abdomen: No acute abnormality. Benign, fat containing left adrenal adenoma. Musculoskeletal: No chest wall mass or suspicious bone lesions identified. IMPRESSION: A previously noted right lower lobe ground-glass pulmonary nodule is resolved, consistent with resolution of infection or inflammation. Unchanged 3 mm subpleural nodule of the left upper lobe (series 3, image 58). Further follow-up of the latter nodule is optional at 12 months if there are high risk factors for lung cancer. Consider annual  CT lung cancer screening if indicated by patient age, smoking history, and/or other risk factors for lung cancer. Electronically Signed   By: Eddie Candle M.D.   On: 07/27/2018 08:15    Disposition:     Follow-up Information    Frederik Pear, MD In 2 weeks.   Specialty: Orthopedic Surgery Contact information: [address] [place] [address] [phone]            Signed: Joanell Rising 08/11/2018, 12:35 PM

## 2018-08-11 NOTE — Progress Notes (Signed)
PATIENT ID: Darlene Wade  MRN: [id number]  DOB/AGE:  09/10/1955 / 63 y.o.  1 Day Post-Op Procedure(s) (LRB): Left Knee Arthroplasty (Left)    PROGRESS NOTE Subjective: Patient is alert, oriented, no Nausea, no Vomiting, yes passing gas. Taking PO well. Denies SOB, Chest or Calf Pain. Using Incentive Spirometer, PAS in place. Ambulate in hallway, Patient reports pain as 2/10 .    Objective: Vital signs in last 24 hours: Vitals:   08/10/18 2126 08/11/18 0206 08/11/18 0511 08/11/18 0710  BP: 137/76 119/78 (Abnormal) 144/83   Pulse: 83 78 70   Resp: 18 18 19    Temp: 97.8 F (36.6 C) 97.9 F (36.6 C) 97.6 F (36.4 C)   TempSrc: Oral Oral Oral   SpO2: 100% 97% 97% 99%  Weight:      Height:          Intake/Output from previous day: I/O last 3 completed shifts: In: 4380.3 [P.O.:1010; I.V.:3080.4; IV Piggyback:289.8] Out: 2475 [Urine:2325; Blood:150]   Intake/Output this shift: No intake/output data recorded.   LABORATORY DATA: Recent Labs    08/10/18 1621 08/10/18 2124 08/11/18 0320 08/11/18 0733  WBC  --   --  13.1*  --   HGB  --   --  10.4*  --   HCT  --   --  34.4*  --   PLT  --   --  240  --   NA  --   --  135  --   K  --   --  3.9  --   CL  --   --  106  --   CO2  --   --  21*  --   BUN  --   --  17  --   CREATININE  --   --  0.74  --   GLUCOSE  --   --  215*  --   GLUCAP 185* 174*  --  170*  CALCIUM  --   --  9.0  --     Examination: Neurologically intact ABD soft Neurovascular intact Sensation intact distally Intact pulses distally Dorsiflexion/Plantar flexion intact Incision: dressing C/D/I No cellulitis present Compartment soft}  Assessment:   1 Day Post-Op Procedure(s) (LRB): Left Knee Arthroplasty (Left) ADDITIONAL DIAGNOSIS: Expected Acute Blood Loss Anemia, Diabetes, morbid obesity  Patient's anticipated LOS is less than 2 midnights, meeting these requirements: - Younger than 65 - Lives within 1 hour of care - Has a competent adult at  home to recover with post-op recover    Plan: PT/OT WBAT, AROM and PROM  DVT Prophylaxis:  SCDx72hrs, ASA 81 mg BID x 2 weeks DISCHARGE PLAN: Home, if passes PT DISCHARGE NEEDS: HHPT, Walker and 3-in-1 comode seat     [name] 08/11/2018, 7:40 AM

## 2018-08-11 NOTE — Plan of Care (Signed)
  Problem: Clinical Measurements: Goal: Ability to maintain clinical measurements within normal limits will improve Outcome: Progressing Goal: Will remain free from infection Outcome: Progressing Goal: Respiratory complications will improve Outcome: Progressing   Problem: Pain Managment: Goal: General experience of comfort will improve Outcome: Progressing   Problem: Safety: Goal: Ability to remain free from injury will improve Outcome: Progressing

## 2018-08-11 NOTE — Progress Notes (Signed)
Physical Therapy Treatment Patient Details Name: Darlene Wade MRN: [id number] DOB: Jun 17, 1955 Today's Date: 08/11/2018    History of Present Illness L TKA    PT Comments    Pt is progressing well with mobility, she has met PT goals and is ready to DC home from PT standpoint. She ambulated 100' with RW, with stair training her LLE buckled with 1st attempt, so applied KI and pt was able to ascend/descend 3 stairs without buckling. Pt advised to wear KI for stairs and for ambulation, as she reported feeling more supported wearing it while walking. Pt demonstrates good understanding of HEP.   Follow Up Recommendations  Follow surgeon's recommendation for DC plan and follow-up therapies     Equipment Recommendations  3in1 (PT)    Recommendations for Other Services       Precautions / Restrictions Precautions Precautions: Knee Precaution Booklet Issued: Yes (comment) Precaution Comments: reviewed no pillow under knee Required Braces or Orthoses: Knee Immobilizer - Left(applied KI 2* buckling with stairs) Knee Immobilizer - Left: On when out of bed or walking Restrictions Weight Bearing Restrictions: No Other Position/Activity Restrictions: WBAT    Mobility  Bed Mobility Overal bed mobility: Modified Independent             General bed mobility comments: HOB up  Transfers Overall transfer level: Needs assistance Equipment used: Rolling walker (2 wheeled) Transfers: Sit to/from Stand Sit to Stand: Supervision         General transfer comment: VCs hand placement  Ambulation/Gait Ambulation/Gait assistance: Min guard Gait Distance (Feet): 100 Feet Assistive device: Rolling walker (2 wheeled) Gait Pattern/deviations: Step-to pattern;Decreased stride length;Decreased weight shift to left Gait velocity: decr   General Gait Details: VCs sequencing and positioning in RW   Stairs Stairs: Yes Stairs assistance: Min assist Stair Management: One rail  Left;Forwards;With cane Number of Stairs: 3 General stair comments: LLE buckled with first step, required +2 assist to prevent fall, applied KI to LLE and then pt was able to do 3 steps without buckling   Wheelchair Mobility    Modified Rankin (Stroke Patients Only)       Balance Overall balance assessment: Needs assistance   Sitting balance-Leahy Scale: Good     Standing balance support: Bilateral upper extremity supported Standing balance-Leahy Scale: Poor Standing balance comment: relies on BUE support                            Cognition Arousal/Alertness: Awake/alert Behavior During Therapy: WFL for tasks assessed/performed Overall Cognitive Status: Within Functional Limits for tasks assessed                                        Exercises Total Joint Exercises Ankle Circles/Pumps: AROM;Both;10 reps;Supine Quad Sets: AROM;Left;5 reps;Supine Short Arc Quad: AROM;Left;10 reps;Supine Heel Slides: AAROM;Left;10 reps;Supine Hip ABduction/ADduction: AROM;Left;10 reps;Supine Straight Leg Raises: Left;AROM;5 reps;Supine(x1 rep) Long Arc Quad: AROM;Left;10 reps;Seated Knee Flexion: AAROM;Left;10 reps;Seated Goniometric ROM: 5-80* AAROM L knee    General Comments        Pertinent Vitals/Pain Pain Score: 7  Pain Location: L knee Pain Descriptors / Indicators: Sore Pain Intervention(s): Limited activity within patient's tolerance;Monitored during session;Premedicated before session;Ice applied    Home Living                      Prior Function  PT Goals (current goals can now be found in the care plan section) Acute Rehab PT Goals Patient Stated Goal: return to water aerobics, walk without pain PT Goal Formulation: With patient Time For Goal Achievement: 08/17/18 Potential to Achieve Goals: Good Progress towards PT goals: Progressing toward goals    Frequency    7X/week      PT Plan Current plan remains  appropriate    Co-evaluation              AM-PAC PT "6 Clicks" Mobility   Outcome Measure  Help needed turning from your back to your side while in a flat bed without using bedrails?: None Help needed moving from lying on your back to sitting on the side of a flat bed without using bedrails?: None Help needed moving to and from a bed to a chair (including a wheelchair)?: None Help needed standing up from a chair using your arms (e.g., wheelchair or bedside chair)?: None Help needed to walk in hospital room?: None Help needed climbing 3-5 steps with a railing? : A Little 6 Click Score: 23    End of Session Equipment Utilized During Treatment: Gait belt Activity Tolerance: Patient tolerated treatment well Patient left: in chair;with call bell/phone within reach;with chair alarm set Nurse Communication: Mobility status PT Visit Diagnosis: Muscle weakness (generalized) (M62.81);Pain Pain - Right/Left: Left Pain - part of body: Knee     Time: 1423-9532 PT Time Calculation (min) (ACUTE ONLY): 50 min  Charges:  $Gait Training: 8-22 mins $Therapeutic Exercise: 8-22 mins $Therapeutic Activity: 8-22 mins                     Blondell Reveal Kistler PT 08/11/2018  Acute Rehabilitation Services Pager [phone] Office [phone]

## 2018-08-15 DIAGNOSIS — M19079 Primary osteoarthritis, unspecified ankle and foot: Secondary | ICD-10-CM | POA: Diagnosis not present

## 2018-08-15 DIAGNOSIS — E785 Hyperlipidemia, unspecified: Secondary | ICD-10-CM | POA: Diagnosis not present

## 2018-08-15 DIAGNOSIS — J4520 Mild intermittent asthma, uncomplicated: Secondary | ICD-10-CM | POA: Diagnosis not present

## 2018-08-15 DIAGNOSIS — I5042 Chronic combined systolic (congestive) and diastolic (congestive) heart failure: Secondary | ICD-10-CM | POA: Diagnosis not present

## 2018-08-15 DIAGNOSIS — G4700 Insomnia, unspecified: Secondary | ICD-10-CM | POA: Diagnosis not present

## 2018-08-15 DIAGNOSIS — I110 Hypertensive heart disease with heart failure: Secondary | ICD-10-CM | POA: Diagnosis not present

## 2018-08-15 DIAGNOSIS — Z471 Aftercare following joint replacement surgery: Secondary | ICD-10-CM | POA: Diagnosis not present

## 2018-08-15 DIAGNOSIS — Z96652 Presence of left artificial knee joint: Secondary | ICD-10-CM | POA: Diagnosis not present

## 2018-08-15 DIAGNOSIS — E559 Vitamin D deficiency, unspecified: Secondary | ICD-10-CM | POA: Diagnosis not present

## 2018-08-15 DIAGNOSIS — E119 Type 2 diabetes mellitus without complications: Secondary | ICD-10-CM | POA: Diagnosis not present

## 2018-08-18 DIAGNOSIS — I110 Hypertensive heart disease with heart failure: Secondary | ICD-10-CM | POA: Diagnosis not present

## 2018-08-18 DIAGNOSIS — M19079 Primary osteoarthritis, unspecified ankle and foot: Secondary | ICD-10-CM | POA: Diagnosis not present

## 2018-08-18 DIAGNOSIS — Z471 Aftercare following joint replacement surgery: Secondary | ICD-10-CM | POA: Diagnosis not present

## 2018-08-18 DIAGNOSIS — E559 Vitamin D deficiency, unspecified: Secondary | ICD-10-CM | POA: Diagnosis not present

## 2018-08-18 DIAGNOSIS — E119 Type 2 diabetes mellitus without complications: Secondary | ICD-10-CM | POA: Diagnosis not present

## 2018-08-18 DIAGNOSIS — G4700 Insomnia, unspecified: Secondary | ICD-10-CM | POA: Diagnosis not present

## 2018-08-18 DIAGNOSIS — I5042 Chronic combined systolic (congestive) and diastolic (congestive) heart failure: Secondary | ICD-10-CM | POA: Diagnosis not present

## 2018-08-18 DIAGNOSIS — E785 Hyperlipidemia, unspecified: Secondary | ICD-10-CM | POA: Diagnosis not present

## 2018-08-18 DIAGNOSIS — J4520 Mild intermittent asthma, uncomplicated: Secondary | ICD-10-CM | POA: Diagnosis not present

## 2018-08-19 ENCOUNTER — Telehealth: Payer: Self-pay

## 2018-08-19 ENCOUNTER — Other Ambulatory Visit: Payer: Self-pay

## 2018-08-19 DIAGNOSIS — E785 Hyperlipidemia, unspecified: Secondary | ICD-10-CM | POA: Diagnosis not present

## 2018-08-19 DIAGNOSIS — E559 Vitamin D deficiency, unspecified: Secondary | ICD-10-CM | POA: Diagnosis not present

## 2018-08-19 DIAGNOSIS — I5042 Chronic combined systolic (congestive) and diastolic (congestive) heart failure: Secondary | ICD-10-CM | POA: Diagnosis not present

## 2018-08-19 DIAGNOSIS — I110 Hypertensive heart disease with heart failure: Secondary | ICD-10-CM | POA: Diagnosis not present

## 2018-08-19 DIAGNOSIS — J4520 Mild intermittent asthma, uncomplicated: Secondary | ICD-10-CM | POA: Diagnosis not present

## 2018-08-19 DIAGNOSIS — M19079 Primary osteoarthritis, unspecified ankle and foot: Secondary | ICD-10-CM | POA: Diagnosis not present

## 2018-08-19 DIAGNOSIS — G4700 Insomnia, unspecified: Secondary | ICD-10-CM | POA: Diagnosis not present

## 2018-08-19 DIAGNOSIS — Z471 Aftercare following joint replacement surgery: Secondary | ICD-10-CM | POA: Diagnosis not present

## 2018-08-19 DIAGNOSIS — E119 Type 2 diabetes mellitus without complications: Secondary | ICD-10-CM | POA: Diagnosis not present

## 2018-08-19 MED ORDER — BREO ELLIPTA 100-25 MCG/INH IN AEPB: 1.0000 | INHALATION_SPRAY | Freq: Every day | RESPIRATORY_TRACT | 1 refills | Status: DC

## 2018-08-19 NOTE — Telephone Encounter (Signed)
pls send in script

## 2018-08-19 NOTE — Telephone Encounter (Signed)
Pt called and stated she got samples of Breo from the office before but she wants to know if she can get a prescription sent to her Pharmacy. She is using Breo Ellipta 16mcg/25mcg

## 2018-08-19 NOTE — Telephone Encounter (Signed)
Patient notified rx sent to pharmacy

## 2018-08-21 ENCOUNTER — Telehealth: Payer: Self-pay | Admitting: Medical

## 2018-08-21 DIAGNOSIS — E119 Type 2 diabetes mellitus without complications: Secondary | ICD-10-CM | POA: Diagnosis not present

## 2018-08-21 DIAGNOSIS — J4520 Mild intermittent asthma, uncomplicated: Secondary | ICD-10-CM

## 2018-08-21 DIAGNOSIS — E785 Hyperlipidemia, unspecified: Secondary | ICD-10-CM | POA: Diagnosis not present

## 2018-08-21 DIAGNOSIS — M19079 Primary osteoarthritis, unspecified ankle and foot: Secondary | ICD-10-CM | POA: Diagnosis not present

## 2018-08-21 DIAGNOSIS — E559 Vitamin D deficiency, unspecified: Secondary | ICD-10-CM | POA: Diagnosis not present

## 2018-08-21 DIAGNOSIS — Z471 Aftercare following joint replacement surgery: Secondary | ICD-10-CM | POA: Diagnosis not present

## 2018-08-21 DIAGNOSIS — I5042 Chronic combined systolic (congestive) and diastolic (congestive) heart failure: Secondary | ICD-10-CM | POA: Diagnosis not present

## 2018-08-21 DIAGNOSIS — G4700 Insomnia, unspecified: Secondary | ICD-10-CM | POA: Diagnosis not present

## 2018-08-21 DIAGNOSIS — I110 Hypertensive heart disease with heart failure: Secondary | ICD-10-CM | POA: Diagnosis not present

## 2018-08-21 MED ORDER — BREO ELLIPTA 100-25 MCG/INH IN AEPB: 1.0000 | INHALATION_SPRAY | Freq: Every day | RESPIRATORY_TRACT | 1 refills | Status: DC

## 2018-08-21 NOTE — Telephone Encounter (Signed)
  Needs rx Breo sent to her pharmacy Out of sample

## 2018-08-21 NOTE — Telephone Encounter (Signed)
Pt Breo was sent as samples. breo is being sent to the pharmacy.

## 2018-08-24 DIAGNOSIS — E785 Hyperlipidemia, unspecified: Secondary | ICD-10-CM | POA: Diagnosis not present

## 2018-08-24 DIAGNOSIS — I110 Hypertensive heart disease with heart failure: Secondary | ICD-10-CM | POA: Diagnosis not present

## 2018-08-24 DIAGNOSIS — M19079 Primary osteoarthritis, unspecified ankle and foot: Secondary | ICD-10-CM | POA: Diagnosis not present

## 2018-08-24 DIAGNOSIS — G4700 Insomnia, unspecified: Secondary | ICD-10-CM | POA: Diagnosis not present

## 2018-08-24 DIAGNOSIS — E559 Vitamin D deficiency, unspecified: Secondary | ICD-10-CM | POA: Diagnosis not present

## 2018-08-24 DIAGNOSIS — Z471 Aftercare following joint replacement surgery: Secondary | ICD-10-CM | POA: Diagnosis not present

## 2018-08-24 DIAGNOSIS — I5042 Chronic combined systolic (congestive) and diastolic (congestive) heart failure: Secondary | ICD-10-CM | POA: Diagnosis not present

## 2018-08-24 DIAGNOSIS — E119 Type 2 diabetes mellitus without complications: Secondary | ICD-10-CM | POA: Diagnosis not present

## 2018-08-24 DIAGNOSIS — J4520 Mild intermittent asthma, uncomplicated: Secondary | ICD-10-CM | POA: Diagnosis not present

## 2018-08-25 DIAGNOSIS — Z9889 Other specified postprocedural states: Secondary | ICD-10-CM | POA: Diagnosis not present

## 2018-08-25 DIAGNOSIS — M1712 Unilateral primary osteoarthritis, left knee: Secondary | ICD-10-CM | POA: Diagnosis not present

## 2018-08-26 DIAGNOSIS — M25662 Stiffness of left knee, not elsewhere classified: Secondary | ICD-10-CM | POA: Diagnosis not present

## 2018-08-26 DIAGNOSIS — M6281 Muscle weakness (generalized): Secondary | ICD-10-CM | POA: Diagnosis not present

## 2018-08-26 DIAGNOSIS — Z96652 Presence of left artificial knee joint: Secondary | ICD-10-CM | POA: Diagnosis not present

## 2018-08-31 DIAGNOSIS — M6281 Muscle weakness (generalized): Secondary | ICD-10-CM | POA: Diagnosis not present

## 2018-08-31 DIAGNOSIS — M25662 Stiffness of left knee, not elsewhere classified: Secondary | ICD-10-CM | POA: Diagnosis not present

## 2018-08-31 DIAGNOSIS — Z96652 Presence of left artificial knee joint: Secondary | ICD-10-CM | POA: Diagnosis not present

## 2018-09-01 ENCOUNTER — Ambulatory Visit: Payer: Medicare HMO | Admitting: Medical

## 2018-09-03 DIAGNOSIS — Z96652 Presence of left artificial knee joint: Secondary | ICD-10-CM | POA: Diagnosis not present

## 2018-09-03 DIAGNOSIS — M6281 Muscle weakness (generalized): Secondary | ICD-10-CM | POA: Diagnosis not present

## 2018-09-03 DIAGNOSIS — M25662 Stiffness of left knee, not elsewhere classified: Secondary | ICD-10-CM | POA: Diagnosis not present

## 2018-09-08 DIAGNOSIS — M6281 Muscle weakness (generalized): Secondary | ICD-10-CM | POA: Diagnosis not present

## 2018-09-08 DIAGNOSIS — M25662 Stiffness of left knee, not elsewhere classified: Secondary | ICD-10-CM | POA: Diagnosis not present

## 2018-09-08 DIAGNOSIS — Z96652 Presence of left artificial knee joint: Secondary | ICD-10-CM | POA: Diagnosis not present

## 2018-09-11 DIAGNOSIS — M25662 Stiffness of left knee, not elsewhere classified: Secondary | ICD-10-CM | POA: Diagnosis not present

## 2018-09-11 DIAGNOSIS — Z96652 Presence of left artificial knee joint: Secondary | ICD-10-CM | POA: Diagnosis not present

## 2018-09-11 DIAGNOSIS — M6281 Muscle weakness (generalized): Secondary | ICD-10-CM | POA: Diagnosis not present

## 2018-09-15 ENCOUNTER — Other Ambulatory Visit: Payer: Self-pay | Admitting: Medical

## 2018-09-15 DIAGNOSIS — M25662 Stiffness of left knee, not elsewhere classified: Secondary | ICD-10-CM | POA: Diagnosis not present

## 2018-09-15 DIAGNOSIS — Z96652 Presence of left artificial knee joint: Secondary | ICD-10-CM | POA: Diagnosis not present

## 2018-09-15 DIAGNOSIS — M6281 Muscle weakness (generalized): Secondary | ICD-10-CM | POA: Diagnosis not present

## 2018-09-15 DIAGNOSIS — Z1231 Encounter for screening mammogram for malignant neoplasm of breast: Secondary | ICD-10-CM

## 2018-09-17 DIAGNOSIS — M6281 Muscle weakness (generalized): Secondary | ICD-10-CM | POA: Diagnosis not present

## 2018-09-17 DIAGNOSIS — M25662 Stiffness of left knee, not elsewhere classified: Secondary | ICD-10-CM | POA: Diagnosis not present

## 2018-09-17 DIAGNOSIS — Z96652 Presence of left artificial knee joint: Secondary | ICD-10-CM | POA: Diagnosis not present

## 2018-09-22 DIAGNOSIS — M25662 Stiffness of left knee, not elsewhere classified: Secondary | ICD-10-CM | POA: Diagnosis not present

## 2018-09-22 DIAGNOSIS — Z9889 Other specified postprocedural states: Secondary | ICD-10-CM | POA: Diagnosis not present

## 2018-09-22 DIAGNOSIS — M6281 Muscle weakness (generalized): Secondary | ICD-10-CM | POA: Diagnosis not present

## 2018-09-22 DIAGNOSIS — Z96652 Presence of left artificial knee joint: Secondary | ICD-10-CM | POA: Diagnosis not present

## 2018-09-24 DIAGNOSIS — M6281 Muscle weakness (generalized): Secondary | ICD-10-CM | POA: Diagnosis not present

## 2018-09-24 DIAGNOSIS — M25662 Stiffness of left knee, not elsewhere classified: Secondary | ICD-10-CM | POA: Diagnosis not present

## 2018-09-24 DIAGNOSIS — Z96652 Presence of left artificial knee joint: Secondary | ICD-10-CM | POA: Diagnosis not present

## 2018-09-29 DIAGNOSIS — Z96652 Presence of left artificial knee joint: Secondary | ICD-10-CM | POA: Diagnosis not present

## 2018-09-29 DIAGNOSIS — M25662 Stiffness of left knee, not elsewhere classified: Secondary | ICD-10-CM | POA: Diagnosis not present

## 2018-09-29 DIAGNOSIS — M6281 Muscle weakness (generalized): Secondary | ICD-10-CM | POA: Diagnosis not present

## 2018-10-01 DIAGNOSIS — M6281 Muscle weakness (generalized): Secondary | ICD-10-CM | POA: Diagnosis not present

## 2018-10-01 DIAGNOSIS — M25662 Stiffness of left knee, not elsewhere classified: Secondary | ICD-10-CM | POA: Diagnosis not present

## 2018-10-01 DIAGNOSIS — Z96652 Presence of left artificial knee joint: Secondary | ICD-10-CM | POA: Diagnosis not present

## 2018-10-06 DIAGNOSIS — M25662 Stiffness of left knee, not elsewhere classified: Secondary | ICD-10-CM | POA: Diagnosis not present

## 2018-10-06 DIAGNOSIS — M6281 Muscle weakness (generalized): Secondary | ICD-10-CM | POA: Diagnosis not present

## 2018-10-06 DIAGNOSIS — Z96652 Presence of left artificial knee joint: Secondary | ICD-10-CM | POA: Diagnosis not present

## 2018-10-22 DIAGNOSIS — M25562 Pain in left knee: Secondary | ICD-10-CM | POA: Diagnosis not present

## 2018-10-27 ENCOUNTER — Telehealth: Payer: Self-pay | Admitting: Medical

## 2018-10-27 NOTE — Telephone Encounter (Signed)
Pt called and pt states that her jardiance was 200 dollars and she is wondering if there was something else she could take, pt would like it sent to the [facility] on [address], [place], [place] [address]

## 2018-10-28 NOTE — Telephone Encounter (Signed)
Have her call her insurance and see if the other options in that class are covered such as Catering manager, [place], Anastasio Auerbach

## 2018-10-28 NOTE — Telephone Encounter (Signed)
Pt is going to call the insurance and gave pt samples per shane since she was out until she can get this fixed

## 2018-10-29 ENCOUNTER — Ambulatory Visit
Admission: RE | Admit: 2018-10-29 | Discharge: 2018-10-29 | Disposition: A | Discharge status: Home / Self Care | Payer: Medicare HMO | Source: Ambulatory Visit | Attending: Medical | Admitting: Medical

## 2018-10-29 ENCOUNTER — Other Ambulatory Visit: Payer: Self-pay

## 2018-10-29 DIAGNOSIS — Z1231 Encounter for screening mammogram for malignant neoplasm of breast: Secondary | ICD-10-CM

## 2018-11-16 ENCOUNTER — Other Ambulatory Visit: Payer: Self-pay

## 2018-11-16 ENCOUNTER — Encounter: Payer: Self-pay | Admitting: Medical

## 2018-11-16 ENCOUNTER — Ambulatory Visit (INDEPENDENT_AMBULATORY_CARE_PROVIDER_SITE_OTHER): Payer: Medicare Other | Admitting: Medical

## 2018-11-16 VITALS — BP 138/76 | HR 81 | Temp 98.0°F | Ht 63.0 in | Wt 223.4 lb

## 2018-11-16 DIAGNOSIS — Z7189 Other specified counseling: Secondary | ICD-10-CM

## 2018-11-16 DIAGNOSIS — E559 Vitamin D deficiency, unspecified: Secondary | ICD-10-CM | POA: Diagnosis not present

## 2018-11-16 DIAGNOSIS — Z23 Encounter for immunization: Secondary | ICD-10-CM | POA: Diagnosis not present

## 2018-11-16 DIAGNOSIS — Z96652 Presence of left artificial knee joint: Secondary | ICD-10-CM

## 2018-11-16 DIAGNOSIS — R911 Solitary pulmonary nodule: Secondary | ICD-10-CM

## 2018-11-16 DIAGNOSIS — M19079 Primary osteoarthritis, unspecified ankle and foot: Secondary | ICD-10-CM

## 2018-11-16 DIAGNOSIS — M1712 Unilateral primary osteoarthritis, left knee: Secondary | ICD-10-CM

## 2018-11-16 DIAGNOSIS — J4520 Mild intermittent asthma, uncomplicated: Secondary | ICD-10-CM | POA: Diagnosis not present

## 2018-11-16 DIAGNOSIS — E2839 Other primary ovarian failure: Secondary | ICD-10-CM

## 2018-11-16 DIAGNOSIS — I10 Essential (primary) hypertension: Secondary | ICD-10-CM

## 2018-11-16 DIAGNOSIS — E118 Type 2 diabetes mellitus with unspecified complications: Secondary | ICD-10-CM

## 2018-11-16 DIAGNOSIS — D539 Nutritional anemia, unspecified: Secondary | ICD-10-CM

## 2018-11-16 DIAGNOSIS — N952 Postmenopausal atrophic vaginitis: Secondary | ICD-10-CM

## 2018-11-16 DIAGNOSIS — I5042 Chronic combined systolic (congestive) and diastolic (congestive) heart failure: Secondary | ICD-10-CM

## 2018-11-16 DIAGNOSIS — Z0000 Encounter for general adult medical examination without abnormal findings: Secondary | ICD-10-CM | POA: Diagnosis not present

## 2018-11-16 DIAGNOSIS — Z7185 Encounter for immunization safety counseling: Secondary | ICD-10-CM

## 2018-11-16 DIAGNOSIS — E785 Hyperlipidemia, unspecified: Secondary | ICD-10-CM

## 2018-11-16 DIAGNOSIS — Z8781 Personal history of (healed) traumatic fracture: Secondary | ICD-10-CM

## 2018-11-16 NOTE — Progress Notes (Addendum)
Subjective:    Darlene Wade is a 63 y.o. female who presents for Preventative Services visit and chronic medical problems/med check visit.    Primary Care Provider Tysinger, Camelia Eng, PA-C here for primary care  Current Health Care Team:  Dentist, none  Eye doctor, [facility]    Dr. Frederik Pear, ortho  Dr. Mertie Moores and Richardson Dopp, PA-C, cardiology  Dr. Wilfrid Lund, GI  Dr. Donnie Mesa, general surgery   Medical Services you may have received from other than [facility] providers in the past year (date may be approximate) Orthopedics, Dr. Mayer Camel  Exercise Current exercise habits: walk 3 times a weak for at least 20 minutes  Nutrition/Diet Current diet: in general, an "unhealthy" diet  Depression Screen Depression screen [facility] 2/9 11/16/2018  Decreased Interest 0  Down, Depressed, Hopeless 0  PHQ - 2 Score 0  Altered sleeping -  Tired, decreased energy -  Change in appetite -  Feeling bad or failure about yourself  -  Trouble concentrating -  Moving slowly or fidgety/restless -  Suicidal thoughts -  PHQ-9 Score -    Activities of Daily Living Screen/Functional Status Survey Is the patient deaf or have difficulty hearing?: No Does the patient have difficulty seeing, even when wearing glasses/contacts?: No Does the patient have difficulty concentrating, remembering, or making decisions?: No Does the patient have difficulty walking or climbing stairs?: Yes(had knee surgery) Does the patient have difficulty dressing or bathing?: No Does the patient have difficulty doing errands alone such as visiting a doctor's office or shopping?: No  Can patient draw a clock face showing 3:15 oclock, yes  Fall Risk Screen Fall Risk  11/16/2018 11/13/2017 05/22/2016  Falls in the past year? 0 No No    Gait Assessment: Normal gait observed yes  Advanced directives Does patient have a [place]? No Does patient have a Living Will? No  Past Medical  History:  Diagnosis Date  . Asthma 07/20/2018   one puff per day  . Chronic combined systolic and diastolic heart failure ([place]) 03/03/2018   Echo 07/2018: EF 40-45, diff HK worse in Inf base, normal RVSF  . Diabetes mellitus without complication ([place]) 8270  . Diabetic eye exam [facility])    [facility]  . Dyspnea   . Elbow fracture, right 2007  . Former smoker    20 pack year history, quit 2010  . Hyperlipidemia   . Hypertension   . Insomnia   . Lung nodule    Chest CT 07/2018:  RLL nodule resolved.  3 mm subpleural LUL nodule.  Repeat in 1 year if high risk.   . Obesity   . PONV (postoperative nausea and vomiting) 2014   1 time  . Wears glasses    reading    Past Surgical History:  Procedure Laterality Date  . BREAST CYST ASPIRATION  2015  . BREAST EXCISIONAL BIOPSY Left   . BREAST LUMPECTOMY WITH NEEDLE LOCALIZATION Left 10/07/2012   Procedure: BREAST LUMPECTOMY WITH NEEDLE LOCALIZATION;  Surgeon: Imogene Burn. Georgette Dover, MD;  Location: [facility];  Service: General;  Laterality: Left;  . BREAST SURGERY    . COLONOSCOPY  01/2016   01/2016 with Dr. Loletha Carrow, tubular adenoma polpy; 2003 with Dr. Collene Mares  . ELBOW ARTHROPLASTY  2006   rt-fx  . FOOT ARTHROTOMY  11/12   foot fusion, right  . FOOT MASS EXCISION  3/14   left-fusion  . PARTIAL HYSTERECTOMY  age 64  uterine fibroids, still has ovaries  . TONSILLECTOMY    . TOTAL KNEE ARTHROPLASTY Left 08/10/2018   Procedure: Left Knee Arthroplasty;  Surgeon: Frederik Pear, MD;  Location: [facility];  Service: Orthopedics;  Laterality: Left;    Social History   Socioeconomic History  . Marital status: Married    Spouse name: Not on file  . Number of children: Not on file  . Years of education: Not on file  . Highest education level: Not on file  Occupational History  . Not on file  Social Needs  . Financial resource strain: Not on file  . Food insecurity    Worry: Not on file    Inability: Not on file  . Transportation needs     Medical: Not on file    Non-medical: Not on file  Tobacco Use  . Smoking status: Former Smoker    Packs/day: 1.00    Years: 20.00    Pack years: 20.00    Quit date: 04/11/2009    Years since quitting: 9.6  . Smokeless tobacco: Never Used  Substance and Sexual Activity  . Alcohol use: Yes    Alcohol/week: 1.0 standard drinks    Types: 1 Glasses of wine per week    Comment: rare  . Drug use: No  . Sexual activity: Not on file  Lifestyle  . Physical activity    Days per week: Not on file    Minutes per session: Not on file  . Stress: Not on file  Relationships  . Social Herbalist on phone: Not on file    Gets together: Not on file    Attends religious service: Not on file    Active member of club or organization: Not on file    Attends meetings of clubs or organizations: Not on file    Relationship status: Not on file  . Intimate partner violence    Fear of current or ex partner: Not on file    Emotionally abused: Not on file    Physically abused: Not on file    Forced sexual activity: Not on file  Other Topics Concern  . Not on file  Social History Narrative   Separated from husband (2013). Living with husband.   No significant other.  Walking for exercise 2-3 x per week.  Retired, Set designer in the past.  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 Diabetes Mother   . Hypertension Mother   . Diabetes Maternal Aunt   . Heart disease Maternal Grandfather   . Cancer Neg Hx   . Breast cancer Neg Hx      Current Outpatient Medications:  .  aspirin EC 81 MG tablet, Take 1 tablet (81 mg total) by mouth 2 (two) times daily., Disp: 60 tablet, Rfl: 0 .  atorvastatin (LIPITOR) 40 MG tablet, TAKE 1 TABLET EVERY DAY (Patient taking differently: Take 20 mg by mouth daily. ), Disp: 90 tablet, Rfl: 1 .  Blood Glucose Monitoring Suppl (ACCU-CHEK AVIVA PLUS) w/Device KIT, Test blood sugar 2  times daily, Disp: 1 kit, Rfl: 0 .  carvedilol (COREG) 3.125 MG tablet, Take 1 tablet (3.125 mg total) by mouth 2 (two) times daily., Disp: 180 tablet, Rfl: 3 .  celecoxib (CELEBREX) 200 MG capsule, TK 1 C PO BID, Disp: , Rfl:  .  cholecalciferol (VITAMIN D3) 25 MCG (1000 UT) tablet, Take 2  tablets (2,000 Units total) by mouth daily., Disp: 180 tablet, Rfl: 3 .  empagliflozin (JARDIANCE) 10 MG TABS tablet, Take 10 mg by mouth daily., Disp: 90 tablet, Rfl: 3 .  fluticasone furoate-vilanterol (BREO ELLIPTA) 100-25 MCG/INH AEPB, Inhale 1 puff into the lungs daily., Disp: 1 each, Rfl: 1 .  gabapentin (NEURONTIN) 300 MG capsule, TK 1 C PO TID, Disp: , Rfl:  .  losartan (COZAAR) 50 MG tablet, Take 1 tablet (50 mg total) by mouth daily., Disp: 90 tablet, Rfl: 3 .  metFORMIN (GLUCOPHAGE) 1000 MG tablet, Take 1 tablet (1,000 mg total) by mouth 2 (two) times daily with a meal. (Patient taking differently: Take 1,000 mg by mouth daily with breakfast. ), Disp: 180 tablet, Rfl: 3 .  Multiple Vitamin (MULTIVITAMIN) tablet, Take 1 tablet by mouth daily., Disp: , Rfl:  .  oxyCODONE-acetaminophen (PERCOCET/ROXICET) 5-325 MG tablet, Take 1 tablet by mouth every 4 (four) hours as needed for severe pain. (Patient not taking: Reported on 11/16/2018), Disp: 30 tablet, Rfl: 0 .  tiZANidine (ZANAFLEX) 2 MG tablet, Take 1 tablet (2 mg total) by mouth every 6 (six) hours as needed. (Patient not taking: Reported on 11/16/2018), Disp: 60 tablet, Rfl: 0  Current Facility-Administered Medications:  .  0.9 %  sodium chloride infusion, 500 mL, Intravenous, Continuous, Danis, Kirke Corin, MD  Allergies  Allergen Reactions  . Oxycodone Nausea And Vomiting  . Naproxen Nausea Only  . Vicodin [Hydrocodone-Acetaminophen] Nausea Only    History reviewed: allergies, current medications, past family history, past medical history, past social history, past surgical history and problem list  Chronic issues discussed: Since last visit  had TKR left knee.  Has finished PT, but has been a rough road.  Still needs other knee replaced, but still trying to get over the prior surgery.  For 2 months had 10/10 pain daily.  Now has 5/10 pain in left knee, unless standing for too long.   Saw Dr. Mayer Camel.    Hyperlipidemia - compliant with statin, no c/o.  Hx/o heart failure, HTN  - no recent edema, SOB, wheezing, compliant with Coreg BID, Losartan 94m daily  Vit D deficiency - compliant with Vit D 2000 u daily  Diabetes - compliant with Jardiance 149mdaily, Metformin 100047mID,   She does try to be really careful not to pick up COVID since she is high risk.  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 Yes  Normal Appearance?Yes  Oriented to person? Yes  Place? Yes   Time? Yes  Recall of three objects?  Yes  Can perform simple calculations? Yes  Displays appropriate judgment?Yes  Can read the correct time from a watch face?Yes  General appearance: alert, no distress, WD/WN, AA female  Nutritional Status: Inadequate calore intake? no Loss of muscle mass? no Loss of fat beneath skin? no Localized or general edema? no Diminished functional status? no  Other pertinent exam: Neck: supple, no lymphadenopathy, no thyromegaly, no masses, no bruits Heart: RRR, normal S1, S2, no murmurs Lungs: CTA bilaterally, no wheezes, rhonchi, or rales Abdomen: +bs, soft, non tender,  non distended, no masses, no hepatomegaly, no splenomegaly Musculoskeletal: Surgical scar left knee from recent total knee replacement, surgical scars right foot, nontender, no swelling, no obvious deformity Extremities: no edema, no cyanosis, no clubbing Pulses: 2+ symmetric, upper and lower extremities, normal cap refill Neurological: alert, oriented x 3, CN2-12 intact, strength normal upper extremities and lower extremities, sensation normal throughout, DTRs 2+ throughout, no cerebellar signs, gait normal Psychiatric: normal affect, behavior normal, pleasant  Breast: No nodules no lymphadenopathy no skin changes, scarring of left breast at 7:00 from prior surgery GU: Atrophic external changes mild, no obvious lesions with speculum exam, no abnormal discharge, no adnexal mass or tenderness, exam unremarkable, exam chaperoned by nurse   Assessment:   Encounter Diagnoses  Name Primary?  . Encounter for health maintenance examination in adult Yes  . Medicare annual wellness visit, subsequent   . Essential hypertension, benign   . Mild intermittent asthma without complication   . Diabetes mellitus with complication ([place])   . Osteoarthritis of foot, unspecified laterality, unspecified osteoarthritis type   . Primary osteoarthritis of left knee   . Atrophic vaginitis   . S/P total knee arthroplasty, left   . Lung nodule   . History of fracture   . Estrogen deficiency   . Vitamin D deficiency   . Nutritional anemia, unspecified   . Morbid obesity ([place])   . Vaccine counseling   . Need for influenza vaccination   . Hyperlipidemia, unspecified hyperlipidemia type   . Chronic combined systolic and diastolic heart failure ([place])      Plan:   A preventative services visit was completed today.  During the course of the visit today, we discussed and counseled about appropriate screening and preventive services.  A health risk assessment was established today that included a review of current  medications, allergies, social history, family history, medical and preventative health history, biometrics, and preventative screenings to identify potential safety concerns or impairments.  A personalized plan was printed today for your records and use.   Personalized health advice and education was given today to reduce health risks and promote self management and wellness.  Information regarding end of life planning was discussed today.  Conditions/risks identified: Ongoing left knee pain., s/p recent TKR Hx/o heart failure  Chronic problems discussed today: Diabetes -continue current medications, continue glucometer testing, advise yearly eye doctor and dentist visits for wellness and diabetic retinopathy screening  hyperlipidemia - reviewed up to date lipid panel, c/t statin, aspirin  Hx/ heart failure - discussed importance of symptom awareness, daily weights, limiting salt  HTN - continue current medications  Vitamin D deficiency-continue supplements, labs today   Acute problems discussed today: Knee pain - follow up with orthopedics  Recommendations:  I recommend a yearly ophthalmology/optometry visit for glaucoma screening and eye checkup  I recommended a yearly dental visit for hygiene and checkup  Advanced directives - discussed nature and purpose of Advanced Directives, encouraged them to complete them if they have not done so and/or encouraged them to get [place] a copy if they have done this already.  Referrals today: none  Immunizations: I recommended a yearly influenza  vaccine, typically in September when the vaccine is usually available Is the Pneumococcal vaccine up to date: yes. Up to date on Td vaccine  Shingles vaccine:  I recommend you have a shingles vaccine to help prevent shingles or herpes zoster outbreak.   Please call your insurer to inquire about coverage for the Shingrix vaccine given in 2 doses.   Some insurers cover this vaccine after age 67, some  cover this after age 21.  If your insurer covers this, then call to schedule appointment to have this vaccine here.  Counseled on the influenza virus vaccine.  Vaccine information sheet given.  High Dose Influenza vaccine given after consent obtained.  Else was seen today for medicare wellness.  Diagnoses and all orders for this visit:  Encounter for health maintenance examination in adult -     Comprehensive metabolic panel -     CBC with Differential/Platelet -     TSH -     VITAMIN D 25 Hydroxy (Vit-D Deficiency, Fractures) -     Hemoglobin A1c  Medicare annual wellness visit, subsequent  Essential hypertension, benign -     Comprehensive metabolic panel -     CBC with Differential/Platelet  Mild intermittent asthma without complication  Diabetes mellitus with complication (HCC) -     Comprehensive metabolic panel -     CBC with Differential/Platelet -     TSH -     Hemoglobin A1c  Osteoarthritis of foot, unspecified laterality, unspecified osteoarthritis type  Primary osteoarthritis of left knee  Atrophic vaginitis  S/P total knee arthroplasty, left  Lung nodule  History of fracture  Estrogen deficiency -     TSH -     VITAMIN D 25 Hydroxy (Vit-D Deficiency, Fractures)  Vitamin D deficiency -     VITAMIN D 25 Hydroxy (Vit-D Deficiency, Fractures)  Nutritional anemia, unspecified -     CBC with Differential/Platelet  Morbid obesity (HCC)  Vaccine counseling -     Flu Vaccine QUAD High Dose(Fluad)  Need for influenza vaccination  Hyperlipidemia, unspecified hyperlipidemia type  Chronic combined systolic and diastolic heart failure ([place])      Medicare Attestation A preventative services visit was completed today.  During the course of the visit the patient was educated and counseled about appropriate screening and preventive services.  A health risk assessment was established with the patient that included a review of current medications,  allergies, social history, family history, medical and preventative health history, biometrics, and preventative screenings to identify potential safety concerns or impairments.  A personalized plan was printed today for the patient's records and use.   Personalized health advice and education was given today to reduce health risks and promote self management and wellness.  Information regarding end of life planning was discussed today.  Dorothea Ogle, PA-C   11/16/2018

## 2018-11-16 NOTE — Patient Instructions (Addendum)
Thanks for trusting [place] with your health care and for coming in for a physical today.  Below are some general recommendations I have for you:  Yearly screenings See your eye doctor yearly for routine vision care. See your dentist yearly for routine dental care including hygiene visits twice yearly. See me here yearly for a routine physical and preventative care visit   Cancer screening Continue yearly mammogram Your last colonoscopy was 2017, and you will likely be due again in 2022 check with gastroenterology  Bone density screening -your scan was normal in 2018.  You can repeat this test in the next 1-2 years.  Specific Concerns today:  . Work on getting exercise such as elliptical, stationary bike, rowing machine, or swimming, nonimpact exercise to limit pain to the knees . I recommend a low-carb diet such as [place] to help blood sugars and to help lose weight . Continue your current medications . Follow-up with orthopedics about your ongoing knee pain . Follow-up with cardiology yearly.  Weight yourself daily and watch for any weight gain over 5 pounds in a given week, and if symptoms of worsening fatigue or shortness of breath or swelling in the legs then let us or cardiology know ASAP . If you have not done a living will and healthcare power of attorney, please work on these and get [place] a copy   Please follow up yearly for a physical.    I have included other useful information below for your review.  Preventative Care for Adults - Female      MAINTAIN REGULAR HEALTH EXAMS:  A routine yearly physical is a good way to check in with your primary care provider about your health and preventive screening. It is also an opportunity to share updates about your health and any concerns you have, and receive a thorough all-over exam.   Most health insurance companies pay for at least some preventative services.  Check with your health plan for specific coverages.  WHAT  PREVENTATIVE SERVICES DO WOMEN NEED?  Adult women should have their weight and blood pressure checked regularly.   Women age 80 and older should have their cholesterol levels checked regularly.  Women should be screened for cervical cancer with a Pap smear and pelvic exam beginning at either age 60, or 3 years after they become sexually activity.    Breast cancer screening generally begins at age 33 with a mammogram and breast exam by your primary care provider.    Beginning at age 17 and continuing to age 60, women should be screened for colorectal cancer.  Certain people may need continued testing until age 66.  Updating vaccinations is part of preventative care.  Vaccinations help protect against diseases such as the flu.  Osteoporosis is a disease in which the bones lose minerals and strength as we age. Women ages 105 and over should discuss this with their caregivers, as should women after menopause who have other risk factors.  Lab tests are generally done as part of preventative care to screen for anemia and blood disorders, to screen for problems with the kidneys and liver, to screen for bladder problems, to check blood sugar, and to check your cholesterol level.  Preventative services generally include counseling about diet, exercise, avoiding tobacco, drugs, excessive alcohol consumption, and sexually transmitted infections.    GENERAL RECOMMENDATIONS FOR GOOD HEALTH:  Healthy diet:  Eat a variety of foods, including fruit, vegetables, animal or vegetable protein, such as meat, fish, chicken, and  eggs, or beans, lentils, tofu, and grains, such as rice.  Drink plenty of water daily.  Decrease saturated fat in the diet, avoid lots of red meat, processed foods, sweets, fast foods, and fried foods.  Exercise:  Aerobic exercise helps maintain good heart health. At least 30-40 minutes of moderate-intensity exercise is recommended. For example, a brisk walk that increases your heart  rate and breathing. This should be done on most days of the week.   Find a type of exercise or a variety of exercises that you enjoy so that it becomes a part of your daily life.  Examples are running, walking, swimming, water aerobics, and biking.  For motivation and support, explore group exercise such as aerobic class, spin class, Zumba, Yoga,or  martial arts, etc.    Set exercise goals for yourself, such as a certain weight goal, walk or run in a race such as a 5k walk/run.  Speak to your primary care provider about exercise goals.  Disease prevention:  If you smoke or chew tobacco, find out from your caregiver how to quit. It can literally save your life, no matter how long you have been a tobacco user. If you do not use tobacco, never begin.   Maintain a healthy diet and normal weight. Increased weight leads to problems with blood pressure and diabetes.   The Body Mass Index or BMI is a way of measuring how much of your body is fat. Having a BMI above 27 increases the risk of heart disease, diabetes, hypertension, stroke and other problems related to obesity. Your caregiver can help determine your BMI and based on it develop an exercise and dietary program to help you achieve or maintain this important measurement at a healthful level.  High blood pressure causes heart and blood vessel problems.  Persistent high blood pressure should be treated with medicine if weight loss and exercise do not work.   Fat and cholesterol leaves deposits in your arteries that can block them. This causes heart disease and vessel disease elsewhere in your body.  If your cholesterol is found to be high, or if you have heart disease or certain other medical conditions, then you may need to have your cholesterol monitored frequently and be treated with medication.   Ask if you should have a cardiac stress test if your history suggests this. A stress test is a test done on a treadmill that looks for heart disease.  This test can find disease prior to there being a problem.  Menopause can be associated with physical symptoms and risks. Hormone replacement therapy is available to decrease these. You should talk to your caregiver about whether starting or continuing to take hormones is right for you.   Osteoporosis is a disease in which the bones lose minerals and strength as we age. This can result in serious bone fractures. Risk of osteoporosis can be identified using a bone density scan. Women ages 7 and over should discuss this with their caregivers, as should women after menopause who have other risk factors. Ask your caregiver whether you should be taking a calcium supplement and Vitamin D, to reduce the rate of osteoporosis.   Avoid drinking alcohol in excess (more than two drinks per day).  Avoid use of street drugs. Do not share needles with anyone. Ask for professional help if you need assistance or instructions on stopping the use of alcohol, cigarettes, and/or drugs.  Brush your teeth twice a day with fluoride toothpaste, and floss once a  day. Good oral hygiene prevents tooth decay and gum disease. The problems can be painful, unattractive, and can cause other health problems. Visit your dentist for a routine oral and dental check up and preventive care every 6-12 months.   Look at your skin regularly.  Use a mirror to look at your back. Notify your caregivers of changes in moles, especially if there are changes in shapes, colors, a size larger than a pencil eraser, an irregular border, or development of new moles.  Safety:  Use seatbelts 100% of the time, whether driving or as a passenger.  Use safety devices such as hearing protection if you work in environments with loud noise or significant background noise.  Use safety glasses when doing any work that could send debris in to the eyes.  Use a helmet if you ride a bike or motorcycle.  Use appropriate safety gear for contact sports.  Talk to your  caregiver about gun safety.  Use sunscreen with a SPF (or skin protection factor) of 15 or greater.  Lighter skinned people are at a greater risk of skin cancer. Don't forget to also wear sunglasses in order to protect your eyes from too much damaging sunlight. Damaging sunlight can accelerate cataract formation.   Practice safe sex. Use condoms. Condoms are used for birth control and to help reduce the spread of sexually transmitted infections (or STIs).  Some of the STIs are gonorrhea (the clap), chlamydia, syphilis, trichomonas, herpes, HPV (human papilloma virus) and HIV (human immunodeficiency virus) which causes AIDS. The herpes, HIV and HPV are viral illnesses that have no cure. These can result in disability, cancer and death.   Keep carbon monoxide and smoke detectors in your home functioning at all times. Change the batteries every 6 months or use a model that plugs into the wall.   Vaccinations:  Stay up to date with your tetanus shots and other required immunizations. You should have a booster for tetanus every 10 years. Be sure to get your flu shot every year, since 5%-20% of the [place] population comes down with the flu. The flu vaccine changes each year, so being vaccinated once is not enough. Get your shot in the fall, before the flu season peaks.   Other vaccines to consider:  Human Papilloma Virus or HPV causes cancer of the cervix, and other infections that can be transmitted from person to person. There is a vaccine for HPV, and females should get immunized between the ages of 56 and 18. It requires a series of 3 shots.   Pneumococcal vaccine to protect against certain types of pneumonia.  This is normally recommended for adults age 20 or older.  However, adults younger than 65 years old with certain underlying conditions such as diabetes, heart or lung disease should also receive the vaccine.  Shingles vaccine to protect against Varicella Zoster if you are older than age 32, or  younger than 65 years old with certain underlying illness.  If you have not had the Shingrix vaccine, please call your insurer to inquire about coverage for the Shingrix vaccine given in 2 doses.   Some insurers cover this vaccine after age 20, some cover this after age 62.  If your insurer covers this, then call to schedule appointment to have this vaccine here  Hepatitis A vaccine to protect against a form of infection of the liver by a virus acquired from food.  Hepatitis B vaccine to protect against a form of infection of the liver by  a virus acquired from blood or body fluids, particularly if you work in health care.  If you plan to travel internationally, check with your local health department for specific vaccination recommendations.  Cancer Screening:  Breast cancer screening is essential to preventive care for women. All women age 60 and older should perform a breast self-exam every month. At age 30 and older, women should have their caregiver complete a breast exam each year. Women at ages 34 and older should have a mammogram (x-ray film) of the breasts. Your caregiver can discuss how often you need mammograms.    Cervical cancer screening includes taking a Pap smear (sample of cells examined under a microscope) from the cervix (end of the uterus). It also includes testing for HPV (Human Papilloma Virus, which can cause cervical cancer). Screening and a pelvic exam should begin at age 93, or 3 years after a woman becomes sexually active. Screening should occur every year, with a Pap smear but no HPV testing, up to age 39. After age 22, you should have a Pap smear every 3 years with HPV testing, if no HPV was found previously.   Most routine colon cancer screening begins at the age of 32. On a yearly basis, doctors may provide special easy to use take-home tests to check for hidden blood in the stool. Sigmoidoscopy or colonoscopy can detect the earliest forms of colon cancer and is life  saving. These tests use a small camera at the end of a tube to directly examine the colon. Speak to your caregiver about this at age 56, when routine screening begins (and is repeated every 5 years unless early forms of pre-cancerous polyps or small growths are found).    Advanced Directives  Advance Directive  Advance directives are legal documents that let you make choices ahead of time about your health care and medical treatment in case you become unable to communicate for yourself. Advance directives are a way for you to communicate your wishes to family, friends, and health care providers. This can help convey your decisions about end-of-life care if you become unable to communicate. Discussing and writing advance directives should happen over time rather than all at once. Advance directives can be changed depending on your situation and what you want, even after you have signed the advance directives. If you do not have an advance directive, some states assign family decision makers to act on your behalf based on how closely you are related to them. Each state has its own laws regarding advance directives. You may want to check with your health care provider, attorney, or state representative about the laws in your state. There are different types of advance directives, such as:  Medical power of attorney.  Living will.  Do not resuscitate (DNR) or do not attempt resuscitation (DNAR) order. Health care proxy and medical power of attorney A health care proxy, also called a health care agent, is a person who is appointed to make medical decisions for you in cases in which you are unable to make the decisions yourself. Generally, people choose someone they know well and trust to represent their preferences. Make sure to ask this person for an agreement to act as your proxy. A proxy may have to exercise judgment in the event of a medical decision for which your wishes are not known. A medical  power of attorney is a legal document that names your health care proxy. Depending on the laws in your state, after the document  is written, it may also need to be:  Signed.  Notarized.  Dated.  Copied.  Witnessed.  Incorporated into your medical record. You may also want to appoint someone to manage your financial affairs in a situation in which you are unable to do so. This is called a durable power of attorney for finances. It is a separate legal document from the durable power of attorney for health care. You may choose the same person or someone different from your health care proxy to act as your agent in financial matters. If you do not appoint a proxy, or if there is a concern that the proxy is not acting in your best interests, a court-appointed guardian may be designated to act on your behalf. Living will A living will is a set of instructions documenting your wishes about medical care when you cannot express them yourself. Health care providers should keep a copy of your living will in your medical record. You may want to give a copy to family members or friends. To alert caregivers in case of an emergency, you can place a card in your wallet to let them know that you have a living will and where they can find it. A living will is used if you become:  Terminally ill.  Incapacitated.  Unable to communicate or make decisions. Items to consider in your living will include:  The use or non-use of life-sustaining equipment, such as dialysis machines and breathing machines (ventilators).  A DNR or DNAR order, which is the instruction not to use cardiopulmonary resuscitation (CPR) if breathing or heartbeat stops.  The use or non-use of tube feeding.  Withholding of food and fluids.  Comfort (palliative) care when the goal becomes comfort rather than a cure.  Organ and tissue donation. A living will does not give instructions for distributing your money and property if you  should pass away. It is recommended that you seek the advice of a lawyer when writing a will. Decisions about taxes, beneficiaries, and asset distribution will be legally binding. This process can relieve your family and friends of any concerns surrounding disputes or questions that may come up about the distribution of your assets. DNR or DNAR A DNR or DNAR order is a request not to have CPR in the event that your heart stops beating or you stop breathing. If a DNR or DNAR order has not been made and shared, a health care provider will try to help any patient whose heart has stopped or who has stopped breathing. If you plan to have surgery, talk with your health care provider about how your DNR or DNAR order will be followed if problems occur. Summary  Advance directives are the legal documents that allow you to make choices ahead of time about your health care and medical treatment in case you become unable to communicate for yourself.  The process of discussing and writing advance directives should happen over time. You can change the advance directives, even after you have signed them.  Advance directives include DNR or DNAR orders, living wills, and designating an agent as your medical power of attorney. This information is not intended to replace advice given to you by your health care provider. Make sure you discuss any questions you have with your health care provider. Document Released: 05/07/2007 Document Revised: 03/04/2018 Document Reviewed: 12/18/2015 Elsevier Patient Education  2020 [employer].

## 2018-11-17 LAB — CBC WITH DIFFERENTIAL/PLATELET
Basophils Absolute: 0 10*3/uL (ref 0.0–0.2)
Basos: 1 %
EOS (ABSOLUTE): 0.3 10*3/uL (ref 0.0–0.4)
Eos: 4 %
Hematocrit: 40.5 % (ref 34.0–46.6)
Hemoglobin: 12.9 g/dL (ref 11.1–15.9)
Immature Grans (Abs): 0 10*3/uL (ref 0.0–0.1)
Immature Granulocytes: 1 %
Lymphocytes Absolute: 2.4 10*3/uL (ref 0.7–3.1)
Lymphs: 30 %
MCH: 27.2 pg (ref 26.6–33.0)
MCHC: 31.9 g/dL (ref 31.5–35.7)
MCV: 85 fL (ref 79–97)
Monocytes Absolute: 0.5 10*3/uL (ref 0.1–0.9)
Monocytes: 6 %
Neutrophils Absolute: 4.7 10*3/uL (ref 1.4–7.0)
Neutrophils: 58 %
Platelets: 299 10*3/uL (ref 150–450)
RBC: 4.75 x10E6/uL (ref 3.77–5.28)
RDW: 14.6 % (ref 11.7–15.4)
WBC: 7.9 10*3/uL (ref 3.4–10.8)

## 2018-11-17 LAB — COMPREHENSIVE METABOLIC PANEL
ALT: 14 IU/L (ref 0–32)
AST: 16 IU/L (ref 0–40)
Albumin/Globulin Ratio: 1.3 (ref 1.2–2.2)
Albumin: 4.2 g/dL (ref 3.8–4.8)
Alkaline Phosphatase: 127 IU/L — ABNORMAL HIGH (ref 39–117)
BUN/Creatinine Ratio: 17 (ref 12–28)
BUN: 14 mg/dL (ref 8–27)
Bilirubin Total: 0.4 mg/dL (ref 0.0–1.2)
CO2: 24 mmol/L (ref 20–29)
Calcium: 10 mg/dL (ref 8.7–10.3)
Chloride: 104 mmol/L (ref 96–106)
Creatinine, Ser: 0.81 mg/dL (ref 0.57–1.00)
GFR calc Af Amer: 88 mL/min/{1.73_m2} (ref >59)
GFR calc non Af Amer: 76 mL/min/{1.73_m2} (ref >59)
Globulin, Total: 3.2 g/dL (ref 1.5–4.5)
Glucose: 119 mg/dL — ABNORMAL HIGH (ref 65–99)
Potassium: 4 mmol/L (ref 3.5–5.2)
Sodium: 143 mmol/L (ref 134–144)
Total Protein: 7.4 g/dL (ref 6.0–8.5)

## 2018-11-17 LAB — HEMOGLOBIN A1C
Est. average glucose Bld gHb Est-mCnc: 154 mg/dL
Hgb A1c MFr Bld: 7 % — ABNORMAL HIGH (ref 4.8–5.6)

## 2018-11-17 LAB — TSH: TSH: 1.45 u[IU]/mL (ref 0.450–4.500)

## 2018-11-17 LAB — VITAMIN D 25 HYDROXY (VIT D DEFICIENCY, FRACTURES): Vit D, 25-Hydroxy: 41.3 ng/mL (ref 30.0–100.0)

## 2018-11-18 ENCOUNTER — Other Ambulatory Visit: Payer: Self-pay | Admitting: Medical

## 2018-11-18 MED ORDER — ATORVASTATIN CALCIUM 40 MG PO TABS: 40.0000 mg | ORAL_TABLET | Freq: Every day | ORAL | 3 refills | Status: DC

## 2018-11-18 MED ORDER — ASPIRIN EC 81 MG PO TBEC: 81.0000 mg | DELAYED_RELEASE_TABLET | Freq: Every day | ORAL | 3 refills | Status: DC

## 2018-11-19 ENCOUNTER — Other Ambulatory Visit: Payer: Self-pay | Admitting: Medical

## 2018-11-19 DIAGNOSIS — Z96652 Presence of left artificial knee joint: Secondary | ICD-10-CM | POA: Diagnosis not present

## 2018-11-25 LAB — ALKALINE PHOSPHATASE, ISOENZYMES
Alkaline Phosphatase: 121 IU/L — ABNORMAL HIGH (ref 39–117)
BONE FRACTION: 26 % (ref 14–68)
INTESTINAL FRAC.: 5 % (ref 0–18)
LIVER FRACTION: 69 % (ref 18–85)

## 2018-11-25 LAB — SPECIMEN STATUS REPORT

## 2018-11-30 ENCOUNTER — Telehealth: Payer: Self-pay | Admitting: Medical

## 2018-11-30 ENCOUNTER — Other Ambulatory Visit: Payer: Self-pay | Admitting: Medical

## 2018-11-30 ENCOUNTER — Telehealth: Payer: Self-pay | Admitting: Family Medicine

## 2018-11-30 MED ORDER — JARDIANCE 10 MG PO TABS: 10.0000 mg | ORAL_TABLET | Freq: Every day | ORAL | 0 refills | Status: DC

## 2018-11-30 MED ORDER — ATORVASTATIN CALCIUM 40 MG PO TABS: 40.0000 mg | ORAL_TABLET | Freq: Every day | ORAL | 2 refills | Status: DC

## 2018-11-30 MED ORDER — LOSARTAN POTASSIUM 50 MG PO TABS: 50.0000 mg | ORAL_TABLET | Freq: Every day | ORAL | 3 refills | Status: DC

## 2018-11-30 MED ORDER — ATORVASTATIN CALCIUM 40 MG PO TABS: 40.0000 mg | ORAL_TABLET | Freq: Every day | ORAL | 0 refills | Status: DC

## 2018-11-30 NOTE — Telephone Encounter (Signed)
Already spoke to patient.

## 2018-11-30 NOTE — Telephone Encounter (Signed)
[facility] sent refill request for Losartan and Atorvastatin please sent to [facility]

## 2018-11-30 NOTE — Telephone Encounter (Signed)
Pt called needs refill of Atorvastatin to [facility] on [address] and wants to know if she can get samples of Jardiance until she can get next month.

## 2018-11-30 NOTE — Telephone Encounter (Signed)
Pt was out of Atorvastatin so she requested a 30 day be sent to local [facility]. Additional refills for atorvastatin and Losartan was sent to [facility] as patient pharmacy recently changed. Patient also requested samples of jardiance. Was given about a month supply.

## 2018-12-23 ENCOUNTER — Ambulatory Visit (INDEPENDENT_AMBULATORY_CARE_PROVIDER_SITE_OTHER): Payer: Medicare Other | Admitting: Family Medicine

## 2018-12-23 ENCOUNTER — Encounter: Payer: Self-pay | Admitting: Family Medicine

## 2018-12-23 ENCOUNTER — Other Ambulatory Visit: Payer: Self-pay

## 2018-12-23 VITALS — BP 138/80 | HR 76 | Temp 96.2°F | Wt 223.2 lb

## 2018-12-23 DIAGNOSIS — B373 Candidiasis of vulva and vagina: Secondary | ICD-10-CM

## 2018-12-23 DIAGNOSIS — B3731 Acute candidiasis of vulva and vagina: Secondary | ICD-10-CM

## 2018-12-23 MED ORDER — FLUCONAZOLE 150 MG PO TABS: 150.0000 mg | ORAL_TABLET | Freq: Once | ORAL | 0 refills | Status: AC

## 2018-12-23 NOTE — Progress Notes (Signed)
   Subjective:    Patient ID: Darlene Wade, female    DOB: May 22, 1955, 63 y.o.   MRN: [id number]  HPI She complains of a several day history of vaginal irritation.  She has been using Vagifem but not good relief of her symptoms.  Review of Systems     Objective:   Physical Exam Alert and in no distress.  Vaginal exam shows slight irritation superior to the introitus.  KOH was positive.       Assessment & Plan:  Yeast vaginitis - Plan: fluconazole (DIFLUCAN) 150 MG tablet Explained to use it 1 time however they give her a spare just in case.  She was comfortable with that.

## 2019-03-07 ENCOUNTER — Other Ambulatory Visit: Payer: Self-pay

## 2019-03-08 MED ORDER — ACCU-CHEK AVIVA PLUS W/DEVICE KIT: PACK | 0 refills | Status: DC

## 2019-03-12 ENCOUNTER — Telehealth: Payer: Self-pay

## 2019-03-12 MED ORDER — ACCU-CHEK SOFT TOUCH LANCETS MISC: 12 refills | Status: DC

## 2019-03-12 MED ORDER — GLUCOSE BLOOD VI STRP: ORAL_STRIP | 12 refills | Status: DC

## 2019-03-12 NOTE — Telephone Encounter (Signed)
Patient left message stating she needs accucheck lancets and strips.

## 2019-03-17 ENCOUNTER — Other Ambulatory Visit: Payer: Self-pay

## 2019-03-17 NOTE — Patient Outreach (Signed)
[facility]) Care Management  03/17/2019  NILI WARMKESSEL 1956/01/18 [id number]   Medication Adherence call to Mrs. Myrla Halsted [facility] Verify spoke with patient she is past due on Jardiance 10 mg,patient explain she takes 1 tablet daily some times she receives samples from her doctors office because she can not afford to pay the $45 dollars copay,patient explain she does not qualify for Patients Assistance.patient will pick up in  a couple of days. Mrs. Engesser is showing past due under [place].  [facility] Direct Dial [phone]  Fax [phone] [email or web address]

## 2019-03-19 ENCOUNTER — Other Ambulatory Visit: Payer: Self-pay | Admitting: Medical

## 2019-04-16 ENCOUNTER — Telehealth: Payer: Self-pay

## 2019-04-16 ENCOUNTER — Other Ambulatory Visit: Payer: Self-pay

## 2019-04-16 MED ORDER — GLUCOSE BLOOD VI STRP: ORAL_STRIP | 12 refills | Status: DC

## 2019-04-16 NOTE — Telephone Encounter (Signed)
Spoke to patient and the correct strips has been sent. Accu chek Guide

## 2019-04-16 NOTE — Telephone Encounter (Signed)
Pt. Called LM stating that her test strips that were ordered for accu check aviva plus meter do not fit so she needs the right strips ordered for her machine. Pt. Last apt. 11/16/18.

## 2019-05-03 ENCOUNTER — Other Ambulatory Visit: Payer: Self-pay | Admitting: Family Medicine

## 2019-05-03 DIAGNOSIS — B373 Candidiasis of vulva and vagina: Secondary | ICD-10-CM

## 2019-05-03 DIAGNOSIS — B3731 Acute candidiasis of vulva and vagina: Secondary | ICD-10-CM

## 2019-05-04 NOTE — Telephone Encounter (Signed)
Pt scheduled for tomorrow

## 2019-05-04 NOTE — Telephone Encounter (Signed)
Offer visit if acute symtpoms

## 2019-05-05 ENCOUNTER — Other Ambulatory Visit: Payer: Self-pay

## 2019-05-05 ENCOUNTER — Ambulatory Visit (INDEPENDENT_AMBULATORY_CARE_PROVIDER_SITE_OTHER): Payer: Medicare Other | Admitting: Medical

## 2019-05-05 ENCOUNTER — Encounter: Payer: Self-pay | Admitting: Medical

## 2019-05-05 VITALS — BP 148/78 | HR 87 | Temp 97.6°F | Ht 63.0 in | Wt 221.2 lb

## 2019-05-05 DIAGNOSIS — B379 Candidiasis, unspecified: Secondary | ICD-10-CM | POA: Diagnosis not present

## 2019-05-05 DIAGNOSIS — Z79899 Other long term (current) drug therapy: Secondary | ICD-10-CM | POA: Diagnosis not present

## 2019-05-05 DIAGNOSIS — E118 Type 2 diabetes mellitus with unspecified complications: Secondary | ICD-10-CM

## 2019-05-05 DIAGNOSIS — N898 Other specified noninflammatory disorders of vagina: Secondary | ICD-10-CM | POA: Diagnosis not present

## 2019-05-05 DIAGNOSIS — Z780 Asymptomatic menopausal state: Secondary | ICD-10-CM | POA: Insufficient documentation

## 2019-05-05 LAB — POCT WET PREP (WET MOUNT)

## 2019-05-05 LAB — POCT URINALYSIS DIP (PROADVANTAGE DEVICE)
Bilirubin, UA: NEGATIVE
Blood, UA: NEGATIVE
Glucose, UA: 250 mg/dL — AB
Ketones, POC UA: NEGATIVE mg/dL
Leukocytes, UA: NEGATIVE
Nitrite, UA: NEGATIVE
Protein Ur, POC: NEGATIVE mg/dL
Specific Gravity, Urine: 1.02
Urobilinogen, Ur: NEGATIVE
pH, UA: 6 (ref 5.0–8.0)

## 2019-05-05 MED ORDER — FLUCONAZOLE 150 MG PO TABS: 150.0000 mg | ORAL_TABLET | ORAL | 0 refills | Status: DC

## 2019-05-05 NOTE — Progress Notes (Signed)
Subjective: Chief Complaint  Patient presents with  . Vaginal Itching    no discharge or burning    Here for possible yeast infection.  Prior to her last visit in November here she had not had a yeast infection in years.  No recent urinary tract infection either.  She has been having itching and burning on the vulvar area.  No discharge, no odor, no burning with urination.  Has some urinary frequency.  No blood in urine.  No concern for STD.  She is taking an SGL 2 medication.  She feels like she drinks a good amount of water.  Blood sugars have been running 120-140 fasting.  Compliant with medication  Past Medical History:  Diagnosis Date  . Asthma 07/20/2018   one puff per day  . Chronic combined systolic and diastolic heart failure ([place]) 03/03/2018   Echo 07/2018: EF 40-45, diff HK worse in [place], normal RVSF  . Diabetes mellitus without complication ([place]) 5726  . Diabetic eye exam [facility])    [employer]  . Dyspnea   . Elbow fracture, right 2007  . Former smoker    20 pack year history, quit 2010  . Hyperlipidemia   . Hypertension   . Insomnia   . Lung nodule    Chest CT 07/2018:  RLL nodule resolved.  3 mm subpleural LUL nodule.  Repeat in 1 year if high risk.   . Obesity   . PONV (postoperative nausea and vomiting) 2014   1 time  . Wears glasses    reading   Current Outpatient Medications on File Prior to Visit  Medication Sig Dispense Refill  . aspirin EC 81 MG tablet Take 1 tablet (81 mg total) by mouth daily. 90 tablet 3  . atorvastatin (LIPITOR) 40 MG tablet Take 1 tablet (40 mg total) by mouth daily. 90 tablet 2  . Blood Glucose Monitoring Suppl (ACCU-CHEK AVIVA PLUS) w/Device KIT Test blood sugar 2 times daily 1 kit 0  . carvedilol (COREG) 3.125 MG tablet Take 1 tablet (3.125 mg total) by mouth 2 (two) times daily. 180 tablet 3  . celecoxib (CELEBREX) 200 MG capsule TK 1 C PO BID    . cholecalciferol (VITAMIN D3) 25 MCG (1000 UT) tablet Take 2 tablets (2,000 Units  total) by mouth daily. 180 tablet 3  . fluticasone furoate-vilanterol (BREO ELLIPTA) 100-25 MCG/INH AEPB Inhale 1 puff into the lungs daily. 1 each 1  . glucose blood test strip Test 1-2 times daily. Needs strips for ACCU CHEK Guide Device 100 each 12  . JARDIANCE 10 MG TABS tablet TAKE 1 TABLET BY MOUTH DAILY 90 tablet 0  . Lancets (ACCU-CHEK SOFT TOUCH) lancets Use as instructed 100 each 12  . losartan (COZAAR) 50 MG tablet Take 1 tablet (50 mg total) by mouth daily. 90 tablet 3  . metFORMIN (GLUCOPHAGE) 1000 MG tablet Take 1 tablet (1,000 mg total) by mouth 2 (two) times daily with a meal. (Patient taking differently: Take 1,000 mg by mouth daily with breakfast. ) 180 tablet 3  . Multiple Vitamin (MULTIVITAMIN) tablet Take 1 tablet by mouth daily.     Current Facility-Administered Medications on File Prior to Visit  Medication Dose Route Frequency Provider Last Rate Last Admin  . 0.9 %  sodium chloride infusion  500 mL Intravenous Continuous Danis, Henry L III, MD       ROS as in subjective  Objective BP (!) 148/78   Pulse 87   Temp 97.6 F (36.4  C)   Ht _0  (1.6 m)   Wt 221 lb 3.2 oz (100.3 kg)   SpO2 98%   BMI 39.18 kg/m   Wt Readings from Last 3 Encounters:  05/05/19 221 lb 3.2 oz (100.3 kg)  12/23/18 223 lb 3.2 oz (101.2 kg)  11/16/18 223 lb 6.4 oz (101.3 kg)   Gen: wd, wn, nad Gyn: Normal external genitalia without lesions, vulva and in between labia with some pink coloration and irritation, slight whitish discharge, atrophic changes noted, otherwise vagina with normal mucosa, status post hysterectomy, adnexa not enlarged, nontender, no masses.   Exam chaperoned by nurse. Rectal: anus normal appearing   Assessment: Encounter Diagnoses  Name Primary?  . Vaginal itching Yes  . High risk medication use   . Yeast infection   . Diabetes mellitus with complication (HCC)     Plan: Begin Diflucan for yeast vaginitis.  Hydrate well with water throughout the day Return  soon for diabetes follow-up fasting. We discussed medication Jardiance which could potentially increase her risk of yeast infection.  We also discussed that being postmenopausal with vaginal dryness can also increase risk of yeast infection or urinary tract infection particular in the setting of diabetes or sugary diet. If still having problems at recheck, consider vaginal hormonal cream for atrophic changes  Fryda was seen today for vaginal itching.  Diagnoses and all orders for this visit:  Vaginal itching -     POCT Urinalysis DIP ([employer] Device) -     POCT Wet Prep ([place])  High risk medication use  Yeast infection  Diabetes mellitus with complication (HCC)  Other orders -     fluconazole (DIFLUCAN) 150 MG tablet; Take 1 tablet (150 mg total) by mouth once a week. 1 tablet now, may repeat in 1 week

## 2019-05-27 ENCOUNTER — Telehealth: Payer: Self-pay | Admitting: Family Medicine

## 2019-05-27 NOTE — Telephone Encounter (Signed)
If desired she can stop this medicine temporarily and follow-up as planned for the diabetes follow-up

## 2019-05-27 NOTE — Telephone Encounter (Signed)
Pt called she has appt with [place] on 4/21.  She asked for sample of Jardiance 10 mg that she is taking to get her through to the visit before picking up from pharmacy.  She is getting yeast infections and she thinks it may be coming from this medicine.  She will pick up samples.

## 2019-05-31 NOTE — Telephone Encounter (Signed)
Pt picked up samples

## 2019-06-02 ENCOUNTER — Other Ambulatory Visit: Payer: Self-pay

## 2019-06-02 ENCOUNTER — Ambulatory Visit (INDEPENDENT_AMBULATORY_CARE_PROVIDER_SITE_OTHER): Payer: Medicare Other | Admitting: Medical

## 2019-06-02 ENCOUNTER — Encounter: Payer: Self-pay | Admitting: Medical

## 2019-06-02 VITALS — BP 128/80 | HR 79 | Temp 97.7°F | Ht 63.0 in | Wt 218.8 lb

## 2019-06-02 DIAGNOSIS — E785 Hyperlipidemia, unspecified: Secondary | ICD-10-CM

## 2019-06-02 DIAGNOSIS — N898 Other specified noninflammatory disorders of vagina: Secondary | ICD-10-CM

## 2019-06-02 DIAGNOSIS — I10 Essential (primary) hypertension: Secondary | ICD-10-CM

## 2019-06-02 DIAGNOSIS — E118 Type 2 diabetes mellitus with unspecified complications: Secondary | ICD-10-CM

## 2019-06-02 DIAGNOSIS — E559 Vitamin D deficiency, unspecified: Secondary | ICD-10-CM

## 2019-06-02 DIAGNOSIS — R911 Solitary pulmonary nodule: Secondary | ICD-10-CM

## 2019-06-02 DIAGNOSIS — Z79899 Other long term (current) drug therapy: Secondary | ICD-10-CM

## 2019-06-02 MED ORDER — FLUCONAZOLE 150 MG PO TABS: 150.0000 mg | ORAL_TABLET | ORAL | 0 refills | Status: DC

## 2019-06-02 NOTE — Progress Notes (Signed)
Subjective:  Darlene Wade is a 64 y.o. female who presents for Chief Complaint  Patient presents with  . Diabetes    yeast infection better but not completely gone      Here for med check.  Diabetes - taking Metformin 1000mg  daily, not BID.  Has diarrhea if taking it BID.  Taking Jardiance 10mg  daily but still getting vaginal itching and yeast.   Has used a few rounds of diflucan and currently using monistat, but symptoms persist.   attributes to [place].  No foot concerns.  Glucose numbers ok at home.  HTN - taking Losartan 50mg  daily, Coreg 3.125mg  BID,    Hyperlipidemia - compliant with statin  Here for f/u on lung nodule on prior scan.  No cough, no hemoptysis.    otherwise doing ok.   Is on amoxicillin today for prophylaxis due to dental procedure today after this appt  No other aggravating or relieving factors.    No other c/o.  The following portions of the patient's history were reviewed and updated as appropriate: allergies, current medications, past family history, past medical history, past social history, past surgical history and problem list.  ROS Otherwise as in subjective above  Objective: BP 128/80   Pulse 79   Temp 97.7 F (36.5 C)   Ht 5\' 3"  (1.6 m)   Wt 218 lb 12.8 oz (99.2 kg)   SpO2 98%   BMI 38.76 kg/m   General appearance: alert, no distress, well developed, well nourished Neck: supple, no lymphadenopathy, no thyromegaly, no masses Heart: RRR, normal S1, S2, no murmurs Lungs: CTA bilaterally, no wheezes, rhonchi, or rales Abdomen: +bs, soft, non tender, non distended, no masses, no hepatomegaly, no splenomegaly Pulses: 2+ radial pulses, 2+ pedal pulses, normal cap refill Ext: no edema   Assessment: Encounter Diagnoses  Name Primary?  . Essential hypertension, benign Yes  . Diabetes mellitus with complication ([place])   . Vaginal itching   . Hyperlipidemia, unspecified hyperlipidemia type   . Morbid obesity ([place])   . Vitamin D  deficiency   . High risk medication use   . Pulmonary nodule   . Solitary pulmonary nodule      Plan: HTN - compliant with medicaiton, lab today  Diabetes - discontinue Jardiance for now given recurrent yeast vaginitis symptoms.   Complete 1 more diflucan as we stop Jardiance.   She can change to 500mg  Metformin BID for better compliance given loose stools with higher doses. C/t glucose monitoring.   Counseled on diet, exercise.  hyperlipidemia - c/t statin, labs today  Obesity - counseled on diet, exercises, need for weight loss  C/t vit D supplement  Hx/o pulmonary nodule - plan for repeat CT chest in 07/2019  Vaginal itching - repeat diflucan x 1, stop Jardiance.    Darlene Wade was seen today for diabetes.  Diagnoses and all orders for this visit:  Essential hypertension, benign -     Comprehensive metabolic panel -     Microalbumin/Creatinine Ratio, Urine  Diabetes mellitus with complication (HCC) -     Comprehensive metabolic panel -     Microalbumin/Creatinine Ratio, Urine -     Hemoglobin A1c -     Lipid panel  Vaginal itching  Hyperlipidemia, unspecified hyperlipidemia type -     Comprehensive metabolic panel -     Lipid panel  Morbid obesity (HCC)  Vitamin D deficiency  High risk medication use  Pulmonary nodule  Solitary pulmonary nodule -     CT  CHEST NODULE FOLLOW UP LOW DOSE W/O; Future  Other orders -     fluconazole (DIFLUCAN) 150 MG tablet; Take 1 tablet (150 mg total) by mouth once a week. 1 tablet now, may repeat in 1 week    Follow up: pending labs

## 2019-06-03 ENCOUNTER — Other Ambulatory Visit: Payer: Self-pay | Admitting: Medical

## 2019-06-03 DIAGNOSIS — J4520 Mild intermittent asthma, uncomplicated: Secondary | ICD-10-CM

## 2019-06-03 DIAGNOSIS — E119 Type 2 diabetes mellitus without complications: Secondary | ICD-10-CM

## 2019-06-03 LAB — COMPREHENSIVE METABOLIC PANEL
ALT: 11 IU/L (ref 0–32)
AST: 15 IU/L (ref 0–40)
Albumin/Globulin Ratio: 1.5 (ref 1.2–2.2)
Albumin: 4.4 g/dL (ref 3.8–4.8)
Alkaline Phosphatase: 109 IU/L (ref 39–117)
BUN/Creatinine Ratio: 20 (ref 12–28)
BUN: 15 mg/dL (ref 8–27)
Bilirubin Total: 0.4 mg/dL (ref 0.0–1.2)
CO2: 25 mmol/L (ref 20–29)
Calcium: 9.9 mg/dL (ref 8.7–10.3)
Chloride: 104 mmol/L (ref 96–106)
Creatinine, Ser: 0.76 mg/dL (ref 0.57–1.00)
GFR calc Af Amer: 95 mL/min/{1.73_m2} (ref >59)
GFR calc non Af Amer: 83 mL/min/{1.73_m2} (ref >59)
Globulin, Total: 3 g/dL (ref 1.5–4.5)
Glucose: 92 mg/dL (ref 65–99)
Potassium: 4.3 mmol/L (ref 3.5–5.2)
Sodium: 143 mmol/L (ref 134–144)
Total Protein: 7.4 g/dL (ref 6.0–8.5)

## 2019-06-03 LAB — LIPID PANEL
Chol/HDL Ratio: 2 ratio (ref 0.0–4.4)
Cholesterol, Total: 126 mg/dL (ref 100–199)
HDL: 64 mg/dL (ref >39)
LDL Chol Calc (NIH): 48 mg/dL (ref 0–99)
Triglycerides: 71 mg/dL (ref 0–149)
VLDL Cholesterol Cal: 14 mg/dL (ref 5–40)

## 2019-06-03 LAB — MICROALBUMIN / CREATININE URINE RATIO
Creatinine, Urine: 111 mg/dL
Microalb/Creat Ratio: 6 mg/g creat (ref 0–29)
Microalbumin, Urine: 7.2 ug/mL

## 2019-06-03 LAB — HEMOGLOBIN A1C
Est. average glucose Bld gHb Est-mCnc: 146 mg/dL
Hgb A1c MFr Bld: 6.7 % — ABNORMAL HIGH (ref 4.8–5.6)

## 2019-06-03 MED ORDER — BREO ELLIPTA 100-25 MCG/INH IN AEPB: 1.0000 | INHALATION_SPRAY | Freq: Every day | RESPIRATORY_TRACT | 1 refills | Status: DC

## 2019-06-03 MED ORDER — JANUMET 50-500 MG PO TABS: 1.0000 | ORAL_TABLET | Freq: Two times a day (BID) | ORAL | 1 refills | Status: DC

## 2019-06-03 MED ORDER — ASPIRIN EC 81 MG PO TBEC: 81.0000 mg | DELAYED_RELEASE_TABLET | Freq: Every day | ORAL | 3 refills | Status: DC

## 2019-06-03 MED ORDER — CARVEDILOL 3.125 MG PO TABS: 3.1250 mg | ORAL_TABLET | Freq: Two times a day (BID) | ORAL | 0 refills | Status: DC

## 2019-06-03 MED ORDER — ATORVASTATIN CALCIUM 40 MG PO TABS: 40.0000 mg | ORAL_TABLET | Freq: Every day | ORAL | 3 refills | Status: DC

## 2019-06-03 MED ORDER — VITAMIN D 25 MCG (1000 UNIT) PO TABS: 2000.0000 [IU] | ORAL_TABLET | Freq: Every day | ORAL | 3 refills | Status: DC

## 2019-06-29 ENCOUNTER — Ambulatory Visit
Admission: RE | Admit: 2019-06-29 | Discharge: 2019-06-29 | Disposition: A | Discharge status: Home / Self Care | Payer: Medicare Other | Source: Ambulatory Visit | Attending: Medical | Admitting: Medical

## 2019-06-29 ENCOUNTER — Other Ambulatory Visit: Payer: Self-pay

## 2019-06-29 DIAGNOSIS — R911 Solitary pulmonary nodule: Secondary | ICD-10-CM | POA: Diagnosis not present

## 2019-06-29 DIAGNOSIS — R918 Other nonspecific abnormal finding of lung field: Secondary | ICD-10-CM | POA: Diagnosis not present

## 2019-07-19 ENCOUNTER — Encounter: Payer: Self-pay | Admitting: Medical

## 2019-07-19 ENCOUNTER — Other Ambulatory Visit: Payer: Self-pay

## 2019-07-19 ENCOUNTER — Ambulatory Visit (INDEPENDENT_AMBULATORY_CARE_PROVIDER_SITE_OTHER): Payer: Medicare Other | Admitting: Medical

## 2019-07-19 VITALS — BP 164/82 | HR 84 | Ht 63.0 in | Wt 216.2 lb

## 2019-07-19 DIAGNOSIS — W19XXXA Unspecified fall, initial encounter: Secondary | ICD-10-CM

## 2019-07-19 DIAGNOSIS — M25562 Pain in left knee: Secondary | ICD-10-CM

## 2019-07-19 DIAGNOSIS — M25512 Pain in left shoulder: Secondary | ICD-10-CM | POA: Diagnosis not present

## 2019-07-19 MED ORDER — HYDROCODONE-ACETAMINOPHEN 5-325 MG PO TABS: 1.0000 | ORAL_TABLET | Freq: Four times a day (QID) | ORAL | 0 refills | Status: DC | PRN

## 2019-07-19 NOTE — Progress Notes (Signed)
Subjective: Chief Complaint  Patient presents with  . Fall  . Shoulder Pain    left  . Knee Pain    left    Here for fall.   [place] a few weeks ago.  Was stepping down from a height, left foot turned wrong.  Ended up landing on her left side.   Denies head injury or LOC.    Took a few minutes to get up from fall.   During the fall didn't notice pain, but when got up had pain in left knee, left shoulder.  Has had ongoing pains in left shoulder and knee since the fall.  She has hx/o knee surgery of left knee, 2020.     She feels some swelling in left knee.   Walking, lying down, trying to reach for something aggravates the pains.  Hurts to raise left shoulder.  putting on shirts hurt.    Using some tylenol 2 times daily for pain.  No other aggravating or relieving factors. No other complaint.   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  . Acute pain of left shoulder Yes  . Acute pain of left knee   . Fall, initial encounter       Plan: I suspect mild to moderate left shoulder  sprain but no step-off deformity, advised relative rest, stretching range of motion exercises, arm sling for 2 to 3 hours at a time for the next week, can use ice 20 minutes on 20 minutes off for the next few days, Tylenol for pain.  He can use hydrocodone for worse pain at night limited time  Left knee with likely inflammation bruising-advised relative rest, ice, elevation of leg, hydrocodone for breakthrough pain, and some that should gradually improve over the next 10 to 14 days.  She will add back low resistance stationary bike for range of motion exercise  If not improving over the next 2 weeks then recheck   Girl was seen today for fall, shoulder pain and knee pain.  Diagnoses and all orders for this visit:  Acute pain of left shoulder  Acute pain of left knee  Fall, initial encounter  Other orders -     HYDROcodone-acetaminophen (NORCO) 5-325 MG tablet; Take 1 tablet by mouth every 6 (six) hours as needed.   2 weeks

## 2019-08-04 ENCOUNTER — Other Ambulatory Visit: Payer: Self-pay | Admitting: Medical

## 2019-08-28 ENCOUNTER — Other Ambulatory Visit: Payer: Self-pay | Admitting: Medical

## 2019-09-02 ENCOUNTER — Other Ambulatory Visit: Payer: Self-pay | Admitting: Medical

## 2019-09-02 DIAGNOSIS — Z1231 Encounter for screening mammogram for malignant neoplasm of breast: Secondary | ICD-10-CM

## 2019-09-02 DIAGNOSIS — E119 Type 2 diabetes mellitus without complications: Secondary | ICD-10-CM | POA: Diagnosis not present

## 2019-09-02 DIAGNOSIS — H52223 Regular astigmatism, bilateral: Secondary | ICD-10-CM | POA: Diagnosis not present

## 2019-09-13 ENCOUNTER — Other Ambulatory Visit: Payer: Self-pay | Admitting: Medical

## 2019-11-02 ENCOUNTER — Other Ambulatory Visit: Payer: Self-pay

## 2019-11-02 ENCOUNTER — Ambulatory Visit: Payer: Medicare Other

## 2019-11-02 ENCOUNTER — Ambulatory Visit
Admission: RE | Admit: 2019-11-02 | Discharge: 2019-11-02 | Disposition: A | Discharge status: Home / Self Care | Payer: Medicare Other | Source: Ambulatory Visit | Attending: Medical | Admitting: Medical

## 2019-11-02 DIAGNOSIS — Z1231 Encounter for screening mammogram for malignant neoplasm of breast: Secondary | ICD-10-CM | POA: Diagnosis not present

## 2019-11-17 ENCOUNTER — Encounter: Payer: Self-pay | Admitting: Medical

## 2019-11-17 ENCOUNTER — Ambulatory Visit (INDEPENDENT_AMBULATORY_CARE_PROVIDER_SITE_OTHER): Payer: Medicare Other | Admitting: Medical

## 2019-11-17 ENCOUNTER — Telehealth: Payer: Self-pay | Admitting: Medical

## 2019-11-17 ENCOUNTER — Other Ambulatory Visit: Payer: Self-pay

## 2019-11-17 VITALS — BP 142/82 | HR 81 | Ht 63.0 in | Wt 221.2 lb

## 2019-11-17 DIAGNOSIS — J4520 Mild intermittent asthma, uncomplicated: Secondary | ICD-10-CM | POA: Diagnosis not present

## 2019-11-17 DIAGNOSIS — Z7185 Encounter for immunization safety counseling: Secondary | ICD-10-CM | POA: Diagnosis not present

## 2019-11-17 DIAGNOSIS — I10 Essential (primary) hypertension: Secondary | ICD-10-CM | POA: Diagnosis not present

## 2019-11-17 DIAGNOSIS — E785 Hyperlipidemia, unspecified: Secondary | ICD-10-CM

## 2019-11-17 DIAGNOSIS — Z8781 Personal history of (healed) traumatic fracture: Secondary | ICD-10-CM

## 2019-11-17 DIAGNOSIS — R911 Solitary pulmonary nodule: Secondary | ICD-10-CM

## 2019-11-17 DIAGNOSIS — Z23 Encounter for immunization: Secondary | ICD-10-CM

## 2019-11-17 DIAGNOSIS — Z79899 Other long term (current) drug therapy: Secondary | ICD-10-CM

## 2019-11-17 DIAGNOSIS — Z780 Asymptomatic menopausal state: Secondary | ICD-10-CM

## 2019-11-17 DIAGNOSIS — I5042 Chronic combined systolic (congestive) and diastolic (congestive) heart failure: Secondary | ICD-10-CM | POA: Diagnosis not present

## 2019-11-17 DIAGNOSIS — E118 Type 2 diabetes mellitus with unspecified complications: Secondary | ICD-10-CM

## 2019-11-17 DIAGNOSIS — E559 Vitamin D deficiency, unspecified: Secondary | ICD-10-CM

## 2019-11-17 DIAGNOSIS — Z96652 Presence of left artificial knee joint: Secondary | ICD-10-CM

## 2019-11-17 DIAGNOSIS — I700 Atherosclerosis of aorta: Secondary | ICD-10-CM

## 2019-11-17 DIAGNOSIS — M1712 Unilateral primary osteoarthritis, left knee: Secondary | ICD-10-CM

## 2019-11-17 DIAGNOSIS — I2510 Atherosclerotic heart disease of native coronary artery without angina pectoris: Secondary | ICD-10-CM | POA: Insufficient documentation

## 2019-11-17 DIAGNOSIS — E2839 Other primary ovarian failure: Secondary | ICD-10-CM

## 2019-11-17 DIAGNOSIS — N952 Postmenopausal atrophic vaginitis: Secondary | ICD-10-CM

## 2019-11-17 DIAGNOSIS — D539 Nutritional anemia, unspecified: Secondary | ICD-10-CM

## 2019-11-17 DIAGNOSIS — Z0000 Encounter for general adult medical examination without abnormal findings: Secondary | ICD-10-CM | POA: Diagnosis not present

## 2019-11-17 DIAGNOSIS — Z7189 Other specified counseling: Secondary | ICD-10-CM

## 2019-11-17 DIAGNOSIS — M19079 Primary osteoarthritis, unspecified ankle and foot: Secondary | ICD-10-CM | POA: Diagnosis not present

## 2019-11-17 LAB — COMPREHENSIVE METABOLIC PANEL
ALT: 13 IU/L (ref 0–32)
AST: 17 IU/L (ref 0–40)
Albumin/Globulin Ratio: 1.3 (ref 1.2–2.2)
Albumin: 4.3 g/dL (ref 3.8–4.8)
Alkaline Phosphatase: 92 IU/L (ref 44–121)
BUN/Creatinine Ratio: 21 (ref 12–28)
BUN: 17 mg/dL (ref 8–27)
Bilirubin Total: 0.2 mg/dL (ref 0.0–1.2)
CO2: 23 mmol/L (ref 20–29)
Calcium: 9.7 mg/dL (ref 8.7–10.3)
Chloride: 104 mmol/L (ref 96–106)
Creatinine, Ser: 0.81 mg/dL (ref 0.57–1.00)
GFR calc Af Amer: 88 mL/min/{1.73_m2} (ref >59)
GFR calc non Af Amer: 76 mL/min/{1.73_m2} (ref >59)
Globulin, Total: 3.2 g/dL (ref 1.5–4.5)
Glucose: 117 mg/dL — ABNORMAL HIGH (ref 65–99)
Potassium: 4.6 mmol/L (ref 3.5–5.2)
Sodium: 141 mmol/L (ref 134–144)
Total Protein: 7.5 g/dL (ref 6.0–8.5)

## 2019-11-17 LAB — CBC WITH DIFFERENTIAL/PLATELET
Basophils Absolute: 0.1 10*3/uL (ref 0.0–0.2)
Basos: 1 %
EOS (ABSOLUTE): 0.4 10*3/uL (ref 0.0–0.4)
Eos: 5 %
Hematocrit: 36.3 % (ref 34.0–46.6)
Hemoglobin: 11.7 g/dL (ref 11.1–15.9)
Immature Grans (Abs): 0 10*3/uL (ref 0.0–0.1)
Immature Granulocytes: 0 %
Lymphocytes Absolute: 2.4 10*3/uL (ref 0.7–3.1)
Lymphs: 31 %
MCH: 28.5 pg (ref 26.6–33.0)
MCHC: 32.2 g/dL (ref 31.5–35.7)
MCV: 89 fL (ref 79–97)
Monocytes Absolute: 0.6 10*3/uL (ref 0.1–0.9)
Monocytes: 7 %
Neutrophils Absolute: 4.4 10*3/uL (ref 1.4–7.0)
Neutrophils: 56 %
Platelets: 245 10*3/uL (ref 150–450)
RBC: 4.1 x10E6/uL (ref 3.77–5.28)
RDW: 13.7 % (ref 11.7–15.4)
WBC: 7.9 10*3/uL (ref 3.4–10.8)

## 2019-11-17 LAB — HEMOGLOBIN A1C
Est. average glucose Bld gHb Est-mCnc: 140 mg/dL
Hgb A1c MFr Bld: 6.5 % — ABNORMAL HIGH (ref 4.8–5.6)

## 2019-11-17 NOTE — Telephone Encounter (Signed)
Freestyle libre im assuming she said it was the one that you where on your arm

## 2019-11-17 NOTE — Telephone Encounter (Signed)
Script ready to fax

## 2019-11-17 NOTE — Telephone Encounter (Signed)
Do you mean the freestyle libre or regular glucometer?

## 2019-11-17 NOTE — Progress Notes (Signed)
Subjective:    Darlene Wade is a 64 y.o. female who presents for Preventative Services visit and chronic medical problems/med check visit.    Primary Care Provider Tysinger, Camelia Eng, PA-C here for primary care  Current Health Care Team:  Dentist, [place] doctor, [facility]    Dr. Frederik Pear, ortho  Dr. Mertie Moores and Richardson Dopp, PA-C, cardiology  Dr. Wilfrid Lund, GI  Dr. Donnie Mesa, general surgery   Medical Services you may have received from other than [facility] providers in the past year (date may be approximate) [facility]-Dr. Mayer Camel   Exercise Current exercise habits: The patient does not participate in regular exercise at present. She reports walking sometimes.  Nutrition/Diet Current diet: well balanced  Patient reports that does not check blood pressure regularly and only check when feeling abnormal.  Depression Screen Depression screen PHQ 2/9 11/17/2019  Decreased Interest 0  Down, Depressed, Hopeless 0  PHQ - 2 Score 0  Altered sleeping -  Tired, decreased energy -  Change in appetite -  Feeling bad or failure about yourself  -  Trouble concentrating -  Moving slowly or fidgety/restless -  Suicidal thoughts -  PHQ-9 Score -    Activities of Daily Living Screen/Functional Status Survey Is the patient deaf or have difficulty hearing?: No Does the patient have difficulty seeing, even when wearing glasses/contacts?: No Does the patient have difficulty concentrating, remembering, or making decisions?: No Does the patient have difficulty walking or climbing stairs?: No Does the patient have difficulty dressing or bathing?: No Does the patient have difficulty doing errands alone such as visiting a doctor's office or shopping?: No  Can patient draw a clock face showing 3:15 oclock,yes  Fall Risk Screen Fall Risk  11/17/2019 11/16/2018 11/13/2017 05/22/2016  Falls in the past year? 1 0 No No  Number falls in past yr: 1 - - -  Injury  with Fall? 0 - - -    Gait Assessment: Normal gait observed -yes  Advanced directives Does patient have a [place]? No Does patient have a Living Will? No    Past Medical History:  Diagnosis Date  . Asthma 07/20/2018   one puff per day  . Chronic combined systolic and diastolic heart failure ([place]) 03/03/2018   Echo 07/2018: EF 40-45, diff HK worse in Inf base, normal RVSF  . Diabetes mellitus without complication ([place]) 1025  . Diabetic eye exam [facility])    [employer]  . Dyspnea   . Elbow fracture, right 2007  . Former smoker    20 pack year history, quit 2010  . Hyperlipidemia   . Hypertension   . Insomnia   . Lung nodule    Chest CT 07/2018:  RLL nodule resolved.  3 mm subpleural LUL nodule.  Repeat in 1 year if high risk.   . Obesity   . PONV (postoperative nausea and vomiting) 2014   1 time  . Wears glasses    reading    Past Surgical History:  Procedure Laterality Date  . BREAST CYST ASPIRATION  2015  . BREAST EXCISIONAL BIOPSY Left   . BREAST LUMPECTOMY WITH NEEDLE LOCALIZATION Left 10/07/2012   Procedure: BREAST LUMPECTOMY WITH NEEDLE LOCALIZATION;  Surgeon: Imogene Burn. Georgette Dover, MD;  Location: [facility];  Service: General;  Laterality: Left;  . BREAST SURGERY    . COLONOSCOPY  01/2016   01/2016 with Dr. Loletha Carrow, tubular adenoma polpy; 2003 with Dr.  Ouzinkie  2006   rt-fx  . FOOT ARTHROTOMY  11/12   foot fusion, right  . FOOT MASS EXCISION  3/14   left-fusion  . PARTIAL HYSTERECTOMY  age 9   uterine fibroids, still has ovaries  . TONSILLECTOMY    . TOTAL KNEE ARTHROPLASTY Left 08/10/2018   Procedure: Left Knee Arthroplasty;  Surgeon: Frederik Pear, MD;  Location: [facility];  Service: Orthopedics;  Laterality: Left;      Family History  Problem Relation Age of Onset  . Diabetes Mother   . Hypertension Mother   . Diabetes Maternal Aunt   . Heart disease Maternal Grandfather   . Cancer Neg Hx   . Breast  cancer Neg Hx      Current Outpatient Medications:  .  aspirin EC 81 MG tablet, Take 1 tablet (81 mg total) by mouth daily., Disp: 90 tablet, Rfl: 3 .  atorvastatin (LIPITOR) 40 MG tablet, Take 1 tablet (40 mg total) by mouth daily., Disp: 90 tablet, Rfl: 3 .  Blood Glucose Monitoring Suppl (ACCU-CHEK AVIVA PLUS) w/Device KIT, Test blood sugar 2 times daily, Disp: 1 kit, Rfl: 0 .  carvedilol (COREG) 3.125 MG tablet, TAKE 1 TABLET BY MOUTH  TWICE DAILY, Disp: 180 tablet, Rfl: 3 .  cholecalciferol (VITAMIN D3) 25 MCG (1000 UNIT) tablet, Take 2 tablets (2,000 Units total) by mouth daily., Disp: 180 tablet, Rfl: 3 .  glucose blood test strip, Test 1-2 times daily. Needs strips for ACCU CHEK Guide Device, Disp: 100 each, Rfl: 12 .  HYDROcodone-acetaminophen (NORCO) 5-325 MG tablet, Take 1 tablet by mouth every 6 (six) hours as needed., Disp: 12 tablet, Rfl: 0 .  Lancets (ACCU-CHEK SOFT TOUCH) lancets, Use as instructed, Disp: 100 each, Rfl: 12 .  losartan (COZAAR) 50 MG tablet, TAKE 1 TABLET BY MOUTH  DAILY, Disp: 90 tablet, Rfl: 3 .  Multiple Vitamin (MULTIVITAMIN) tablet, Take 1 tablet by mouth daily., Disp: , Rfl:  .  sitaGLIPtin-metformin (JANUMET) 50-500 MG tablet, Take 1 tablet by mouth 2 (two) times daily with a meal., Disp: 180 tablet, Rfl: 1  Current Facility-Administered Medications:  .  0.9 %  sodium chloride infusion, 500 mL, Intravenous, Continuous, Danis, Kirke Corin, MD  Allergies  Allergen Reactions  . Oxycodone Nausea And Vomiting  . Naproxen Nausea Only  . Vicodin [Hydrocodone-Acetaminophen] Nausea Only    History reviewed: allergies, current medications, past family history, past medical history, past social history, past surgical history and problem list  Chronic issues discussed: Chronic knee pain-her knees have been giving her some problems, sometimes swelling, particularly on the right.  She has had knee surgery on the left.  She has seen orthopedics and had x-rays  within the last 2 years  Diabetes-not checking sugars, compliant with medication though.  No particular complaints.  Hypertension-compliant with medicine, not checking home blood pressure unless she has a headache then she will check it sometimes  No other new complaint  Acute issues discussed: None  Advanced directives: Living will and health care power of attorney.  Objective:      Biometrics BP (!) 142/82   Pulse 81   Ht _0  (1.6 m)   Wt 221 lb 3.2 oz (100.3 kg)   SpO2 96%   BMI 39.18 kg/m   Cognitive Testing  Alert? Yes  Normal Appearance?Yes  Oriented to person? Yes  Place? Yes   Time? Yes  Recall of three objects?  Yes  Can perform simple calculations?  Yes  Displays appropriate judgment?Yes  Can read the correct time from a watch face?Yes  General appearance: alert, no distress, WD/WN, African-American female  Nutritional Status: Inadequate calore intake? no Loss of muscle mass? no Loss of fat beneath skin? no Localized or general edema? no Diminished functional status? no  Other pertinent exam: HENT, oral - not examined given covid precautions Neck: supple, no lymphadenopathy, no thyromegaly, no masses, no bruits Heart: RRR, normal S1, S2, no murmurs Lungs: CTA bilaterally, no wheezes, rhonchi, or rales Abdomen: +bs, soft, non tender, non distended, no masses, no hepatomegaly, no splenomegaly Musculoskeletal: Right knee dorsal foot with linear surgical scar down the middle of the foot, bilateral great toes with surgical scars from prior bunionectomy, both knees with some mild joint tenderness, no obvious swelling, rest of MSK unremarkable  nontender, no swelling, no obvious deformity Extremities: no edema, no cyanosis, no clubbing Pulses: 2+ symmetric, upper and lower extremities, normal cap refill Neurological: alert, oriented x 3, CN2-12 intact, strength normal upper extremities and lower extremities, sensation normal throughout, DTRs 2+ throughout, no  cerebellar signs, gait normal Psychiatric: normal affect, behavior normal, pleasant  Breast, pelvic and rectal declined Skin: Unremarkable  Diabetic Foot Exam - Simple   Simple Foot Form Diabetic Foot exam was performed with the following findings: Yes 11/17/2019  9:57 AM  Visual Inspection See comments: Yes Sensation Testing Intact to touch and monofilament testing bilaterally: Yes Pulse Check Posterior Tibialis and Dorsalis pulse intact bilaterally: Yes Comments Bilateral feet with surgical scars noted in the musculoskeletal exam above.  Otherwise no lesions       Assessment:   Encounter Diagnoses  Name Primary?  . Encounter for health maintenance examination in adult Yes  . Medicare annual wellness visit, subsequent   . Essential hypertension, benign   . Chronic combined systolic and diastolic heart failure ([place])   . Mild intermittent asthma without complication   . Diabetes mellitus with complication ([place])   . Osteoarthritis of foot, unspecified laterality, unspecified osteoarthritis type   . Primary osteoarthritis of left knee   . Atrophic vaginitis   . Hyperlipidemia, unspecified hyperlipidemia type   . Need for influenza vaccination   . Vaccine counseling   . Morbid obesity ([place])   . Nutritional anemia, unspecified   . Vitamin D deficiency   . Estrogen deficiency   . History of fracture   . S/P total knee arthroplasty, left   . Postmenopausal estrogen deficiency   . High risk medication use   . Advanced directives, counseling/discussion   . Pulmonary nodule   . Coronary artery disease involving native coronary artery of native heart without angina pectoris   . Aortic atherosclerosis ([place])      Plan:   A preventative services visit was completed today.  During the course of the visit today, we discussed and counseled about appropriate screening and preventive services.  A health risk assessment was established today that included a review of current  medications, allergies, social history, family history, medical and preventative health history, biometrics, and preventative screenings to identify potential safety concerns or impairments.  A personalized plan was printed today for your records and use.   Personalized health advice and education was given today to reduce health risks and promote self management and wellness.  Information regarding end of life planning was discussed today.    Recommendations:  I recommend a yearly ophthalmology/optometry visit for glaucoma screening and eye checkup  I recommended a yearly dental visit for hygiene and checkup  Advanced directives - discussed nature and purpose of Advanced Directives, encouraged them to complete them if they have not done so and/or encouraged them to get [place] a copy if they have done this already.   Advanced directives Complete your advance directives, and last one testament.  Make sure we get a copy of the advance directives  See your eye doctor and dentist yearly   Referrals today: I recommend getting back in with orthopedics  You are most likely due for colonoscopy repeat 2022.  I will reach out to Dr. Loletha Carrow and ask about this.    Immunizations: Check your insurance to see if you can have the shingles vaccine here.  If so come in soon and do the shingles vaccine 2 shots given over 2 months  We updated your flu shot today  At some point in the next few months we can update a pneumococcal 23 vaccine  You are up-to-date on tetanus and Covid vaccines  Counseled on the influenza virus vaccine.  Vaccine information sheet given.   High dose Influenza vaccine given after consent obtained.   Cancer screens September 2021 mammography reviewed, normal  Colonoscopy Her last colonoscopy showed a tubular adenoma polyp.  She is likely due for follow-up next year.  I will reach out to gastroenterology to confirm next appointment date   Chronic issues: Chronic knee  pain  I recommend you follow-up with orthopedics.  You may need updated x-ray.  Use Tylenol as needed.  Stay away from anti-inflammatory such as ibuprofen, Aleve, Advil, Motrin due to potential harmful effects of the kidney   High blood pressure  Not at goal today  Pending labs I am likely going to increase both of your medications  Follow-up with cardiology soon  I do recommend you check home blood pressures at least a few times per week to make sure your blood pressures are staying less than 130/80  Having uncontrolled high blood pressure can injure the heart, the kidneys, and put you at risk for heart attack and stroke   History of heart failure, coronary artery disease and atherosclerosis or cholesterol buildup in the artery  Continue aspirin, cholesterol medicine and blood pressure medicine regularly  I reviewed back in your cardiology notes and you are due for follow-up at this time with cardiology   Diabetes  I recommend you check your blood sugars most days per week fasting in the morning  Goal is less than 130 fasting glucose  Check your feet daily for sores or wounds  See your eye doctor every year make sure they send [place] a copy of your eye exam   Asthma Doing well, no particular recent complaint   Hyperlipidemia Continue current medication, last lipids April 2021 reviewed and at goal   Obesity Continue to work on efforts to lose weight through healthy diet and exercise   Nutritional deficiency Repeat labs today   Atrophic vaginitis on prior exam Consider vaginal cream periodically   Pulmonary nodule I reviewed the May 2021 CT chest in the chart record.  There was a 2 mm subpleural nodules which are benign and no needed further follow-up.  There was noted aortic atherosclerosis and coronary artery calcification   Estrogen deficiency We will recommend an updated bone density test.  I reviewed her last bone density from August 2018 which was  normal    Hillary was seen today for medicare wellness.  Diagnoses and all orders for this visit:  Encounter for health maintenance examination in adult -  Hemoglobin A1c -     Comprehensive metabolic panel -     CBC with Differential/Platelet  Medicare annual wellness visit, subsequent  Essential hypertension, benign -     Comprehensive metabolic panel  Chronic combined systolic and diastolic heart failure (HCC)  Mild intermittent asthma without complication  Diabetes mellitus with complication (HCC) -     Hemoglobin A1c  Osteoarthritis of foot, unspecified laterality, unspecified osteoarthritis type  Primary osteoarthritis of left knee  Atrophic vaginitis  Hyperlipidemia, unspecified hyperlipidemia type -     Comprehensive metabolic panel  Need for influenza vaccination -     Flu Vaccine QUAD High Dose(Fluad)  Vaccine counseling -     Flu Vaccine QUAD High Dose(Fluad)  Morbid obesity (HCC)  Nutritional anemia, unspecified -     CBC with Differential/Platelet  Vitamin D deficiency  Estrogen deficiency  History of fracture  S/P total knee arthroplasty, left  Postmenopausal estrogen deficiency  High risk medication use  Advanced directives, counseling/discussion  Pulmonary nodule  Coronary artery disease involving native coronary artery of native heart without angina pectoris  Aortic atherosclerosis ([place])     Medicare Attestation A preventative services visit was completed today.  During the course of the visit the patient was educated and counseled about appropriate screening and preventive services.  A health risk assessment was established with the patient that included a review of current medications, allergies, social history, family history, medical and preventative health history, biometrics, and preventative screenings to identify potential safety concerns or impairments.  A personalized plan was printed today for the patient's records  and use.   Personalized health advice and education was given today to reduce health risks and promote self management and wellness.  Information regarding end of life planning was discussed today.  Dorothea Ogle, PA-C   11/17/2019

## 2019-11-17 NOTE — Telephone Encounter (Signed)
Done

## 2019-11-17 NOTE — Patient Instructions (Addendum)
Recommendations:  I recommend a yearly ophthalmology/optometry visit for glaucoma screening and eye checkup  I recommended a yearly dental visit for hygiene and checkup  Advanced directives - discussed nature and purpose of Advanced Directives, encouraged them to complete them if they have not done so and/or encouraged them to get [place] a copy if they have done this already.   Advanced directives Complete your advance directives, and last one testament.  Make sure we get a copy of the advance directives  See your eye doctor and dentist yearly   Referrals today: I recommend getting back in with orthopedics  You are most likely due for colonoscopy repeat 2022.  I will reach out to Dr. Loletha Carrow and ask about this.    Immunizations: Check your insurance to see if you can have the shingles vaccine here.  If so come in soon and do the shingles vaccine 2 shots given over 2 months  We updated your flu shot today  At some point in the next few months we can update a pneumococcal 23 vaccine  You are up-to-date on tetanus and Covid vaccines  Counseled on the influenza virus vaccine.  Vaccine information sheet given.   High dose Influenza vaccine given after consent obtained.     Chronic issues: Chronic knee pain  I recommend you follow-up with orthopedics.  You may need updated x-ray.  Use Tylenol as needed.  Stay away from anti-inflammatory such as ibuprofen, Aleve, Advil, Motrin due to potential harmful effects of the kidney   High blood pressure  Not at goal today  Pending labs I am likely going to increase both of your medications  Follow-up with cardiology soon  I do recommend you check home blood pressures at least a few times per week to make sure your blood pressures are staying less than 130/80  Having uncontrolled high blood pressure can injure the heart, the kidneys, and put you at risk for heart attack and stroke   History of heart failure, coronary artery disease  and atherosclerosis or cholesterol buildup in the artery  Continue aspirin, cholesterol medicine and blood pressure medicine regularly  I reviewed back in your cardiology notes and you are due for follow-up at this time with cardiology   Diabetes  I recommend you check your blood sugars most days per week fasting in the morning  Goal is less than 130 fasting glucose  Check your feet daily for sores or wounds  See your eye doctor every year make sure they send [place] a copy of your eye exam      Fall Prevention in Hospitals, Adult Being a patient in the hospital puts you at risk for falling. Falls can cause serious injury and harm, but they can be prevented. It is important to understand what puts you at risk for falling and what you and your health care team can do to prevent you from falling. If you or a loved one falls at the hospital, it is important to tell hospital staff about it. What increases the risk for falls? Certain conditions and treatments may increase your risk of falling in the hospital. These include:  Being in an unfamiliar environment, especially when using the bathroom at night.  Having surgery.  Being on bed rest.  Taking many medicines or certain types of medicines, such as sleeping pills.  Having tubes in place, such as IV lines or catheters. Other risk factors for falls in a hospital include:  Having difficulty with hearing or vision.  Having a change in thinking or behavior, such as confusion.  Having depression.  Having trouble with balance.  Being a female.  Feeling dizzy.  Needing to use the toilet frequently.  Having fallen during the past three months.  Having low blood pressure. What are some strategies for preventing falls? If you or a loved one has to stay in the hospital:  Ask about which fall prevention strategies will be in place. Do not hesitate to speak up if you notice that the fall prevention plan has changed.  Ask for help  moving around, especially after surgery or when feeling unwell.  If you have been asked to call for help when getting up, do not get up by yourself. Asking for help with getting up is for your safety, and the staff is there to help you.  Wear nonskid footwear.  Get up slowly, and sit at the side of the bed for a few minutes before standing up.  Keep items you need, such as the nurse call button or a phone, close to you so that you do not need to reach for them.  Wear eyeglasses or hearing aids if you have them.  Have someone stay in the hospital with you or your loved one.  Ask if sleeping pills or other medicines that can cause confusion are necessary. What does the hospital staff do to help prevent falls? Hospitals have systems in place to prevent falls and accidents, which may involve:  Discussing your fall risks and making a personalized fall prevention plan.  Checking in regularly to see if you need help.  Placing an arm band on your wrist or a sign near your room to alert other staff of your needs.  Using an alarm on your hospital bed. This is an alarm that goes off if you get out of bed and forget to call for help.  Keeping the bed in a low and locked position.  Keeping the area around the bed and bathroom well-lit and free from clutter.  Keeping your room quiet, so that you can sleep and be well-rested.  Using safety equipment, such as: ? A belt around your waist. ? Walkers, crutches, and other devices for support. ? Safety beds, such as low beds or cushions on the floor next to the bed.  Having a staff person stay with you (one-on-one observation), even when you are using the bathroom. This is for your safety.  Using video monitoring. This allows a staff member to come to help you if you need help. What other actions can I take to lower my risk of falls?  Check in regularly with your health care provider or pharmacist to review all of the medicines that you  take.  Make sure that you have a regular exercise program to stay fit. This will help you maintain your balance.  Talk with a physical therapist or trainer if recommended by your health care provider. They can help you to improve your strength, balance, and endurance.  If you are over age 61: ? Ask your health care provider if you need a calcium or vitamin D supplement. ? Have your eyes and hearing checked every year. ? Have your feet checked every year. Where to find more information You can find more information about fall prevention from the Centers for Disease Control and Prevention: ImproveLook.cz Summary  Being in an unfamiliar environment, such as the hospital, increases your risk for falling.  If you have been asked to call for help  when getting up, do not get up by yourself. Asking for help with getting up is for your safety, and the staff is there to help you.  Ask about which fall prevention strategies will be in place. Do not hesitate to speak up if you notice that the fall prevention plan has changed.  If you or a loved one falls, tell the hospital staff. This is important. This information is not intended to replace advice given to you by your health care provider. Make sure you discuss any questions you have with your health care provider. Document Revised: 01/10/2017 Document Reviewed: 09/11/2016 Elsevier Patient Education  2020 [place] Directive  Advance directives are legal documents that let you make choices ahead of time about your health care and medical treatment in case you become unable to communicate for yourself. Advance directives are a way for you to make known your wishes to family, friends, and health care providers. This can let others know about your end-of-life care if you become unable to communicate. Discussing and writing advance directives should happen over time rather than all at once. Advance directives can be changed  depending on your situation and what you want, even after you have signed the advance directives. There are different types of advance directives, such as:  Medical power of attorney.  Living will.  Do not resuscitate (DNR) or do not attempt resuscitation (DNAR) order. Health care proxy and medical power of attorney A health care proxy is also called a health care agent. This is a person who is appointed to make medical decisions for you in cases where you are unable to make the decisions yourself. Generally, people choose someone they know well and trust to represent their preferences. Make sure to ask this person for an agreement to act as your proxy. A proxy may have to exercise judgment in the event of a medical decision for which your wishes are not known. A medical power of attorney is a legal document that names your health care proxy. Depending on the laws in your state, after the document is written, it may also need to be:  Signed.  Notarized.  Dated.  Copied.  Witnessed.  Incorporated into your medical record. You may also want to appoint someone to manage your money in a situation in which you are unable to do so. This is called a durable power of attorney for finances. It is a separate legal document from the durable power of attorney for health care. You may choose the same person or someone different from your health care proxy to act as your agent in money matters. If you do not appoint a proxy, or if there is a concern that the proxy is not acting in your best interests, a court may appoint a guardian to act on your behalf. Living will A living will is a set of instructions that state your wishes about medical care when you cannot express them yourself. Health care providers should keep a copy of your living will in your medical record. You may want to give a copy to family members or friends. To alert caregivers in case of an emergency, you can place a card in your wallet  to let them know that you have a living will and where they can find it. A living will is used if you become:  Terminally ill.  Disabled.  Unable to communicate or make decisions. Items to consider in your living will  include:  To use or not to use life-support equipment, such as dialysis machines and breathing machines (ventilators).  A DNR or DNAR order. This tells health care providers not to use cardiopulmonary resuscitation (CPR) if breathing or heartbeat stops.  To use or not to use tube feeding.  To be given or not to be given food and fluids.  Comfort (palliative) care when the goal becomes comfort rather than a cure.  Donation of organs and tissues. A living will does not give instructions for distributing your money and property if you should pass away. DNR or DNAR A DNR or DNAR order is a request not to have CPR in the event that your heart stops beating or you stop breathing. If a DNR or DNAR order has not been made and shared, a health care provider will try to help any patient whose heart has stopped or who has stopped breathing. If you plan to have surgery, talk with your health care provider about how your DNR or DNAR order will be followed if problems occur. What if I do not have an advance directive? If you do not have an advance directive, some states assign family decision makers to act on your behalf based on how closely you are related to them. Each state has its own laws about advance directives. You may want to check with your health care provider, attorney, or state representative about the laws in your state. Summary  Advance directives are the legal documents that allow you to make choices ahead of time about your health care and medical treatment in case you become unable to tell others about your care.  The process of discussing and writing advance directives should happen over time. You can change the advance directives, even after you have signed  them.  Advance directives include DNR or DNAR orders, living wills, and designating an agent as your medical power of attorney. This information is not intended to replace advice given to you by your health care provider. Make sure you discuss any questions you have with your health care provider. Document Revised: 08/27/2018 Document Reviewed: 08/27/2018 Elsevier Patient Education  [place].

## 2019-11-17 NOTE — Telephone Encounter (Signed)
Pt called and states that the insurance will cover the freestyle meter the one that goes on her arm, please send to the [facility], [address] AT [place]

## 2019-11-18 ENCOUNTER — Other Ambulatory Visit: Payer: Self-pay | Admitting: Medical

## 2019-11-18 DIAGNOSIS — Z780 Asymptomatic menopausal state: Secondary | ICD-10-CM

## 2019-11-18 MED ORDER — CARVEDILOL 6.25 MG PO TABS: 6.2500 mg | ORAL_TABLET | Freq: Two times a day (BID) | ORAL | 0 refills | Status: DC

## 2019-11-18 MED ORDER — JANUMET 50-500 MG PO TABS: 1.0000 | ORAL_TABLET | Freq: Two times a day (BID) | ORAL | 1 refills | Status: DC

## 2019-11-18 MED ORDER — LOSARTAN POTASSIUM 100 MG PO TABS: 100.0000 mg | ORAL_TABLET | Freq: Every day | ORAL | 0 refills | Status: DC

## 2019-11-19 LAB — COLOGUARD

## 2019-11-22 ENCOUNTER — Encounter: Payer: Self-pay | Admitting: Gastroenterology

## 2019-11-22 ENCOUNTER — Other Ambulatory Visit: Payer: Self-pay | Admitting: Medical

## 2019-11-22 ENCOUNTER — Telehealth: Payer: Self-pay | Admitting: Medical

## 2019-11-22 ENCOUNTER — Telehealth: Payer: Self-pay | Admitting: *Deleted

## 2019-11-22 MED ORDER — DAPAGLIFLOZIN PROPANEDIOL 5 MG PO TABS: 5.0000 mg | ORAL_TABLET | Freq: Every day | ORAL | 1 refills | Status: DC

## 2019-11-22 NOTE — Telephone Encounter (Signed)
-----   Message from Doran Stabler, MD sent at 11/20/2019 11:37 PM EDT ----- Regarding: recall Darlene Wade,    Thanks for the check-in. That was the recommendation at the time. However, since then guidelines were updated and call for a 7 year recall for her (so Dec 2024).  We will change the recall and send her a letter with the update.  Thanks very much!  ___   [name],    Please send her our usual "new guidelines" letter and change colon recall from Dec 2022 to Dec 2024.  Thx  - HD ----- Message ----- From: Caryl Ada Sent: 11/17/2019   9:48 AM EDT To: Doran Stabler, MD  Hello Dr. Loletha Carrow, I hope you are doing well  I just want to confirm that I believe this patient is due for colonoscopy 2022 correct?  Thanks, Dorothea Ogle, PA-C

## 2019-11-22 NOTE — Telephone Encounter (Signed)
Please review previous message. 

## 2019-11-22 NOTE — Telephone Encounter (Signed)
If we have samples of Farxiga 5 mg, she can try these once daily.  It is a similar medicine so she could potentially have the same adverse effect but we will have to give this a try.  I would need her to let me know in a few weeks if this is working.  I will send this to the pharmacy to see if insurance covers it as well as samples if we have them  Please give her contact information for her cardiologist for follow-up (check Epic)

## 2019-11-22 NOTE — Telephone Encounter (Signed)
Recall changed in Epic for colonoscopy from 01/2021 to 01/2023 per new colonoscopy guidelines.

## 2019-11-22 NOTE — Telephone Encounter (Signed)
Yes begin samples of farxiga, and yes continue Janumet

## 2019-11-22 NOTE — Telephone Encounter (Signed)
Patient wants to start Wilder Glade and will pick up samples tomorrow. She can continue Janumet, correct?

## 2019-11-22 NOTE — Telephone Encounter (Signed)
Pt wants to try the [place], states the Jardiance causes yeast infections. Please let her know when samples are ready and said thank you.  Also she can't remember the cardiologist name, if you can let her know.

## 2019-11-23 NOTE — Telephone Encounter (Signed)
Patient advised and will pick up [place] today.

## 2019-11-23 NOTE — Telephone Encounter (Signed)
done

## 2019-12-23 ENCOUNTER — Telehealth: Payer: Self-pay | Admitting: Medical

## 2019-12-23 ENCOUNTER — Encounter: Payer: Self-pay | Admitting: Medical

## 2019-12-23 ENCOUNTER — Other Ambulatory Visit: Payer: Self-pay | Admitting: Medical

## 2019-12-23 MED ORDER — DAPAGLIFLOZIN PROPANEDIOL 5 MG PO TABS: 5.0000 mg | ORAL_TABLET | Freq: Every day | ORAL | 3 refills | Status: DC

## 2019-12-23 NOTE — Telephone Encounter (Signed)
Pt called and wants to see if she can get a refill on Farxiga. We dont have samples here at the moment. She uses the [facility] on [address]

## 2019-12-27 NOTE — Telephone Encounter (Signed)
[place] 90 days is $425 & $142 for 30 days.  Can't use discount card due to [facility] Medicare.  Called pt and advised have samples and she requested samples Janumet when we get them in.  She is in the donut hole and we will give her samples when we have them to help her out.

## 2020-01-04 ENCOUNTER — Encounter: Payer: Self-pay | Admitting: Cardiovascular Disease

## 2020-01-04 ENCOUNTER — Other Ambulatory Visit: Payer: Self-pay

## 2020-01-04 ENCOUNTER — Ambulatory Visit: Payer: Medicare Other | Admitting: Cardiovascular Disease

## 2020-01-04 VITALS — BP 100/72 | HR 74 | Ht 63.0 in | Wt 217.4 lb

## 2020-01-04 DIAGNOSIS — I5022 Chronic systolic (congestive) heart failure: Secondary | ICD-10-CM | POA: Diagnosis not present

## 2020-01-04 NOTE — Progress Notes (Signed)
Cardiology Office Note:    Date:  01/04/2020   ID:  Darlene Wade, DOB [birth year], MRN [id number]  PCP:  Carlena Hurl, PA-C  Cardiologist:  New to [place]  Electrophysiologist:  None   Referring MD: Carlena Hurl, PA-C   Problem List 1. Diabetes 2. HTN 3. Hyperlipidemia  4.  Osteoarthritis   Chief Complaint  Patient presents with  . Congestive Heart Failure     Dec. 2, 2019:    Darlene Wade is a 64 y.o. female with a hx of hypertension, hyperlipidemia, Diabetes  Her primary ordered on echo  She was found to have a very mildly reduced left ventricular systolic function ejection fraction of 40 to 45%.  She has grade 1 diastolic dysfunction.  Was exercising regularly in the gym but her knees started causing her lots of pain  Now she is walking 30 minutes a day  Drives a school bus   She does not eat  Lots of salt Has lost 40 lbs over the past several months - has been paying attention to her diet  Is also on Invokana   Nov. 23, 2021:  Darlene Wade is seen today for follow-up visit.  She has chronic combined systolic and diastolic congestive heart failure. Last echocardiogram was June, 2020.  She has a left ventricular systolic function with an EF of 40 to 45%.  At that time she had normal diastolic parameters. Has not gained any weight .   Trying to watch her diet .  Wt today is 217 lbs ( down 3 lbs since Dec. 2019. She has had knee surgery since then.   Thinking about getting the other knee done   Lipids are managed by her primary md, labs were reviewed ,  Look great    Past Medical History:  Diagnosis Date  . Asthma 07/20/2018   one puff per day  . Chronic combined systolic and diastolic heart failure ([place]) 03/03/2018   Echo 07/2018: EF 40-45, diff HK worse in Inf base, normal RVSF  . Diabetes mellitus without complication ([place]) 0932  . Diabetic eye exam [facility])    [facility]  . Dyspnea   . Elbow fracture, right 2007  . Former smoker    20 pack year  history, quit 2010  . Hyperlipidemia   . Hypertension   . Insomnia   . Lung nodule    Chest CT 07/2018:  RLL nodule resolved.  3 mm subpleural LUL nodule.  Repeat in 1 year if high risk.   . Obesity   . PONV (postoperative nausea and vomiting) 2014   1 time  . Wears glasses    reading    Past Surgical History:  Procedure Laterality Date  . BREAST CYST ASPIRATION  2015  . BREAST EXCISIONAL BIOPSY Left   . BREAST LUMPECTOMY WITH NEEDLE LOCALIZATION Left 10/07/2012   Procedure: BREAST LUMPECTOMY WITH NEEDLE LOCALIZATION;  Surgeon: Imogene Burn. Georgette Dover, MD;  Location: [place];  Service: General;  Laterality: Left;  . BREAST SURGERY    . COLONOSCOPY  01/2016   01/2016 with Dr. Loletha Carrow, tubular adenoma polpy; 2003 with Dr. Collene Mares  . ELBOW ARTHROPLASTY  2006   rt-fx  . FOOT ARTHROTOMY  11/12   foot fusion, right  . FOOT MASS EXCISION  3/14   left-fusion  . PARTIAL HYSTERECTOMY  age 46   uterine fibroids, still has ovaries  . TONSILLECTOMY    . TOTAL KNEE ARTHROPLASTY Left 08/10/2018   Procedure: Left Knee  Arthroplasty;  Surgeon: Frederik Pear, MD;  Location: [facility];  Service: Orthopedics;  Laterality: Left;    Current Medications: Current Meds  Medication Sig  . aspirin EC 81 MG tablet Take 1 tablet (81 mg total) by mouth daily.  Marland Kitchen atorvastatin (LIPITOR) 40 MG tablet Take 1 tablet (40 mg total) by mouth daily.  . Blood Glucose Monitoring Suppl (ACCU-CHEK AVIVA PLUS) w/Device KIT Test blood sugar 2 times daily  . carvedilol (COREG) 6.25 MG tablet Take 1 tablet (6.25 mg total) by mouth 2 (two) times daily.  . cholecalciferol (VITAMIN D3) 25 MCG (1000 UNIT) tablet Take 2 tablets (2,000 Units total) by mouth daily.  . dapagliflozin propanediol (FARXIGA) 5 MG TABS tablet Take 1 tablet (5 mg total) by mouth daily before breakfast.  . glucose blood test strip Test 1-2 times daily. Needs strips for ACCU CHEK Guide Device  . HYDROcodone-acetaminophen (NORCO) 5-325 MG tablet Take 1  tablet by mouth every 6 (six) hours as needed.  . Lancets (ACCU-CHEK SOFT TOUCH) lancets Use as instructed  . losartan (COZAAR) 100 MG tablet Take 1 tablet (100 mg total) by mouth daily.  . Multiple Vitamin (MULTIVITAMIN) tablet Take 1 tablet by mouth daily.  . sitaGLIPtin-metformin (JANUMET) 50-500 MG tablet Take 1 tablet by mouth 2 (two) times daily with a meal.   Current Facility-Administered Medications for the 01/04/20 encounter (Office Visit) with Ninoska Goswick, Wonda Cheng, MD  Medication  . 0.9 %  sodium chloride infusion     Allergies:   Oxycodone, Naproxen, and Vicodin [hydrocodone-acetaminophen]   Social History   Socioeconomic History  . Marital status: Married    Spouse name: Not on file  . Number of children: Not on file  . Years of education: Not on file  . Highest education level: Not on file  Occupational History  . Not on file  Tobacco Use  . Smoking status: Former Smoker    Packs/day: 1.00    Years: 20.00    Pack years: 20.00    Quit date: 04/11/2009    Years since quitting: 10.7  . Smokeless tobacco: Never Used  Vaping Use  . Vaping Use: Never used  Substance and Sexual Activity  . Alcohol use: Yes    Alcohol/week: 1.0 standard drink    Types: 1 Glasses of wine per week    Comment: rare  . Drug use: No  . Sexual activity: Not on file  Other Topics Concern  . Not on file  Social History Narrative   Separated from husband (2013). Living with husband.  Walking for exercise 2-3 x per week.  Retired, Set designer in the past.  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  Driving school bus.   11/2019   Social Determinants of Health   Financial Resource Strain:   . Difficulty of Paying Living Expenses: Not on file  Food Insecurity:   . Worried About Charity fundraiser in the Last Year: Not on file  . Ran Out of Food in the Last Year: Not on file  Transportation Needs:   . Lack of Transportation (Medical): Not on file  . Lack of Transportation (Non-Medical): Not on  file  Physical Activity:   . Days of Exercise per Week: Not on file  . Minutes of Exercise per Session: Not on file  Stress:   . Feeling of Stress : Not on file  Social Connections:   . Frequency of Communication with Friends and Family: Not on file  . Frequency of Social Gatherings with  Friends and Family: Not on file  . Attends Religious Services: Not on file  . Active Member of Clubs or Organizations: Not on file  . Attends Archivist Meetings: Not on file  . Marital Status: Not on file     Family History: The patient's family history includes Diabetes in her maternal aunt and mother; Heart disease in her maternal grandfather; Hypertension in her mother. There is no history of Cancer or Breast cancer.  ROS:   Please see the history of present illness.     All other systems reviewed and are negative.  EKGs/Labs/Other Studies Reviewed:    The following studies were reviewed today:   EKG:    Recent Labs: 11/17/2019: ALT 13; BUN 17; Creatinine, Ser 0.81; Hemoglobin 11.7; Platelets 245; Potassium 4.6; Sodium 141  Recent Lipid Panel    Component Value Date/Time   CHOL 126 06/02/2019 1124   TRIG 71 06/02/2019 1124   HDL 64 06/02/2019 1124   CHOLHDL 2.0 06/02/2019 1124   CHOLHDL 2.0 05/22/2016 1014   VLDL 10 05/22/2016 1014   LDLCALC 48 06/02/2019 1124    Physical Exam:    Physical Exam: Blood pressure 100/72, pulse 74, height $RemoveBe'5\' 3"'MQdBtwJBc$  (1.6 m), weight 217 lb 6.4 oz (98.6 kg), SpO2 97 %. Body mass index is 38.51 kg/m.  GEN:  moderately obese female,  NAD  HEENT: Normal NECK: No JVD; No carotid bruits LYMPHATICS: No lymphadenopathy CARDIAC: RRR , no murmurs, rubs, gallops RESPIRATORY:  Clear to auscultation without rales, wheezing or rhonchi  ABDOMEN: Soft, non-tender, non-distended MUSCULOSKELETAL:  No edema; No deformity  SKIN: Warm and dry NEUROLOGIC:  Alert and oriented x 3   ASSESSMENT:    1. Chronic systolic heart failure (HCC)    PLAN:        1.  Mild chronic ystolic  congestive heart failure:   She is doing well.   Cont current meds.   Advised continued weight loss.     2.  Hyperlipidemia: Her labs are managed by her primary medical doctor.  Continue current medications.  I have reviewed her lipids from April, 2021.  Triglyceride level is 71, LDL is 48, total cholesterol is 126, HDL 64.  His levels are great.  Continue with diet, exercise, weight loss.     Medication Adjustments/Labs and Tests Ordered: Current medicines are reviewed at length with the patient today.  Concerns regarding medicines are outlined above.  Orders Placed This Encounter  Procedures  . EKG 12-Lead   No orders of the defined types were placed in this encounter.    Patient Instructions  Medication Instructions:  Your provider recommends that you continue on your current medications as directed. Please refer to the Current Medication list given to you today.   *If you need a refill on your cardiac medications before your next appointment, please call your pharmacy*   Follow-Up: At [facility], you and your health needs are our priority.  As part of our continuing mission to provide you with exceptional heart care, we have created designated Provider Care Teams.  These Care Teams include your primary Cardiologist (physician) and Advanced Practice Providers (APPs -  Physician Assistants and Nurse Practitioners) who all work together to provide you with the care you need, when you need it. Your next appointment:   12 month(s) The format for your next appointment:   In Person Provider:   Richardson Dopp, PA-C     Signed, Mertie Moores, MD  01/04/2020 10:35 AM  [facility]

## 2020-01-04 NOTE — Patient Instructions (Signed)
Medication Instructions:  Your provider recommends that you continue on your current medications as directed. Please refer to the Current Medication list given to you today.   *If you need a refill on your cardiac medications before your next appointment, please call your pharmacy*   Follow-Up: At [facility], you and your health needs are our priority.  As part of our continuing mission to provide you with exceptional heart care, we have created designated Provider Care Teams.  These Care Teams include your primary Cardiologist (physician) and Advanced Practice Providers (APPs -  Physician Assistants and Nurse Practitioners) who all work together to provide you with the care you need, when you need it. Your next appointment:   12 month(s) The format for your next appointment:   In Person Provider:   Scott Weaver, PA-C 

## 2020-01-10 ENCOUNTER — Telehealth: Payer: Self-pay | Admitting: Medical

## 2020-01-10 NOTE — Telephone Encounter (Signed)
I received note from [facility] that she is not on statin. However she is in fact on Lipitor and this is sent to [facility] in general. Please fax this back to [facility]. Verify with patient she is actually taking the atorvastatin cholesterol medicine

## 2020-01-11 NOTE — Telephone Encounter (Signed)
Faxed form back and sent patient a message on mychart inquiring that she is taking Atorvastatin.

## 2020-01-12 ENCOUNTER — Other Ambulatory Visit: Payer: Self-pay | Admitting: Medical

## 2020-01-19 ENCOUNTER — Other Ambulatory Visit: Payer: Self-pay | Admitting: Medical

## 2020-02-12 HISTORY — PX: TOTAL KNEE ARTHROPLASTY: SHX125

## 2020-02-25 ENCOUNTER — Telehealth: Payer: Self-pay

## 2020-02-25 NOTE — Telephone Encounter (Signed)
Genera we just got in [place] samples today

## 2020-02-25 NOTE — Telephone Encounter (Signed)
Patient has been advised there is 1 box of Janumet 50/500. Patient was informed we do not have 5mg  [place]. Patient will have husband pick up sample.

## 2020-02-25 NOTE — Telephone Encounter (Signed)
Pt called asking if she can have samples Farxiga 5mg  & Janumet enough to last at least til end of month.

## 2020-02-28 NOTE — Telephone Encounter (Signed)
Was farxiga samples given to patient? At the time of request, there was only 10mg  farxiga in the back

## 2020-02-28 NOTE — Telephone Encounter (Signed)
I did not give her any, and I don't know if she came by yet.  A box of samples came in that day & I put the box in the bin so they hadn't been put on the shelf yet.  I called her and left her a message that we had some now.  So if you will put her some up tomorrow please and thank you

## 2020-02-29 NOTE — Telephone Encounter (Signed)
Mickel Baas sent message for patient to pick up samples of Farxiga

## 2020-03-11 NOTE — Telephone Encounter (Signed)
done

## 2020-04-03 ENCOUNTER — Other Ambulatory Visit: Payer: Self-pay

## 2020-04-03 ENCOUNTER — Other Ambulatory Visit: Payer: Self-pay | Admitting: Medical

## 2020-04-03 ENCOUNTER — Telehealth: Payer: Self-pay | Admitting: Medical

## 2020-04-03 MED ORDER — ATORVASTATIN CALCIUM 40 MG PO TABS
40.0000 mg | ORAL_TABLET | Freq: Every day | ORAL | 0 refills | Status: DC
Start: 2020-04-03 — End: 2020-05-11

## 2020-04-03 MED ORDER — LOSARTAN POTASSIUM 100 MG PO TABS
100.0000 mg | ORAL_TABLET | Freq: Every day | ORAL | 0 refills | Status: DC
Start: 2020-04-03 — End: 2020-05-04

## 2020-04-03 NOTE — Telephone Encounter (Signed)
Done

## 2020-04-03 NOTE — Telephone Encounter (Signed)
Pt called and states that she has new insurance and a new mail order pharmacy. She is in the process of getting that set up. She needs 30 days supply of Atorvastatin and losartan send in to a local pharmacy. Please send to [facility]. This will just one time until mail order info fixed.

## 2020-04-04 ENCOUNTER — Telehealth: Payer: Self-pay

## 2020-04-04 NOTE — Telephone Encounter (Signed)
Received fax from [facility] for a refill on Losartan, atorvastatin, and carvedilol pt. Last apt was 11/17/19.

## 2020-04-04 NOTE — Telephone Encounter (Signed)
Medication has been sent to local pharmacy.

## 2020-04-09 ENCOUNTER — Other Ambulatory Visit: Payer: Self-pay | Admitting: Cardiovascular Disease

## 2020-04-11 ENCOUNTER — Telehealth: Payer: Self-pay

## 2020-04-11 ENCOUNTER — Other Ambulatory Visit: Payer: Self-pay

## 2020-04-11 MED ORDER — CARVEDILOL 6.25 MG PO TABS
6.2500 mg | ORAL_TABLET | Freq: Two times a day (BID) | ORAL | 0 refills | Status: DC
Start: 2020-04-11 — End: 2020-06-27

## 2020-04-11 NOTE — Telephone Encounter (Signed)
Received fax from [place] [facility] for a refill on the pts. Losartan, atorvastatin, and carvedilol pt. Last apt was 11/17/19.

## 2020-04-12 ENCOUNTER — Other Ambulatory Visit: Payer: Self-pay | Admitting: Medical

## 2020-04-20 ENCOUNTER — Other Ambulatory Visit: Payer: Self-pay | Admitting: Medical

## 2020-04-20 ENCOUNTER — Telehealth: Payer: Self-pay | Admitting: Medical

## 2020-04-20 NOTE — Telephone Encounter (Signed)
Responded to electronically

## 2020-04-20 NOTE — Telephone Encounter (Signed)
Pt called and is requesting a refill on her accu check meter kit new meter strips and lancets  Please send to the [place],  - [address] AT [address]

## 2020-05-01 ENCOUNTER — Telehealth: Payer: Self-pay | Admitting: Medical

## 2020-05-01 NOTE — Telephone Encounter (Signed)
Pt called and requested samples of Farxiga. Please call [phone] when ready.

## 2020-05-01 NOTE — Telephone Encounter (Signed)
Patient has been informed that we have 1 box of farxiga 5mg  in office. She will come tomorrow to get it or will send her husband to pick it up today.

## 2020-05-03 ENCOUNTER — Other Ambulatory Visit: Payer: Self-pay | Admitting: Medical

## 2020-05-04 ENCOUNTER — Other Ambulatory Visit: Payer: Self-pay | Admitting: Medical

## 2020-05-04 NOTE — Telephone Encounter (Signed)
Sent message on mychart for patient to call and schedule med check appointment.

## 2020-05-11 ENCOUNTER — Other Ambulatory Visit: Payer: Self-pay | Admitting: Medical

## 2020-06-04 ENCOUNTER — Other Ambulatory Visit: Payer: Self-pay | Admitting: Medical

## 2020-06-05 ENCOUNTER — Other Ambulatory Visit: Payer: Self-pay | Admitting: Medical

## 2020-06-08 DIAGNOSIS — Z96652 Presence of left artificial knee joint: Secondary | ICD-10-CM | POA: Diagnosis not present

## 2020-06-08 DIAGNOSIS — M1711 Unilateral primary osteoarthritis, right knee: Secondary | ICD-10-CM | POA: Diagnosis not present

## 2020-06-13 ENCOUNTER — Other Ambulatory Visit: Payer: Self-pay

## 2020-06-13 ENCOUNTER — Encounter: Payer: Self-pay | Admitting: Medical

## 2020-06-13 ENCOUNTER — Ambulatory Visit (INDEPENDENT_AMBULATORY_CARE_PROVIDER_SITE_OTHER): Payer: Medicare HMO | Admitting: Medical

## 2020-06-13 VITALS — BP 138/86 | HR 69 | Ht 63.0 in | Wt 218.8 lb

## 2020-06-13 DIAGNOSIS — I10 Essential (primary) hypertension: Secondary | ICD-10-CM

## 2020-06-13 DIAGNOSIS — E785 Hyperlipidemia, unspecified: Secondary | ICD-10-CM

## 2020-06-13 DIAGNOSIS — Z136 Encounter for screening for cardiovascular disorders: Secondary | ICD-10-CM

## 2020-06-13 DIAGNOSIS — Z23 Encounter for immunization: Secondary | ICD-10-CM

## 2020-06-13 DIAGNOSIS — I5022 Chronic systolic (congestive) heart failure: Secondary | ICD-10-CM

## 2020-06-13 DIAGNOSIS — E118 Type 2 diabetes mellitus with unspecified complications: Secondary | ICD-10-CM

## 2020-06-13 DIAGNOSIS — I2510 Atherosclerotic heart disease of native coronary artery without angina pectoris: Secondary | ICD-10-CM

## 2020-06-13 DIAGNOSIS — M1712 Unilateral primary osteoarthritis, left knee: Secondary | ICD-10-CM

## 2020-06-13 DIAGNOSIS — Z01818 Encounter for other preprocedural examination: Secondary | ICD-10-CM

## 2020-06-13 DIAGNOSIS — Z79899 Other long term (current) drug therapy: Secondary | ICD-10-CM

## 2020-06-13 LAB — POCT GLYCOSYLATED HEMOGLOBIN (HGB A1C): Hemoglobin A1C: 6.5 % — AB (ref 4.0–5.6)

## 2020-06-13 NOTE — Progress Notes (Signed)
Subjective:  Darlene Wade is a 65 y.o. female who presents for Chief Complaint  Patient presents with  . Medication Management     Here for med check and preop.  She is having right knee replacement with Dr. Mayer Camel next month.  She will need preop labs within 30 days of the surgery  She is feeling good, no specific concerns.   Hypertension-compliant with medication.  Home blood pressure readings are around 130/80 regularly.  No chest pain, no palpitations, no edema  Diabetes-compliant with medications Janumet and Farxiga.  Blood sugars typically 1 10-1 20 fasting.  No foot concerns.  Walking for exercise.  She sees her eye doctor in June   Her insurance will approve Shingrix vaccine but she does not want to do this today  Hyperlipidemia-compliant with medication  No other aggravating or relieving factors.    No other c/o.  Past Medical History:  Diagnosis Date  . Asthma 07/20/2018   one puff per day  . Chronic combined systolic and diastolic heart failure ([place]) 03/03/2018   Echo 07/2018: EF 40-45, diff HK worse in Inf base, normal RVSF  . Diabetes mellitus without complication ([place]) 1859  . Diabetic eye exam [facility])    [facility]  . Dyspnea   . Elbow fracture, right 2007  . Former smoker    20 pack year history, quit 2010  . Hyperlipidemia   . Hypertension   . Insomnia   . Lung nodule    Chest CT 07/2018:  RLL nodule resolved.  3 mm subpleural LUL nodule.  Repeat in 1 year if high risk.   . Obesity   . PONV (postoperative nausea and vomiting) 2014   1 time  . Wears glasses    reading   Current Outpatient Medications on File Prior to Visit  Medication Sig Dispense Refill  . ACCU-CHEK GUIDE test strip TEST 1 TO 2 TIMES DAILY 100 strip 2  . Accu-Chek Softclix Lancets lancets USE AS DIRECTED 100 each 12  . aspirin EC 81 MG tablet Take 1 tablet (81 mg total) by mouth daily. 90 tablet 3  . atorvastatin (LIPITOR) 40 MG tablet TAKE 1 TABLET(40 MG) BY MOUTH DAILY 30 tablet  0  . Blood Glucose Monitoring Suppl (ACCU-CHEK AVIVA PLUS) w/Device KIT Test blood sugar 2 times daily 1 kit 0  . carvedilol (COREG) 6.25 MG tablet Take 1 tablet (6.25 mg total) by mouth 2 (two) times daily. 180 tablet 0  . cholecalciferol (VITAMIN D3) 25 MCG (1000 UNIT) tablet Take 2 tablets (2,000 Units total) by mouth daily. 180 tablet 3  . dapagliflozin propanediol (FARXIGA) 5 MG TABS tablet Take 1 tablet (5 mg total) by mouth daily before breakfast. 90 tablet 3  . losartan (COZAAR) 100 MG tablet TAKE 1 TABLET(100 MG) BY MOUTH DAILY 90 tablet 0  . Multiple Vitamin (MULTIVITAMIN) tablet Take 1 tablet by mouth daily.    . sitaGLIPtin-metformin (JANUMET) 50-500 MG tablet Take 1 tablet by mouth 2 (two) times daily with a meal. 180 tablet 1  . HYDROcodone-acetaminophen (NORCO) 5-325 MG tablet Take 1 tablet by mouth every 6 (six) hours as needed. (Patient not taking: Reported on 06/13/2020) 12 tablet 0   Current Facility-Administered Medications on File Prior to Visit  Medication Dose Route Frequency Provider Last Rate Last Admin  . 0.9 %  sodium chloride infusion  500 mL Intravenous Continuous Nelida Meuse III, MD         The following portions of the patient's history  were reviewed and updated as appropriate: allergies, current medications, past family history, past medical history, past social history, past surgical history and problem list.  ROS Otherwise as in subjective above  Objective: BP 140/86   Pulse 69   Ht '5\' 3"'  (1.6 m)   Wt 218 lb 12.8 oz (99.2 kg)   SpO2 98%   BMI 38.76 kg/m   General appearance: alert, no distress, well developed, well nourished HEENT: normocephalic, sclerae anicteric, conjunctiva pink and moist, TMs pearly, nares patent, no discharge or erythema, pharynx normal Oral cavity: MMM, no lesions, no small airway Neck: supple, no lymphadenopathy, no thyromegaly, no masses, no JVD Heart: RRR, normal S1, S2, no murmurs Lungs: CTA bilaterally, no wheezes,  rhonchi, or rales Abdomen: +bs, soft, non tender, non distended, no masses, no hepatomegaly, no splenomegaly Pulses: 2+ radial pulses, 1+ pedal pulses, normal cap refill Ext: no edema  Diabetic Foot Exam - Simple   Simple Foot Form Diabetic Foot exam was performed with the following findings: Yes 06/13/2020 12:12 PM  Visual Inspection See comments: Yes Sensation Testing Intact to touch and monofilament testing bilaterally: Yes Pulse Check See comments: Yes Comments 1+ pedal pulses, dorsal surgical scars bilat great toes and left great toe medial surgical scar       Assessment: Encounter Diagnoses  Name Primary?  . Preop examination Yes  . Diabetes mellitus with complication ([place])   . Essential hypertension, benign   . Need for pneumococcal vaccination   . Encounter for screening for vascular disease   . Coronary artery disease involving native coronary artery of native heart without angina pectoris   . Primary osteoarthritis of left knee   . Chronic systolic CHF (congestive heart failure) ([place])   . High risk medication use   . Hyperlipidemia, unspecified hyperlipidemia type      Plan: Chronic heart failure-I reviewed her cardiology notes from November 2021 with Dr. Acie Fredrickson.  Things are stable at that time and he advise she follow-up in 1 year  Diabetes-continue glucose monitoring, current medications, labs today.  Referral for ABI blood flow screen in the legs  Hypertension-continue current medication  Hyperlipidemia-continue current medication  CAD-continue statin  Arthritis of knee and knee for knee replacement-she will return in 2 weeks for preop labs.  She is over 30 days out from surgery currently.    Counseled on the pneumococcal vaccine.  Vaccine information sheet given.  Pneumococcal vaccine PPSV23 given after consent obtained.     Darlene Wade was seen today for medication management.  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

## 2020-06-16 ENCOUNTER — Other Ambulatory Visit: Payer: Self-pay

## 2020-06-16 ENCOUNTER — Ambulatory Visit (HOSPITAL_COMMUNITY)
Admission: RE | Admit: 2020-06-16 | Discharge: 2020-06-16 | Disposition: A | Discharge status: Home / Self Care | Payer: Medicare HMO | Source: Ambulatory Visit | Attending: Medical | Admitting: Medical

## 2020-06-16 DIAGNOSIS — Z136 Encounter for screening for cardiovascular disorders: Secondary | ICD-10-CM | POA: Insufficient documentation

## 2020-06-16 DIAGNOSIS — I10 Essential (primary) hypertension: Secondary | ICD-10-CM | POA: Diagnosis not present

## 2020-06-17 ENCOUNTER — Other Ambulatory Visit: Payer: Self-pay | Admitting: Medical

## 2020-06-26 ENCOUNTER — Other Ambulatory Visit: Payer: Medicare HMO

## 2020-06-26 DIAGNOSIS — Z01818 Encounter for other preprocedural examination: Secondary | ICD-10-CM

## 2020-06-26 DIAGNOSIS — Z79899 Other long term (current) drug therapy: Secondary | ICD-10-CM | POA: Diagnosis not present

## 2020-06-26 DIAGNOSIS — D539 Nutritional anemia, unspecified: Secondary | ICD-10-CM | POA: Diagnosis not present

## 2020-06-27 ENCOUNTER — Other Ambulatory Visit: Payer: Self-pay | Admitting: Medical

## 2020-06-27 LAB — CBC WITH DIFFERENTIAL/PLATELET
Basophils Absolute: 0 10*3/uL (ref 0.0–0.2)
Basos: 0 %
EOS (ABSOLUTE): 0.3 10*3/uL (ref 0.0–0.4)
Eos: 4 %
Hematocrit: 36.5 % (ref 34.0–46.6)
Hemoglobin: 11.9 g/dL (ref 11.1–15.9)
Immature Grans (Abs): 0 10*3/uL (ref 0.0–0.1)
Immature Granulocytes: 0 %
Lymphocytes Absolute: 2.4 10*3/uL (ref 0.7–3.1)
Lymphs: 31 %
MCH: 28.2 pg (ref 26.6–33.0)
MCHC: 32.6 g/dL (ref 31.5–35.7)
MCV: 87 fL (ref 79–97)
Monocytes Absolute: 0.6 10*3/uL (ref 0.1–0.9)
Monocytes: 8 %
Neutrophils Absolute: 4.5 10*3/uL (ref 1.4–7.0)
Neutrophils: 57 %
Platelets: 243 10*3/uL (ref 150–450)
RBC: 4.22 x10E6/uL (ref 3.77–5.28)
RDW: 14.5 % (ref 11.7–15.4)
WBC: 7.9 10*3/uL (ref 3.4–10.8)

## 2020-06-27 LAB — BASIC METABOLIC PANEL
BUN/Creatinine Ratio: 14 (ref 12–28)
BUN: 12 mg/dL (ref 8–27)
CO2: 23 mmol/L (ref 20–29)
Calcium: 9.9 mg/dL (ref 8.7–10.3)
Chloride: 103 mmol/L (ref 96–106)
Creatinine, Ser: 0.83 mg/dL (ref 0.57–1.00)
Glucose: 101 mg/dL — ABNORMAL HIGH (ref 65–99)
Potassium: 4.5 mmol/L (ref 3.5–5.2)
Sodium: 140 mmol/L (ref 134–144)
eGFR: 78 mL/min/{1.73_m2} (ref >59)

## 2020-06-27 LAB — PT AND PTT
INR: 1 (ref 0.9–1.2)
Prothrombin Time: 10.7 s (ref 9.1–12.0)
aPTT: 32 s (ref 24–33)

## 2020-06-27 MED ORDER — VITAMIN D 25 MCG (1000 UNIT) PO TABS: 2000.0000 [IU] | ORAL_TABLET | Freq: Every day | ORAL | 3 refills | Status: DC

## 2020-06-27 MED ORDER — CARVEDILOL 6.25 MG PO TABS: 6.2500 mg | ORAL_TABLET | Freq: Two times a day (BID) | ORAL | 3 refills | Status: DC

## 2020-06-27 MED ORDER — ASPIRIN EC 81 MG PO TBEC: 81.0000 mg | DELAYED_RELEASE_TABLET | Freq: Every day | ORAL | 3 refills | Status: DC

## 2020-06-27 MED ORDER — LOSARTAN POTASSIUM 100 MG PO TABS: ORAL_TABLET | ORAL | 3 refills | Status: DC

## 2020-06-27 MED ORDER — ATORVASTATIN CALCIUM 40 MG PO TABS: ORAL_TABLET | ORAL | 3 refills | Status: DC

## 2020-06-28 ENCOUNTER — Encounter: Payer: Self-pay | Admitting: Medical

## 2020-06-28 ENCOUNTER — Other Ambulatory Visit: Payer: Self-pay | Admitting: Medical

## 2020-06-29 ENCOUNTER — Telehealth: Payer: Self-pay | Admitting: Medical

## 2020-06-29 NOTE — Telephone Encounter (Signed)
Ortho sent over surgical clearance form put in your folder to be filled out

## 2020-06-30 ENCOUNTER — Telehealth: Payer: Self-pay | Admitting: *Deleted

## 2020-06-30 NOTE — Telephone Encounter (Signed)
Primary Cardiologist:Philip Nahser, MD  Chart reviewed as part of pre-operative protocol coverage. Because of Darlene Wade's past medical history and time since last visit, he/she will require a follow-up visit in order to better assess preoperative cardiovascular risk.  Pre-op covering staff: - Please schedule appointment and call patient to inform them. - Please contact requesting surgeon's office via preferred method (i.e, phone, fax) to inform them of need for appointment prior to surgery.  If applicable, this message will also be routed to pharmacy pool and/or primary cardiologist for input on holding anticoagulant/antiplatelet agent as requested below so that this information is available at time of patient's appointment.   Deberah Pelton, NP  06/30/2020, 2:44 PM

## 2020-06-30 NOTE — Telephone Encounter (Signed)
Form ready to fax back to ortho

## 2020-06-30 NOTE — Telephone Encounter (Signed)
   [place] Pre-operative Risk Assessment    Patient Name: Darlene Wade  DOB: 1955/08/17  MRN: [id number]   HEARTCARE STAFF: - Please ensure there is not already an duplicate clearance open for this procedure. - Under Visit Info/Reason for Call, type in Other and utilize the format Clearance MM/DD/YY or Clearance TBD. Do not use dashes or single digits. - If request is for dental extraction, please clarify the # of teeth to be extracted.  Request for surgical clearance:  1. What type of surgery is being performed? RIGHT KNEE ARTHROPLASTY   2. When is this surgery scheduled? 07/28/20   3. What type of clearance is required (medical clearance vs. Pharmacy clearance to hold med vs. Both)? MEDICAL  4. Are there any medications that need to be held prior to surgery and how long? ASA    5. Practice name and name of physician performing surgery? [facility]; DR. FRANK ROWAN   6. What is the office phone number? [phone]   7.   What is the office fax number? [phone] ATTN : [place]  8.   Anesthesia type (None, local, MAC, general) ? SPINAL   Julaine Hua 06/30/2020, 2:28 PM  _________________________________________________________________   (provider comments below)

## 2020-07-03 NOTE — Telephone Encounter (Signed)
Left message for pt to call back to schedule a pre op appt. In review of the provider schedules I did not see any available appt slots before pt's scheduled surgery  Date both at [facility] st and [facility] locations. I reviewed [place] schedule; would like to offer pt appt in [place] office with Christell Faith, [facility] 07/11/20 @ 2:30, if the pt is willing to travel to [facility] for her pre op appt.

## 2020-07-03 NOTE — Telephone Encounter (Signed)
Pt has been scheduled to see Almyra Deforest, [facility] 07/26/20 for pre op clearance. I will forward clearance notes to [facility] for upcoming appt. I will forward notes to requesting office pt has appt 07/26/20.

## 2020-07-04 ENCOUNTER — Telehealth: Payer: Self-pay | Admitting: Medical

## 2020-07-04 NOTE — Telephone Encounter (Signed)
I keep getting back surgery clearance request from [place].  I called their office and they still have not received anything from [place]  I sent to you on Thursday or Friday her clearance letter that I signed, and asked that her labs and recent office note be sent to Dr. Mayer Camel at [facility] orthopedics that she is cleared for surgery  Please make sure this has happened

## 2020-07-04 NOTE — Telephone Encounter (Signed)
I have sent all information as I was given the clearance form this morning. If I need to fax again I will.

## 2020-07-24 ENCOUNTER — Encounter: Payer: Self-pay | Admitting: Medical

## 2020-07-24 DIAGNOSIS — M1711 Unilateral primary osteoarthritis, right knee: Secondary | ICD-10-CM | POA: Diagnosis not present

## 2020-07-24 DIAGNOSIS — M25661 Stiffness of right knee, not elsewhere classified: Secondary | ICD-10-CM | POA: Diagnosis not present

## 2020-07-24 DIAGNOSIS — Z01810 Encounter for preprocedural cardiovascular examination: Secondary | ICD-10-CM | POA: Insufficient documentation

## 2020-07-25 ENCOUNTER — Other Ambulatory Visit: Payer: Self-pay | Admitting: Medical

## 2020-07-25 DIAGNOSIS — J4520 Mild intermittent asthma, uncomplicated: Secondary | ICD-10-CM

## 2020-07-25 DIAGNOSIS — M1711 Unilateral primary osteoarthritis, right knee: Secondary | ICD-10-CM | POA: Diagnosis not present

## 2020-07-26 ENCOUNTER — Other Ambulatory Visit: Payer: Self-pay | Admitting: Medical

## 2020-07-26 ENCOUNTER — Ambulatory Visit (INDEPENDENT_AMBULATORY_CARE_PROVIDER_SITE_OTHER): Payer: Medicare HMO | Admitting: Physician Assistant

## 2020-07-26 ENCOUNTER — Encounter: Payer: Self-pay | Admitting: Physician Assistant

## 2020-07-26 ENCOUNTER — Other Ambulatory Visit: Payer: Self-pay

## 2020-07-26 VITALS — BP 130/80 | HR 89 | Ht 63.0 in | Wt 215.0 lb

## 2020-07-26 DIAGNOSIS — E785 Hyperlipidemia, unspecified: Secondary | ICD-10-CM | POA: Diagnosis not present

## 2020-07-26 DIAGNOSIS — I5042 Chronic combined systolic (congestive) and diastolic (congestive) heart failure: Secondary | ICD-10-CM | POA: Diagnosis not present

## 2020-07-26 DIAGNOSIS — I10 Essential (primary) hypertension: Secondary | ICD-10-CM | POA: Diagnosis not present

## 2020-07-26 DIAGNOSIS — J4520 Mild intermittent asthma, uncomplicated: Secondary | ICD-10-CM

## 2020-07-26 DIAGNOSIS — Z01818 Encounter for other preprocedural examination: Secondary | ICD-10-CM

## 2020-07-26 DIAGNOSIS — E119 Type 2 diabetes mellitus without complications: Secondary | ICD-10-CM

## 2020-07-26 NOTE — Progress Notes (Signed)
Cardiology Office Note:    Date:  07/27/2020   ID:  Darlene Wade, DOB 12/11/1955, MRN [id number]  PCP:  Tysinger, David S, PA-C   [facility] Providers Cardiologist:  Philip Nahser, MD {   Referring MD: Tysinger, David S, PA-C   Chief Complaint  Patient presents with   Follow-up    Pre-op clearance.   Pre-op Exam    R knee surgery by Dr. Rowen    History of Present Illness:    Darlene Wade is a 64 y.o. female with a hx of hypertension, hyperlipidemia, DM 2 and chronic combined systolic and diastolic heart failure.  She was referred to cardiology service in 2019 after echocardiogram obtained on 11/30/2017 showed EF 40 to 45%, grade 1 DD.  Coronary CTA obtained in January 2020 showed mild nonobstructive CAD, coronary calcium score of 9 which placed the patient and 61st percentile for age and sex matched control.  Repeat echocardiogram obtained on 07/27/2018 continue to show EF 40 to 45%, diffuse hypokinesis worse in the inferior base.  Patient is scheduled to undergo right knee arthroscopy, she presents today for preoperative clearance.  Patient presents today for preoperative clearance.  She has upcoming right knee arthroscopy by Dr. Rowen.  She denies any recent chest discomfort or worsening dyspnea.  She has no obvious anginal symptoms.  EKG showed sinus rhythm, T wave inversion in lead III and aVF.  Although slightly more prominent compared to the previous EKG in 2021, previous EKG already showed T wave flattening.  Given lack of exertional chest discomfort, patient is cleared to proceed with knee surgery.  She has been holding aspirin.  She is due to see Dr. Nahser in November of this year.  Her lipid panel is overdue, I will defer annual lipid panel to her PCP.   Past Medical History:  Diagnosis Date   Asthma 07/20/2018   one puff per day   Chronic combined systolic and diastolic heart failure (HCC) 03/03/2018   Echo 07/2018: EF 40-45, diff HK worse in Inf base, normal RVSF    Diabetes mellitus without complication (HCC) 2012   Diabetic eye exam (HCC)    [employer]   Dyspnea    Elbow fracture, right 2007   Former smoker    20 pack year history, quit 2010   Hyperlipidemia    Hypertension    Insomnia    Lung nodule    Chest CT 07/2018:  RLL nodule resolved.  3 mm subpleural LUL nodule.  Repeat in 1 year if high risk.    Obesity    PONV (postoperative nausea and vomiting) 2014   1 time   Wears glasses    reading    Past Surgical History:  Procedure Laterality Date   BREAST CYST ASPIRATION  2015   BREAST EXCISIONAL BIOPSY Left    BREAST LUMPECTOMY WITH NEEDLE LOCALIZATION Left 10/07/2012   Procedure: BREAST LUMPECTOMY WITH NEEDLE LOCALIZATION;  Surgeon: Matthew K. Tsuei, MD;  Location: [facility];  Service: General;  Laterality: Left;   BREAST SURGERY     COLONOSCOPY  01/2016   01/2016 with Dr. Danis, tubular adenoma polpy; 2003 with Dr. Mann   ELBOW ARTHROPLASTY  2006   rt-fx   FOOT ARTHROTOMY  11/12   foot fusion, right   FOOT MASS EXCISION  3/14   left-fusion   PARTIAL HYSTERECTOMY  age 28   uterine fibroids, still has ovaries   TONSILLECTOMY     TOTAL KNEE ARTHROPLASTY Left 08/10/2018     Procedure: Left Knee Arthroplasty;  Surgeon: Frederik Pear, MD;  Location: [facility];  Service: Orthopedics;  Laterality: Left;    Current Medications: Current Meds  Medication Sig   ACCU-CHEK GUIDE test strip TEST 1 TO 2 TIMES DAILY   Accu-Chek Softclix Lancets lancets USE AS DIRECTED   aspirin EC 81 MG tablet Take 1 tablet (81 mg total) by mouth daily.   atorvastatin (LIPITOR) 40 MG tablet TAKE 1 TABLET(40 MG) BY MOUTH DAILY   Blood Glucose Monitoring Suppl (ACCU-CHEK AVIVA PLUS) w/Device KIT Test blood sugar 2 times daily   carvedilol (COREG) 6.25 MG tablet Take 1 tablet (6.25 mg total) by mouth 2 (two) times daily.   cholecalciferol (VITAMIN D3) 25 MCG (1000 UNIT) tablet Take 2 tablets (2,000 Units total) by mouth daily.   dapagliflozin  propanediol (FARXIGA) 5 MG TABS tablet Take 1 tablet (5 mg total) by mouth daily before breakfast.   JANUMET 50-500 MG tablet TAKE 1 TABLET BY MOUTH TWICE DAILY WITH A MEAL   losartan (COZAAR) 100 MG tablet TAKE 1 TABLET(100 MG) BY MOUTH DAILY   Multiple Vitamin (MULTIVITAMIN) tablet Take 1 tablet by mouth daily.   Current Facility-Administered Medications for the 07/26/20 encounter (Office Visit) with Almyra Deforest, PA  Medication   0.9 %  sodium chloride infusion     Allergies:   Naproxen   Social History   Socioeconomic History   Marital status: Married    Spouse name: Not on file   Number of children: Not on file   Years of education: Not on file   Highest education level: Not on file  Occupational History   Not on file  Tobacco Use   Smoking status: Former    Packs/day: 1.00    Years: 20.00    Pack years: 20.00    Types: Cigarettes    Quit date: 04/11/2009    Years since quitting: 11.3   Smokeless tobacco: Never  Vaping Use   Vaping Use: Never used  Substance and Sexual Activity   Alcohol use: Yes    Alcohol/week: 1.0 standard drink    Types: 1 Glasses of wine per week    Comment: rare   Drug use: No   Sexual activity: Not on file  Other Topics Concern   Not on file  Social History Narrative   Separated from husband (2013). Living with husband.  Walking for exercise 2-3 x per week.  Retired, Set designer in the past.  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  Driving school bus.   11/2019   Social Determinants of Health   Financial Resource Strain: Not on file  Food Insecurity: Not on file  Transportation Needs: Not on file  Physical Activity: Not on file  Stress: Not on file  Social Connections: Not on file     Family History: The patient's family history includes Diabetes in her maternal aunt and mother; Heart disease in her maternal grandfather; Hypertension in her mother. There is no history of Cancer or Breast cancer.  ROS:   Please see the history of present  illness.     All other systems reviewed and are negative.  EKGs/Labs/Other Studies Reviewed:    The following studies were reviewed today:  Echo 07/27/2018 IMPRESSIONS     1. The left ventricle has mild-moderately reduced systolic function, with an ejection fraction of 40-45%. The cavity size was mildly dilated. Left ventricular diastolic parameters were normal.   2. Diffuse hypokinesis worse in the inferior base Abnormal septal motion.  3. The right ventricle has normal systolic function. The cavity was normal. There is no increase in right ventricular wall thickness.   4. Mild thickening of the mitral valve leaflet.   5. The aortic valve is tricuspid. Mild thickening of the aortic valve. Mild calcification of the aortic valve.   EKG:  EKG is ordered today.  The ekg ordered today demonstrates sinus rhythm with T wave inversion in lead III and aVF  Recent Labs: 11/17/2019: ALT 13 06/26/2020: BUN 12; Creatinine, Ser 0.83; Hemoglobin 11.9; Platelets 243; Potassium 4.5; Sodium 140  Recent Lipid Panel    Component Value Date/Time   CHOL 126 06/02/2019 1124   TRIG 71 06/02/2019 1124   HDL 64 06/02/2019 1124   CHOLHDL 2.0 06/02/2019 1124   CHOLHDL 2.0 05/22/2016 1014   VLDL 10 05/22/2016 1014   LDLCALC 48 06/02/2019 1124     Risk Assessment/Calculations:       Physical Exam:    VS:  BP 130/80 (BP Location: Left Arm, Patient Position: Sitting, Cuff Size: Large)   Pulse 89   Ht 5' 3" (1.6 m)   Wt 215 lb (97.5 kg)   BMI 38.09 kg/m     Wt Readings from Last 3 Encounters:  07/26/20 215 lb (97.5 kg)  06/13/20 218 lb 12.8 oz (99.2 kg)  01/04/20 217 lb 6.4 oz (98.6 kg)     GEN:  Well nourished, well developed in no acute distress HEENT: Normal NECK: No JVD; No carotid bruits LYMPHATICS: No lymphadenopathy CARDIAC: RRR, no murmurs, rubs, gallops RESPIRATORY:  Clear to auscultation without rales, wheezing or rhonchi  ABDOMEN: Soft, non-tender, non-distended MUSCULOSKELETAL:   No edema; No deformity  SKIN: Warm and dry NEUROLOGIC:  Alert and oriented x 3 PSYCHIATRIC:  Normal affect   ASSESSMENT:    1. Preop examination   2. Primary hypertension   3. Hyperlipidemia LDL goal <70   4. Controlled type 2 diabetes mellitus without complication, without long-term current use of insulin ([place])   5. Chronic combined systolic and diastolic heart failure (HCC)    PLAN:    In order of problems listed above:  Preoperative clearance: Previous coronary CTA in January 2020 showed mild nonobstructive disease.  Last echocardiogram in June 2020 showed EF 40 to 45%.  She appears to be euvolemic on physical exam.  She denies any chest pain.  Although there is more prominent T wave inversion in lead III and aVF, she already had T wave flattening on the previous EKG.  Given lack of chest discomfort, she is cleared to proceed with right knee surgery  Hypertension: Blood pressure stable  Hyperlipidemia: Overdue for lipid panel, will defer to PCP.  Continue on Lipitor  DM2: Managed by primary care provider  Chronic combined systolic and diastolic heart failure: Euvolemic on exam.  History of nonischemic cardiomyopathy.        Medication Adjustments/Labs and Tests Ordered: Current medicines are reviewed at length with the patient today.  Concerns regarding medicines are outlined above.  Orders Placed This Encounter  Procedures   EKG 12-Lead   No orders of the defined types were placed in this encounter.   Patient Instructions  Medication Instructions:  Your physician recommends that you continue on your current medications as directed. Please refer to the Current Medication list given to you today.  *If you need a refill on your cardiac medications before your next appointment, please call your pharmacy*  Lab Work: NONE ordered at this time of appointment   If  you have labs (blood work) drawn today and your tests are completely normal, you will receive your results  only by: MyChart Message (if you have MyChart) OR A paper copy in the mail If you have any lab test that is abnormal or we need to change your treatment, we will call you to review the results.  Testing/Procedures: NONE ordered at this time of appointment   Follow-Up: At [facility], you and your health needs are our priority.  As part of our continuing mission to provide you with exceptional heart care, we have created designated Provider Care Teams.  These Care Teams include your primary Cardiologist (physician) and Advanced Practice Providers (APPs -  Physician Assistants and Nurse Practitioners) who all work together to provide you with the care you need, when you need it.  Your next appointment:   5 month(s)  The format for your next appointment:   In Person  Provider:   Philip Nahser, MD  Other Instructions   Signed, Hao Meng, PA  07/27/2020 9:19 PM    [facility]  

## 2020-07-26 NOTE — Patient Instructions (Signed)
Medication Instructions:  Your physician recommends that you continue on your current medications as directed. Please refer to the Current Medication list given to you today.  *If you need a refill on your cardiac medications before your next appointment, please call your pharmacy*  Lab Work: NONE ordered at this time of appointment   If you have labs (blood work) drawn today and your tests are completely normal, you will receive your results only by: [place] (if you have MyChart) OR A paper copy in the mail If you have any lab test that is abnormal or we need to change your treatment, we will call you to review the results.  Testing/Procedures: NONE ordered at this time of appointment   Follow-Up: At [facility], you and your health needs are our priority.  As part of our continuing mission to provide you with exceptional heart care, we have created designated Provider Care Teams.  These Care Teams include your primary Cardiologist (physician) and Advanced Practice Providers (APPs -  Physician Assistants and Nurse Practitioners) who all work together to provide you with the care you need, when you need it.  Your next appointment:   5 month(s)  The format for your next appointment:   In Person  Provider:   Mertie Moores, MD  Other Instructions

## 2020-07-27 ENCOUNTER — Encounter: Payer: Self-pay | Admitting: Physician Assistant

## 2020-07-27 LAB — HM DIABETES EYE EXAM

## 2020-07-28 DIAGNOSIS — M1711 Unilateral primary osteoarthritis, right knee: Secondary | ICD-10-CM | POA: Diagnosis not present

## 2020-07-28 DIAGNOSIS — M21161 Varus deformity, not elsewhere classified, right knee: Secondary | ICD-10-CM | POA: Diagnosis not present

## 2020-07-28 DIAGNOSIS — Z96651 Presence of right artificial knee joint: Secondary | ICD-10-CM | POA: Diagnosis not present

## 2020-07-28 DIAGNOSIS — G8918 Other acute postprocedural pain: Secondary | ICD-10-CM | POA: Diagnosis not present

## 2020-07-29 DIAGNOSIS — E119 Type 2 diabetes mellitus without complications: Secondary | ICD-10-CM | POA: Diagnosis not present

## 2020-07-29 DIAGNOSIS — Z96653 Presence of artificial knee joint, bilateral: Secondary | ICD-10-CM | POA: Diagnosis not present

## 2020-07-29 DIAGNOSIS — I10 Essential (primary) hypertension: Secondary | ICD-10-CM | POA: Diagnosis not present

## 2020-07-29 DIAGNOSIS — Z7982 Long term (current) use of aspirin: Secondary | ICD-10-CM | POA: Diagnosis not present

## 2020-07-29 DIAGNOSIS — E785 Hyperlipidemia, unspecified: Secondary | ICD-10-CM | POA: Diagnosis not present

## 2020-07-29 DIAGNOSIS — Z471 Aftercare following joint replacement surgery: Secondary | ICD-10-CM | POA: Diagnosis not present

## 2020-07-31 ENCOUNTER — Other Ambulatory Visit: Payer: Self-pay | Admitting: Medical

## 2020-07-31 DIAGNOSIS — I10 Essential (primary) hypertension: Secondary | ICD-10-CM

## 2020-07-31 DIAGNOSIS — E119 Type 2 diabetes mellitus without complications: Secondary | ICD-10-CM | POA: Diagnosis not present

## 2020-07-31 DIAGNOSIS — E785 Hyperlipidemia, unspecified: Secondary | ICD-10-CM | POA: Diagnosis not present

## 2020-07-31 DIAGNOSIS — Z471 Aftercare following joint replacement surgery: Secondary | ICD-10-CM | POA: Diagnosis not present

## 2020-07-31 DIAGNOSIS — Z96653 Presence of artificial knee joint, bilateral: Secondary | ICD-10-CM | POA: Diagnosis not present

## 2020-07-31 DIAGNOSIS — Z7982 Long term (current) use of aspirin: Secondary | ICD-10-CM | POA: Diagnosis not present

## 2020-08-01 NOTE — Progress Notes (Signed)
Patient has been advised via mychart to call and schedule her nurse visit.

## 2020-08-02 DIAGNOSIS — E119 Type 2 diabetes mellitus without complications: Secondary | ICD-10-CM | POA: Diagnosis not present

## 2020-08-02 DIAGNOSIS — Z96653 Presence of artificial knee joint, bilateral: Secondary | ICD-10-CM | POA: Diagnosis not present

## 2020-08-02 DIAGNOSIS — Z471 Aftercare following joint replacement surgery: Secondary | ICD-10-CM | POA: Diagnosis not present

## 2020-08-02 DIAGNOSIS — E785 Hyperlipidemia, unspecified: Secondary | ICD-10-CM | POA: Diagnosis not present

## 2020-08-02 DIAGNOSIS — I10 Essential (primary) hypertension: Secondary | ICD-10-CM | POA: Diagnosis not present

## 2020-08-02 DIAGNOSIS — Z7982 Long term (current) use of aspirin: Secondary | ICD-10-CM | POA: Diagnosis not present

## 2020-08-04 DIAGNOSIS — Z471 Aftercare following joint replacement surgery: Secondary | ICD-10-CM | POA: Diagnosis not present

## 2020-08-04 DIAGNOSIS — Z7982 Long term (current) use of aspirin: Secondary | ICD-10-CM | POA: Diagnosis not present

## 2020-08-04 DIAGNOSIS — E119 Type 2 diabetes mellitus without complications: Secondary | ICD-10-CM | POA: Diagnosis not present

## 2020-08-04 DIAGNOSIS — Z96653 Presence of artificial knee joint, bilateral: Secondary | ICD-10-CM | POA: Diagnosis not present

## 2020-08-04 DIAGNOSIS — E785 Hyperlipidemia, unspecified: Secondary | ICD-10-CM | POA: Diagnosis not present

## 2020-08-04 DIAGNOSIS — I10 Essential (primary) hypertension: Secondary | ICD-10-CM | POA: Diagnosis not present

## 2020-08-07 DIAGNOSIS — E119 Type 2 diabetes mellitus without complications: Secondary | ICD-10-CM | POA: Diagnosis not present

## 2020-08-07 DIAGNOSIS — E785 Hyperlipidemia, unspecified: Secondary | ICD-10-CM | POA: Diagnosis not present

## 2020-08-07 DIAGNOSIS — Z96653 Presence of artificial knee joint, bilateral: Secondary | ICD-10-CM | POA: Diagnosis not present

## 2020-08-07 DIAGNOSIS — Z471 Aftercare following joint replacement surgery: Secondary | ICD-10-CM | POA: Diagnosis not present

## 2020-08-07 DIAGNOSIS — Z7982 Long term (current) use of aspirin: Secondary | ICD-10-CM | POA: Diagnosis not present

## 2020-08-07 DIAGNOSIS — I10 Essential (primary) hypertension: Secondary | ICD-10-CM | POA: Diagnosis not present

## 2020-08-08 ENCOUNTER — Encounter: Payer: Self-pay | Admitting: Internal Medicine

## 2020-08-08 DIAGNOSIS — Z471 Aftercare following joint replacement surgery: Secondary | ICD-10-CM | POA: Diagnosis not present

## 2020-08-08 DIAGNOSIS — M25661 Stiffness of right knee, not elsewhere classified: Secondary | ICD-10-CM | POA: Diagnosis not present

## 2020-08-08 DIAGNOSIS — Z96651 Presence of right artificial knee joint: Secondary | ICD-10-CM | POA: Diagnosis not present

## 2020-08-08 DIAGNOSIS — M6281 Muscle weakness (generalized): Secondary | ICD-10-CM | POA: Diagnosis not present

## 2020-08-08 DIAGNOSIS — R262 Difficulty in walking, not elsewhere classified: Secondary | ICD-10-CM | POA: Diagnosis not present

## 2020-08-09 ENCOUNTER — Encounter: Payer: Self-pay | Admitting: Medical

## 2020-08-11 DIAGNOSIS — R262 Difficulty in walking, not elsewhere classified: Secondary | ICD-10-CM | POA: Diagnosis not present

## 2020-08-11 DIAGNOSIS — Z96651 Presence of right artificial knee joint: Secondary | ICD-10-CM | POA: Diagnosis not present

## 2020-08-11 DIAGNOSIS — M25661 Stiffness of right knee, not elsewhere classified: Secondary | ICD-10-CM | POA: Diagnosis not present

## 2020-08-11 DIAGNOSIS — M6281 Muscle weakness (generalized): Secondary | ICD-10-CM | POA: Diagnosis not present

## 2020-08-15 DIAGNOSIS — R262 Difficulty in walking, not elsewhere classified: Secondary | ICD-10-CM | POA: Diagnosis not present

## 2020-08-15 DIAGNOSIS — M25661 Stiffness of right knee, not elsewhere classified: Secondary | ICD-10-CM | POA: Diagnosis not present

## 2020-08-15 DIAGNOSIS — Z96651 Presence of right artificial knee joint: Secondary | ICD-10-CM | POA: Diagnosis not present

## 2020-08-15 DIAGNOSIS — M6281 Muscle weakness (generalized): Secondary | ICD-10-CM | POA: Diagnosis not present

## 2020-08-17 DIAGNOSIS — Z96651 Presence of right artificial knee joint: Secondary | ICD-10-CM | POA: Diagnosis not present

## 2020-08-17 DIAGNOSIS — M6281 Muscle weakness (generalized): Secondary | ICD-10-CM | POA: Diagnosis not present

## 2020-08-17 DIAGNOSIS — M25661 Stiffness of right knee, not elsewhere classified: Secondary | ICD-10-CM | POA: Diagnosis not present

## 2020-08-17 DIAGNOSIS — R262 Difficulty in walking, not elsewhere classified: Secondary | ICD-10-CM | POA: Diagnosis not present

## 2020-08-23 ENCOUNTER — Other Ambulatory Visit: Payer: Self-pay | Admitting: Medical

## 2020-08-23 DIAGNOSIS — M6281 Muscle weakness (generalized): Secondary | ICD-10-CM | POA: Diagnosis not present

## 2020-08-23 DIAGNOSIS — R262 Difficulty in walking, not elsewhere classified: Secondary | ICD-10-CM | POA: Diagnosis not present

## 2020-08-23 DIAGNOSIS — M25661 Stiffness of right knee, not elsewhere classified: Secondary | ICD-10-CM | POA: Diagnosis not present

## 2020-08-23 DIAGNOSIS — Z96651 Presence of right artificial knee joint: Secondary | ICD-10-CM | POA: Diagnosis not present

## 2020-08-23 NOTE — Telephone Encounter (Signed)
Pt was prescribed this back 11/21 for a year

## 2020-08-25 DIAGNOSIS — M25661 Stiffness of right knee, not elsewhere classified: Secondary | ICD-10-CM | POA: Diagnosis not present

## 2020-08-25 DIAGNOSIS — M6281 Muscle weakness (generalized): Secondary | ICD-10-CM | POA: Diagnosis not present

## 2020-08-25 DIAGNOSIS — R262 Difficulty in walking, not elsewhere classified: Secondary | ICD-10-CM | POA: Diagnosis not present

## 2020-08-25 DIAGNOSIS — Z96651 Presence of right artificial knee joint: Secondary | ICD-10-CM | POA: Diagnosis not present

## 2020-08-29 DIAGNOSIS — R262 Difficulty in walking, not elsewhere classified: Secondary | ICD-10-CM | POA: Diagnosis not present

## 2020-08-29 DIAGNOSIS — M25661 Stiffness of right knee, not elsewhere classified: Secondary | ICD-10-CM | POA: Diagnosis not present

## 2020-08-29 DIAGNOSIS — M6281 Muscle weakness (generalized): Secondary | ICD-10-CM | POA: Diagnosis not present

## 2020-08-29 DIAGNOSIS — Z96651 Presence of right artificial knee joint: Secondary | ICD-10-CM | POA: Diagnosis not present

## 2020-08-31 DIAGNOSIS — Z96651 Presence of right artificial knee joint: Secondary | ICD-10-CM | POA: Diagnosis not present

## 2020-08-31 DIAGNOSIS — M6281 Muscle weakness (generalized): Secondary | ICD-10-CM | POA: Diagnosis not present

## 2020-08-31 DIAGNOSIS — R262 Difficulty in walking, not elsewhere classified: Secondary | ICD-10-CM | POA: Diagnosis not present

## 2020-08-31 DIAGNOSIS — M25661 Stiffness of right knee, not elsewhere classified: Secondary | ICD-10-CM | POA: Diagnosis not present

## 2020-09-05 DIAGNOSIS — Z96651 Presence of right artificial knee joint: Secondary | ICD-10-CM | POA: Diagnosis not present

## 2020-09-05 DIAGNOSIS — M6281 Muscle weakness (generalized): Secondary | ICD-10-CM | POA: Diagnosis not present

## 2020-09-05 DIAGNOSIS — M25661 Stiffness of right knee, not elsewhere classified: Secondary | ICD-10-CM | POA: Diagnosis not present

## 2020-09-05 DIAGNOSIS — R262 Difficulty in walking, not elsewhere classified: Secondary | ICD-10-CM | POA: Diagnosis not present

## 2020-09-07 DIAGNOSIS — R262 Difficulty in walking, not elsewhere classified: Secondary | ICD-10-CM | POA: Diagnosis not present

## 2020-09-07 DIAGNOSIS — M6281 Muscle weakness (generalized): Secondary | ICD-10-CM | POA: Diagnosis not present

## 2020-09-07 DIAGNOSIS — Z96651 Presence of right artificial knee joint: Secondary | ICD-10-CM | POA: Diagnosis not present

## 2020-09-07 DIAGNOSIS — M25661 Stiffness of right knee, not elsewhere classified: Secondary | ICD-10-CM | POA: Diagnosis not present

## 2020-09-11 DIAGNOSIS — R262 Difficulty in walking, not elsewhere classified: Secondary | ICD-10-CM | POA: Diagnosis not present

## 2020-09-11 DIAGNOSIS — Z96651 Presence of right artificial knee joint: Secondary | ICD-10-CM | POA: Diagnosis not present

## 2020-09-11 DIAGNOSIS — M6281 Muscle weakness (generalized): Secondary | ICD-10-CM | POA: Diagnosis not present

## 2020-09-11 DIAGNOSIS — M25661 Stiffness of right knee, not elsewhere classified: Secondary | ICD-10-CM | POA: Diagnosis not present

## 2020-09-14 DIAGNOSIS — H52223 Regular astigmatism, bilateral: Secondary | ICD-10-CM | POA: Diagnosis not present

## 2020-09-14 DIAGNOSIS — E119 Type 2 diabetes mellitus without complications: Secondary | ICD-10-CM | POA: Diagnosis not present

## 2020-09-14 LAB — HM DIABETES EYE EXAM

## 2020-09-15 DIAGNOSIS — Z96651 Presence of right artificial knee joint: Secondary | ICD-10-CM | POA: Diagnosis not present

## 2020-09-15 DIAGNOSIS — M25661 Stiffness of right knee, not elsewhere classified: Secondary | ICD-10-CM | POA: Diagnosis not present

## 2020-09-15 DIAGNOSIS — R262 Difficulty in walking, not elsewhere classified: Secondary | ICD-10-CM | POA: Diagnosis not present

## 2020-09-15 DIAGNOSIS — M6281 Muscle weakness (generalized): Secondary | ICD-10-CM | POA: Diagnosis not present

## 2020-09-19 ENCOUNTER — Encounter: Payer: Self-pay | Admitting: Medical

## 2020-09-19 DIAGNOSIS — R262 Difficulty in walking, not elsewhere classified: Secondary | ICD-10-CM | POA: Diagnosis not present

## 2020-09-19 DIAGNOSIS — Z96651 Presence of right artificial knee joint: Secondary | ICD-10-CM | POA: Diagnosis not present

## 2020-09-19 DIAGNOSIS — M25661 Stiffness of right knee, not elsewhere classified: Secondary | ICD-10-CM | POA: Diagnosis not present

## 2020-09-19 DIAGNOSIS — M6281 Muscle weakness (generalized): Secondary | ICD-10-CM | POA: Diagnosis not present

## 2020-09-21 DIAGNOSIS — Z96651 Presence of right artificial knee joint: Secondary | ICD-10-CM | POA: Diagnosis not present

## 2020-09-21 DIAGNOSIS — M6281 Muscle weakness (generalized): Secondary | ICD-10-CM | POA: Diagnosis not present

## 2020-09-21 DIAGNOSIS — M25661 Stiffness of right knee, not elsewhere classified: Secondary | ICD-10-CM | POA: Diagnosis not present

## 2020-09-21 DIAGNOSIS — R262 Difficulty in walking, not elsewhere classified: Secondary | ICD-10-CM | POA: Diagnosis not present

## 2020-09-26 DIAGNOSIS — Z96651 Presence of right artificial knee joint: Secondary | ICD-10-CM | POA: Diagnosis not present

## 2020-09-26 DIAGNOSIS — R262 Difficulty in walking, not elsewhere classified: Secondary | ICD-10-CM | POA: Diagnosis not present

## 2020-09-26 DIAGNOSIS — M6281 Muscle weakness (generalized): Secondary | ICD-10-CM | POA: Diagnosis not present

## 2020-09-26 DIAGNOSIS — M25661 Stiffness of right knee, not elsewhere classified: Secondary | ICD-10-CM | POA: Diagnosis not present

## 2020-10-03 DIAGNOSIS — M25661 Stiffness of right knee, not elsewhere classified: Secondary | ICD-10-CM | POA: Diagnosis not present

## 2020-10-03 DIAGNOSIS — Z96651 Presence of right artificial knee joint: Secondary | ICD-10-CM | POA: Diagnosis not present

## 2020-10-03 DIAGNOSIS — M6281 Muscle weakness (generalized): Secondary | ICD-10-CM | POA: Diagnosis not present

## 2020-10-03 DIAGNOSIS — R262 Difficulty in walking, not elsewhere classified: Secondary | ICD-10-CM | POA: Diagnosis not present

## 2020-10-23 DIAGNOSIS — R262 Difficulty in walking, not elsewhere classified: Secondary | ICD-10-CM | POA: Diagnosis not present

## 2020-10-23 DIAGNOSIS — M6281 Muscle weakness (generalized): Secondary | ICD-10-CM | POA: Diagnosis not present

## 2020-10-23 DIAGNOSIS — M25661 Stiffness of right knee, not elsewhere classified: Secondary | ICD-10-CM | POA: Diagnosis not present

## 2020-10-23 DIAGNOSIS — Z96651 Presence of right artificial knee joint: Secondary | ICD-10-CM | POA: Diagnosis not present

## 2020-11-01 DIAGNOSIS — M25661 Stiffness of right knee, not elsewhere classified: Secondary | ICD-10-CM | POA: Diagnosis not present

## 2020-11-01 DIAGNOSIS — M6281 Muscle weakness (generalized): Secondary | ICD-10-CM | POA: Diagnosis not present

## 2020-11-01 DIAGNOSIS — Z96651 Presence of right artificial knee joint: Secondary | ICD-10-CM | POA: Diagnosis not present

## 2020-11-01 DIAGNOSIS — R262 Difficulty in walking, not elsewhere classified: Secondary | ICD-10-CM | POA: Diagnosis not present

## 2020-11-08 ENCOUNTER — Other Ambulatory Visit: Payer: Self-pay | Admitting: Medical

## 2020-11-08 DIAGNOSIS — Z1231 Encounter for screening mammogram for malignant neoplasm of breast: Secondary | ICD-10-CM

## 2020-11-28 ENCOUNTER — Other Ambulatory Visit (INDEPENDENT_AMBULATORY_CARE_PROVIDER_SITE_OTHER): Payer: Medicare HMO

## 2020-11-28 ENCOUNTER — Other Ambulatory Visit: Payer: Self-pay

## 2020-11-28 DIAGNOSIS — Z23 Encounter for immunization: Secondary | ICD-10-CM

## 2020-11-28 MED ORDER — DAPAGLIFLOZIN PROPANEDIOL 5 MG PO TABS: 5.0000 mg | ORAL_TABLET | Freq: Every day | ORAL | 0 refills | Status: DC

## 2020-12-13 ENCOUNTER — Other Ambulatory Visit: Payer: Self-pay

## 2020-12-13 ENCOUNTER — Ambulatory Visit
Admission: RE | Admit: 2020-12-13 | Discharge: 2020-12-13 | Disposition: A | Discharge status: Home / Self Care | Payer: Medicare HMO | Source: Ambulatory Visit | Attending: Medical | Admitting: Medical

## 2020-12-13 DIAGNOSIS — Z1231 Encounter for screening mammogram for malignant neoplasm of breast: Secondary | ICD-10-CM

## 2020-12-19 ENCOUNTER — Encounter: Payer: Self-pay | Admitting: Medical

## 2020-12-19 ENCOUNTER — Ambulatory Visit (INDEPENDENT_AMBULATORY_CARE_PROVIDER_SITE_OTHER): Payer: Medicare HMO | Admitting: Medical

## 2020-12-19 ENCOUNTER — Other Ambulatory Visit: Payer: Self-pay

## 2020-12-19 VITALS — BP 110/70 | HR 70 | Ht 63.0 in | Wt 210.8 lb

## 2020-12-19 DIAGNOSIS — I2510 Atherosclerotic heart disease of native coronary artery without angina pectoris: Secondary | ICD-10-CM

## 2020-12-19 DIAGNOSIS — E785 Hyperlipidemia, unspecified: Secondary | ICD-10-CM

## 2020-12-19 DIAGNOSIS — J4520 Mild intermittent asthma, uncomplicated: Secondary | ICD-10-CM | POA: Diagnosis not present

## 2020-12-19 DIAGNOSIS — L853 Xerosis cutis: Secondary | ICD-10-CM

## 2020-12-19 DIAGNOSIS — Z0000 Encounter for general adult medical examination without abnormal findings: Secondary | ICD-10-CM

## 2020-12-19 DIAGNOSIS — N952 Postmenopausal atrophic vaginitis: Secondary | ICD-10-CM

## 2020-12-19 DIAGNOSIS — Z8781 Personal history of (healed) traumatic fracture: Secondary | ICD-10-CM

## 2020-12-19 DIAGNOSIS — I10 Essential (primary) hypertension: Secondary | ICD-10-CM

## 2020-12-19 DIAGNOSIS — Z96652 Presence of left artificial knee joint: Secondary | ICD-10-CM

## 2020-12-19 DIAGNOSIS — Z79899 Other long term (current) drug therapy: Secondary | ICD-10-CM

## 2020-12-19 DIAGNOSIS — E559 Vitamin D deficiency, unspecified: Secondary | ICD-10-CM

## 2020-12-19 DIAGNOSIS — I700 Atherosclerosis of aorta: Secondary | ICD-10-CM

## 2020-12-19 DIAGNOSIS — E118 Type 2 diabetes mellitus with unspecified complications: Secondary | ICD-10-CM

## 2020-12-19 DIAGNOSIS — E2839 Other primary ovarian failure: Secondary | ICD-10-CM | POA: Diagnosis not present

## 2020-12-19 DIAGNOSIS — Z7185 Encounter for immunization safety counseling: Secondary | ICD-10-CM

## 2020-12-19 DIAGNOSIS — Z780 Asymptomatic menopausal state: Secondary | ICD-10-CM | POA: Diagnosis not present

## 2020-12-19 DIAGNOSIS — I5022 Chronic systolic (congestive) heart failure: Secondary | ICD-10-CM

## 2020-12-19 DIAGNOSIS — Z7189 Other specified counseling: Secondary | ICD-10-CM

## 2020-12-19 MED ORDER — DAPAGLIFLOZIN PROPANEDIOL 5 MG PO TABS: 5.0000 mg | ORAL_TABLET | Freq: Every day | ORAL | 0 refills | Status: DC

## 2020-12-19 NOTE — Progress Notes (Signed)
Subjective:    Darlene Wade is a 65 y.o. female who presents for Preventative Services visit and chronic medical problems/med check visit.    Primary Care Provider Carrie Schoonmaker, Camelia Eng, PA-C here for primary care  Medical Services you may have received from other than [facility] providers in the past year (date may be approximate) Cardiology- Dr. Acie Fredrickson Knee- [facility], [place] doctor, [facility]   Dr. Frederik Pear, ortho Dr. Mertie Moores and Richardson Dopp, PA-C, cardiology Dr. Wilfrid Lund, GI Dr. Donnie Mesa, general surgery   Exercise Current exercise habits: stationary bike and walking  Nutrition/Diet Current diet: tries to eat healhty most of the time, some soda and junk food  Depression Screen Depression screen [facility] 2/9 12/19/2020  Decreased Interest 0  Down, Depressed, Hopeless 0  PHQ - 2 Score 0  Altered sleeping -  Tired, decreased energy -  Change in appetite -  Feeling bad or failure about yourself  -  Trouble concentrating -  Moving slowly or fidgety/restless -  Suicidal thoughts -  PHQ-9 Score -    Activities of Daily Living Screen/Functional Status Survey Is the patient deaf or have difficulty hearing?: No Does the patient have difficulty seeing, even when wearing glasses/contacts?: No Does the patient have difficulty concentrating, remembering, or making decisions?: No Does the patient have difficulty walking or climbing stairs?: Yes (knee pain) Does the patient have difficulty dressing or bathing?: No Does the patient have difficulty doing errands alone such as visiting a doctor's office or shopping?: No  Can patient draw a clock face showing 3:15 oclock, yes   Fall Risk Screen Fall Risk  12/19/2020 06/13/2020 11/17/2019 11/16/2018 11/13/2017  Falls in the past year? 0 1 1 0 No  Number falls in past yr: 0 0 1 - -  Injury with Fall? 0 0 0 - -  Risk for fall due to : No Fall Risks No Fall Risks - - -  Follow up Falls evaluation completed -  - - -    Gait Assessment: Normal gait observed  - yes  Advanced directives Does patient have a [place]? no Does patient have a Living Will? no  Past Medical History:  Diagnosis Date   Asthma 07/20/2018   one puff per day   Chronic combined systolic and diastolic heart failure ([place]) 03/03/2018   Echo 07/2018: EF 40-45, diff HK worse in Inf base, normal RVSF   Diabetes mellitus without complication ([place]) 1610   Diabetic eye exam ([place])    [employer]   Dyspnea    Elbow fracture, right 2007   Former smoker    20 pack year history, quit 2010   Hyperlipidemia    Hypertension    Insomnia    Lung nodule    Chest CT 07/2018:  RLL nodule resolved.  3 mm subpleural LUL nodule.  Repeat in 1 year if high risk.    Obesity    PONV (postoperative nausea and vomiting) 2014   1 time   Wears glasses    reading    Past Surgical History:  Procedure Laterality Date   BREAST CYST ASPIRATION  2015   BREAST EXCISIONAL BIOPSY Left    BREAST LUMPECTOMY WITH NEEDLE LOCALIZATION Left 10/07/2012   Procedure: BREAST LUMPECTOMY WITH NEEDLE LOCALIZATION;  Surgeon: Imogene Burn. Georgette Dover, MD;  Location: [place];  Service: General;  Laterality: Left;   BREAST SURGERY     COLONOSCOPY  01/2016  01/2016 with Dr. Loletha Carrow, tubular adenoma polpy; 2003 with Dr. Collene Mares   ELBOW ARTHROPLASTY  2006   rt-fx   FOOT ARTHROTOMY  12/2010   foot fusion, right   FOOT MASS EXCISION  04/2012   left-fusion   HYSTERECTOMY ABDOMINAL WITH SALPINGECTOMY Bilateral 1985   PARTIAL HYSTERECTOMY  age 13   uterine fibroids, still has ovaries   TONSILLECTOMY     TOTAL KNEE ARTHROPLASTY Left 08/10/2018   Procedure: Left Knee Arthroplasty;  Surgeon: Frederik Pear, MD;  Location: [facility];  Service: Orthopedics;  Laterality: Left;    Social History   Socioeconomic History   Marital status: Married    Spouse name: Not on file   Number of children: Not on file   Years of education: Not on file    Highest education level: Not on file  Occupational History   Not on file  Tobacco Use   Smoking status: Former    Packs/day: 1.00    Years: 20.00    Pack years: 20.00    Types: Cigarettes    Quit date: 04/11/2009    Years since quitting: 11.6   Smokeless tobacco: Never  Vaping Use   Vaping Use: Never used  Substance and Sexual Activity   Alcohol use: Yes    Alcohol/week: 1.0 standard drink    Types: 1 Glasses of wine per week    Comment: rare   Drug use: No   Sexual activity: Not on file  Other Topics Concern   Not on file  Social History Narrative   Separated from husband (2013). Living with husband.  Walking for exercise 2-3 x per week.  Retired, Set designer in the past.  Has 2 grandchildren, teenagers.   Driving school bus.   12/2020   Social Determinants of Health   Financial Resource Strain: Not on file  Food Insecurity: Not on file  Transportation Needs: Not on file  Physical Activity: Not on file  Stress: Not on file  Social Connections: Not on file  Intimate Partner Violence: Not on file    Family History  Problem Relation Age of Onset   Diabetes Mother    Hypertension Mother    Diabetes Maternal Aunt    Heart disease Maternal Grandfather    Cancer Neg Hx    Breast cancer Neg Hx      Current Outpatient Medications:    ACCU-CHEK GUIDE test strip, TEST 1 TO 2 TIMES DAILY, Disp: 100 strip, Rfl: 2   Accu-Chek Softclix Lancets lancets, USE AS DIRECTED, Disp: 100 each, Rfl: 12   aspirin EC 81 MG tablet, Take 1 tablet (81 mg total) by mouth daily., Disp: 90 tablet, Rfl: 3   atorvastatin (LIPITOR) 40 MG tablet, TAKE 1 TABLET(40 MG) BY MOUTH DAILY, Disp: 90 tablet, Rfl: 3   carvedilol (COREG) 6.25 MG tablet, Take 1 tablet (6.25 mg total) by mouth 2 (two) times daily., Disp: 180 tablet, Rfl: 3   cholecalciferol (VITAMIN D3) 25 MCG (1000 UNIT) tablet, Take 2 tablets (2,000 Units total) by mouth daily., Disp: 180 tablet, Rfl: 3   JANUMET 50-500 MG tablet, TAKE 1  TABLET BY MOUTH TWICE DAILY WITH A MEAL, Disp: 180 tablet, Rfl: 1   losartan (COZAAR) 100 MG tablet, TAKE 1 TABLET(100 MG) BY MOUTH DAILY, Disp: 90 tablet, Rfl: 3   Multiple Vitamin (MULTIVITAMIN) tablet, Take 1 tablet by mouth daily., Disp: , Rfl:    Blood Glucose Monitoring Suppl (ACCU-CHEK AVIVA PLUS) w/Device KIT, Test blood sugar 2 times daily, Disp:  1 kit, Rfl: 0   celecoxib (CELEBREX) 200 MG capsule, , Disp: , Rfl:    dapagliflozin propanediol (FARXIGA) 5 MG TABS tablet, Take 1 tablet (5 mg total) by mouth daily before breakfast., Disp: 35 tablet, Rfl: 0   tiZANidine (ZANAFLEX) 2 MG tablet, Take 2 mg by mouth., Disp: , Rfl:   Allergies  Allergen Reactions   Jardiance [Empagliflozin]     Yeast infections, tolerates farxiga though   Naproxen Nausea Only    History reviewed: allergies, current medications, past family history, past medical history, past social history, past surgical history and problem list  Chronic issues discussed: Hyperlipidemia-compliant with medication without complaint  Diabetes-doing fine on current therapy.  She really enjoys playing every yeast infections.  So far tolerating [place] relatively okay.  Blood sugars lately have been running less than 130 fasting.  Compliant with other medicines as usual  History of heart disease, heart failure, coronary artery disease-sees cardiologist within the next 2 weeks.  Compliant with medications.  No recent chest pain, shortness of breath, edema or other symptoms.  She is status post knee surgery.  She continues to get some knee pains  Former smoker, stopped tobacco 13 years ago    Acute issues discussed: No new issues  Objective:     Biometrics BP 110/70   Pulse 70   Ht '5\' 3"'  (1.6 m)   Wt 210 lb 12.8 oz (95.6 kg)   BMI 37.34 kg/m   Wt Readings from Last 3 Encounters:  12/19/20 210 lb 12.8 oz (95.6 kg)  07/26/20 215 lb (97.5 kg)  06/13/20 218 lb 12.8 oz (99.2 kg)    General: Well-developed  well-nourished no acute distress Alert and oriented x3, good recall, mini cog testing normal HEENT: normocephalic, sclerae anicteric, Neck: supple, no lymphadenopathy, no thyromegaly, no masses, no bruits Heart: RRR, normal S1, S2, no murmurs Lungs: CTA bilaterally, no wheezes, rhonchi, or rales Abdomen: +bs, soft, non tender, non distended, no masses, no hepatomegaly, no splenomegaly Musculoskeletal: nontender, no swelling, limited exam Extremities: no edema, no cyanosis, no clubbing Pulses: 2+ symmetric, upper and lower extremities, normal cap refill Neurological: alert, oriented x 3, CN2-12 intact, strength normal upper extremities and lower extremities, sensation normal throughout, DTRs 2+ throughout, no cerebellar signs, gait normal Psychiatric: normal affect, behavior normal, pleasant  Breast, pelvic, rectal-declined  Diabetic Foot Exam - Simple   Simple Foot Form Diabetic Foot exam was performed with the following findings: Yes 12/19/2020  3:21 PM  Visual Inspection See comments: Yes Sensation Testing Intact to touch and monofilament testing bilaterally: Yes Pulse Check Posterior Tibialis and Dorsalis pulse intact bilaterally: Yes Comments Surgical scars bilateral great toes and lateral feet from prior surgery otherwise no lesions         Assessment:   Encounter Diagnoses  Name Primary?   Encounter for health maintenance examination in adult Yes   Estrogen deficiency    Vitamin D deficiency    Vaccine counseling    S/P total knee arthroplasty, left    Postmenopausal estrogen deficiency    Mild intermittent asthma without complication    Medicare annual wellness visit, subsequent    Hyperlipidemia, unspecified hyperlipidemia type    History of fracture    High risk medication use    Essential hypertension, benign    Diabetes mellitus with complication ([place])    Coronary artery disease involving native coronary artery of native heart without angina pectoris     Chronic systolic CHF (congestive heart failure) ([place])  Atrophic vaginitis    Aortic atherosclerosis ([place])    Advanced directives, counseling/discussion    Dry skin      Plan:   A preventative services visit was completed today.  During the course of the visit today, we discussed and counseled about appropriate screening and preventive services.  A health risk assessment was established today that included a review of current medications, allergies, social history, family history, medical and preventative health history, biometrics, and preventative screenings to identify potential safety concerns or impairments.  This visit was a preventative care visit, also known as wellness visit or routine physical.   Topics typically include healthy lifestyle, diet, exercise, preventative care, vaccinations, sick and well care, proper use of emergency dept and after hours care, as well as other concerns.     Recommendations: Continue to return yearly for your annual wellness and preventative care visits.  This gives [place] a chance to discuss healthy lifestyle, exercise, vaccinations, review your chart record, and perform screenings where appropriate.  I recommend you see your eye doctor yearly for routine vision care.  I recommend you see your dentist yearly for routine dental care including hygiene visits twice yearly.   Vaccination recommendations were reviewed Immunization History  Administered Date(s) Administered   Fluad Quad(high Dose 65+) 11/16/2018, 11/17/2019, 11/28/2020   Influenza,inj,Quad PF,6+ Mos 09/28/2013, 09/21/2014, 12/19/2015, 12/27/2016, 11/13/2017   PFIZER(Purple Top)SARS-COV-2 Vaccination 03/18/2019, 04/08/2019, 11/13/2019   Pfizer Covid-19 Vaccine Bivalent Booster 4yr & up 11/28/2020   Pneumococcal Conjugate-13 03/03/2018   Pneumococcal Polysaccharide-23 11/13/2011, 06/13/2020   Tdap 11/13/2011    Check insurance coverage for tetanus or shingles vaccines   Screening  for cancer: Colon cancer screening: I reviewed your colonoscopy on file that is up to date from 2017.  You are due repeat 2023   Breast cancer screening: You should perform a self breast exam monthly.   We reviewed recommendations for regular mammograms and breast cancer screening.  Cervical cancer screening: We reviewed recommendations for pap smear screening.  You have had hysterectomy   Skin cancer screening: Check your skin regularly for new changes, growing lesions, or other lesions of concern Come in for evaluation if you have skin lesions of concern.  Lung cancer screening: If you have a greater than 20 pack year history of tobacco use, then you may qualify for lung cancer screening with a chest CT scan.   Please call your insurance company to inquire about coverage for this test.  We currently don't have screenings for other cancers besides breast, cervical, colon, and lung cancers.  If you have a strong family history of cancer or have other cancer screening concerns, please let me know.    Bone health: Get at least 150 minutes of aerobic exercise weekly Get weight bearing exercise at least once weekly Bone density test:  A bone density test is an imaging test that uses a type of X-ray to measure the amount of calcium and other minerals in your bones. The test may be used to diagnose or screen you for a condition that causes weak or thin bones (osteoporosis), predict your risk for a broken bone (fracture), or determine how well your osteoporosis treatment is working. The bone density test is recommended for females 621and older, or females or males <<20if certain risk factors such as thyroid disease, long term use of steroids such as for asthma or rheumatological issues, vitamin D deficiency, estrogen deficiency, family history of osteoporosis, self or family history of fragility fracture in first  degree relative.    Heart health: Get at least 150 minutes of aerobic exercise  weekly Limit alcohol It is important to maintain a healthy blood pressure and healthy cholesterol numbers  Heart disease screening: Screening for heart disease includes screening for blood pressure, fasting lipids, glucose/diabetes screening, BMI height to weight ratio, reviewed of smoking status, physical activity, and diet.    Goals include blood pressure 120/80 or less, maintaining a healthy lipid/cholesterol profile, preventing diabetes or keeping diabetes numbers under good control, not smoking or using tobacco products, exercising most days per week or at least 150 minutes per week of exercise, and eating healthy variety of fruits and vegetables, healthy oils, and avoiding unhealthy food choices like fried food, fast food, high sugar and high cholesterol foods.    Follow-up with cardiology as planned soon    Medical care options: I recommend you continue to seek care here first for routine care.  We try really hard to have available appointments Monday through Friday daytime hours for sick visits, acute visits, and physicals.  Urgent care should be used for after hours and weekends for significant issues that cannot wait till the next day.  The emergency department should be used for significant potentially life-threatening emergencies.  The emergency department is expensive, can often have long wait times for less significant concerns, so try to utilize primary care, urgent care, or telemedicine when possible to avoid unnecessary trips to the emergency department.  Virtual visits and telemedicine have been introduced since the pandemic started in 2020, and can be convenient ways to receive medical care.  We offer virtual appointments as well to assist you in a variety of options to seek medical care.   Advanced Directives: I recommend you consider completing a [place] and Living Will.   These documents respect your wishes and help alleviate burdens on your loved ones if  you were to become terminally ill or be in a position to need those documents enforced.    You can complete Advanced Directives yourself, have them notarized, then have copies made for our office, for you and for anybody you feel should have them in safe keeping.  Or, you can have an attorney prepare these documents.   If you haven't updated your Last Will and Testament in a while, it may be worthwhile having an attorney prepare these documents together and save on some costs.       Specific issues: Diabetes-continue current medication, continue daily foot checks, continue yearly eye doctor visits and routine follow-up.  For diabetes check.  Continue glucose monitoring.  Discussed risk and benefits of medications.  Hyperlipidemia-continue current medication,  routine labs today  History of heart failure, coronary artery disease-continue current medications, follow-up with cardiology as planned soon.  Discussed need to do daily weights, limit salt  Dry skin -  cut out some of the alcohol-based and gels.  Continue to use daily moisturizing lotion    Zyara was seen today for fasting cpe .  Diagnoses and all orders for this visit:  Encounter for health maintenance examination in adult -     DG Bone Density; Future -     Comprehensive metabolic panel -     Hemoglobin A1c -     CBC -     Lipid panel -     Microalbumin/Creatinine Ratio, Urine  Estrogen deficiency  Vitamin D deficiency  Vaccine counseling  S/P total knee arthroplasty, left  Postmenopausal estrogen deficiency -  DG Bone Density; Future  Mild intermittent asthma without complication  Medicare annual wellness visit, subsequent  Hyperlipidemia, unspecified hyperlipidemia type -     Lipid panel  History of fracture -     DG Bone Density; Future  High risk medication use  Essential hypertension, benign  Diabetes mellitus with complication (HCC) -     Hemoglobin A1c -     Microalbumin/Creatinine Ratio,  Urine  Coronary artery disease involving native coronary artery of native heart without angina pectoris  Chronic systolic CHF (congestive heart failure) (HCC)  Atrophic vaginitis  Aortic atherosclerosis ([place])  Advanced directives, counseling/discussion  Dry skin  Other orders -     dapagliflozin propanediol (FARXIGA) 5 MG TABS tablet; Take 1 tablet (5 mg total) by mouth daily before breakfast.      Medicare Attestation A preventative services visit was completed today.  During the course of the visit the patient was educated and counseled about appropriate screening and preventive services.  A health risk assessment was established with the patient that included a review of current medications, allergies, social history, family history, medical and preventative health history, biometrics, and preventative screenings to identify potential safety concerns or impairments.  A personalized plan was printed today for the patient's records and use.   Personalized health advice and education was given today to reduce health risks and promote self management and wellness.  Information regarding end of life planning was discussed today.  Dorothea Ogle, PA-C   12/19/2020

## 2020-12-19 NOTE — Progress Notes (Deleted)
Subjective:    Darlene Wade is a 65 y.o. female who presents for Preventative Services visit and chronic medical problems/med check visit.    Primary Care Provider Tysinger, Camelia Eng, PA-C here for primary care  Medical Services you may have received from other than [facility] providers in the past year (date may be approximate) Cardiology- Dr. Acie Fredrickson Knee- [facility], [facility] doctor, [facility]   Dr. Frederik Pear, ortho Dr. Mertie Moores and Richardson Dopp, PA-C, cardiology Dr. Wilfrid Lund, GI Dr. Donnie Mesa, general surgery   Exercise Current exercise habits:  excerisa    Nutrition/Diet Current diet: not asked  Depression Screen Depression screen [facility] 2/9 12/19/2020  Decreased Interest 0  Down, Depressed, Hopeless 0  PHQ - 2 Score 0  Altered sleeping -  Tired, decreased energy -  Change in appetite -  Feeling bad or failure about yourself  -  Trouble concentrating -  Moving slowly or fidgety/restless -  Suicidal thoughts -  PHQ-9 Score -    Activities of Daily Living Screen/Functional Status Survey Is the patient deaf or have difficulty hearing?: No Does the patient have difficulty seeing, even when wearing glasses/contacts?: No Does the patient have difficulty concentrating, remembering, or making decisions?: No Does the patient have difficulty walking or climbing stairs?: Yes (knee pain) Does the patient have difficulty dressing or bathing?: No Does the patient have difficulty doing errands alone such as visiting a doctor's office or shopping?: No  Can patient draw a clock face showing 3:15 oclock, {YES NO:22349}  Fall Risk Screen Fall Risk  12/19/2020 06/13/2020 11/17/2019 11/16/2018 11/13/2017  Falls in the past year? 0 1 1 0 No  Number falls in past yr: 0 0 1 - -  Injury with Fall? 0 0 0 - -  Risk for fall due to : No Fall Risks No Fall Risks - - -  Follow up Falls evaluation completed - - - -    Gait Assessment: Normal gait observed  yes  Advanced directives Does patient have a [place]? No Does patient have a Living Will? No   Past Medical History:  Diagnosis Date   Asthma 07/20/2018   one puff per day   Chronic combined systolic and diastolic heart failure ([place]) 03/03/2018   Echo 07/2018: EF 40-45, diff HK worse in Inf base, normal RVSF   Diabetes mellitus without complication ([place]) 4315   Diabetic eye exam ([place])    [employer]   Dyspnea    Elbow fracture, right 2007   Former smoker    20 pack year history, quit 2010   Hyperlipidemia    Hypertension    Insomnia    Lung nodule    Chest CT 07/2018:  RLL nodule resolved.  3 mm subpleural LUL nodule.  Repeat in 1 year if high risk.    Obesity    PONV (postoperative nausea and vomiting) 2014   1 time   Wears glasses    reading    Past Surgical History:  Procedure Laterality Date   BREAST CYST ASPIRATION  2015   BREAST EXCISIONAL BIOPSY Left    BREAST LUMPECTOMY WITH NEEDLE LOCALIZATION Left 10/07/2012   Procedure: BREAST LUMPECTOMY WITH NEEDLE LOCALIZATION;  Surgeon: Imogene Burn. Georgette Dover, MD;  Location: [place];  Service: General;  Laterality: Left;   BREAST SURGERY     COLONOSCOPY  01/2016   01/2016 with Dr. Loletha Carrow, tubular adenoma polpy; 2003 with Dr. Collene Mares  ELBOW ARTHROPLASTY  2006   rt-fx   FOOT ARTHROTOMY  12/2010   foot fusion, right   FOOT MASS EXCISION  04/2012   left-fusion   HYSTERECTOMY ABDOMINAL WITH SALPINGECTOMY Bilateral 1985   PARTIAL HYSTERECTOMY  age 46   uterine fibroids, still has ovaries   TONSILLECTOMY     TOTAL KNEE ARTHROPLASTY Left 08/10/2018   Procedure: Left Knee Arthroplasty;  Surgeon: Frederik Pear, MD;  Location: [facility];  Service: Orthopedics;  Laterality: Left;    Social History   Socioeconomic History   Marital status: Married    Spouse name: Not on file   Number of children: Not on file   Years of education: Not on file   Highest education level: Not on file  Occupational  History   Not on file  Tobacco Use   Smoking status: Former    Packs/day: 1.00    Years: 20.00    Pack years: 20.00    Types: Cigarettes    Quit date: 04/11/2009    Years since quitting: 11.6   Smokeless tobacco: Never  Vaping Use   Vaping Use: Never used  Substance and Sexual Activity   Alcohol use: Yes    Alcohol/week: 1.0 standard drink    Types: 1 Glasses of wine per week    Comment: rare   Drug use: No   Sexual activity: Not on file  Other Topics Concern   Not on file  Social History Narrative   Separated from husband (2013). Living with husband.  Walking for exercise 2-3 x per week.  Retired, Set designer in the past.  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  Driving school bus.   11/2019   Social Determinants of Health   Financial Resource Strain: Not on file  Food Insecurity: Not on file  Transportation Needs: Not on file  Physical Activity: Not on file  Stress: Not on file  Social Connections: Not on file  Intimate Partner Violence: Not on file    Family History  Problem Relation Age of Onset   Diabetes Mother    Hypertension Mother    Diabetes Maternal Aunt    Heart disease Maternal Grandfather    Cancer Neg Hx    Breast cancer Neg Hx      Current Outpatient Medications:    ACCU-CHEK GUIDE test strip, TEST 1 TO 2 TIMES DAILY, Disp: 100 strip, Rfl: 2   Accu-Chek Softclix Lancets lancets, USE AS DIRECTED, Disp: 100 each, Rfl: 12   aspirin EC 81 MG tablet, Take 1 tablet (81 mg total) by mouth daily., Disp: 90 tablet, Rfl: 3   atorvastatin (LIPITOR) 40 MG tablet, TAKE 1 TABLET(40 MG) BY MOUTH DAILY, Disp: 90 tablet, Rfl: 3   carvedilol (COREG) 6.25 MG tablet, Take 1 tablet (6.25 mg total) by mouth 2 (two) times daily., Disp: 180 tablet, Rfl: 3   cholecalciferol (VITAMIN D3) 25 MCG (1000 UNIT) tablet, Take 2 tablets (2,000 Units total) by mouth daily., Disp: 180 tablet, Rfl: 3   dapagliflozin propanediol (FARXIGA) 5 MG TABS tablet, Take 1 tablet (5 mg total) by  mouth daily before breakfast., Disp: 28 tablet, Rfl: 0   JANUMET 50-500 MG tablet, TAKE 1 TABLET BY MOUTH TWICE DAILY WITH A MEAL, Disp: 180 tablet, Rfl: 1   losartan (COZAAR) 100 MG tablet, TAKE 1 TABLET(100 MG) BY MOUTH DAILY, Disp: 90 tablet, Rfl: 3   Multiple Vitamin (MULTIVITAMIN) tablet, Take 1 tablet by mouth daily., Disp: , Rfl:    Blood Glucose Monitoring Suppl (  ACCU-CHEK AVIVA PLUS) w/Device KIT, Test blood sugar 2 times daily, Disp: 1 kit, Rfl: 0   celecoxib (CELEBREX) 200 MG capsule, , Disp: , Rfl:    tiZANidine (ZANAFLEX) 2 MG tablet, Take 2 mg by mouth., Disp: , Rfl:   Allergies  Allergen Reactions   Naproxen Nausea Only    History reviewed: allergies, current medications, past family history, past medical history, past social history, past surgical history and problem list  Chronic issues discussed: ***  Acute issues discussed: ***  Objective:      Biometrics BP 110/70   Pulse 70   Ht $R'5\' 3"'PC$  (1.6 m)   Wt 210 lb 12.8 oz (95.6 kg)   BMI 37.34 kg/m   Cognitive Testing  Alert? {yes/no:20286}  Normal Appearance?{yes/no:20286}  Oriented to person? {yes/no:20286}  Place? {yes/no:20286}   Time? {yes/no:20286}  Recall of three objects?  {yes/no:20286}  Can perform simple calculations? {yes/no:20286}  Displays appropriate judgment?{yes/no:20286}  Can read the correct time from a watch face?{yes/no:20286}  General appearance: alert, no distress, WD/WN, *** female  Nutritional Status: Inadequate calore intake? {YES NO:22349} Loss of muscle mass? {YES NO:22349} Loss of fat beneath skin? {YES NO:22349} Localized or general edema? {YES NO:22349} Diminished functional status? {YES [id number]  Other pertinent exam: HEENT: normocephalic, sclerae anicteric, TMs pearly, nares patent, no discharge or erythema, pharynx normal Oral cavity: MMM, no lesions Neck: supple, no lymphadenopathy, no thyromegaly, no masses Heart: RRR, normal S1, S2, no murmurs Lungs: CTA  bilaterally, no wheezes, rhonchi, or rales Abdomen: +bs, soft, non tender, non distended, no masses, no hepatomegaly, no splenomegaly Musculoskeletal: nontender, no swelling, no obvious deformity Extremities: no edema, no cyanosis, no clubbing Pulses: 2+ symmetric, upper and lower extremities, normal cap refill Neurological: alert, oriented x 3, CN2-12 intact, strength normal upper extremities and lower extremities, sensation normal throughout, DTRs 2+ throughout, no cerebellar signs, gait normal Psychiatric: normal affect, behavior normal, pleasant    Assessment:  No diagnosis found.   Plan:   A preventative services visit was completed today.  During the course of the visit today, we discussed and counseled about appropriate screening and preventive services.  A health risk assessment was established today that included a review of current medications, allergies, social history, family history, medical and preventative health history, biometrics, and preventative screenings to identify potential safety concerns or impairments.  A personalized plan was printed today for your records and use.   Personalized health advice and education was given today to reduce health risks and promote self management and wellness.  Information regarding end of life planning was discussed today.  Conditions/risks identified: *** Fall risk? Exercise - *** Nutritions - *** ADL Depression Vision Hearing Gait   Chronic problems discussed today: ***  Acute problems discussed today: ***  Recommendations: I recommend a yearly ophthalmology/optometry visit for glaucoma screening and eye checkup I recommended a yearly dental visit for hygiene and checkup Advanced directives - discussed nature and purpose of Advanced Directives, encouraged them to complete them if they have not done so and/or encouraged them to get [place] a copy if they have done this already. *** I recommend a screening mammogram every 1-2  years Your last colonoscopy was ***.  I recommend you have a *** Your last bone density screen was ***.  I recommend you have a repeat bone density screen ***. Your last diabetes screen was ***.    Your last lipid/cholesterol screen was ***.   Referrals today: ***  Immunizations: I recommended a yearly influenza vaccine,  typically in September when the vaccine is usually available Is the Pneumococcal vaccine up to date: {YES NO:22349}. Is the Shingles vaccine up to date: {YES NO:22349}.   Is the Td/Tdap vaccine up to date: {YES NO:22349}.   Medicare Attestation A preventative services visit was completed today.  During the course of the visit the patient was educated and counseled about appropriate screening and preventive services.  A health risk assessment was established with the patient that included a review of current medications, allergies, social history, family history, medical and preventative health history, biometrics, and preventative screenings to identify potential safety concerns or impairments.  A personalized plan was printed today for the patient's records and use.   Personalized health advice and education was given today to reduce health risks and promote self management and wellness.  Information regarding end of life planning was discussed today.  Dorothea Ogle, PA-C   12/19/2020     ***DELETE Other referrals - social services, PT, meals, transportation, nutrition therapy, weight loss, exercise, falls, tobacco   HIV screen AAA screen EKG HgbA1C diabetes screen Lipid screen CBC-anemia screen Incontinence?   CPT [phone] first AWV CPT [phone] subsequent AWV EKG Pelvic exam

## 2020-12-19 NOTE — Patient Instructions (Signed)
This visit was a preventative care visit, also known as wellness visit or routine physical.   Topics typically include healthy lifestyle, diet, exercise, preventative care, vaccinations, sick and well care, proper use of emergency dept and after hours care, as well as other concerns.     Recommendations: Continue to return yearly for your annual wellness and preventative care visits.  This gives [place] a chance to discuss healthy lifestyle, exercise, vaccinations, review your chart record, and perform screenings where appropriate.  I recommend you see your eye doctor yearly for routine vision care.  I recommend you see your dentist yearly for routine dental care including hygiene visits twice yearly.   Vaccination recommendations were reviewed Immunization History  Administered Date(s) Administered   Fluad Quad(high Dose 65+) 11/16/2018, 11/17/2019, 11/28/2020   Influenza,inj,Quad PF,6+ Mos 09/28/2013, 09/21/2014, 12/19/2015, 12/27/2016, 11/13/2017   PFIZER(Purple Top)SARS-COV-2 Vaccination 03/18/2019, 04/08/2019, 11/13/2019   Pfizer Covid-19 Vaccine Bivalent Booster 77yrs & up 11/28/2020   Pneumococcal Conjugate-13 03/03/2018   Pneumococcal Polysaccharide-23 11/13/2011, 06/13/2020   Tdap 11/13/2011    Check insurance coverage for tetanus or shingles vaccines   Screening for cancer: Colon cancer screening: I reviewed your colonoscopy on file that is up to date from 2017.  You are due repeat 2023   Breast cancer screening: You should perform a self breast exam monthly.   We reviewed recommendations for regular mammograms and breast cancer screening.  Cervical cancer screening: We reviewed recommendations for pap smear screening.  You have had hysterectomy   Skin cancer screening: Check your skin regularly for new changes, growing lesions, or other lesions of concern Come in for evaluation if you have skin lesions of concern.  Lung cancer screening: If you have a greater than  20 pack year history of tobacco use, then you may qualify for lung cancer screening with a chest CT scan.   Please call your insurance company to inquire about coverage for this test.  We currently don't have screenings for other cancers besides breast, cervical, colon, and lung cancers.  If you have a strong family history of cancer or have other cancer screening concerns, please let me know.    Bone health: Get at least 150 minutes of aerobic exercise weekly Get weight bearing exercise at least once weekly Bone density test:  A bone density test is an imaging test that uses a type of X-ray to measure the amount of calcium and other minerals in your bones. The test may be used to diagnose or screen you for a condition that causes weak or thin bones (osteoporosis), predict your risk for a broken bone (fracture), or determine how well your osteoporosis treatment is working. The bone density test is recommended for females 78 and older, or females or males <15 if certain risk factors such as thyroid disease, long term use of steroids such as for asthma or rheumatological issues, vitamin D deficiency, estrogen deficiency, family history of osteoporosis, self or family history of fragility fracture in first degree relative.    Heart health: Get at least 150 minutes of aerobic exercise weekly Limit alcohol It is important to maintain a healthy blood pressure and healthy cholesterol numbers  Heart disease screening: Screening for heart disease includes screening for blood pressure, fasting lipids, glucose/diabetes screening, BMI height to weight ratio, reviewed of smoking status, physical activity, and diet.    Goals include blood pressure 120/80 or less, maintaining a healthy lipid/cholesterol profile, preventing diabetes or keeping diabetes numbers under good control, not smoking or using  tobacco products, exercising most days per week or at least 150 minutes per week of exercise, and eating  healthy variety of fruits and vegetables, healthy oils, and avoiding unhealthy food choices like fried food, fast food, high sugar and high cholesterol foods.    Follow-up with cardiology as planned soon    Medical care options: I recommend you continue to seek care here first for routine care.  We try really hard to have available appointments Monday through Friday daytime hours for sick visits, acute visits, and physicals.  Urgent care should be used for after hours and weekends for significant issues that cannot wait till the next day.  The emergency department should be used for significant potentially life-threatening emergencies.  The emergency department is expensive, can often have long wait times for less significant concerns, so try to utilize primary care, urgent care, or telemedicine when possible to avoid unnecessary trips to the emergency department.  Virtual visits and telemedicine have been introduced since the pandemic started in 2020, and can be convenient ways to receive medical care.  We offer virtual appointments as well to assist you in a variety of options to seek medical care.   Advanced Directives: I recommend you consider completing a [place] and Living Will.   These documents respect your wishes and help alleviate burdens on your loved ones if you were to become terminally ill or be in a position to need those documents enforced.    You can complete Advanced Directives yourself, have them notarized, then have copies made for our office, for you and for anybody you feel should have them in safe keeping.  Or, you can have an attorney prepare these documents.   If you haven't updated your Last Will and Testament in a while, it may be worthwhile having an attorney prepare these documents together and save on some costs.       Specific issues: Diabetes-continue current medication, continue daily foot checks, continue yearly eye doctor visits and routine  follow-up.  For diabetes check.  Continue glucose monitoring.  Discussed risk and benefits of medications.  Hyperlipidemia-continue current medication,  routine labs today  History of heart failure, coronary artery disease-continue current medications, follow-up with cardiology as planned soon.  Discussed need to do daily weights, limit salt  Dry skin -  cut out some of the alcohol-based and gels.  Continue to use daily moisturizing lotion

## 2020-12-20 ENCOUNTER — Other Ambulatory Visit: Payer: Self-pay | Admitting: Medical

## 2020-12-20 LAB — CBC
Hematocrit: 37.8 % (ref 34.0–46.6)
Hemoglobin: 12 g/dL (ref 11.1–15.9)
MCH: 27.5 pg (ref 26.6–33.0)
MCHC: 31.7 g/dL (ref 31.5–35.7)
MCV: 87 fL (ref 79–97)
Platelets: 267 10*3/uL (ref 150–450)
RBC: 4.36 x10E6/uL (ref 3.77–5.28)
RDW: 14.5 % (ref 11.7–15.4)
WBC: 9.3 10*3/uL (ref 3.4–10.8)

## 2020-12-20 LAB — LIPID PANEL
Chol/HDL Ratio: 2.3 ratio (ref 0.0–4.4)
Cholesterol, Total: 144 mg/dL (ref 100–199)
HDL: 63 mg/dL (ref >39)
LDL Chol Calc (NIH): 67 mg/dL (ref 0–99)
Triglycerides: 69 mg/dL (ref 0–149)
VLDL Cholesterol Cal: 14 mg/dL (ref 5–40)

## 2020-12-20 LAB — COMPREHENSIVE METABOLIC PANEL
ALT: 11 IU/L (ref 0–32)
AST: 12 IU/L (ref 0–40)
Albumin/Globulin Ratio: 1.2 (ref 1.2–2.2)
Albumin: 4.2 g/dL (ref 3.8–4.8)
Alkaline Phosphatase: 105 IU/L (ref 44–121)
BUN/Creatinine Ratio: 17 (ref 12–28)
BUN: 15 mg/dL (ref 8–27)
Bilirubin Total: 0.4 mg/dL (ref 0.0–1.2)
CO2: 25 mmol/L (ref 20–29)
Calcium: 9.8 mg/dL (ref 8.7–10.3)
Chloride: 105 mmol/L (ref 96–106)
Creatinine, Ser: 0.86 mg/dL (ref 0.57–1.00)
Globulin, Total: 3.4 g/dL (ref 1.5–4.5)
Glucose: 93 mg/dL (ref 70–99)
Potassium: 4.2 mmol/L (ref 3.5–5.2)
Sodium: 144 mmol/L (ref 134–144)
Total Protein: 7.6 g/dL (ref 6.0–8.5)
eGFR: 74 mL/min/{1.73_m2} (ref >59)

## 2020-12-20 LAB — MICROALBUMIN / CREATININE URINE RATIO
Creatinine, Urine: 160.9 mg/dL
Microalb/Creat Ratio: 5 mg/g creat (ref 0–29)
Microalbumin, Urine: 7.3 ug/mL

## 2020-12-20 LAB — HEMOGLOBIN A1C
Est. average glucose Bld gHb Est-mCnc: 143 mg/dL
Hgb A1c MFr Bld: 6.6 % — ABNORMAL HIGH (ref 4.8–5.6)

## 2020-12-20 MED ORDER — JANUMET 50-500 MG PO TABS: 1.0000 | ORAL_TABLET | Freq: Two times a day (BID) | ORAL | 3 refills | Status: DC

## 2020-12-25 ENCOUNTER — Encounter: Payer: Self-pay | Admitting: Cardiovascular Disease

## 2020-12-25 ENCOUNTER — Telehealth: Payer: Self-pay

## 2020-12-25 ENCOUNTER — Other Ambulatory Visit: Payer: Self-pay

## 2020-12-25 MED ORDER — ATORVASTATIN CALCIUM 40 MG PO TABS: ORAL_TABLET | ORAL | 3 refills | Status: DC

## 2020-12-25 MED ORDER — LOSARTAN POTASSIUM 100 MG PO TABS: ORAL_TABLET | ORAL | 3 refills | Status: DC

## 2020-12-25 MED ORDER — TRUE METRIX BLOOD GLUCOSE TEST VI STRP: ORAL_STRIP | 12 refills | Status: DC

## 2020-12-25 MED ORDER — TRUE METRIX METER DEVI: 1 refills | Status: DC

## 2020-12-25 MED ORDER — TRUEPLUS LANCETS 28G MISC
1 refills | Status: DC
Start: 2020-12-25 — End: 2021-03-09

## 2020-12-25 NOTE — Telephone Encounter (Signed)
done

## 2020-12-25 NOTE — Telephone Encounter (Signed)
Received fax from [facility] for a refill on the pts. True metrix blood glucose mtr, [employer] true test strips, true plus super thin 28g lancet, and true metrix level 1 ctrl soln.

## 2020-12-25 NOTE — Progress Notes (Signed)
Cardiology Office Note:    Date:  12/26/2020   ID:  AMONIE WISSER, DOB 03/13/1955, MRN [id number]  PCP:  Carlena Hurl, PA-C  Cardiologist:  New to [place]  Electrophysiologist:  None   Referring MD: Carlena Hurl, PA-C   Problem List 1. Diabetes 2. HTN 3. Hyperlipidemia  4.  Osteoarthritis   Chief Complaint  Patient presents with   Congestive Heart Failure          Dec. 2, 2019:    Darlene Wade is a 65 y.o. female with a hx of hypertension, hyperlipidemia, Diabetes  Her primary ordered on echo  She was found to have a very mildly reduced left ventricular systolic function ejection fraction of 40 to 45%.  She has grade 1 diastolic dysfunction.  Was exercising regularly in the gym but her knees started causing her lots of pain  Now she is walking 30 minutes a day  Drives a school bus   She does not eat  Lots of salt Has lost 40 lbs over the past several months - has been paying attention to her diet  Is also on Invokana   Nov. 23, 2021:  Darlene Wade is seen today for follow-up visit.  She has chronic combined systolic and diastolic congestive heart failure. Last echocardiogram was June, 2020.  She has a left ventricular systolic function with an EF of 40 to 45%.  At that time she had normal diastolic parameters. Has not gained any weight .   Trying to watch her diet .  Wt today is 217 lbs ( down 3 lbs since Dec. 2019. She has had knee surgery since then.   Thinking about getting the other knee done   Lipids are managed by her primary md, labs were reviewed ,  Look great  December 26, 2020: Darlene Wade seen today for follow-up of her chronic combined systolic and diastolic congestive heart failure.  Last echocardiogram was performed in June, 2020.  Her ejection fraction is 40 to 45%. Had n Right TKA in June, 2022  Is back exercising ,  no CP or dyspnea Is still rehabbing her knee.   She is in the donut hole currently.  We would like to start her on Entresto but  this may be cost prohibitive for now.  The plan is for her to start Entresto 49-51 p.o. twice daily starting on January 1.  She will stop the losartan at that time.  We will check a basic metabolic profile approximately 2 to 3 weeks after she starts the [place]. Anticipate that she will be able to take the Entresto until about October or November of next year at which time she will change back to losartan 100 mg a day.  I will see her in 6 months.  Anticipate getting an echocardiogram if she has been able to tolerate the Entresto.  Hopefully we will see some further improvement of her left ventricular function.  Past Medical History:  Diagnosis Date   Asthma 07/20/2018   one puff per day   Chronic combined systolic and diastolic heart failure ([place]) 03/03/2018   Echo 07/2018: EF 40-45, diff HK worse in Inf base, normal RVSF   Diabetes mellitus without complication ([place]) 3299   Diabetic eye exam ([place])    [facility]   Dyspnea    Elbow fracture, right 2007   Former smoker    20 pack year history, quit 2010   Hyperlipidemia    Hypertension    Insomnia  Lung nodule    Chest CT 07/2018:  RLL nodule resolved.  3 mm subpleural LUL nodule.  Repeat in 1 year if high risk.    Obesity    PONV (postoperative nausea and vomiting) 2014   1 time   Wears glasses    reading    Past Surgical History:  Procedure Laterality Date   BREAST CYST ASPIRATION  2015   BREAST EXCISIONAL BIOPSY Left    BREAST LUMPECTOMY WITH NEEDLE LOCALIZATION Left 10/07/2012   Procedure: BREAST LUMPECTOMY WITH NEEDLE LOCALIZATION;  Surgeon: Imogene Burn. Georgette Dover, MD;  Location: [facility];  Service: General;  Laterality: Left;   BREAST SURGERY     COLONOSCOPY  01/2016   01/2016 with Dr. Loletha Carrow, tubular adenoma polpy; 2003 with Dr. Collene Mares   ELBOW ARTHROPLASTY  2006   rt-fx   FOOT ARTHROTOMY  12/2010   foot fusion, right   FOOT MASS EXCISION  04/2012   left-fusion   HYSTERECTOMY ABDOMINAL WITH SALPINGECTOMY  Bilateral 1985   PARTIAL HYSTERECTOMY  age 71   uterine fibroids, still has ovaries   TONSILLECTOMY     TOTAL KNEE ARTHROPLASTY Left 08/10/2018   Procedure: Left Knee Arthroplasty;  Surgeon: Frederik Pear, MD;  Location: [facility];  Service: Orthopedics;  Laterality: Left;    Current Medications: Current Meds  Medication Sig   ACCU-CHEK GUIDE test strip TEST 1 TO 2 TIMES DAILY   aspirin EC 81 MG tablet Take 1 tablet (81 mg total) by mouth daily.   atorvastatin (LIPITOR) 40 MG tablet TAKE 1 TABLET(40 MG) BY MOUTH DAILY   Blood Glucose Monitoring Suppl (ACCU-CHEK AVIVA PLUS) w/Device KIT Test blood sugar 2 times daily   Blood Glucose Monitoring Suppl (TRUE METRIX METER) DEVI Test 1-2 times a day   celecoxib (CELEBREX) 200 MG capsule    cholecalciferol (VITAMIN D3) 25 MCG (1000 UNIT) tablet Take 2 tablets (2,000 Units total) by mouth daily.   dapagliflozin propanediol (FARXIGA) 5 MG TABS tablet Take 1 tablet (5 mg total) by mouth daily before breakfast.   glucose blood (TRUE METRIX BLOOD GLUCOSE TEST) test strip Test 1-2 times a day   Multiple Vitamin (MULTIVITAMIN) tablet Take 1 tablet by mouth daily.   sacubitril-valsartan (ENTRESTO) 49-51 MG Take 1 tablet by mouth 2 (two) times daily.   sitaGLIPtin-metformin (JANUMET) 50-500 MG tablet Take 1 tablet by mouth 2 (two) times daily with a meal.   tiZANidine (ZANAFLEX) 2 MG tablet Take 2 mg by mouth.   TRUEplus Lancets 28G MISC Test 1-2 times a day   [DISCONTINUED] carvedilol (COREG) 6.25 MG tablet Take 1 tablet (6.25 mg total) by mouth 2 (two) times daily.   [DISCONTINUED] losartan (COZAAR) 100 MG tablet TAKE 1 TABLET(100 MG) BY MOUTH DAILY     Allergies:   Jardiance [empagliflozin] and Naproxen   Social History   Socioeconomic History   Marital status: Married    Spouse name: Not on file   Number of children: Not on file   Years of education: Not on file   Highest education level: Not on file  Occupational History   Not on file   Tobacco Use   Smoking status: Former    Packs/day: 1.00    Years: 20.00    Pack years: 20.00    Types: Cigarettes    Quit date: 04/11/2009    Years since quitting: 11.7   Smokeless tobacco: Never  Vaping Use   Vaping Use: Never used  Substance and Sexual Activity   Alcohol  use: Yes    Alcohol/week: 1.0 standard drink    Types: 1 Glasses of wine per week    Comment: rare   Drug use: No   Sexual activity: Not on file  Other Topics Concern   Not on file  Social History Narrative   Separated from husband (2013). Living with husband.  Walking for exercise 2-3 x per week.  Retired, Set designer in the past.  Has 2 grandchildren, teenagers.   Driving school bus.   12/2020   Social Determinants of Health   Financial Resource Strain: Not on file  Food Insecurity: Not on file  Transportation Needs: Not on file  Physical Activity: Not on file  Stress: Not on file  Social Connections: Not on file     Family History: The patient's family history includes Diabetes in her maternal aunt and mother; Heart disease in her maternal grandfather; Hypertension in her mother. There is no history of Cancer or Breast cancer.  ROS:   Please see the history of present illness.     All other systems reviewed and are negative.  EKGs/Labs/Other Studies Reviewed:    The following studies were reviewed today:   EKG:    Recent Labs: 12/19/2020: ALT 11; BUN 15; Creatinine, Ser 0.86; Hemoglobin 12.0; Platelets 267; Potassium 4.2; Sodium 144  Recent Lipid Panel    Component Value Date/Time   CHOL 144 12/19/2020 1527   TRIG 69 12/19/2020 1527   HDL 63 12/19/2020 1527   CHOLHDL 2.3 12/19/2020 1527   CHOLHDL 2.0 05/22/2016 1014   VLDL 10 05/22/2016 1014   LDLCALC 67 12/19/2020 1527    Physical Exam:     Physical Exam: Blood pressure 130/70, pulse 80, height '5\' 3"'  (1.6 m), weight 215 lb (97.5 kg), SpO2 92 %.  GEN:  moderately obese female,   NAD  HEENT: Normal NECK: No JVD; No carotid  bruits LYMPHATICS: No lymphadenopathy CARDIAC: RRR , no murmurs, rubs, gallops RESPIRATORY:  Clear to auscultation without rales, wheezing or rhonchi  ABDOMEN: Soft, non-tender, non-distended MUSCULOSKELETAL:  No edema; No deformity  SKIN: Warm and dry NEUROLOGIC:  Alert and oriented x 3   ASSESSMENT:    1. Chronic combined systolic and diastolic heart failure (Darlene Wade)     PLAN:       1.  Mild chronic ystolic  congestive heart failure:       She is in the donut hole currently.  We would like to start her on Entresto but this may be cost prohibitive for now.  The plan is for her to start Entresto 49-51 p.o. twice daily starting on January 1.  She will stop the losartan at that time.  We will check a basic metabolic profile approximately 2 to 3 weeks after she starts the [place]. Anticipate that she will be able to take the Entresto until about October or November of next year at which time she will change back to losartan 100 mg a day.  I will see her in 6 months.  Anticipate getting an echocardiogram if she has been able to tolerate the Entresto.  Hopefully we will see some further improvement of her left ventricular function.   2.  Hyperlipidemia:       Medication Adjustments/Labs and Tests Ordered: Current medicines are reviewed at length with the patient today.  Concerns regarding medicines are outlined above.  Orders Placed This Encounter  Procedures   Basic Metabolic Panel (BMET)    Meds ordered this encounter  Medications  sacubitril-valsartan (ENTRESTO) 49-51 MG    Sig: Take 1 tablet by mouth 2 (two) times daily.    Dispense:  60 tablet    Refill:  11    Please Honor Card patient is presenting for Carmie Kanner: [id number]; Juanna Cao: [id number]; IRJJO: OHS; ACZY: [id number]   carvedilol (COREG) 6.25 MG tablet    Sig: Take 1 tablet (6.25 mg total) by mouth 2 (two) times daily.    Dispense:  180 tablet    Refill:  3      Patient Instructions  Medication  Instructions:  Your physician has recommended you make the following change in your medication:  1-STOP Losartan at the end of November and start Entresto in December 2-START Entresto 49/51 mg by mouth twice daily  *If you need a refill on your cardiac medications before your next appointment, please call your pharmacy*  Lab Work: Your physician recommends that you have lab work around December 15th for a BMET.  If you have labs (blood work) drawn today and your tests are completely normal, you will receive your results only by: [place] (if you have MyChart) OR A paper copy in the mail If you have any lab test that is abnormal or we need to change your treatment, we will call you to review the results.   Follow-Up: At [facility], you and your health needs are our priority.  As part of our continuing mission to provide you with exceptional heart care, we have created designated Provider Care Teams.  These Care Teams include your primary Cardiologist (physician) and Advanced Practice Providers (APPs -  Physician Assistants and Nurse Practitioners) who all work together to provide you with the care you need, when you need it.  We recommend signing up for the patient portal called "MyChart".  Sign up information is provided on this After Visit Summary.  MyChart is used to connect with patients for Virtual Visits (Telemedicine).  Patients are able to view lab/test results, encounter notes, upcoming appointments, etc.  Non-urgent messages can be sent to your provider as well.   To learn more about what you can do with MyChart, go to [email or web address].    Your next appointment:   6 month(s)  The format for your next appointment:   In Person  Provider:   Mertie Moores, MD {   Signed, Mertie Moores, MD  12/26/2020 5:54 PM    [place]

## 2020-12-26 ENCOUNTER — Ambulatory Visit: Payer: Medicare HMO | Admitting: Cardiovascular Disease

## 2020-12-26 ENCOUNTER — Other Ambulatory Visit: Payer: Self-pay

## 2020-12-26 ENCOUNTER — Encounter: Payer: Self-pay | Admitting: Cardiovascular Disease

## 2020-12-26 VITALS — BP 130/70 | HR 80 | Ht 63.0 in | Wt 215.0 lb

## 2020-12-26 DIAGNOSIS — I5042 Chronic combined systolic (congestive) and diastolic (congestive) heart failure: Secondary | ICD-10-CM

## 2020-12-26 MED ORDER — CARVEDILOL 6.25 MG PO TABS: 6.2500 mg | ORAL_TABLET | Freq: Two times a day (BID) | ORAL | 3 refills | Status: DC

## 2020-12-26 MED ORDER — SACUBITRIL-VALSARTAN 49-51 MG PO TABS: 1.0000 | ORAL_TABLET | Freq: Two times a day (BID) | ORAL | 11 refills | Status: DC

## 2020-12-26 NOTE — Patient Instructions (Addendum)
Medication Instructions:  Your physician has recommended you make the following change in your medication:  1-STOP Losartan at the end of November and start Entresto in December 2-START Entresto 49/51 mg by mouth twice daily  *If you need a refill on your cardiac medications before your next appointment, please call your pharmacy*  Lab Work: Your physician recommends that you have lab work around December 15th for a BMET.  If you have labs (blood work) drawn today and your tests are completely normal, you will receive your results only by: [place] (if you have MyChart) OR A paper copy in the mail If you have any lab test that is abnormal or we need to change your treatment, we will call you to review the results.   Follow-Up: At [facility], you and your health needs are our priority.  As part of our continuing mission to provide you with exceptional heart care, we have created designated Provider Care Teams.  These Care Teams include your primary Cardiologist (physician) and Advanced Practice Providers (APPs -  Physician Assistants and Nurse Practitioners) who all work together to provide you with the care you need, when you need it.  We recommend signing up for the patient portal called "MyChart".  Sign up information is provided on this After Visit Summary.  MyChart is used to connect with patients for Virtual Visits (Telemedicine).  Patients are able to view lab/test results, encounter notes, upcoming appointments, etc.  Non-urgent messages can be sent to your provider as well.   To learn more about what you can do with MyChart, go to [email or web address].    Your next appointment:   6 month(s)  The format for your next appointment:   In Person  Provider:   Mertie Moores, MD {

## 2021-01-01 ENCOUNTER — Other Ambulatory Visit: Payer: Self-pay

## 2021-01-01 MED ORDER — ASPIRIN EC 81 MG PO TBEC: 81.0000 mg | DELAYED_RELEASE_TABLET | Freq: Every day | ORAL | 3 refills | Status: DC

## 2021-01-15 ENCOUNTER — Other Ambulatory Visit: Payer: Self-pay | Admitting: Medical

## 2021-01-15 DIAGNOSIS — Z8781 Personal history of (healed) traumatic fracture: Secondary | ICD-10-CM

## 2021-01-15 DIAGNOSIS — Z0000 Encounter for general adult medical examination without abnormal findings: Secondary | ICD-10-CM

## 2021-01-15 DIAGNOSIS — Z780 Asymptomatic menopausal state: Secondary | ICD-10-CM

## 2021-01-25 ENCOUNTER — Other Ambulatory Visit: Payer: Medicare HMO

## 2021-02-14 ENCOUNTER — Other Ambulatory Visit: Payer: Self-pay | Admitting: Medical

## 2021-02-14 MED ORDER — DAPAGLIFLOZIN PROPANEDIOL 5 MG PO TABS: 5.0000 mg | ORAL_TABLET | Freq: Every day | ORAL | 1 refills | Status: DC

## 2021-02-14 NOTE — Telephone Encounter (Signed)
You provided samples per rx last time- prescription for review pended

## 2021-02-14 NOTE — Telephone Encounter (Signed)
Pt called and is requesting a refill on her farxiga please send to [facility], [place]

## 2021-02-26 ENCOUNTER — Other Ambulatory Visit: Payer: Medicare HMO

## 2021-03-01 DIAGNOSIS — M25561 Pain in right knee: Secondary | ICD-10-CM | POA: Diagnosis not present

## 2021-03-01 DIAGNOSIS — M19011 Primary osteoarthritis, right shoulder: Secondary | ICD-10-CM | POA: Diagnosis not present

## 2021-03-05 ENCOUNTER — Other Ambulatory Visit: Payer: Medicare HMO

## 2021-03-05 ENCOUNTER — Other Ambulatory Visit: Payer: Self-pay

## 2021-03-05 DIAGNOSIS — I5042 Chronic combined systolic (congestive) and diastolic (congestive) heart failure: Secondary | ICD-10-CM

## 2021-03-05 LAB — BASIC METABOLIC PANEL
BUN/Creatinine Ratio: 22 (ref 12–28)
BUN: 15 mg/dL (ref 8–27)
CO2: 22 mmol/L (ref 20–29)
Calcium: 9.4 mg/dL (ref 8.7–10.3)
Chloride: 106 mmol/L (ref 96–106)
Creatinine, Ser: 0.68 mg/dL (ref 0.57–1.00)
Glucose: 109 mg/dL — ABNORMAL HIGH (ref 70–99)
Potassium: 4 mmol/L (ref 3.5–5.2)
Sodium: 140 mmol/L (ref 134–144)
eGFR: 95 mL/min/{1.73_m2} (ref >59)

## 2021-03-09 ENCOUNTER — Other Ambulatory Visit: Payer: Self-pay | Admitting: Medical

## 2021-03-17 DIAGNOSIS — H524 Presbyopia: Secondary | ICD-10-CM | POA: Diagnosis not present

## 2021-03-17 DIAGNOSIS — H52209 Unspecified astigmatism, unspecified eye: Secondary | ICD-10-CM | POA: Diagnosis not present

## 2021-03-17 DIAGNOSIS — H5203 Hypermetropia, bilateral: Secondary | ICD-10-CM | POA: Diagnosis not present

## 2021-04-24 ENCOUNTER — Other Ambulatory Visit: Payer: Self-pay

## 2021-04-24 ENCOUNTER — Ambulatory Visit (INDEPENDENT_AMBULATORY_CARE_PROVIDER_SITE_OTHER): Payer: Medicare HMO | Admitting: Medical

## 2021-04-24 VITALS — BP 124/84 | HR 100 | Wt 214.8 lb

## 2021-04-24 DIAGNOSIS — R0989 Other specified symptoms and signs involving the circulatory and respiratory systems: Secondary | ICD-10-CM

## 2021-04-24 DIAGNOSIS — E118 Type 2 diabetes mellitus with unspecified complications: Secondary | ICD-10-CM | POA: Diagnosis not present

## 2021-04-24 DIAGNOSIS — E785 Hyperlipidemia, unspecified: Secondary | ICD-10-CM

## 2021-04-24 DIAGNOSIS — I700 Atherosclerosis of aorta: Secondary | ICD-10-CM

## 2021-04-24 DIAGNOSIS — M25511 Pain in right shoulder: Secondary | ICD-10-CM

## 2021-04-24 DIAGNOSIS — I10 Essential (primary) hypertension: Secondary | ICD-10-CM | POA: Diagnosis not present

## 2021-04-24 DIAGNOSIS — E1169 Type 2 diabetes mellitus with other specified complication: Secondary | ICD-10-CM | POA: Diagnosis not present

## 2021-04-24 DIAGNOSIS — G8929 Other chronic pain: Secondary | ICD-10-CM

## 2021-04-24 DIAGNOSIS — E2839 Other primary ovarian failure: Secondary | ICD-10-CM | POA: Diagnosis not present

## 2021-04-24 DIAGNOSIS — Z7185 Encounter for immunization safety counseling: Secondary | ICD-10-CM

## 2021-04-24 LAB — POCT GLYCOSYLATED HEMOGLOBIN (HGB A1C): Hemoglobin A1C: 6.5 % — AB (ref 4.0–5.6)

## 2021-04-24 IMAGING — CT CT CHEST WITHOUT CONTRAST
2 of 3 series · 15 of 36 positions shown, 18 images · non-contrast
Comparison: CT coronary angiogram, 03/05/2018

CLINICAL DATA: Follow-up right lower lobe lung nodule on cardiac CT
03/05/2018

EXAM:
CT CHEST WITHOUT CONTRAST
TECHNIQUE: Multidetector CT imaging of the chest was performed following the
standard protocol without IV contrast.

[Series 2: thorax · axial · 0.67mm/px · z∈[-290,-38]mm · 12 of 148 slices shown, 15 images]
[im 11/148  mediastinal]
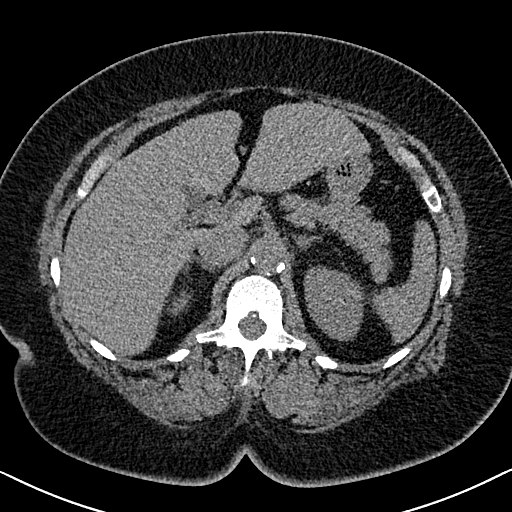
[im 11/148  lung]
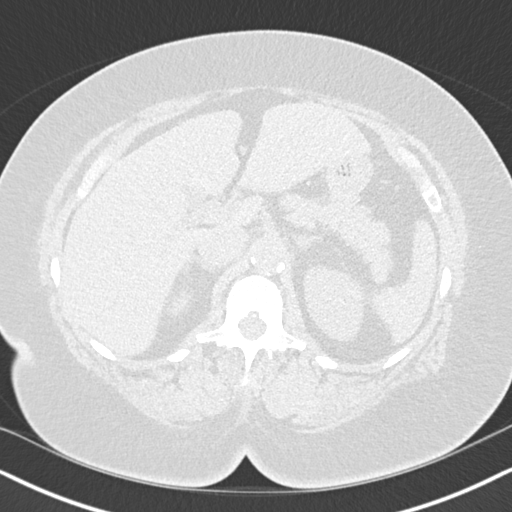
[im 22/148  lung]
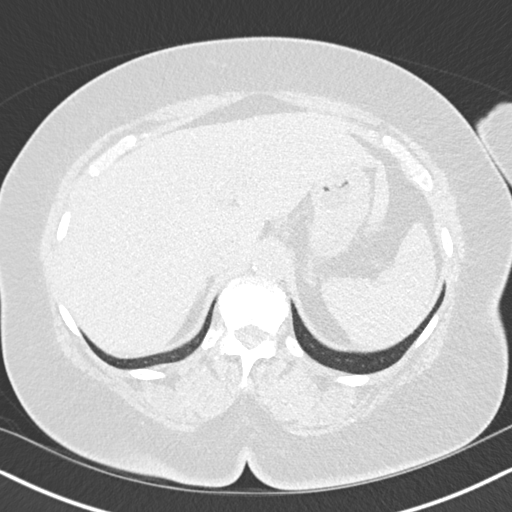
[im 33/148  lung]
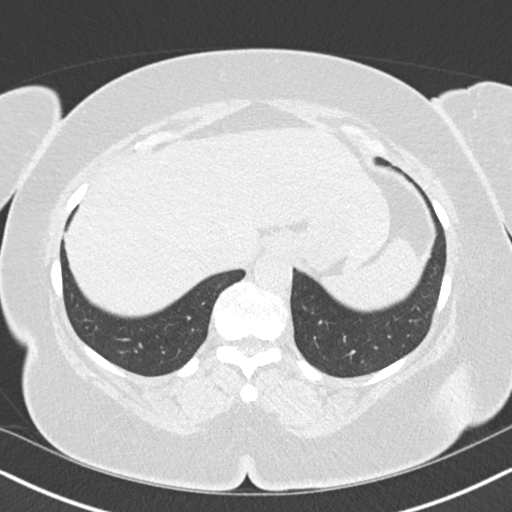
[im 44/148  lung]
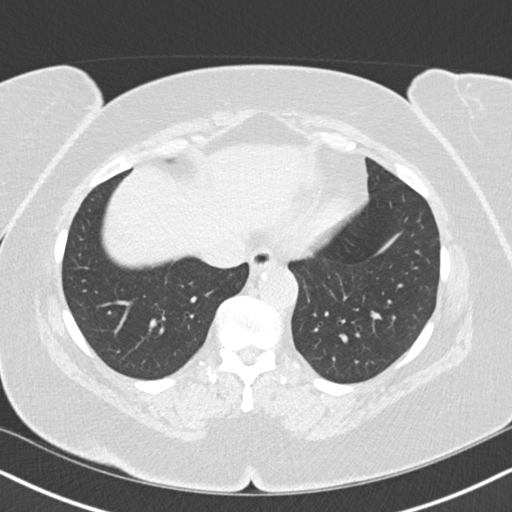
[im 55/148  mediastinal]
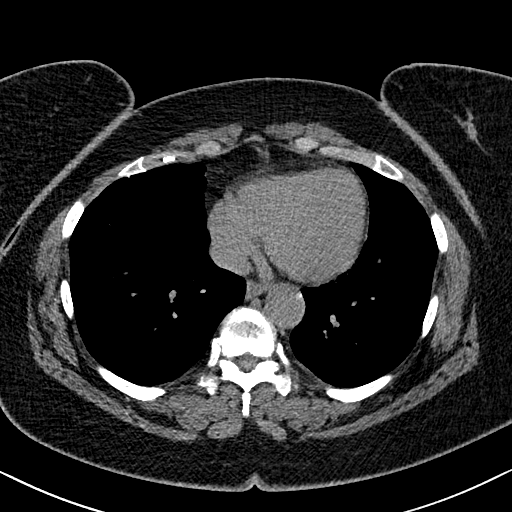
[im 55/148  lung]
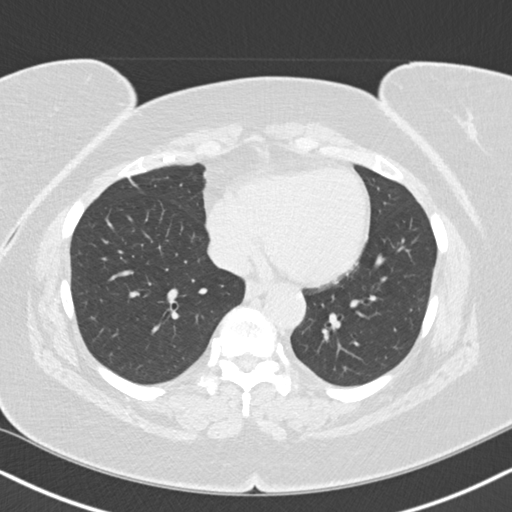
[im 66/148  lung]
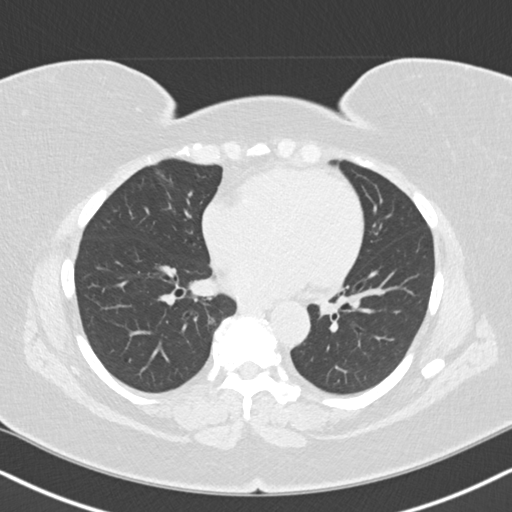
[im 82/148  lung]
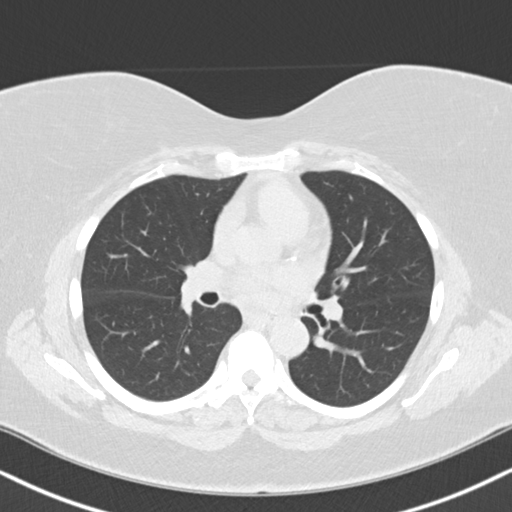
[im 93/148  lung]
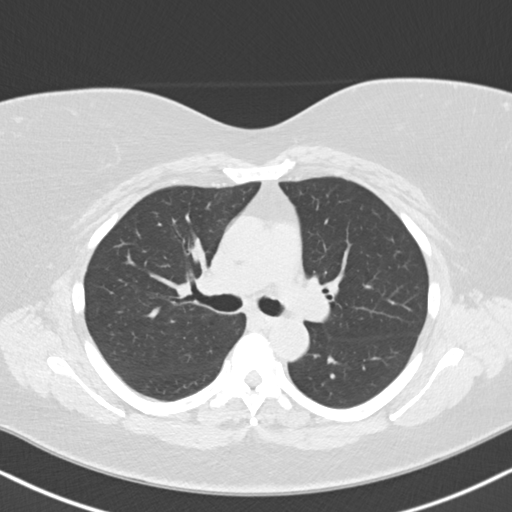
[im 104/148  mediastinal]
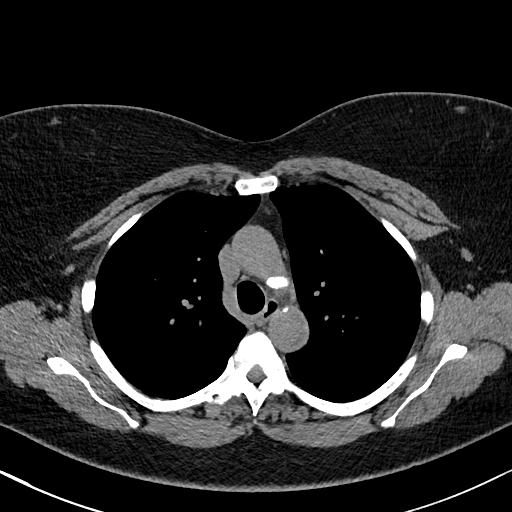
[im 104/148  lung]
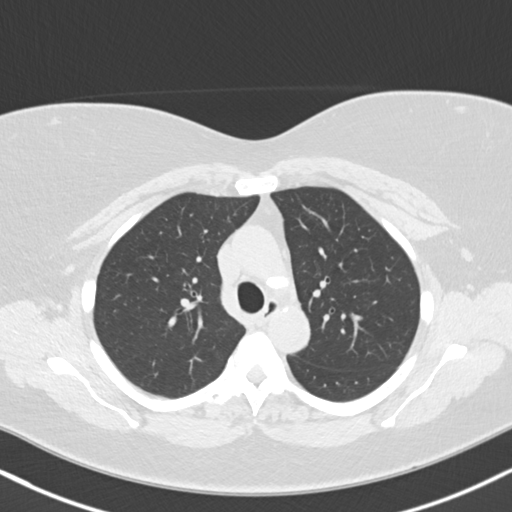
[im 115/148  lung]
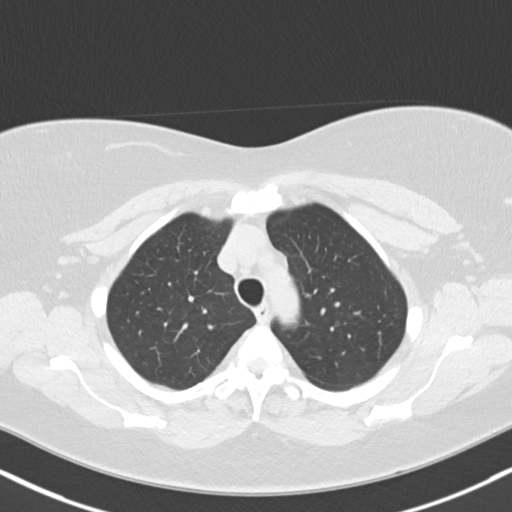
[im 126/148  lung]
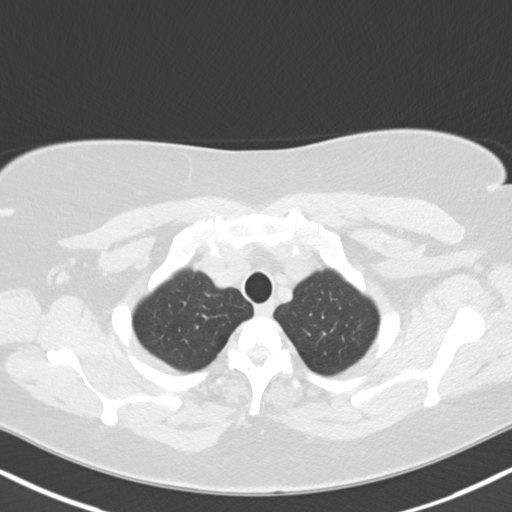
[im 137/148  lung]
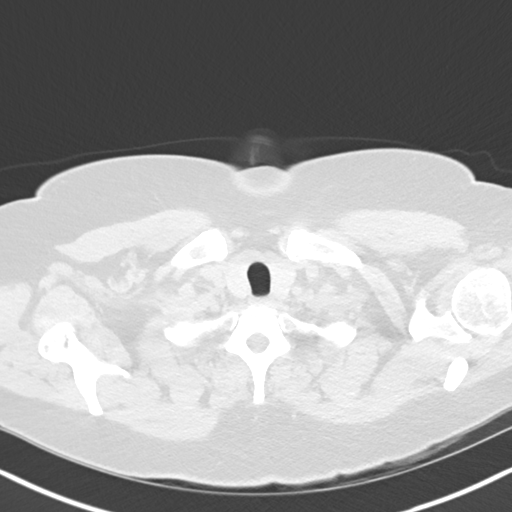

[Series 5: coronal · coronal · 0.59mm/px · 3 of 119 slices shown]
[im 24/119  lung]
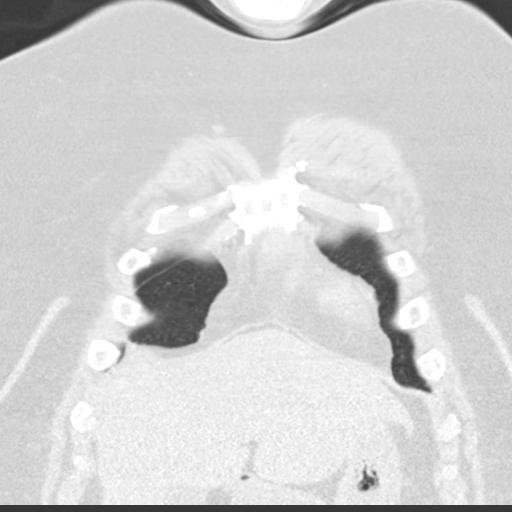
[im 48/119  lung]
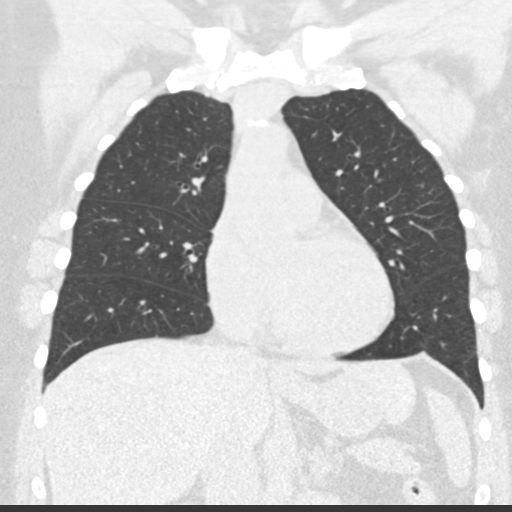
[im 71/119  lung]
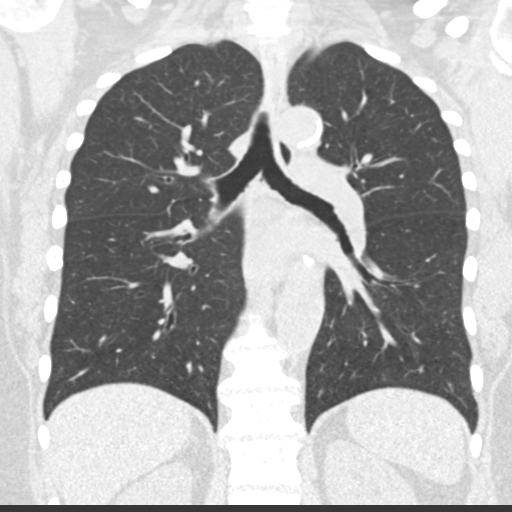

[15 of 36 positions shown; findings below may reference images not displayed]

FINDINGS: Cardiovascular: Aortic atherosclerosis. Minimal scattered left
coronary artery calcification. Normal heart size. No pericardial
effusion.

Mediastinum/Nodes: No enlarged mediastinal, hilar, or axillary lymph
nodes. Thyroid gland, trachea, and esophagus demonstrate no
significant findings.

Lungs/Pleura: Minimal bandlike scarring of the right middle lobe. A
previously noted right lower lobe ground-glass pulmonary nodule is
resolved, consistent with resolution of infection or inflammation.
Unchanged 3 mm subpleural nodule of the left upper lobe (series 3,
image 58). No pleural effusion or pneumothorax.

Upper Abdomen: No acute abnormality. Benign, fat containing left
adrenal adenoma.

Musculoskeletal: No chest wall mass or suspicious bone lesions
identified.
IMPRESSION: A previously noted right lower lobe ground-glass pulmonary nodule is
resolved, consistent with resolution of infection or inflammation.
Unchanged 3 mm subpleural nodule of the left upper lobe (series 3,
image 58). Further follow-up of the latter nodule is optional at 12
months if there are high risk factors for lung cancer. Consider
annual CT lung cancer screening if indicated by patient age, smoking
history, and/or other risk factors for lung cancer.

## 2021-04-24 NOTE — Progress Notes (Signed)
Subjective: ? Darlene Wade is a 66 y.o. female who presents for ?Chief Complaint  ?Patient presents with  ? diabetes check  ?  Diabetes check- sometimes when she eats, feels like something is in throat and she needs to throw up.   ?   ?Here for med check ? ?Diabetes-blood sugars fasting less than 130.  Compliant with medicine without complaint.  No recent foot lesions.  No polyuria, polydipsia, vision changes ? ?Hypertension-compliant with medications ? ?Hyperlipidemia-compliant with statin and aspirin daily.  No side effects with medications ? ?She has a bone density test scheduled for May 2023 ? ?She sees Dr. Berenice Primas at [facility] about her knees.  She has been having a lot of trouble with her right shoulder and is seeing a specialist at [facility] for her shoulder soon ? ?No other aggravating or relieving factors.   ? ?No other c/o. ? ?Past Medical History:  ?Diagnosis Date  ? Asthma 07/20/2018  ? one puff per day  ? Chronic combined systolic and diastolic heart failure ([place]) 03/03/2018  ? Echo 07/2018: EF 40-45, diff HK worse in Inf base, normal RVSF  ? Diabetes mellitus without complication ([place]) 6378  ? Diabetic eye exam ([place])   ? [facility]  ? Dyspnea   ? Elbow fracture, right 2007  ? Former smoker   ? 20 pack year history, quit 2010  ? Hyperlipidemia   ? Hypertension   ? Insomnia   ? Lung nodule   ? Chest CT 07/2018:  RLL nodule resolved.  3 mm subpleural LUL nodule.  Repeat in 1 year if high risk.   ? Obesity   ? PONV (postoperative nausea and vomiting) 2014  ? 1 time  ? Wears glasses   ? reading  ? ?Current Outpatient Medications on File Prior to Visit  ?Medication Sig Dispense Refill  ? aspirin EC 81 MG tablet Take 1 tablet (81 mg total) by mouth daily. 90 tablet 3  ? atorvastatin (LIPITOR) 40 MG tablet TAKE 1 TABLET(40 MG) BY MOUTH DAILY 90 tablet 3  ? carvedilol (COREG) 6.25 MG tablet Take 1 tablet (6.25 mg total) by mouth 2 (two) times daily. 180 tablet 3  ? celecoxib (CELEBREX) 200  MG capsule     ? cholecalciferol (VITAMIN D3) 25 MCG (1000 UNIT) tablet Take 2 tablets (2,000 Units total) by mouth daily. 180 tablet 3  ? dapagliflozin propanediol (FARXIGA) 5 MG TABS tablet Take 1 tablet (5 mg total) by mouth daily before breakfast. 90 tablet 1  ? Multiple Vitamin (MULTIVITAMIN) tablet Take 1 tablet by mouth daily.    ? sacubitril-valsartan (ENTRESTO) 49-51 MG Take 1 tablet by mouth 2 (two) times daily. 60 tablet 11  ? sitaGLIPtin-metformin (JANUMET) 50-500 MG tablet Take 1 tablet by mouth 2 (two) times daily with a meal. 180 tablet 3  ? ACCU-CHEK GUIDE test strip TEST 1 TO 2 TIMES DAILY 100 strip 2  ? Blood Glucose Monitoring Suppl (ACCU-CHEK AVIVA PLUS) w/Device KIT Test blood sugar 2 times daily 1 kit 0  ? Blood Glucose Monitoring Suppl (TRUE METRIX METER) DEVI Test 1-2 times a day 100 each 1  ? glucose blood (TRUE METRIX BLOOD GLUCOSE TEST) test strip Test 1-2 times a day 100 each 12  ? TRUEplus Lancets 28G MISC TEST BLOOD SUGAR 1 TO 2 TIMES DAILY 200 each 1  ? ?No current facility-administered medications on file prior to visit.  ? ? ? ?The following portions of the patient's history were reviewed and  updated as appropriate: allergies, current medications, past family history, past medical history, past social history, past surgical history and problem list. ? ?ROS ?Otherwise as in subjective above ? ? ? ?Objective: ?BP 124/84   Pulse 100   Wt 214 lb 12.8 oz (97.4 kg)   BMI 38.05 kg/m?  ? ?General appearance: alert, no distress, well developed, well nourished ?Neck: supple, no lymphadenopathy, no thyromegaly, no masses, no bruits or JVD ?Heart: RRR, normal S1, S2, no murmurs ?Lungs: CTA bilaterally, no wheezes, rhonchi, or rales ?Pulses: 2+ radial pulses, 1+ pedal pulses, normal cap refill ?Ext: no edema ? ?Diabetic Foot Exam - Simple   ?Simple Foot Form ?Diabetic Foot exam was performed with the following findings: Yes 04/24/2021 12:03 PM  ?Visual Inspection ?See comments: Yes ?Sensation  Testing ?Intact to touch and monofilament testing bilaterally: Yes ?Pulse Check ?See comments: Yes ?Comments ?1+ pedal pulses, surgical scars of dorsal bilateral great toes, no other foot lesions ?  ? ? ? ?Assessment: ?Encounter Diagnoses  ?Name Primary?  ? Diabetes mellitus with complication ([place]) Yes  ? Essential hypertension, benign   ? Hyperlipidemia associated with type 2 diabetes mellitus ([place])   ? Estrogen deficiency   ? Chronic right shoulder pain   ? Aortic atherosclerosis ([place])   ? Vaccine counseling   ? Decreased pedal pulses   ? ? ? ?Plan: ?Diabetes-hemoglobin A1c at goal.  Continue Janumet, [place].  Continue glucose monitoring.  Continue work on healthy low sugar diet regular exercise ? ?Hypertension-compliant with medications, sees cardiology ? ?Aortic atherosclerosis, hyperlipidemia associated diabetes-continue statin, aspirin, low-cholesterol diet ? ?Estrogen deficiency-she has bone density test planned for May 2022 ? ?Chronic right shoulder pain-she will be seeing orthopedics soon for consult.  She was referred to a different orthopedist within the same office by Dr. Berenice Primas at [facility] ? ?Decreased pedal pulses -he had a mildly abnormal ABI screen on the right last year.  Normal on the left.  We discussed this finding.  For now she will continue with walking aspirin and statin.  She declines vascular surgery consult at this time.  She is asymptomatic at this time for claudication ? ?Vaccine counseling ?She will check with her pharmacy and insurance about updating shingles and tetanus vaccine ? ?Darlene Wade was seen today for diabetes check. ? ?Diagnoses and all orders for this visit: ? ?Diabetes mellitus with complication ([place]) ?-     HgB A1c ? ?Essential hypertension, benign ? ?Hyperlipidemia associated with type 2 diabetes mellitus ([place]) ? ?Estrogen deficiency ? ?Chronic right shoulder pain ? ?Aortic atherosclerosis ([place]) ? ?Vaccine counseling ? ?Decreased pedal pulses ? ? ? ?Follow up: [facility]  fasting well visit ? ? ? ?

## 2021-04-24 NOTE — Patient Instructions (Signed)
Check with pharmacy and insurance about updating tetanus and shingles boosters ? ? ?

## 2021-06-12 ENCOUNTER — Ambulatory Visit
Admission: RE | Admit: 2021-06-12 | Discharge: 2021-06-12 | Disposition: A | Discharge status: Home / Self Care | Payer: Medicare HMO | Source: Ambulatory Visit | Attending: Medical | Admitting: Medical

## 2021-06-12 DIAGNOSIS — Z780 Asymptomatic menopausal state: Secondary | ICD-10-CM | POA: Diagnosis not present

## 2021-06-12 DIAGNOSIS — Z0000 Encounter for general adult medical examination without abnormal findings: Secondary | ICD-10-CM

## 2021-06-12 DIAGNOSIS — Z8781 Personal history of (healed) traumatic fracture: Secondary | ICD-10-CM

## 2021-06-24 ENCOUNTER — Other Ambulatory Visit: Payer: Self-pay | Admitting: Medical

## 2021-07-26 ENCOUNTER — Telehealth: Payer: Self-pay

## 2021-07-26 NOTE — Telephone Encounter (Signed)
Left message to see if pt needs samples of Janumet

## 2021-07-26 NOTE — Telephone Encounter (Signed)
Pt is in donut hole and needs samples Janumet and [place].  We have plenty Janumet but only have Farxiga '10mg'$ ,   I left a message for the [place] rep.  We have only have '10mg'$ , Audelia Acton can the [place] '10mg'$  be cut in half?

## 2021-08-03 ENCOUNTER — Other Ambulatory Visit: Payer: Self-pay | Admitting: Medical

## 2021-09-13 ENCOUNTER — Encounter: Payer: Self-pay | Admitting: Cardiovascular Disease

## 2021-09-13 NOTE — Progress Notes (Signed)
Cardiology Office Note:    Date:  09/14/2021   ID:  Darlene Wade, DOB 1956-01-17, MRN [id number]  PCP:  Carlena Hurl, PA-C  Cardiologist:  New to [place]  Electrophysiologist:  None   Referring MD: Carlena Hurl, PA-C   Problem List 1. Diabetes 2. HTN 3. Hyperlipidemia  4.  Osteoarthritis   Chief Complaint  Patient presents with   Congestive Heart Failure     Dec. 2, 2019:    Darlene Wade is a 66 y.o. female with a hx of hypertension, hyperlipidemia, Diabetes  Her primary ordered on echo  She was found to have a very mildly reduced left ventricular systolic function ejection fraction of 40 to 45%.  She has grade 1 diastolic dysfunction.  Was exercising regularly in the gym but her knees started causing her lots of pain  Now she is walking 30 minutes a day  Drives a school bus   She does not eat  Lots of salt Has lost 40 lbs over the past several months - has been paying attention to her diet  Is also on Invokana   Nov. 23, 2021:  Darlene Wade is seen today for follow-up visit.  She has chronic combined systolic and diastolic congestive heart failure. Last echocardiogram was June, 2020.  She has a left ventricular systolic function with an EF of 40 to 45%.  At that time she had normal diastolic parameters. Has not gained any weight .   Trying to watch her diet .  Wt today is 217 lbs ( down 3 lbs since Dec. 2019. She has had knee surgery since then.   Thinking about getting the other knee done   Lipids are managed by her primary md, labs were reviewed ,  Look great  December 26, 2020: Darlene Wade seen today for follow-up of her chronic combined systolic and diastolic congestive heart failure.  Last echocardiogram was performed in June, 2020.  Her ejection fraction is 40 to 45%. Had n Right TKA in June, 2022  Is back exercising ,  no CP or dyspnea Is still rehabbing her knee.   She is in the donut hole currently.  We would like to start her on Entresto but this may  be cost prohibitive for now.  The plan is for her to start Entresto 49-51 p.o. twice daily starting on January 1.  She will stop the losartan at that time.  We will check a basic metabolic profile approximately 2 to 3 weeks after she starts the [place]. Anticipate that she will be able to take the Entresto until about October or November of next year at which time she will change back to losartan 100 mg a day.  I will see her in 6 months.  Anticipate getting an echocardiogram if she has been able to tolerate the Entresto.  Hopefully we will see some further improvement of her left ventricular function.  Aug. 4, 2023 Darlene Wade is seen for follow up of her CHF Is on entresto 49-51 bid Watches her salt Walks ( 11,000 steps a day ) Is on Farxiga Jardiance caused yeast infection  Will add spironolactone      Past Medical History:  Diagnosis Date   Asthma 07/20/2018   one puff per day   Chronic combined systolic and diastolic heart failure ([place]) 03/03/2018   Echo 07/2018: EF 40-45, diff HK worse in Inf base, normal RVSF   Diabetes mellitus without complication ([place]) 3664   Diabetic eye exam ([place])  [facility]   Dyspnea    Elbow fracture, right 2007   Former smoker    20 pack year history, quit 2010   Hyperlipidemia    Hypertension    Insomnia    Lung nodule    Chest CT 07/2018:  RLL nodule resolved.  3 mm subpleural LUL nodule.  Repeat in 1 year if high risk.    Obesity    PONV (postoperative nausea and vomiting) 2014   1 time   Wears glasses    reading    Past Surgical History:  Procedure Laterality Date   BREAST CYST ASPIRATION  2015   BREAST EXCISIONAL BIOPSY Left    BREAST LUMPECTOMY WITH NEEDLE LOCALIZATION Left 10/07/2012   Procedure: BREAST LUMPECTOMY WITH NEEDLE LOCALIZATION;  Surgeon: Imogene Burn. Georgette Dover, MD;  Location: [facility];  Service: General;  Laterality: Left;   BREAST SURGERY     COLONOSCOPY  01/2016   01/2016 with Dr. Loletha Carrow, tubular adenoma  polpy; 2003 with Dr. Collene Mares   ELBOW ARTHROPLASTY  2006   rt-fx   FOOT ARTHROTOMY  12/2010   foot fusion, right   FOOT MASS EXCISION  04/2012   left-fusion   HYSTERECTOMY ABDOMINAL WITH SALPINGECTOMY Bilateral 1985   PARTIAL HYSTERECTOMY  age 21   uterine fibroids, still has ovaries   TONSILLECTOMY     TOTAL KNEE ARTHROPLASTY Left 08/10/2018   Procedure: Left Knee Arthroplasty;  Surgeon: Frederik Pear, MD;  Location: [facility];  Service: Orthopedics;  Laterality: Left;    Current Medications: Current Meds  Medication Sig   ACCU-CHEK GUIDE test strip TEST 1 TO 2 TIMES DAILY   aspirin EC 81 MG tablet Take 1 tablet (81 mg total) by mouth daily.   atorvastatin (LIPITOR) 40 MG tablet Take 1 tablet by mouth once daily   Blood Glucose Monitoring Suppl (ACCU-CHEK AVIVA PLUS) w/Device KIT Test blood sugar 2 times daily   Blood Glucose Monitoring Suppl (TRUE METRIX METER) DEVI Test 1-2 times a day   carvedilol (COREG) 6.25 MG tablet Take 1 tablet (6.25 mg total) by mouth 2 (two) times daily.   cholecalciferol (VITAMIN D3) 25 MCG (1000 UNIT) tablet Take 2 tablets (2,000 Units total) by mouth daily.   dapagliflozin propanediol (FARXIGA) 5 MG TABS tablet Take 1 tablet (5 mg total) by mouth daily before breakfast.   glucose blood (TRUE METRIX BLOOD GLUCOSE TEST) test strip Test 1-2 times a day   JANUMET 50-500 MG tablet TAKE 1 TABLET BY MOUTH TWICE DAILY WITH MEALS   Multiple Vitamin (MULTIVITAMIN) tablet Take 1 tablet by mouth daily.   sacubitril-valsartan (ENTRESTO) 49-51 MG Take 1 tablet by mouth 2 (two) times daily.   spironolactone (ALDACTONE) 25 MG tablet Take 1 tablet (25 mg total) by mouth daily.   TRUEplus Lancets 28G MISC TEST BLOOD SUGAR 1 TO 2 TIMES DAILY     Allergies:   Jardiance [empagliflozin] and Naproxen   Social History   Socioeconomic History   Marital status: Married    Spouse name: Not on file   Number of children: Not on file   Years of education: Not on file   Highest  education level: Not on file  Occupational History   Not on file  Tobacco Use   Smoking status: Former    Packs/day: 1.00    Years: 20.00    Total pack years: 20.00    Types: Cigarettes    Quit date: 04/11/2009    Years since quitting: 12.4   Smokeless  tobacco: Never  Vaping Use   Vaping Use: Never used  Substance and Sexual Activity   Alcohol use: Yes    Alcohol/week: 1.0 standard drink of alcohol    Types: 1 Glasses of wine per week    Comment: rare   Drug use: No   Sexual activity: Not on file  Other Topics Concern   Not on file  Social History Narrative   Separated from husband (2013). Living with husband.  Walking for exercise 2-3 x per week.  Retired, Set designer in the past.  Has 2 grandchildren, teenagers.   Driving school bus.   12/2020   Social Determinants of Health   Financial Resource Strain: Not on file  Food Insecurity: Not on file  Transportation Needs: Not on file  Physical Activity: Not on file  Stress: Not on file  Social Connections: Not on file     Family History: The patient's family history includes Diabetes in her maternal aunt and mother; Heart disease in her maternal grandfather; Hypertension in her mother. There is no history of Cancer or Breast cancer.  ROS:   Please see the history of present illness.     All other systems reviewed and are negative.  EKGs/Labs/Other Studies Reviewed:    The following studies were reviewed today:    Recent Labs: 12/19/2020: ALT 11; Hemoglobin 12.0; Platelets 267 03/05/2021: BUN 15; Creatinine, Ser 0.68; Potassium 4.0; Sodium 140  Recent Lipid Panel    Component Value Date/Time   CHOL 144 12/19/2020 1527   TRIG 69 12/19/2020 1527   HDL 63 12/19/2020 1527   CHOLHDL 2.3 12/19/2020 1527   CHOLHDL 2.0 05/22/2016 1014   VLDL 10 05/22/2016 1014   LDLCALC 67 12/19/2020 1527    Physical Exam:     Physical Exam: Blood pressure 134/78, pulse 72, height _0  (1.575 m), weight 213 lb 6.4 oz (96.8 kg),  SpO2 98 %.  GEN:  Well nourished, well developed in no acute distress HEENT: Normal NECK: No JVD; No carotid bruits LYMPHATICS: No lymphadenopathy CARDIAC: RRR , no murmurs, rubs, gallops RESPIRATORY:  Clear to auscultation without rales, wheezing or rhonchi  ABDOMEN: Soft, non-tender, non-distended MUSCULOSKELETAL:  No edema; No deformity  SKIN: Warm and dry NEUROLOGIC:  Alert and oriented x 3  EKG:   September 14, 2021: Normal sinus rhythm at 72.  Poor R wave progression, likely due to lead placement.  ASSESSMENT:    1. Chronic combined systolic and diastolic heart failure ([place])   2. Chronic systolic CHF (congestive heart failure) ([place])   3. Essential hypertension, benign   4. Morbid obesity ([place])   5. Hyperlipidemia associated with type 2 diabetes mellitus ([place])      PLAN:       1.  Mild chronic ystolic  congestive heart failure:   Currently on Entresto, Farxiga, carvedilol.  We will add spironolactone 25 mg a day.  We will check basic metabolic profile in 2 weeks.        2.  Hyperlipidemia: Lipids been fairly well controlled.  Continue atorvastatin 40 mg a day.  We will check lipids and ALT in 2 weeks.  3.  Obesity: Encouraged her to work on carb restriction and continue to exercise.   Medication Adjustments/Labs and Tests Ordered: Current medicines are reviewed at length with the patient today.  Concerns regarding medicines are outlined above.  Orders Placed This Encounter  Procedures   Basic metabolic panel   ALT   Lipid panel   EKG 12-Lead  ECHOCARDIOGRAM COMPLETE    Meds ordered this encounter  Medications   spironolactone (ALDACTONE) 25 MG tablet    Sig: Take 1 tablet (25 mg total) by mouth daily.    Dispense:  90 tablet    Refill:  3     Patient Instructions  Medication Instructions:  START Spironolactone 56m daily *If you need a refill on your cardiac medications before your next appointment, please call your pharmacy*   Lab Work: BMET,  LIPIDS, ALT in 2 weeks If you have labs (blood work) drawn today and your tests are completely normal, you will receive your results only by: [facility](if you have MyChart) OR A paper copy in the mail If you have any lab test that is abnormal or we need to change your treatment, we will call you to review the results.   Testing/Procedures: ECHO Your physician has requested that you have an echocardiogram. Echocardiography is a painless test that uses sound waves to create images of your heart. It provides your doctor with information about the size and shape of your heart and how well your heart's chambers and valves are working. This procedure takes approximately one hour. There are no restrictions for this procedure.  Follow-Up: [facility] you and your health needs are our priority.  As part of our continuing mission to provide you with exceptional heart care, we have created designated Provider Care Teams.  These Care Teams include your primary Cardiologist (physician) and Advanced Practice Providers (APPs -  Physician Assistants and Nurse Practitioners) who all work together to provide you with the care you need, when you need it.  We recommend signing up for the patient portal called "MyChart".  Sign up information is provided on this After Visit Summary.  MyChart is used to connect with patients for Virtual Visits (Telemedicine).  Patients are able to view lab/test results, encounter notes, upcoming appointments, etc.  Non-urgent messages can be sent to your provider as well.   To learn more about what you can do with MyChart, go to [email or web address]    Your next appointment:   1 year(s)  The format for your next appointment:   In Person  Provider:   NJacklynn Lewis or [employer]{     Important Information About Sugar         Signed, PMertie Moores MD  09/14/2021 9:00 AM    [place]

## 2021-09-14 ENCOUNTER — Ambulatory Visit: Payer: Medicare HMO | Admitting: Cardiovascular Disease

## 2021-09-14 ENCOUNTER — Encounter: Payer: Self-pay | Admitting: Cardiovascular Disease

## 2021-09-14 VITALS — BP 134/78 | HR 72 | Ht 62.0 in | Wt 213.4 lb

## 2021-09-14 DIAGNOSIS — E785 Hyperlipidemia, unspecified: Secondary | ICD-10-CM | POA: Diagnosis not present

## 2021-09-14 DIAGNOSIS — I5042 Chronic combined systolic (congestive) and diastolic (congestive) heart failure: Secondary | ICD-10-CM | POA: Diagnosis not present

## 2021-09-14 DIAGNOSIS — I5022 Chronic systolic (congestive) heart failure: Secondary | ICD-10-CM | POA: Diagnosis not present

## 2021-09-14 DIAGNOSIS — I10 Essential (primary) hypertension: Secondary | ICD-10-CM | POA: Diagnosis not present

## 2021-09-14 DIAGNOSIS — E1169 Type 2 diabetes mellitus with other specified complication: Secondary | ICD-10-CM | POA: Diagnosis not present

## 2021-09-14 MED ORDER — SPIRONOLACTONE 25 MG PO TABS: 25.0000 mg | ORAL_TABLET | Freq: Every day | ORAL | 3 refills | Status: DC

## 2021-09-14 NOTE — Patient Instructions (Signed)
Medication Instructions:  START Spironolactone '25mg'$  daily *If you need a refill on your cardiac medications before your next appointment, please call your pharmacy*   Lab Work: BMET, LIPIDS, ALT in 2 weeks If you have labs (blood work) drawn today and your tests are completely normal, you will receive your results only by: [place] (if you have MyChart) OR A paper copy in the mail If you have any lab test that is abnormal or we need to change your treatment, we will call you to review the results.   Testing/Procedures: ECHO Your physician has requested that you have an echocardiogram. Echocardiography is a painless test that uses sound waves to create images of your heart. It provides your doctor with information about the size and shape of your heart and how well your heart's chambers and valves are working. This procedure takes approximately one hour. There are no restrictions for this procedure.  Follow-Up: At [facility], you and your health needs are our priority.  As part of our continuing mission to provide you with exceptional heart care, we have created designated Provider Care Teams.  These Care Teams include your primary Cardiologist (physician) and Advanced Practice Providers (APPs -  Physician Assistants and Nurse Practitioners) who all work together to provide you with the care you need, when you need it.  We recommend signing up for the patient portal called "MyChart".  Sign up information is provided on this After Visit Summary.  MyChart is used to connect with patients for Virtual Visits (Telemedicine).  Patients are able to view lab/test results, encounter notes, upcoming appointments, etc.  Non-urgent messages can be sent to your provider as well.   To learn more about what you can do with MyChart, go to [email or web address].    Your next appointment:   1 year(s)  The format for your next appointment:   In Person  Provider:   [place], Ann Maki, or [employer]  {     Important Information About Sugar

## 2021-09-18 NOTE — Telephone Encounter (Signed)
#  4 samples given

## 2021-09-28 ENCOUNTER — Telehealth: Payer: Self-pay | Admitting: Cardiovascular Disease

## 2021-09-28 ENCOUNTER — Ambulatory Visit (HOSPITAL_COMMUNITY): Payer: Medicare HMO | Attending: Cardiology

## 2021-09-28 ENCOUNTER — Other Ambulatory Visit: Payer: Medicare HMO

## 2021-09-28 DIAGNOSIS — I5022 Chronic systolic (congestive) heart failure: Secondary | ICD-10-CM | POA: Diagnosis not present

## 2021-09-28 DIAGNOSIS — I5042 Chronic combined systolic (congestive) and diastolic (congestive) heart failure: Secondary | ICD-10-CM

## 2021-09-28 DIAGNOSIS — E785 Hyperlipidemia, unspecified: Secondary | ICD-10-CM | POA: Diagnosis not present

## 2021-09-28 DIAGNOSIS — E1169 Type 2 diabetes mellitus with other specified complication: Secondary | ICD-10-CM

## 2021-09-28 DIAGNOSIS — I10 Essential (primary) hypertension: Secondary | ICD-10-CM | POA: Diagnosis not present

## 2021-09-28 LAB — BASIC METABOLIC PANEL
BUN/Creatinine Ratio: 22 (ref 12–28)
BUN: 19 mg/dL (ref 8–27)
CO2: 23 mmol/L (ref 20–29)
Calcium: 10 mg/dL (ref 8.7–10.3)
Chloride: 104 mmol/L (ref 96–106)
Creatinine, Ser: 0.86 mg/dL (ref 0.57–1.00)
Glucose: 122 mg/dL — ABNORMAL HIGH (ref 70–99)
Potassium: 4.3 mmol/L (ref 3.5–5.2)
Sodium: 140 mmol/L (ref 134–144)
eGFR: 74 mL/min/{1.73_m2} (ref >59)

## 2021-09-28 LAB — ECHOCARDIOGRAM COMPLETE
Area-P 1/2: 2.64 cm2
S' Lateral: 3.1 cm

## 2021-09-28 LAB — LIPID PANEL
Chol/HDL Ratio: 2 ratio (ref 0.0–4.4)
Cholesterol, Total: 128 mg/dL (ref 100–199)
HDL: 63 mg/dL (ref >39)
LDL Chol Calc (NIH): 50 mg/dL (ref 0–99)
Triglycerides: 74 mg/dL (ref 0–149)
VLDL Cholesterol Cal: 15 mg/dL (ref 5–40)

## 2021-09-28 LAB — ALT: ALT: 10 IU/L (ref 0–32)

## 2021-09-28 NOTE — Telephone Encounter (Signed)
Informed pt MD and RN were not in the office today to address. Aware will forward to them to review and follow up w/ her next week. Pt is going to hold the medication until she hears back from our office. States MD did mention that adding this medication may to much for her to handle.

## 2021-09-28 NOTE — Telephone Encounter (Signed)
Pt c/o medication issue:  1. Name of Medication:   spironolactone (ALDACTONE) 25 MG tablet    2. How are you currently taking this medication (dosage and times per day)? Take 1 tablet (25 mg total) by mouth daily.  3. Are you having a reaction (difficulty breathing--STAT)? No  4. What is your medication issue? Pt says that since starting medication she has been having stomach aches and feeling sluggish. Please advise

## 2021-10-02 NOTE — Telephone Encounter (Signed)
Nahser, Darlene Cheng, Darlene Wade     Pt did not tolerate addition of spironolactone 25 mg .  Ok to hold for now  We can discuss trying spiro 12.5 mg at a later time  For now, the patient is doing well on current meds  Keep follow up appt as previously scheduled   PN    Called and spoke with patient. She understands to stop the Spironolactone until she sees Darlene Wade again and will discuss potentially re-starting at 1/2 dose. Removed from med list at this time.

## 2021-10-17 ENCOUNTER — Encounter: Payer: Self-pay | Admitting: Internal Medicine

## 2021-10-18 DIAGNOSIS — H52223 Regular astigmatism, bilateral: Secondary | ICD-10-CM | POA: Diagnosis not present

## 2021-10-18 DIAGNOSIS — E119 Type 2 diabetes mellitus without complications: Secondary | ICD-10-CM | POA: Diagnosis not present

## 2021-10-18 LAB — HM DIABETES EYE EXAM

## 2021-10-27 ENCOUNTER — Other Ambulatory Visit: Payer: Self-pay | Admitting: Medical

## 2021-10-30 ENCOUNTER — Encounter: Payer: Self-pay | Admitting: Medical

## 2021-10-30 ENCOUNTER — Encounter: Payer: Self-pay | Admitting: Internal Medicine

## 2021-10-31 ENCOUNTER — Other Ambulatory Visit: Payer: Self-pay | Admitting: Medical

## 2021-10-31 DIAGNOSIS — Z1231 Encounter for screening mammogram for malignant neoplasm of breast: Secondary | ICD-10-CM

## 2021-11-01 DIAGNOSIS — Z1231 Encounter for screening mammogram for malignant neoplasm of breast: Secondary | ICD-10-CM

## 2021-11-02 ENCOUNTER — Telehealth: Payer: Self-pay | Admitting: Licensed Clinical Social Worker

## 2021-11-02 NOTE — Patient Outreach (Signed)
  Care Coordination   11/02/2021 Name: Darlene Wade MRN: [id number] DOB: 1955-03-19   Care Coordination Outreach Attempts:  An unsuccessful telephone outreach was attempted today to offer the patient information about available care coordination services as a benefit of their health plan.   Follow Up Plan:  Additional outreach attempts will be made to offer the patient care coordination information and services.   Encounter Outcome:  No Answer  Care Coordination Interventions Activated:  No   Care Coordination Interventions:  No, not indicated    Christa See, MSW, [email or web address] Phone [phone] 4:59 PM

## 2021-11-05 ENCOUNTER — Telehealth: Payer: Self-pay | Admitting: Licensed Clinical Social Worker

## 2021-11-09 NOTE — Patient Outreach (Signed)
  Care Coordination   Initial Visit Note   11/09/2021 Name: Darlene Wade MRN: [id number] DOB: [birth year]  Darlene Wade is a 66 y.o. year old female who sees Tysinger, Camelia Eng, PA-C for primary care. I spoke with  Darlene Wade by phone today.  What matters to the patients health and wellness today?  Care Coordination    Goals Addressed             This Visit's Progress    COMPLETED: Care Coordination Activities-No Follow Up Required       Care Coordination Interventions: Active listening / Reflection utilized  LCSW informed patient of care coordination services. Pt is not interested at this time and agreed to contact PCP, should needs arise         SDOH assessments and interventions completed:  No     Care Coordination Interventions Activated:  Yes  Care Coordination Interventions:  Yes, provided   Follow up plan: No further intervention required.   Encounter Outcome:  Pt. Refused   Darlene Wade, MSW, [email or web address] Phone [phone] 12:37 PM

## 2021-11-13 ENCOUNTER — Encounter: Payer: Self-pay | Admitting: Medical

## 2021-11-13 ENCOUNTER — Ambulatory Visit (INDEPENDENT_AMBULATORY_CARE_PROVIDER_SITE_OTHER): Payer: Medicare HMO | Admitting: Medical

## 2021-11-13 VITALS — BP 110/60 | HR 84 | Ht 62.0 in | Wt 212.8 lb

## 2021-11-13 DIAGNOSIS — I700 Atherosclerosis of aorta: Secondary | ICD-10-CM

## 2021-11-13 DIAGNOSIS — E2839 Other primary ovarian failure: Secondary | ICD-10-CM

## 2021-11-13 DIAGNOSIS — R0989 Other specified symptoms and signs involving the circulatory and respiratory systems: Secondary | ICD-10-CM

## 2021-11-13 DIAGNOSIS — J4520 Mild intermittent asthma, uncomplicated: Secondary | ICD-10-CM | POA: Diagnosis not present

## 2021-11-13 DIAGNOSIS — E1169 Type 2 diabetes mellitus with other specified complication: Secondary | ICD-10-CM

## 2021-11-13 DIAGNOSIS — Z1211 Encounter for screening for malignant neoplasm of colon: Secondary | ICD-10-CM

## 2021-11-13 DIAGNOSIS — Z23 Encounter for immunization: Secondary | ICD-10-CM | POA: Diagnosis not present

## 2021-11-13 DIAGNOSIS — Z0000 Encounter for general adult medical examination without abnormal findings: Secondary | ICD-10-CM

## 2021-11-13 DIAGNOSIS — I2510 Atherosclerotic heart disease of native coronary artery without angina pectoris: Secondary | ICD-10-CM

## 2021-11-13 DIAGNOSIS — E559 Vitamin D deficiency, unspecified: Secondary | ICD-10-CM | POA: Diagnosis not present

## 2021-11-13 DIAGNOSIS — Z96652 Presence of left artificial knee joint: Secondary | ICD-10-CM

## 2021-11-13 DIAGNOSIS — Z780 Asymptomatic menopausal state: Secondary | ICD-10-CM

## 2021-11-13 DIAGNOSIS — Z7189 Other specified counseling: Secondary | ICD-10-CM

## 2021-11-13 DIAGNOSIS — Z79899 Other long term (current) drug therapy: Secondary | ICD-10-CM | POA: Diagnosis not present

## 2021-11-13 DIAGNOSIS — I10 Essential (primary) hypertension: Secondary | ICD-10-CM | POA: Diagnosis not present

## 2021-11-13 DIAGNOSIS — E118 Type 2 diabetes mellitus with unspecified complications: Secondary | ICD-10-CM

## 2021-11-13 DIAGNOSIS — Z7185 Encounter for immunization safety counseling: Secondary | ICD-10-CM

## 2021-11-13 DIAGNOSIS — E785 Hyperlipidemia, unspecified: Secondary | ICD-10-CM

## 2021-11-13 LAB — POCT URINALYSIS DIP (PROADVANTAGE DEVICE)
Bilirubin, UA: NEGATIVE
Blood, UA: NEGATIVE
Glucose, UA: >=1000 mg/dL — AB
Nitrite, UA: NEGATIVE
Protein Ur, POC: NEGATIVE mg/dL
Specific Gravity, Urine: 1.02
Urobilinogen, Ur: NEGATIVE
pH, UA: 6 (ref 5.0–8.0)

## 2021-11-13 NOTE — Patient Instructions (Signed)
This visit was a preventative care visit, also known as wellness visit or routine physical.   Topics typically include healthy lifestyle, diet, exercise, preventative care, vaccinations, sick and well care, proper use of emergency dept and after hours care, as well as other concerns.     Recommendations: Continue to return yearly for your annual wellness and preventative care visits.  This gives [place] a chance to discuss healthy lifestyle, exercise, vaccinations, review your chart record, and perform screenings where appropriate.  I recommend you see your eye doctor yearly for routine vision care.  I recommend you see your dentist yearly for routine dental care including hygiene visits twice yearly.   Vaccination recommendations were reviewed Immunization History  Administered Date(s) Administered   Fluad Quad(high Dose 65+) 11/16/2018, 11/17/2019, 11/28/2020   Influenza,inj,Quad PF,6+ Mos 09/28/2013, 09/21/2014, 12/19/2015, 12/27/2016, 11/13/2017   PFIZER(Purple Top)SARS-COV-2 Vaccination 03/18/2019, 04/08/2019, 11/13/2019   Pfizer Covid-19 Vaccine Bivalent Booster 7yr & up 11/28/2020   Pneumococcal Conjugate-13 03/03/2018   Pneumococcal Polysaccharide-23 11/13/2011, 06/13/2020   Tdap 11/13/2011    Counseled on the influenza virus vaccine.  Vaccine information sheet given.   High dose Influenza vaccine given after consent obtained.  Get your Shingrix and Tetanus boostes at your pharmacy soon.   Screening for cancer: Colon cancer screening: I reviewed your colonoscopy on file that is up to date from 2017.  Due repeat now  Breast cancer screening: You should perform a self breast exam monthly.   We reviewed recommendations for regular mammograms and breast cancer screening.   Skin cancer screening: Check your skin regularly for new changes, growing lesions, or other lesions of concern Come in for evaluation if you have skin lesions of concern.  Lung cancer screening: If you  have a greater than 20 pack year history of tobacco use, then you may qualify for lung cancer screening with a chest CT scan.   Please call your insurance company to inquire about coverage for this test.  We currently don't have screenings for other cancers besides breast, cervical, colon, and lung cancers.  If you have a strong family history of cancer or have other cancer screening concerns, please let me know.    Bone health: Get at least 150 minutes of aerobic exercise weekly Get weight bearing exercise at least once weekly Bone density test:  A bone density test is an imaging test that uses a type of X-ray to measure the amount of calcium and other minerals in your bones. The test may be used to diagnose or screen you for a condition that causes weak or thin bones (osteoporosis), predict your risk for a broken bone (fracture), or determine how well your osteoporosis treatment is working. The bone density test is recommended for females 637and older, or females or males <<84if certain risk factors such as thyroid disease, long term use of steroids such as for asthma or rheumatological issues, vitamin D deficiency, estrogen deficiency, family history of osteoporosis, self or family history of fragility fracture in first degree relative.  Bone Density test normal 2023.   Heart health: Get at least 150 minutes of aerobic exercise weekly Limit alcohol It is important to maintain a healthy blood pressure and healthy cholesterol numbers  Heart disease screening: Screening for heart disease includes screening for blood pressure, fasting lipids, glucose/diabetes screening, BMI height to weight ratio, reviewed of smoking status, physical activity, and diet.    Goals include blood pressure 120/80 or less, maintaining a healthy lipid/cholesterol profile, preventing diabetes or  keeping diabetes numbers under good control, not smoking or using tobacco products, exercising most days per week or at  least 150 minutes per week of exercise, and eating healthy variety of fruits and vegetables, healthy oils, and avoiding unhealthy food choices like fried food, fast food, high sugar and high cholesterol foods.    Continue routine follow up with cardiology.     Medical care options: I recommend you continue to seek care here first for routine care.  We try really hard to have available appointments Monday through Friday daytime hours for sick visits, acute visits, and physicals.  Urgent care should be used for after hours and weekends for significant issues that cannot wait till the next day.  The emergency department should be used for significant potentially life-threatening emergencies.  The emergency department is expensive, can often have long wait times for less significant concerns, so try to utilize primary care, urgent care, or telemedicine when possible to avoid unnecessary trips to the emergency department.  Virtual visits and telemedicine have been introduced since the pandemic started in 2020, and can be convenient ways to receive medical care.  We offer virtual appointments as well to assist you in a variety of options to seek medical care.   Advanced Directives: I recommend you consider completing a [place] and Living Will.   These documents respect your wishes and help alleviate burdens on your loved ones if you were to become terminally ill or be in a position to need those documents enforced.    You can complete Advanced Directives yourself, have them notarized, then have copies made for our office, for you and for anybody you feel should have them in safe keeping.  Or, you can have an attorney prepare these documents.   If you haven't updated your Last Will and Testament in a while, it may be worthwhile having an attorney prepare these documents together and save on some costs.      Specific Issues: Diabetes-compliant with Farxiga 5 mg daily, Janumet 50/500  milligram twice daily, continue glucose testing, yearly eye doctor visit, daily foot checks  Hyperlipidemia-I reviewed your recent cholesterol labs done by cardiology.  Continue atorvastatin 40 mg daily and aspirin 81 mg daily  Hypertension, history of heart failure-continue current medications, Entresto, Coreg, Farxiga  Vitamin D deficiency-updated labs today.  Currently on 2000 units daily supplement  Coronary artery disease-continue cholesterol medicine and aspirin daily  ABI leg blood flow screening done in 2022.  Echocardiogram reviewed from August 2023.  Referral back to GI as you are due for colonoscopy at this time

## 2021-11-13 NOTE — Progress Notes (Signed)
Subjective:    Darlene Wade is a 66 y.o. female who presents for Preventative Services visit and chronic medical problems/med check visit.    Chief Complaint  Patient presents with   fasting cpe    Fasting cpe, awv, flu shot today     Primary Care Provider Tysinger, Camelia Eng, PA-C here  Patient Care Team: Tysinger, Camelia Eng, PA-C as PCP - General (Family Medicine) Nahser, Wonda Cheng, MD as Consulting Physician (Cardiology) Laurence Aly, OD (Optometry) Dr. Frederik Pear, ortho [facility] Dr. Wilfrid Lund, GI Dr. Donnie Mesa, general surgery   Medical Services you may have received from other than [facility] providers in the past year (date may be approximate) See above   Exercise Current exercise habits:  walking     Nutrition/Diet Current diet: in general, a "healthy" diet     Depression Screen    11/13/2021   10:36 AM  Depression screen PHQ 2/9  Decreased Interest 0  Down, Depressed, Hopeless 0  PHQ - 2 Score 0    Activities of Daily Living Screen/Functional Status Survey    Can patient draw a clock face showing 3:15 oclock, yes  Fall Risk Screen    11/13/2021   10:35 AM 04/24/2021   11:09 AM 12/19/2020    2:25 PM 06/13/2020   11:24 AM 11/17/2019    8:28 AM  [place] in the past year? 1 0 0 1 1  Number falls in past yr: 0 0 0 0 1  Injury with Fall? 0 0 0 0 0  Risk for fall due to : Impaired balance/gait No Fall Risks No Fall Risks No Fall Risks   Follow up Falls evaluation completed Falls evaluation completed Falls evaluation completed      Gait Assessment: Normal gait observed yes  Advanced directives Does patient have a [place]? No Does patient have a Living Will? No  Past Medical History:  Diagnosis Date   Asthma 07/20/2018   one puff per day   Chronic combined systolic and diastolic heart failure ([place]) 03/03/2018   Echo 07/2018: EF 40-45, diff HK worse in Inf base, normal RVSF   Diabetes mellitus without  complication ([place]) 1884   Diabetic eye exam ([place])    [facility]   Dyspnea    Elbow fracture, right 2007   Former smoker    20 pack year history, quit 2010   Hyperlipidemia    Hypertension    Insomnia    Lung nodule    Chest CT 07/2018:  RLL nodule resolved.  3 mm subpleural LUL nodule.  Repeat in 1 year if high risk.    Obesity    PONV (postoperative nausea and vomiting) 2014   1 time   Wears glasses    reading    Past Surgical History:  Procedure Laterality Date   BREAST CYST ASPIRATION  2015   BREAST EXCISIONAL BIOPSY Left    BREAST LUMPECTOMY WITH NEEDLE LOCALIZATION Left 10/07/2012   Procedure: BREAST LUMPECTOMY WITH NEEDLE LOCALIZATION;  Surgeon: Imogene Burn. Georgette Dover, MD;  Location: [place];  Service: General;  Laterality: Left;   BREAST SURGERY     COLONOSCOPY  01/2016   01/2016 with Dr. Loletha Carrow, tubular adenoma polpy; 2003 with Dr. Collene Mares   ELBOW ARTHROPLASTY  2006   rt-fx   FOOT ARTHROTOMY  12/2010   foot fusion, right   FOOT MASS EXCISION  04/2012   left-fusion   HYSTERECTOMY ABDOMINAL  WITH SALPINGECTOMY Bilateral 1985   PARTIAL HYSTERECTOMY  age 3   uterine fibroids, still has ovaries   TONSILLECTOMY     TOTAL KNEE ARTHROPLASTY Left 08/10/2018   Procedure: Left Knee Arthroplasty;  Surgeon: Frederik Pear, MD;  Location: [facility];  Service: Orthopedics;  Laterality: Left;    Social History   Socioeconomic History   Marital status: Married    Spouse name: Not on file   Number of children: Not on file   Years of education: Not on file   Highest education level: Not on file  Occupational History   Not on file  Tobacco Use   Smoking status: Former    Packs/day: 1.00    Years: 20.00    Total pack years: 20.00    Types: Cigarettes    Quit date: 04/11/2009    Years since quitting: 12.6   Smokeless tobacco: Never  Vaping Use   Vaping Use: Never used  Substance and Sexual Activity   Alcohol use: Yes    Alcohol/week: 1.0 standard drink of alcohol     Types: 1 Glasses of wine per week    Comment: rare   Drug use: No   Sexual activity: Not on file  Other Topics Concern   Not on file  Social History Narrative   Separated from husband (2013). Living with husband.  Walking for exercise 2-3 x per week.  Retired, Set designer in the past.  Has 2 grandchildren, teenagers.   Driving school bus.   11/2021   Social Determinants of Health   Financial Resource Strain: Not on file  Food Insecurity: Not on file  Transportation Needs: Not on file  Physical Activity: Not on file  Stress: Not on file  Social Connections: Not on file  Intimate Partner Violence: Not on file    Family History  Problem Relation Age of Onset   Diabetes Mother    Hypertension Mother    Diabetes Maternal Aunt    Heart disease Maternal Grandfather    Cancer Neg Hx    Breast cancer Neg Hx      Current Outpatient Medications:    ACCU-CHEK GUIDE test strip, TEST 1 TO 2 TIMES DAILY, Disp: 100 strip, Rfl: 2   aspirin EC 81 MG tablet, Take 1 tablet (81 mg total) by mouth daily., Disp: 90 tablet, Rfl: 3   atorvastatin (LIPITOR) 40 MG tablet, Take 1 tablet by mouth once daily, Disp: 90 tablet, Rfl: 0   Blood Glucose Monitoring Suppl (ACCU-CHEK AVIVA PLUS) w/Device KIT, Test blood sugar 2 times daily, Disp: 1 kit, Rfl: 0   Blood Glucose Monitoring Suppl (TRUE METRIX METER) DEVI, Test 1-2 times a day, Disp: 100 each, Rfl: 1   carvedilol (COREG) 6.25 MG tablet, Take 1 tablet (6.25 mg total) by mouth 2 (two) times daily., Disp: 180 tablet, Rfl: 3   cholecalciferol (VITAMIN D3) 25 MCG (1000 UNIT) tablet, Take 2 tablets (2,000 Units total) by mouth daily., Disp: 180 tablet, Rfl: 3   dapagliflozin propanediol (FARXIGA) 5 MG TABS tablet, Take 1 tablet (5 mg total) by mouth daily before breakfast., Disp: 90 tablet, Rfl: 1   glucose blood (TRUE METRIX BLOOD GLUCOSE TEST) test strip, Test 1-2 times a day, Disp: 100 each, Rfl: 12   JANUMET 50-500 MG tablet, TAKE 1 TABLET BY  MOUTH TWICE DAILY WITH MEALS, Disp: 30 tablet, Rfl: 4   Multiple Vitamin (MULTIVITAMIN) tablet, Take 1 tablet by mouth daily., Disp: , Rfl:    sacubitril-valsartan (ENTRESTO) 49-51 MG,  Take 1 tablet by mouth 2 (two) times daily., Disp: 60 tablet, Rfl: 11   TRUEplus Lancets 28G MISC, TEST BLOOD SUGAR 1 TO 2 TIMES DAILY, Disp: 200 each, Rfl: 1  Allergies  Allergen Reactions   Jardiance [Empagliflozin]     Yeast infections, tolerates farxiga though   Naproxen Nausea Only    History reviewed: allergies, current medications, past family history, past medical history, past social history, past surgical history and problem list  Chronic issues discussed: Diabetes-blood sugar at goal.  Compliant with Janumet and Farxiga  Hypertension, heart disease-compliant with medication  Hyperlipidemia-compliant with medication   Acute issues discussed: Had recent rash on forearm but it is resolving.     Objective:      Biometrics BP 110/60   Pulse 84   Ht _0  (1.575 m)   Wt 212 lb 12.8 oz (96.5 kg)   BMI 38.92 kg/m   Gen: wdwn, nad Skin:  unremarkable HEENT: normocephalic, sclerae anicteric, TMs pearly, nares patent, no discharge or erythema, pharynx normal Oral cavity: MMM, no lesions, teeth in good repair Neck: supple, no lymphadenopathy, no thyromegaly, no masses Heart: RRR, normal S1, S2, no murmurs Lungs: CTA bilaterally, no wheezes, rhonchi, or rales Abdomen: +bs, soft, non tender, non distended, no masses, no hepatomegaly, no splenomegaly Musculoskeletal: surgical scars dorsal bilat feet , lower legs bilat, right knee, nontender, no swelling, no obvious deformity Extremities: no edema, no cyanosis, no clubbing Pulses: 2+ symmetric, upper and lower extremities, normal cap refill Neurological: alert, oriented x 3, CN2-12 intact, strength normal upper extremities and lower extremities, sensation normal throughout, DTRs 2+ throughout, no cerebellar signs, gait normal Psychiatric:  normal affect, behavior normal, pleasant  Breast/Gu - declined/ deferred    Assessment:   Encounter Diagnoses  Name Primary?   Encounter for health maintenance examination in adult Yes   Needs flu shot    Essential hypertension, benign    Estrogen deficiency    High risk medication use    Hyperlipidemia associated with type 2 diabetes mellitus ([place])    Medicare annual wellness visit, subsequent    Mild intermittent asthma without complication    Vitamin D deficiency    Vaccine counseling    S/P total knee arthroplasty, left    Postmenopausal estrogen deficiency    Morbid obesity ([place])    Advanced directives, counseling/discussion    Aortic atherosclerosis ([place])    Coronary artery disease involving native coronary artery of native heart without angina pectoris    Decreased pedal pulses    Diabetes mellitus with complication ([place])    Screen for colon cancer      Plan:    This visit was a preventative care visit, also known as wellness visit or routine physical.   Topics typically include healthy lifestyle, diet, exercise, preventative care, vaccinations, sick and well care, proper use of emergency dept and after hours care, as well as other concerns.     Recommendations: Continue to return yearly for your annual wellness and preventative care visits.  This gives [place] a chance to discuss healthy lifestyle, exercise, vaccinations, review your chart record, and perform screenings where appropriate.  I recommend you see your eye doctor yearly for routine vision care.  I recommend you see your dentist yearly for routine dental care including hygiene visits twice yearly.   Vaccination recommendations were reviewed Immunization History  Administered Date(s) Administered   Fluad Quad(high Dose 65+) 11/16/2018, 11/17/2019, 11/28/2020, 11/13/2021   Influenza,inj,Quad PF,6+ Mos 09/28/2013, 09/21/2014, 12/19/2015, 12/27/2016, 11/13/2017  PFIZER(Purple Top)SARS-COV-2 Vaccination  03/18/2019, 04/08/2019, 11/13/2019   Pfizer Covid-19 Vaccine Bivalent Booster 52yr & up 11/28/2020   Pneumococcal Conjugate-13 03/03/2018   Pneumococcal Polysaccharide-23 11/13/2011, 06/13/2020   Tdap 11/13/2011    Counseled on the influenza virus vaccine.  Vaccine information sheet given.   High dose Influenza vaccine given after consent obtained.  Get your Shingrix and Tetanus boostes at your pharmacy soon.   Screening for cancer: Colon cancer screening: I reviewed your colonoscopy on file that is up to date from 2017.  Due repeat now  Breast cancer screening: You should perform a self breast exam monthly.   We reviewed recommendations for regular mammograms and breast cancer screening.   Skin cancer screening: Check your skin regularly for new changes, growing lesions, or other lesions of concern Come in for evaluation if you have skin lesions of concern.  Lung cancer screening: If you have a greater than 20 pack year history of tobacco use, then you may qualify for lung cancer screening with a chest CT scan.   Please call your insurance company to inquire about coverage for this test.  We currently don't have screenings for other cancers besides breast, cervical, colon, and lung cancers.  If you have a strong family history of cancer or have other cancer screening concerns, please let me know.    Bone health: Get at least 150 minutes of aerobic exercise weekly Get weight bearing exercise at least once weekly Bone density test:  A bone density test is an imaging test that uses a type of X-ray to measure the amount of calcium and other minerals in your bones. The test may be used to diagnose or screen you for a condition that causes weak or thin bones (osteoporosis), predict your risk for a broken bone (fracture), or determine how well your osteoporosis treatment is working. The bone density test is recommended for females 685and older, or females or males <<79if certain risk  factors such as thyroid disease, long term use of steroids such as for asthma or rheumatological issues, vitamin D deficiency, estrogen deficiency, family history of osteoporosis, self or family history of fragility fracture in first degree relative.  Bone Density test normal 2023.   Heart health: Get at least 150 minutes of aerobic exercise weekly Limit alcohol It is important to maintain a healthy blood pressure and healthy cholesterol numbers  Heart disease screening: Screening for heart disease includes screening for blood pressure, fasting lipids, glucose/diabetes screening, BMI height to weight ratio, reviewed of smoking status, physical activity, and diet.    Goals include blood pressure 120/80 or less, maintaining a healthy lipid/cholesterol profile, preventing diabetes or keeping diabetes numbers under good control, not smoking or using tobacco products, exercising most days per week or at least 150 minutes per week of exercise, and eating healthy variety of fruits and vegetables, healthy oils, and avoiding unhealthy food choices like fried food, fast food, high sugar and high cholesterol foods.    Continue routine follow up with cardiology.     Medical care options: I recommend you continue to seek care here first for routine care.  We try really hard to have available appointments Monday through Friday daytime hours for sick visits, acute visits, and physicals.  Urgent care should be used for after hours and weekends for significant issues that cannot wait till the next day.  The emergency department should be used for significant potentially life-threatening emergencies.  The emergency department is expensive, can often have long wait  times for less significant concerns, so try to utilize primary care, urgent care, or telemedicine when possible to avoid unnecessary trips to the emergency department.  Virtual visits and telemedicine have been introduced since the pandemic started in  2020, and can be convenient ways to receive medical care.  We offer virtual appointments as well to assist you in a variety of options to seek medical care.   Advanced Directives: I recommend you consider completing a [place] and Living Will.   These documents respect your wishes and help alleviate burdens on your loved ones if you were to become terminally ill or be in a position to need those documents enforced.    You can complete Advanced Directives yourself, have them notarized, then have copies made for our office, for you and for anybody you feel should have them in safe keeping.  Or, you can have an attorney prepare these documents.   If you haven't updated your Last Will and Testament in a while, it may be worthwhile having an attorney prepare these documents together and save on some costs.      Specific Issues: Diabetes-compliant with Farxiga 5 mg daily, Janumet 50/500 milligram twice daily, continue glucose testing, yearly eye doctor visit, daily foot checks  Hyperlipidemia-I reviewed your recent cholesterol labs done by cardiology.  Continue atorvastatin 40 mg daily and aspirin 81 mg daily  Hypertension, history of heart failure-continue current medications, Entresto, Coreg, Farxiga  Vitamin D deficiency-updated labs today.  Currently on 2000 units daily supplement  Coronary artery disease-continue cholesterol medicine and aspirin daily  ABI leg blood flow screening done in 2022.  Echocardiogram reviewed from August 2023.  Referral back to GI as you are due for colonoscopy at this time     Jamelyn was seen today for fasting cpe.  Diagnoses and all orders for this visit:  Encounter for health maintenance examination in adult -     CBC -     POCT Urinalysis DIP (Proadvantage Device) -     Hemoglobin A1c -     VITAMIN D 25 Hydroxy (Vit-D Deficiency, Fractures) -     Microalbumin/Creatinine Ratio, Urine  Needs flu shot -     Flu Vaccine QUAD High  Dose(Fluad)  Essential hypertension, benign  Estrogen deficiency  High risk medication use  Hyperlipidemia associated with type 2 diabetes mellitus ([place])  Medicare annual wellness visit, subsequent  Mild intermittent asthma without complication  Vitamin D deficiency -     VITAMIN D 25 Hydroxy (Vit-D Deficiency, Fractures)  Vaccine counseling  S/P total knee arthroplasty, left  Postmenopausal estrogen deficiency  Morbid obesity ([place])  Advanced directives, counseling/discussion  Aortic atherosclerosis ([facility])  Coronary artery disease involving native coronary artery of native heart without angina pectoris  Decreased pedal pulses  Diabetes mellitus with complication ([facility]) -     Hemoglobin A1c -     Microalbumin/Creatinine Ratio, Urine  Screen for colon cancer -     Ambulatory referral to Gastroenterology    Medicare Attestation A preventative services visit was completed today.  During the course of the visit the patient was educated and counseled about appropriate screening and preventive services.  A health risk assessment was established with the patient that included a review of current medications, allergies, social history, family history, medical and preventative health history, biometrics, and preventative screenings to identify potential safety concerns or impairments.  A personalized plan was printed today for the patient's records and use.   Personalized health advice  and education was given today to reduce health risks and promote self management and wellness.  Information regarding end of life planning was discussed today.  Dorothea Ogle, PA-C   11/13/2021

## 2021-11-14 ENCOUNTER — Other Ambulatory Visit: Payer: Self-pay | Admitting: Medical

## 2021-11-14 LAB — HEMOGLOBIN A1C
Est. average glucose Bld gHb Est-mCnc: 146 mg/dL
Hgb A1c MFr Bld: 6.7 % — ABNORMAL HIGH (ref 4.8–5.6)

## 2021-11-14 LAB — CBC
Hematocrit: 39 % (ref 34.0–46.6)
Hemoglobin: 12.1 g/dL (ref 11.1–15.9)
MCH: 27.5 pg (ref 26.6–33.0)
MCHC: 31 g/dL — ABNORMAL LOW (ref 31.5–35.7)
MCV: 89 fL (ref 79–97)
Platelets: 249 10*3/uL (ref 150–450)
RBC: 4.4 x10E6/uL (ref 3.77–5.28)
RDW: 14.3 % (ref 11.7–15.4)
WBC: 6 10*3/uL (ref 3.4–10.8)

## 2021-11-14 LAB — MICROALBUMIN / CREATININE URINE RATIO
Creatinine, Urine: 153.8 mg/dL
Microalb/Creat Ratio: 4 mg/g creat (ref 0–29)
Microalbumin, Urine: 6 ug/mL

## 2021-11-14 LAB — VITAMIN D 25 HYDROXY (VIT D DEFICIENCY, FRACTURES): Vit D, 25-Hydroxy: 27.9 ng/mL — ABNORMAL LOW (ref 30.0–100.0)

## 2021-11-14 MED ORDER — TRUEPLUS LANCETS 28G MISC: 1 refills | Status: DC

## 2021-11-14 MED ORDER — VITAMIN D 50 MCG (2000 UT) PO CAPS: 1.0000 | ORAL_CAPSULE | Freq: Every day | ORAL | 3 refills | Status: DC

## 2021-11-14 MED ORDER — DAPAGLIFLOZIN PROPANEDIOL 5 MG PO TABS: 5.0000 mg | ORAL_TABLET | Freq: Every day | ORAL | 1 refills | Status: DC

## 2021-11-14 MED ORDER — JANUMET 50-500 MG PO TABS: 1.0000 | ORAL_TABLET | Freq: Two times a day (BID) | ORAL | 0 refills | Status: DC

## 2021-11-20 ENCOUNTER — Encounter: Payer: Self-pay | Admitting: Internal Medicine

## 2021-12-14 ENCOUNTER — Ambulatory Visit
Admission: RE | Admit: 2021-12-14 | Discharge: 2021-12-14 | Disposition: A | Discharge status: Home / Self Care | Payer: Medicare HMO | Source: Ambulatory Visit

## 2021-12-14 DIAGNOSIS — Z1231 Encounter for screening mammogram for malignant neoplasm of breast: Secondary | ICD-10-CM | POA: Diagnosis not present

## 2021-12-19 ENCOUNTER — Encounter: Payer: Self-pay | Admitting: Cardiovascular Disease

## 2022-01-01 NOTE — Telephone Encounter (Signed)
Left message samples Farxiga up front

## 2022-01-11 ENCOUNTER — Other Ambulatory Visit: Payer: Self-pay | Admitting: Cardiovascular Disease

## 2022-02-11 ENCOUNTER — Other Ambulatory Visit: Payer: Self-pay | Admitting: Medical

## 2022-03-20 ENCOUNTER — Other Ambulatory Visit: Payer: Self-pay | Admitting: Medical

## 2022-03-20 NOTE — Telephone Encounter (Signed)
Pt was given 6 month supply on 11/2021

## 2022-03-25 ENCOUNTER — Other Ambulatory Visit: Payer: Self-pay | Admitting: Medical

## 2022-05-02 ENCOUNTER — Ambulatory Visit (INDEPENDENT_AMBULATORY_CARE_PROVIDER_SITE_OTHER): Payer: Medicare Other | Admitting: Medical

## 2022-05-02 VITALS — BP 120/68 | HR 60 | Wt 209.6 lb

## 2022-05-02 DIAGNOSIS — I5022 Chronic systolic (congestive) heart failure: Secondary | ICD-10-CM | POA: Diagnosis not present

## 2022-05-02 DIAGNOSIS — E785 Hyperlipidemia, unspecified: Secondary | ICD-10-CM | POA: Diagnosis not present

## 2022-05-02 DIAGNOSIS — E1169 Type 2 diabetes mellitus with other specified complication: Secondary | ICD-10-CM | POA: Diagnosis not present

## 2022-05-02 DIAGNOSIS — I700 Atherosclerosis of aorta: Secondary | ICD-10-CM | POA: Diagnosis not present

## 2022-05-02 DIAGNOSIS — I10 Essential (primary) hypertension: Secondary | ICD-10-CM

## 2022-05-02 DIAGNOSIS — E118 Type 2 diabetes mellitus with unspecified complications: Secondary | ICD-10-CM

## 2022-05-02 DIAGNOSIS — E559 Vitamin D deficiency, unspecified: Secondary | ICD-10-CM | POA: Diagnosis not present

## 2022-05-02 DIAGNOSIS — Z7185 Encounter for immunization safety counseling: Secondary | ICD-10-CM

## 2022-05-02 NOTE — Progress Notes (Signed)
Subjective: Chief Complaint  Patient presents with   diabetes check    Diabetes check- no other concerns   Here for med check  Diabetes-compliant with [place] and [place].  Blood sugars have been running normal.  No concerns, feeling okay  Vitamin D deficiency -compliant with supplement  Hypertension, chronic heart failure-she last saw cardiology back in the fall, had echocardiogram.  Compliant with medications  She did speak with gastro recently, she has colonoscopy planned for December 2024  Otherwise been doing fine without complaint  Exercising some   Past Medical History:  Diagnosis Date   Asthma 07/20/2018   one puff per day   Chronic combined systolic and diastolic heart failure ([place]) 03/03/2018   Echo 07/2018: EF 40-45, diff HK worse in Inf base, normal RVSF   Diabetes mellitus without complication ([place]) 0000000   Diabetic eye exam ([place])    [facility]   Dyspnea    Elbow fracture, right 2007   Former smoker    20 pack year history, quit 2010   Hyperlipidemia    Hypertension    Insomnia    Lung nodule    Chest CT 07/2018:  RLL nodule resolved.  3 mm subpleural LUL nodule.  Repeat in 1 year if high risk.    Obesity    PONV (postoperative nausea and vomiting) 2014   1 time   Wears glasses    reading   Current Outpatient Medications on File Prior to Visit  Medication Sig Dispense Refill   ACCU-CHEK GUIDE test strip TEST 1 TO 2 TIMES DAILY 100 strip 2   aspirin EC 81 MG tablet Take 1 tablet (81 mg total) by mouth daily. 90 tablet 3   atorvastatin (LIPITOR) 40 MG tablet Take 1 tablet by mouth once daily 90 tablet 1   carvedilol (COREG) 6.25 MG tablet Take 1 tablet (6.25 mg total) by mouth 2 (two) times daily. 180 tablet 3   Cholecalciferol (VITAMIN D) 50 MCG (2000 UT) CAPS Take 1 capsule (2,000 Units total) by mouth daily. 90 capsule 3   dapagliflozin propanediol (FARXIGA) 5 MG TABS tablet TAKE 1 TABLET BY MOUTH ONCE DAILY BEFORE BREAKFAST 90 tablet 0   Multiple  Vitamin (MULTIVITAMIN) tablet Take 1 tablet by mouth daily.     sacubitril-valsartan (ENTRESTO) 49-51 MG Take 1 tablet by mouth twice daily 60 tablet 11   sitaGLIPtin-metformin (JANUMET) 50-500 MG tablet Take 1 tablet by mouth 2 (two) times daily with a meal. 180 tablet 0   spironolactone (ALDACTONE) 25 MG tablet Take 25 mg by mouth daily.     Blood Glucose Monitoring Suppl (ACCU-CHEK AVIVA PLUS) w/Device KIT Test blood sugar 2 times daily 1 kit 0   Blood Glucose Monitoring Suppl (TRUE METRIX METER) DEVI Test 1-2 times a day 100 each 1   glucose blood (TRUE METRIX BLOOD GLUCOSE TEST) test strip Test 1-2 times a day 100 each 12   TRUEplus Lancets 28G MISC TEST BLOOD SUGAR 1 TO 2 TIMES DAILY 200 each 1   No current facility-administered medications on file prior to visit.   ROS as in subjective    Objective: BP 120/68   Pulse 60   Wt 209 lb 9.6 oz (95.1 kg)   BMI 38.34 kg/m   Gen: wd, wn, nad Lungs clear No edema of LE Heart rrr, normal s1, 2, no murmurs   Diabetic Foot Exam - Simple   Simple Foot Form Diabetic Foot exam was performed with the following findings: Yes 05/02/2022 11:01 AM  Visual Inspection See comments: Yes Sensation Testing Intact to touch and monofilament testing bilaterally: Yes Pulse Check See comments: Yes Comments Several bilat dorsal surgical scars from prior foot ortho surgeries, flat feet, otherwise no lesions 1+ pulses      Assessment: Encounter Diagnoses  Name Primary?   Diabetes mellitus with complication ([place]) Yes   Essential hypertension, benign    Hyperlipidemia associated with type 2 diabetes mellitus ([place])    Chronic systolic CHF (congestive heart failure) ([place])    Aortic atherosclerosis ([facility])    Vitamin D deficiency    Vaccine counseling     Plan: Diabetes  Lab today Continue Farxiga 5mg , Janumet 50/500mg  BID Continue glucose monitoring  Chronic CHF, HTN  I reviewed her 8/23 echo, EF 45-50% Continue current  medication Continue carvedilol 6.25mg  BID, spironolactone 25mg  daily, entresto 49/51mg  daily, farxiga 5mg  daily  Hyperlipidemia , aortic atherosclerosis Continue lipitor 40mg  daily , aspirin 81mg  daily   Due for repeat colonoscopy 01/2023.   She is aware  Vit d deficiency - updated labs today, continue supplement  Vaccine counseling - get you tdap and shingrix at your pharmacy   F/u pending labs

## 2022-05-02 NOTE — Patient Instructions (Signed)
Get your shingles vaccine and updated tetanus vaccine at your pharmacy

## 2022-05-03 LAB — HEMOGLOBIN A1C
Est. average glucose Bld gHb Est-mCnc: 148 mg/dL
Hgb A1c MFr Bld: 6.8 % — ABNORMAL HIGH (ref 4.8–5.6)

## 2022-05-03 LAB — VITAMIN D 25 HYDROXY (VIT D DEFICIENCY, FRACTURES): Vit D, 25-Hydroxy: 35 ng/mL (ref 30.0–100.0)

## 2022-05-14 ENCOUNTER — Other Ambulatory Visit: Payer: Self-pay | Admitting: Medical

## 2022-05-14 MED ORDER — DAPAGLIFLOZIN PROPANEDIOL 5 MG PO TABS: 5.0000 mg | ORAL_TABLET | Freq: Every day | ORAL | 1 refills | Status: DC

## 2022-05-14 MED ORDER — JANUMET 50-500 MG PO TABS: 1.0000 | ORAL_TABLET | Freq: Two times a day (BID) | ORAL | 1 refills | Status: DC

## 2022-05-21 ENCOUNTER — Encounter: Payer: Self-pay | Admitting: Medical

## 2022-05-21 ENCOUNTER — Ambulatory Visit (INDEPENDENT_AMBULATORY_CARE_PROVIDER_SITE_OTHER): Payer: Medicare Other | Admitting: Medical

## 2022-05-21 VITALS — BP 128/74 | HR 80 | Ht 62.0 in | Wt 211.0 lb

## 2022-05-21 DIAGNOSIS — M542 Cervicalgia: Secondary | ICD-10-CM | POA: Diagnosis not present

## 2022-05-21 DIAGNOSIS — R29898 Other symptoms and signs involving the musculoskeletal system: Secondary | ICD-10-CM

## 2022-05-21 DIAGNOSIS — M25512 Pain in left shoulder: Secondary | ICD-10-CM

## 2022-05-21 DIAGNOSIS — M549 Dorsalgia, unspecified: Secondary | ICD-10-CM

## 2022-05-21 MED ORDER — TIZANIDINE HCL 4 MG PO TABS: 4.0000 mg | ORAL_TABLET | Freq: Two times a day (BID) | ORAL | 1 refills | Status: DC | PRN

## 2022-05-21 MED ORDER — IBUPROFEN 600 MG PO TABS: 600.0000 mg | ORAL_TABLET | Freq: Two times a day (BID) | ORAL | 0 refills | Status: DC | PRN

## 2022-05-21 MED ORDER — HYDROCODONE-ACETAMINOPHEN 5-325 MG PO TABS: 1.0000 | ORAL_TABLET | Freq: Four times a day (QID) | ORAL | 0 refills | Status: DC | PRN

## 2022-05-21 NOTE — Progress Notes (Signed)
Subjective:  Darlene Wade is a 67 y.o. female who presents for Chief Complaint  Patient presents with   Neck Pain    Neck and left shoulder pain x 2 weeks-aches like a toothache. Left thumb pain that started yesterday.      Here for neck and left shoulder pain x 2 weeks.   Achy all the time.   No fall, no injury, no trauma.  No recent strenuous activity or change in activity.  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  She does have some decreased range of motion of the neck to the left.  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  She had a prior x-ray through orthopedics.  She sees Dr. Turner Daniels at [facility] orthopedics for her knee primarily but had an x-ray of her shoulder in the past year or so.  Currently no right shoulder pain.  Unrelated she gets some left thumb pain, thinks is arthritis.  It aches from time to time.  She also gets some pain and locking of her left middle finger but not real frequent.  The left thumb hurts in the medial part of the thumb.  Using some Tylenol arthritis and ibuprofen some in recent days to help with the neck and shoulder pain.  No other aggravating or relieving factors.    No other c/o.   The following portions of the patient's history were reviewed and updated as appropriate: allergies, current medications, past family history, past medical history, past social history, past surgical history and problem list.  ROS Otherwise as in subjective above    Objective: BP 128/74   Pulse 80   Ht 5\' 2"  (1.575 m)   Wt 211 lb (95.7 kg)   BMI 38.59 kg/m   General appearance: alert, no distress, well developed, well nourished Neck with no obvious tenderness but she does have decreased range of motion with left rotation to about 50 degrees, decreased range of motion with neck extension.  No mass or thyromegaly.  No other abnormality of neck, no midline tenderness Left upper  back tender and positive spasm in the paraspinal region.  No tenderness of the scapula or lower or mid back. Tender over the left AC joint and biceps origin but otherwise arm nontender, range of motion seems relatively full but she does have some pain with crossover test and range of motion of internal/external range of motion is a little decreased.  Negative empty can test.  Tender over left thumb at MCP and base of thumb normal range of motion without swelling or deformity.  No trigger action of the thumb. Arms neurovascularly intact    Assessment: Encounter Diagnoses  Name Primary?   Decreased range of motion of neck Yes   Neck pain    Upper back pain    Acute pain of left shoulder      Plan: We discussed symptoms and concerns suggesting muscle spasm, acute inflammation.  Cannot rule out arthritis of left shoulder since she has significant arthritis in her right shoulder but currently this is a short-term issue that is started in the last few weeks.  We discussed the recommendations below for the neck and shoulder.  She also has some pain in her left thumb likely due to arthritis.  We discussed use of thumb spica splint periodically.  She also has some symptoms of trigger finger although that was not appreciated on exam  today.  If this continues to be a problem we can get her back in with orthopedics  Patient Instructions  Your neck has some tension and spasm as well as the upper back and shoulder.  Your symptoms suggest muscle inflammation and spasm.  It would not be unusual to see some arthritis in the left shoulder since she had some in the right shoulder but I do not think you necessarily need to get an x-ray at this time.  Recommendations: Begin ibuprofen 600 mg twice daily for the next 5 days, then use this as needed I prescribed hydrocodone pain medicine that you can use as needed.  This is for worse pain you can use 2 or possibly 3 times a day for really bad pain.  This can cause  constipation or sleepiness so be careful with this. I am prescribing muscle laxer tizanidine today.  You can use this either at bedtime or if you are going to be home for several hours you could take it as much as twice daily for muscle spasm and tension.  This can make you sleepy as well. I would recommend doing some alternating ice and heat to the neck and shoulder.  For example you can do heat for 20 minutes followed by cold pack for 20 minutes Stretch daily If you do not see improvements in the neck range of motion and upper back later this week, 1 other option would be to do some physical therapy to help with spasm in the neck and shoulder. If this continues to be a problem, we may need to have you follow up with orthopedics     Graziella was seen today for neck pain.  Diagnoses and all orders for this visit:  Decreased range of motion of neck  Neck pain  Upper back pain  Acute pain of left shoulder  Other orders -     tiZANidine (ZANAFLEX) 4 MG tablet; Take 1 tablet (4 mg total) by mouth 2 (two) times daily as needed for muscle spasms. -     HYDROcodone-acetaminophen (NORCO) 5-325 MG tablet; Take 1 tablet by mouth every 6 (six) hours as needed. -     ibuprofen (ADVIL) 600 MG tablet; Take 1 tablet (600 mg total) by mouth 2 (two) times daily as needed.    Follow up: As needed

## 2022-05-21 NOTE — Patient Instructions (Signed)
Your neck has some tension and spasm as well as the upper back and shoulder.  Your symptoms suggest muscle inflammation and spasm.  It would not be unusual to see some arthritis in the left shoulder since she had some in the right shoulder but I do not think you necessarily need to get an x-ray at this time.  Recommendations: Begin ibuprofen 600 mg twice daily for the next 5 days, then use this as needed I prescribed hydrocodone pain medicine that you can use as needed.  This is for worse pain you can use 2 or possibly 3 times a day for really bad pain.  This can cause constipation or sleepiness so be careful with this. I am prescribing muscle laxer tizanidine today.  You can use this either at bedtime or if you are going to be home for several hours you could take it as much as twice daily for muscle spasm and tension.  This can make you sleepy as well. I would recommend doing some alternating ice and heat to the neck and shoulder.  For example you can do heat for 20 minutes followed by cold pack for 20 minutes Stretch daily If you do not see improvements in the neck range of motion and upper back later this week, 1 other option would be to do some physical therapy to help with spasm in the neck and shoulder. If this continues to be a problem, we may need to have you follow up with orthopedics

## 2022-05-31 ENCOUNTER — Telehealth: Payer: Self-pay | Admitting: Internal Medicine

## 2022-05-31 DIAGNOSIS — Z79899 Other long term (current) drug therapy: Secondary | ICD-10-CM

## 2022-05-31 DIAGNOSIS — E118 Type 2 diabetes mellitus with unspecified complications: Secondary | ICD-10-CM

## 2022-05-31 NOTE — Telephone Encounter (Signed)
-----   Message from Sherrill Raring, [facility] sent at 05/30/2022  2:31 PM EDT ----- Regarding: 2300-Care Coordation Pharmacy Referral Hello,  Pt is needing a 2300-Care Coordination Pharmacy Referral placed for diabetes management/med adherence, per PCP/Diana.  Thank you,  Sherrill Raring Clinical Pharmacist [phone]

## 2022-05-31 NOTE — Telephone Encounter (Signed)
done

## 2022-06-03 ENCOUNTER — Telehealth: Payer: Self-pay | Admitting: Medical

## 2022-06-03 ENCOUNTER — Other Ambulatory Visit: Payer: Self-pay | Admitting: Medical

## 2022-06-03 DIAGNOSIS — G8929 Other chronic pain: Secondary | ICD-10-CM

## 2022-06-03 NOTE — Telephone Encounter (Signed)
Pt called back to let you know the pain in shoulder and neck has not gotten any better and she would like to be referred to [facility].

## 2022-06-11 ENCOUNTER — Telehealth: Payer: Self-pay | Admitting: Internal Medicine

## 2022-06-11 DIAGNOSIS — E118 Type 2 diabetes mellitus with unspecified complications: Secondary | ICD-10-CM

## 2022-06-11 DIAGNOSIS — Z79899 Other long term (current) drug therapy: Secondary | ICD-10-CM

## 2022-06-11 NOTE — Telephone Encounter (Signed)
-----   Message from Sherrill Raring, [facility] sent at 06/11/2022  7:57 AM EDT ----- Regarding: FW: 2300-Care Coordation Pharmacy Referral Hi Shenica Holzheimer!  I was notified that the referral below was placed but the referral reason selected was "Care Coordination ACO" when it needs to be "Pharmacy."  When you have a moment, could you please place the order this way so my team can schedule? Sorry for the confusion!  Thank you again, Sherrill Raring Clinical Pharmacist [phone] ----- Message ----- From: Sherrill Raring, [facility] Sent: 05/30/2022   2:31 PM EDT To: Britt Boozer, CMA Subject: 2300-Care Coordation Pharmacy Referral         Hello,  Pt is needing a 2300-Care Coordination Pharmacy Referral placed for diabetes management/med adherence, per PCP/Diana.  Thank you,  Sherrill Raring Clinical Pharmacist [phone]

## 2022-06-11 NOTE — Telephone Encounter (Signed)
done

## 2022-06-12 ENCOUNTER — Ambulatory Visit: Payer: Self-pay | Admitting: Orthopaedic Surgery

## 2022-06-13 DIAGNOSIS — M542 Cervicalgia: Secondary | ICD-10-CM | POA: Diagnosis not present

## 2022-06-13 DIAGNOSIS — S161XXA Strain of muscle, fascia and tendon at neck level, initial encounter: Secondary | ICD-10-CM | POA: Diagnosis not present

## 2022-06-13 DIAGNOSIS — S43402A Unspecified sprain of left shoulder joint, initial encounter: Secondary | ICD-10-CM | POA: Diagnosis not present

## 2022-06-13 DIAGNOSIS — M25512 Pain in left shoulder: Secondary | ICD-10-CM | POA: Diagnosis not present

## 2022-06-14 ENCOUNTER — Telehealth: Payer: Self-pay

## 2022-06-14 NOTE — Progress Notes (Unsigned)
Patient ID: Darlene Wade, female   DOB: 1955-07-31, 67 y.o.   MRN: [id number]  Care Management & Coordination Services Pharmacy Team  Reason for Encounter: Chart prep for initial encounter with Delano Metz Clinical Pharmacist on 06/20/22 at 11 am via phone Spoke with patient on 06/17/2022   Have you seen any other providers since your last visit? Patient reports none  Any changes in your medications or health? Patient reports no  Any side effects from any medications? Patient reports she has an occasional yeast infection with her Marcelline Deist, was taking in the past Jardiance and they were worse.  Do you have an symptoms or problems not managed by your medications? Patient reports none  Any concerns about your health right now? Patient reports she is concerned with what is going on currently with her health but has been following orthopedist on this issue.  Has your provider asked that you check blood pressure, blood sugar, or follow special diet at home? Patient repots she has a blood pressure cuff and glucometer and is checking both but not often.  Do you get any type of exercise on a regular basis? Patient reports she is out of work currently but when working and when not is walking and doing her yard work panting flowers  Can you think of a goal you would like to reach for your health? Patient reports not at this time  Do you have any problems getting your medications? Patient reports her Sherryll Burger and Marcelline Deist are expensive especially the [facility] when she is in the donut hole she has had to only get half of the prescription filled as she can not afford it. Patient reports she was given a coupon from the Dr and she can not use it as she does not have medicare and medicaid and she has in the past applied for patient assistance with these medications and did not qualify.  Is there anything that you would like to discuss during the appointment? Patient reports none  Patient aware to have  available bring blood pressure cuff and medications for the call.  Chart review:  Recent office visits:  05/21/22 Tysinger, Kermit Balo, PA-C - Patient presented for decreased range of motion of neck and other concerns. Prescribed Hydrocodone- Acetaminophen. Prescribed Tizanidine. Increased Ibuprofen.    05/02/22 Tysinger, Kermit Balo, PA-C - Patient presented for diabetes mellitus with complication and other concerns.   Recent consult visits:  None  Hospital visits:  None in previous 6 months   Fill History  : ATORVASTATIN 40MG    TAB 05/19/2022 90   CARVEDILOL 6.25MG    TAB 12/27/2021 90   FARXIGA 5MG  TAB 03/26/2022 90   HYDROCOD/ACETAM 5-325MG  TAB 05/21/2022 3   IBUPROFEN 600MG      TAB 05/21/2022 15   TRUEplus Lancets 28 gauge 03/12/2021 90   ENTRESTO 49-51MG     TAB 05/19/2022 15   JANUMET 50-500MG     TAB 05/19/2022 15   SPIRONOLACTONE 25MG     TAB 03/20/2022 90   TIZANIDINE HCL  2 MG TABS 06/13/2022 15    Star Rating Drugs:  Atorvastatin 40 mg -  Last filled 05/19/22 90 DS at [facility] Farxiga 5 mg - Last filled 03/26/22 90 at [facility] Janumet 50 -500 mg - Last filled 05/19/22 15 DS at [facility]    Care Gaps: COVID Vaccine - Overdue Zoster Vaccine - Postponed    Pamala Duffel CMA Clinical Pharmacist Assistant [phone]

## 2022-06-18 DIAGNOSIS — M25512 Pain in left shoulder: Secondary | ICD-10-CM | POA: Diagnosis not present

## 2022-06-18 DIAGNOSIS — M79602 Pain in left arm: Secondary | ICD-10-CM | POA: Diagnosis not present

## 2022-06-18 DIAGNOSIS — S161XXS Strain of muscle, fascia and tendon at neck level, sequela: Secondary | ICD-10-CM | POA: Diagnosis not present

## 2022-06-19 ENCOUNTER — Telehealth: Payer: Self-pay

## 2022-06-19 NOTE — Progress Notes (Signed)
Patient ID: Darlene Wade, female   DOB: 09-15-1955, 67 y.o.   MRN: [id number]  Care Management & Coordination Services Pharmacy Team  Reason for Encounter: Appointment Reminder  Contacted patient to confirm telephone appointment with Milas Kocher, PharmD on 06/20/22 at 11. Unsuccessful outreach. Left voicemail for patient to return call.     Star Rating Drugs:  Atorvastatin 40 mg -  Last filled 05/19/22 90 DS at [facility] 5 mg - Last filled 03/26/22 90 at [facility] Janumet 50 -500 mg - Last filled 05/19/22 15 DS at [facility]       Care Gaps: COVID Vaccine - Overdue Zoster Vaccine - Postponed       Pamala Duffel CMA Clinical Pharmacist Assistant [phone]

## 2022-06-19 NOTE — Progress Notes (Unsigned)
Care Management & Coordination Services Pharmacy Note  06/19/2022 Name:  Darlene Wade MRN:  [id number] DOB:  1956-01-14  Summary: BP at goal <130/80 A1C at goal <7 Has been taking Janumet 50-500mg  once daily for months due to stomach upset  Recommendations/Changes made from today's visit: -Counseled to check BP once weekly at home and keep a log -Counseled to check BG once daily and keep a log -Request rx for new meter from PCP at patient request -ORDER rx for Janumet reflecting once daily dosing, with PCP approval  Follow up plan: -DM call in 3 months -Pharmacist visit in 7 months   Subjective: Darlene Wade is an 67 y.o. year old female who is a primary patient of Jac Canavan, PA-C.  The care coordination team was consulted for assistance with disease management and care coordination needs.    Engaged with patient by telephone for initial visit.  Recent office visits: 05/21/22 Tysinger, Kermit Balo, PA-C - Patient presented for decreased range of motion of neck and other concerns. Prescribed Hydrocodone- Acetaminophen. Prescribed Tizanidine. Increased Ibuprofen.     05/02/22 Tysinger, Kermit Balo, PA-C - Patient presented for diabetes mellitus with complication and other concerns.   Recent consult visits: None  Hospital visits: None in previous 6 months   Objective:  Lab Results  Component Value Date   CREATININE 0.86 09/28/2021   BUN 19 09/28/2021   EGFR 74 09/28/2021   GFRNONAA 76 11/17/2019   GFRAA 88 11/17/2019   NA 140 09/28/2021   K 4.3 09/28/2021   CALCIUM 10.0 09/28/2021   CO2 23 09/28/2021   GLUCOSE 122 (H) 09/28/2021    Lab Results  Component Value Date/Time   HGBA1C 6.8 (H) 05/02/2022 10:59 AM   HGBA1C 6.7 (H) 11/13/2021 11:36 AM   MICROALBUR 1.0 05/22/2016 10:14 AM   MICROALBUR 0.8 09/21/2014 12:01 AM    Last diabetic Eye exam:  Lab Results  Component Value Date/Time   HMDIABEYEEXA No Retinopathy 10/18/2021 12:00 AM    Last diabetic Foot  exam: No results found for: "HMDIABFOOTEX"   Lab Results  Component Value Date   CHOL 128 09/28/2021   HDL 63 09/28/2021   LDLCALC 50 09/28/2021   TRIG 74 09/28/2021   CHOLHDL 2.0 09/28/2021       Latest Ref Rng & Units 09/28/2021    9:16 AM 12/19/2020    3:27 PM 11/17/2019    9:12 AM  Hepatic Function  Total Protein 6.0 - 8.5 g/dL  7.6  7.5   Albumin 3.8 - 4.8 g/dL  4.2  4.3   AST 0 - 40 IU/L  12  17   ALT 0 - 32 IU/L 10  11  13    Alk Phosphatase 44 - 121 IU/L  105  92   Total Bilirubin 0.0 - 1.2 mg/dL  0.4  0.2     Lab Results  Component Value Date/Time   TSH 1.450 11/16/2018 09:03 AM   TSH 0.986 09/28/2013 09:53 AM       Latest Ref Rng & Units 11/13/2021   11:36 AM 12/19/2020    3:27 PM 06/26/2020   10:31 AM  CBC  WBC 3.4 - 10.8 x10E3/uL 6.0  9.3  7.9   Hemoglobin 11.1 - 15.9 g/dL 40.9  81.1  91.4   Hematocrit 34.0 - 46.6 % 39.0  37.8  36.5   Platelets 150 - 450 x10E3/uL 249  267  243     Lab Results  Component Value Date/Time  VD25OH 35.0 05/02/2022 10:59 AM   VD25OH 27.9 (L) 11/13/2021 11:36 AM    Clinical ASCVD: Yes  The ASCVD Risk score (Arnett DK, et al., 2019) failed to calculate for the following reasons:   The valid total cholesterol range is 130 to 320 mg/dL    DEXA: 4/0/98 showed normal BMD     05/02/2022   10:29 AM 11/13/2021   10:36 AM 04/24/2021   11:09 AM  Depression screen PHQ 2/9  Decreased Interest 0 0 0  Down, Depressed, Hopeless 0 0 0  PHQ - 2 Score 0 0 0     Social History   Tobacco Use  Smoking Status Former   Packs/day: 1.00   Years: 20.00   Additional pack years: 0.00   Total pack years: 20.00   Types: Cigarettes   Quit date: 04/11/2009   Years since quitting: 13.1  Smokeless Tobacco Never   BP Readings from Last 3 Encounters:  05/21/22 128/74  05/02/22 120/68  11/13/21 110/60   Pulse Readings from Last 3 Encounters:  05/21/22 80  05/02/22 60  11/13/21 84   Wt Readings from Last 3 Encounters:  05/21/22 211 lb  (95.7 kg)  05/02/22 209 lb 9.6 oz (95.1 kg)  11/13/21 212 lb 12.8 oz (96.5 kg)   BMI Readings from Last 3 Encounters:  05/21/22 38.59 kg/m  05/02/22 38.34 kg/m  11/13/21 38.92 kg/m    Allergies  Allergen Reactions   Jardiance [Empagliflozin]     Yeast infections, tolerates farxiga though   Naproxen Nausea Only    Medications Reviewed Today     Reviewed by Melonie Florida, RMA (Registered Medical Assistant) on 05/21/22 at 1212  Med List Status: <None>   Medication Order Taking? Sig Documenting Provider Last Dose Status Informant  ACCU-CHEK GUIDE test strip [id number] Yes TEST 1 TO 2 TIMES DAILY Tysinger, Kermit Balo, PA-C Taking Active   aspirin EC 81 MG tablet [id number] Yes Take 1 tablet (81 mg total) by mouth daily. Tysinger, Kermit Balo, PA-C Taking Active   atorvastatin (LIPITOR) 40 MG tablet [id number] Yes Take 1 tablet by mouth once daily Tysinger, Kermit Balo, PA-C Taking Active   Blood Glucose Monitoring Suppl (ACCU-CHEK AVIVA PLUS) w/Device KIT [id number] Yes Test blood sugar 2 times daily Jac Canavan, PA-C Taking Active   Blood Glucose Monitoring Suppl (TRUE Izola Price) DEVI [id number] Yes Test 1-2 times a day Jac Canavan, PA-C Taking Active   carvedilol (COREG) 6.25 MG tablet [id number] Yes Take 1 tablet (6.25 mg total) by mouth 2 (two) times daily. Nahser, Deloris Ping, MD Taking Active   Cholecalciferol (VITAMIN D) 50 MCG (2000 UT) CAPS [id number] Yes Take 1 capsule (2,000 Units total) by mouth daily. Tysinger, Kermit Balo, PA-C Taking Active   dapagliflozin propanediol (FARXIGA) 5 MG TABS tablet [id number] Yes Take 1 tablet (5 mg total) by mouth daily before breakfast. Tysinger, Kermit Balo, PA-C Taking Active   glucose blood (TRUE METRIX BLOOD GLUCOSE TEST) test strip [id number] Yes Test 1-2 times a day Jac Canavan, PA-C Taking Active   Multiple Vitamin (MULTIVITAMIN) tablet [id number] Yes Take 1 tablet by mouth daily. [provider] Taking Active Self   sacubitril-valsartan (ENTRESTO) 49-51 MG [id number] Yes Take 1 tablet by mouth twice daily Nahser, Deloris Ping, MD Taking Active   sitaGLIPtin-metformin (JANUMET) 50-500 MG tablet [id number] Yes Take 1 tablet by mouth 2 (two) times daily with a meal. Tysinger, Kermit Balo, PA-C Taking Active   spironolactone (ALDACTONE) 25 MG tablet [id number] Yes  Take 25 mg by mouth daily. [provider] Taking Active   TRUEplus Lancets 28G MISC [id number] Yes TEST BLOOD SUGAR 1 TO 2 TIMES DAILY Tysinger, Kermit Balo, PA-C Taking Active             SDOH:  (Social Determinants of Health) assessments and interventions performed: Yes   Medication Assistance: Farxiga and Janumet obtained through [employer] assistance  Medication Access: Within the past 30 days, how often has patient missed a dose of medication? None Is a pillbox or other method used to improve adherence? Yes  Factors that may affect medication adherence? financial need Are meds synced by current pharmacy? No  Are meds delivered by current pharmacy? No  Does patient experience delays in picking up medications due to transportation concerns? No   [facility] Services Reviewed: Is patient disadvantaged to use [facility]?: Yes  Current Rx insurance plan: [facility] Name and location of Current pharmacy:  [facility] - [place], [place] - [address] [place] [address] Phone: [phone] Fax: [phone]  [facility] services reviewed with patient today?: No  Patient requests to transfer care to [facility]?: No  Reason patient declined to change pharmacies: Disadvantaged due to insurance/mail order  Compliance/Adherence/Medication fill history: Care Gaps: COVID Vaccine - Overdue Zoster Vaccine - Postponed    Star-Rating Drugs: Atorvastatin 40 mg -  Last filled 05/19/22 90 DS at [facility] 5 mg - Last filled 03/26/22 90 at [facility] Janumet 50 -500 mg - Last  filled 05/19/22 15 DS at [facility]   Assessment/Plan Hyperlipidemia: (LDL goal < 70) -Controlled -Current treatment: Atorvastatin 40mg  1 qd Appropriate, Effective, Safe, Accessible -Medications previously tried: None  -Current dietary patterns: Not discussed -Current exercise habits: not discussed -Educated on Cholesterol goals;  Benefits of statin for ASCVD risk reduction; Importance of limiting foods high in cholesterol; -Recommended to continue current medication  Diabetes (A1c goal <7%) -Controlled -Current medications: Farxiga 5mg  1 qd Appropriate, Effective, Safe, Accessible Janumet 50-500mg  1 BID ---taking once daily Appropriate, Effective, Safe, Accessible -Medications previously tried: Invokana, Jardiance, Amaryl, Actos -Current home glucose readings - checks once daily Denies any sugars out of range -Denies hypoglycemic/hyperglycemic symptoms -Current meal patterns:  Is mindful of carb intake, does admit to slip-ups but generally tries to make good choices -Current exercise: not discussed -Educated on A1c and blood sugar goals; Complications of diabetes including kidney damage, retinal damage, and cardiovascular disease; Prevention and management of hypoglycemic episodes; Benefits of routine self-monitoring of blood sugar; -Counseled to check feet daily and get yearly eye exams -Recommended to continue current medication - Janumet and Farxiga approved through [employer]  Hypertension (BP goal <130/80) and Heart Failure (Goal: manage symptoms and prevent exacerbations) -Controlled -Last ejection fraction: 45-50% (Date: 09/28/21) -HF type: HFimpEF (EF improved from <40% to > 40%) -NYHA Class: II (slight limitation of activity) -AHA HF Stage: C (Heart disease and symptoms present) -Current treatment: Carvedilol 6.25mg  BID Appropriate, Effective, Safe, Accessible Farxiga 5mg  1 qd Appropriate, Effective, Safe, Accessible Entresto 49-51mg  1 BID Appropriate, Effective, Safe,  Accessible Spironolactone 25mg  1 qd Appropriate, Effective, Safe, Accessible -Medications previously tried: Amlodipine, Losartan -Current home BP/HR readings: does have a machine -Current home daily weights: checks weight daily and is mindful of fluctuations, currently has been intentionally losing -Current dietary habits: mindful of salt intake -Current exercise habits: Not discussed -Educated on Benefits of medications for managing symptoms and prolonging life Importance of blood pressure control -Recommended to continue current medication  CAD (Goal: Slow progression of atherosclerosis (plaques / blockages) throughout your body to reduce risk of heart attack and strokes) -Not assessed today Current Medication Therapy: Aspirin 47m 1 qd Appropriate, Effective, Safe, Accessible  Osteoarthritis (Goal: Pain relief that still allows for ADLs) -Not assessed today -Current treatment  Tizanidine 2mg  1 every 6-8 hours as needed for pain Appropriate, Effective, Safe, Accessible Meloxicam 15mg  1 tab daily with food Appropriate, Effective, Safe, Accessible Norco 5-325mg  1 q 6h prn Appropriate, Effective, Safe, Accessible Acetaminophen 650mg  q 8 prn Appropriate, Effective, Safe, Accessible -Counseled on tylenol intake 3200mg /day  Sherrill Raring Clinical Pharmacist [phone]

## 2022-06-20 ENCOUNTER — Ambulatory Visit: Payer: Medicare Other

## 2022-06-20 ENCOUNTER — Telehealth: Payer: Self-pay | Admitting: Internal Medicine

## 2022-06-20 ENCOUNTER — Other Ambulatory Visit: Payer: Self-pay | Admitting: Medical

## 2022-06-20 DIAGNOSIS — E118 Type 2 diabetes mellitus with unspecified complications: Secondary | ICD-10-CM

## 2022-06-20 MED ORDER — ACCU-CHEK SOFTCLIX LANCETS MISC
2 refills | Status: AC
Start: 2022-06-20 — End: ?

## 2022-06-20 MED ORDER — ACCU-CHEK GUIDE VI STRP
ORAL_STRIP | 2 refills | Status: AC
Start: 2022-06-20 — End: ?

## 2022-06-20 MED ORDER — ACCU-CHEK GUIDE W/DEVICE KIT
PACK | 0 refills | Status: AC
Start: 2022-06-20 — End: ?

## 2022-06-20 NOTE — Telephone Encounter (Signed)
filled

## 2022-06-20 NOTE — Telephone Encounter (Signed)
-----   Message from Sherrill Raring, [facility] sent at 06/20/2022 12:01 PM EDT ----- Regarding: Rx Request Pt requesting new rx for a diabetic meter replacement and supplies:  Accu Check Guide  Pharmacy Info: [facility] [address] [place], [place] - [address]  Phone: [phone] Fax: [phone]   Thank you! Sherrill Raring Clinical Pharmacist [phone]

## 2022-06-24 ENCOUNTER — Other Ambulatory Visit: Payer: Self-pay | Admitting: Cardiovascular Disease

## 2022-06-25 DIAGNOSIS — M25512 Pain in left shoulder: Secondary | ICD-10-CM | POA: Diagnosis not present

## 2022-06-25 DIAGNOSIS — S161XXS Strain of muscle, fascia and tendon at neck level, sequela: Secondary | ICD-10-CM | POA: Diagnosis not present

## 2022-06-25 DIAGNOSIS — M79602 Pain in left arm: Secondary | ICD-10-CM | POA: Diagnosis not present

## 2022-06-27 DIAGNOSIS — S161XXA Strain of muscle, fascia and tendon at neck level, initial encounter: Secondary | ICD-10-CM | POA: Diagnosis not present

## 2022-06-27 DIAGNOSIS — M25512 Pain in left shoulder: Secondary | ICD-10-CM | POA: Diagnosis not present

## 2022-06-27 DIAGNOSIS — M79602 Pain in left arm: Secondary | ICD-10-CM | POA: Diagnosis not present

## 2022-07-02 DIAGNOSIS — M25512 Pain in left shoulder: Secondary | ICD-10-CM | POA: Diagnosis not present

## 2022-07-02 DIAGNOSIS — M79602 Pain in left arm: Secondary | ICD-10-CM | POA: Diagnosis not present

## 2022-07-02 DIAGNOSIS — S161XXS Strain of muscle, fascia and tendon at neck level, sequela: Secondary | ICD-10-CM | POA: Diagnosis not present

## 2022-07-09 DIAGNOSIS — M79602 Pain in left arm: Secondary | ICD-10-CM | POA: Diagnosis not present

## 2022-07-09 DIAGNOSIS — M25512 Pain in left shoulder: Secondary | ICD-10-CM | POA: Diagnosis not present

## 2022-07-09 DIAGNOSIS — S161XXS Strain of muscle, fascia and tendon at neck level, sequela: Secondary | ICD-10-CM | POA: Diagnosis not present

## 2022-07-11 DIAGNOSIS — S161XXS Strain of muscle, fascia and tendon at neck level, sequela: Secondary | ICD-10-CM | POA: Diagnosis not present

## 2022-07-11 DIAGNOSIS — M25512 Pain in left shoulder: Secondary | ICD-10-CM | POA: Diagnosis not present

## 2022-07-11 DIAGNOSIS — M79602 Pain in left arm: Secondary | ICD-10-CM | POA: Diagnosis not present

## 2022-07-11 DIAGNOSIS — M542 Cervicalgia: Secondary | ICD-10-CM | POA: Diagnosis not present

## 2022-07-11 DIAGNOSIS — M4722 Other spondylosis with radiculopathy, cervical region: Secondary | ICD-10-CM | POA: Diagnosis not present

## 2022-07-15 DIAGNOSIS — M542 Cervicalgia: Secondary | ICD-10-CM | POA: Diagnosis not present

## 2022-07-23 NOTE — Telephone Encounter (Signed)
Pt informed samples Farxiga up front

## 2022-07-30 DIAGNOSIS — M542 Cervicalgia: Secondary | ICD-10-CM | POA: Diagnosis not present

## 2022-08-09 DIAGNOSIS — M5412 Radiculopathy, cervical region: Secondary | ICD-10-CM | POA: Diagnosis not present

## 2022-08-14 ENCOUNTER — Other Ambulatory Visit: Payer: Self-pay | Admitting: Medical

## 2022-08-22 DIAGNOSIS — M5412 Radiculopathy, cervical region: Secondary | ICD-10-CM | POA: Diagnosis not present

## 2022-09-09 ENCOUNTER — Ambulatory Visit: Payer: Medicare Other | Admitting: Physician Assistant

## 2022-09-09 ENCOUNTER — Telehealth: Payer: Self-pay

## 2022-09-09 NOTE — Telephone Encounter (Signed)
I called pt to reschedule appt. Provider needs to leave. Pt answered I gave her my name and then she hung up.

## 2022-09-13 DIAGNOSIS — M5412 Radiculopathy, cervical region: Secondary | ICD-10-CM | POA: Diagnosis not present

## 2022-09-23 NOTE — Progress Notes (Unsigned)
Office Visit    Patient Name: Darlene Wade Date of Encounter: 09/23/2022  Primary Care Provider:  Jac Canavan, PA-C Primary Cardiologist:  None Primary Electrophysiologist: None   Past Medical History    Past Medical History:  Diagnosis Date   Asthma 07/20/2018   one puff per day   Chronic combined systolic and diastolic heart failure (HCC) 03/03/2018   Echo 07/2018: EF 40-45, diff HK worse in Inf base, normal RVSF   Diabetes mellitus without complication (HCC) 2012   Diabetic eye exam (HCC)    [employer]   Dyspnea    Elbow fracture, right 2007   Former smoker    20 pack year history, quit 2010   Hyperlipidemia    Hypertension    Insomnia    Lung nodule    Chest CT 07/2018:  RLL nodule resolved.  3 mm subpleural LUL nodule.  Repeat in 1 year if high risk.    Obesity    PONV (postoperative nausea and vomiting) 2014   1 time   Wears glasses    reading   Past Surgical History:  Procedure Laterality Date   BREAST CYST ASPIRATION  2015   BREAST EXCISIONAL BIOPSY Left    BREAST LUMPECTOMY WITH NEEDLE LOCALIZATION Left 10/07/2012   Procedure: BREAST LUMPECTOMY WITH NEEDLE LOCALIZATION;  Surgeon: Wilmon Arms. Corliss Skains, MD;  Location: [facility];  Service: General;  Laterality: Left;   BREAST SURGERY     COLONOSCOPY  01/2016   01/2016 with Dr. Myrtie Neither, tubular adenoma polpy; 2003 with Dr. Loreta Ave   ELBOW ARTHROPLASTY  2006   rt-fx   FOOT ARTHROTOMY  12/2010   foot fusion, right   FOOT MASS EXCISION  04/2012   left-fusion   HYSTERECTOMY ABDOMINAL WITH SALPINGECTOMY Bilateral 1985   PARTIAL HYSTERECTOMY  age 45   uterine fibroids, still has ovaries   TONSILLECTOMY     TOTAL KNEE ARTHROPLASTY Left 08/10/2018   Procedure: Left Knee Arthroplasty;  Surgeon: Gean Birchwood, MD;  Location: [facility];  Service: Orthopedics;  Laterality: Left;    Allergies  Allergies  Allergen Reactions   Jardiance [Empagliflozin]     Yeast infections, tolerates farxiga though    Naproxen Nausea Only     History of Present Illness    Darlene Wade  is a 67 year old female with a PMH of with PMH of HTN, HLD, chronic combined CHF, DM type II, asthma, osteoarthritis who presents today for 1 year follow-up.  Darlene Wade was seen initially by Dr. [name] in 2019 for management of shortness of breath.  2D echo was completed showing EF of 40-45% with grade 1 DD.  She was started on GDMT coronary CT to determine etiology of decreased LV function that showed mild nonobstructive CAD.  There were also incidental findings of right lower lobe pulmonary nodules with obstruction repeat CT in 3 months. Repeat echocardiogram obtained on 07/27/2018 continue to show EF 40 to 45%, diffuse hypokinesis worse in the inferior base.  GDMT was further titrated with addition of Entresto and spironolactone with repeat 2D echo completed changed EF at 45-50% with trivial MVR.  Since last being seen in the office patient reports that she is experienced any new cardiac complaints.  Her blood pressure today is controlled at 118/62 however showed new onset atrial fibrillation.  She has a heart rate of 134 bpm and has been asymptomatic with no awareness of arrhythmia.  She is euvolemic on examination with slight swelling in  her left lower extremity and denies any shortness of breath.  She is being treated for bulging disc in her neck and does have complaints of headaches.  Today's visit we discussed the pathophysiology of atrial fibrillation and patient had all questions answered to her satisfaction.  Patient denies chest pain, palpitations, dyspnea, PND, orthopnea, nausea, vomiting, dizziness, syncope, edema, weight gain, or early satiety.    Home Medications    Current Outpatient Medications  Medication Sig Dispense Refill   ACCU-CHEK GUIDE test strip TEST 1 TO 2 TIMES DAILY 100 strip 2   Accu-Chek Softclix Lancets lancets Test 1-2 times daily 100 each 2   acetaminophen (TYLENOL) 650 MG CR tablet Take  650 mg by mouth every 8 (eight) hours as needed for pain.     aspirin EC 81 MG tablet Take 1 tablet (81 mg total) by mouth daily. 90 tablet 3   atorvastatin (LIPITOR) 40 MG tablet Take 1 tablet by mouth once daily 90 tablet 0   Blood Glucose Monitoring Suppl (ACCU-CHEK GUIDE) w/Device KIT Use to check with test strips 1 kit 0   carvedilol (COREG) 6.25 MG tablet Take 1 tablet (6.25 mg total) by mouth 2 (two) times daily. Please call [phone] to schedule an August appointment for future refills. Thank you. 180 tablet 0   Cholecalciferol (VITAMIN D) 50 MCG (2000 UT) CAPS Take 1 capsule (2,000 Units total) by mouth daily. 90 capsule 3   dapagliflozin propanediol (FARXIGA) 5 MG TABS tablet Take 1 tablet (5 mg total) by mouth daily before breakfast. 90 tablet 1   glucose blood (ACCU-CHEK GUIDE) test strip Test 1-2 times daily 100 each 2   HYDROcodone-acetaminophen (NORCO) 5-325 MG tablet Take 1 tablet by mouth every 6 (six) hours as needed. 12 tablet 0   meloxicam (MOBIC) 15 MG tablet Take 15 mg by mouth daily.     Multiple Vitamin (MULTIVITAMIN) tablet Take 1 tablet by mouth daily. (Patient not taking: Reported on 06/20/2022)     sacubitril-valsartan (ENTRESTO) 49-51 MG Take 1 tablet by mouth twice daily 60 tablet 11   sitaGLIPtin-metformin (JANUMET) 50-500 MG tablet Take 1 tablet by mouth 2 (two) times daily with a meal. 180 tablet 1   spironolactone (ALDACTONE) 25 MG tablet Take 25 mg by mouth daily.     tiZANidine (ZANAFLEX) 2 MG tablet Take 2 mg by mouth every 6 (six) hours as needed for muscle spasms.     traMADol (ULTRAM) 50 MG tablet Take 50 mg by mouth every 6 (six) hours as needed.     No current facility-administered medications for this visit.     Review of Systems  Please see the history of present illness.    (+) Tachycardia (+) Headaches, neck pain  All other systems reviewed and are otherwise negative except as noted above.  Physical Exam    Wt Readings from Last 3  Encounters:  05/21/22 211 lb (95.7 kg)  05/02/22 209 lb 9.6 oz (95.1 kg)  11/13/21 212 lb 12.8 oz (96.5 kg)   WG:NFAOZ were no vitals filed for this visit.,There is no height or weight on file to calculate BMI.  Constitutional:      Appearance: Healthy appearance. Not in distress.  Neck:     Vascular: JVD normal.  Pulmonary:     Effort: Pulmonary effort is normal.     Breath sounds: No wheezing. No rales. Diminished in the bases Cardiovascular:     Irregularly irregular normal S1. Normal S2.      Murmurs:  There is no murmur.  Edema:    Peripheral edema in left lower extremity.  Abdominal:     Palpations: Abdomen is soft non tender. There is no hepatomegaly.  Skin:    General: Skin is warm and dry.  Neurological:     General: No focal deficit present.     Mental Status: Alert and oriented to person, place and time.     Cranial Nerves: Cranial nerves are intact.  EKG/LABS/ Recent Cardiac Studies    ECG personally reviewed by me today -atrial fibrillation with RVR and rate of 134 bpm with no acute symptoms noted   Risk Assessment/Calculations:    CHA2DS2-VASc Score = 5   This indicates a 7.2% annual risk of stroke. The patient's score is based upon: CHF History: 1 HTN History: 1 Diabetes History: 1 Stroke History: 0 Vascular Disease History: 0 Age Score: 1 Gender Score: 1           Lab Results  Component Value Date   WBC 6.0 11/13/2021   HGB 12.1 11/13/2021   HCT 39.0 11/13/2021   MCV 89 11/13/2021   PLT 249 11/13/2021   Lab Results  Component Value Date   CREATININE 0.86 09/28/2021   BUN 19 09/28/2021   NA 140 09/28/2021   K 4.3 09/28/2021   CL 104 09/28/2021   CO2 23 09/28/2021   Lab Results  Component Value Date   ALT 10 09/28/2021   AST 12 12/19/2020   ALKPHOS 105 12/19/2020   BILITOT 0.4 12/19/2020   Lab Results  Component Value Date   CHOL 128 09/28/2021   HDL 63 09/28/2021   LDLCALC 50 09/28/2021   TRIG 74 09/28/2021   CHOLHDL 2.0  09/28/2021    Lab Results  Component Value Date   HGBA1C 6.8 (H) 05/02/2022     Assessment & Plan    1.  New onset atrial fibrillation with RVR: -EKG completed today and patient was found to be in new onset AF with RVR and rate of 134 -She is completely asymptomatic with no awareness of arrhythmia. -CHA2DS2-VASc Score = 5 [CHF History: 1, HTN History: 1, Diabetes History: 1, Stroke History: 0, Vascular Disease History: 0, Age Score: 1, Gender Score: 1].  Therefore, the patient's annual risk of stroke is 7.2 %.     -Patient will start Eliquis 5 mg twice daily -We will increase carvedilol to 12.5 mg twice daily and patient was advised to go to the ED if heart rate increases and shortness of breath or dizziness occurs. -Ambulatory referral to AF clinic management.  2.  Essential hypertension: -Patient's blood pressure today was controlled at 118/62 -Continue carvedilol 12.5 mg twice daily, spironolactone 25 mg daily and Entresto 49/51 mg  3.  Hyperlipidemia: -Patient's LDL cholesterol was controlled at 50 -Continue Lipitor 40 mg daily  4.  DM type II: -Patient's hemoglobin A1c was 6.8 -Continue Farxiga and Janumet  5.  Chronic combined CHF: -Most recent 2D echo completed on 09/2021 showing EF of 45-50% with mildly decreased LV function and global hypokinesis with no significant valvular abnormalities. -Today patient reports no shortness of and has been compliant with her current medications. -Continue GDMT with Entresto 49/51 mg twice daily, spironolactone 25 mg daily, Farxiga 5 mg daily carvedilol 12.5 mg twice daily -Patient was advised to abstain from excess salt and weigh herself daily.    Disposition: Follow-up with None or APP in 1 months    Medication Adjustments/Labs and Tests Ordered: Current medicines are reviewed at  length with the patient today.  Concerns regarding medicines are outlined above.   Signed, Napoleon Form, Leodis Rains, NP 09/23/2022, 7:42 AM [facility]

## 2022-09-24 ENCOUNTER — Encounter: Payer: Self-pay | Admitting: Nurse Practitioner

## 2022-09-24 ENCOUNTER — Ambulatory Visit: Payer: Medicare Other | Attending: Nurse Practitioner | Admitting: Nurse Practitioner

## 2022-09-24 VITALS — BP 118/62 | HR 134 | Ht 62.0 in | Wt 214.6 lb

## 2022-09-24 DIAGNOSIS — I5042 Chronic combined systolic (congestive) and diastolic (congestive) heart failure: Secondary | ICD-10-CM | POA: Diagnosis not present

## 2022-09-24 DIAGNOSIS — I10 Essential (primary) hypertension: Secondary | ICD-10-CM | POA: Diagnosis not present

## 2022-09-24 DIAGNOSIS — I4891 Unspecified atrial fibrillation: Secondary | ICD-10-CM | POA: Diagnosis not present

## 2022-09-24 DIAGNOSIS — E785 Hyperlipidemia, unspecified: Secondary | ICD-10-CM | POA: Diagnosis not present

## 2022-09-24 DIAGNOSIS — E118 Type 2 diabetes mellitus with unspecified complications: Secondary | ICD-10-CM | POA: Diagnosis not present

## 2022-09-24 DIAGNOSIS — E1169 Type 2 diabetes mellitus with other specified complication: Secondary | ICD-10-CM

## 2022-09-24 LAB — BASIC METABOLIC PANEL
BUN/Creatinine Ratio: 19 (ref 12–28)
BUN: 17 mg/dL (ref 8–27)
CO2: 23 mmol/L (ref 20–29)
Calcium: 9.9 mg/dL (ref 8.7–10.3)
Chloride: 104 mmol/L (ref 96–106)
Creatinine, Ser: 0.88 mg/dL (ref 0.57–1.00)
Glucose: 145 mg/dL — ABNORMAL HIGH (ref 70–99)
Potassium: 3.5 mmol/L (ref 3.5–5.2)
Sodium: 142 mmol/L (ref 134–144)
eGFR: 71 mL/min/{1.73_m2} (ref >59)

## 2022-09-24 LAB — CBC

## 2022-09-24 LAB — MAGNESIUM: Magnesium: 1.6 mg/dL (ref 1.6–2.3)

## 2022-09-24 LAB — TSH: TSH: 1.48 u[IU]/mL (ref 0.450–4.500)

## 2022-09-24 MED ORDER — APIXABAN 5 MG PO TABS: 5.0000 mg | ORAL_TABLET | Freq: Two times a day (BID) | ORAL | 3 refills | Status: DC

## 2022-09-24 MED ORDER — CARVEDILOL 12.5 MG PO TABS: 12.5000 mg | ORAL_TABLET | Freq: Two times a day (BID) | ORAL | 3 refills | Status: DC

## 2022-09-24 MED ORDER — APIXABAN 5 MG PO TABS: 5.0000 mg | ORAL_TABLET | Freq: Two times a day (BID) | ORAL | 0 refills | Status: DC

## 2022-09-24 NOTE — Patient Instructions (Addendum)
Medication Instructions:  INCREASE Coreg to 12.5mg  Take 1 tablet twice a day  START Eliquis 5mg  Take 1 tablet twice a day  STOP Aspirin 81mg  *If you need a refill on your cardiac medications before your next appointment, please call your pharmacy*   Lab Work: TODAY-BMET, CBC, MAG & TSH  If you have labs (blood work) drawn today and your tests are completely normal, you will receive your results only by: MyChart Message (if you have MyChart) OR A paper copy in the mail If you have any lab test that is abnormal or we need to change your treatment, we will call you to review the results.   Testing/Procedures: NONE ORDERED   Follow-Up: At [facility], you and your health needs are our priority.  As part of our continuing mission to provide you with exceptional heart care, we have created designated Provider Care Teams.  These Care Teams include your primary Cardiologist (physician) and Advanced Practice Providers (APPs -  Physician Assistants and Nurse Practitioners) who all work together to provide you with the care you need, when you need it.  We recommend signing up for the patient portal called "MyChart".  Sign up information is provided on this After Visit Summary.  MyChart is used to connect with patients for Virtual Visits (Telemedicine).  Patients are able to view lab/test results, encounter notes, upcoming appointments, etc.  Non-urgent messages can be sent to your provider as well.   To learn more about what you can do with MyChart, go to [email or web address].    Your next appointment:   1 month(s)  Provider:   Robin Searing, NP       You have been referred to [facility] Other Instructions: CHECK YOUR WEIGHT DAILY  GET SOME COMPRESSION STOCKING OR TED HOSE WEAR DURING THE DAY AND REMOVE AT NIGHT   Check your blood pressure daily for 2 weeks, then contact the office with your readings.  Make sure to check 2 hours after your medications.   AVOID these  things for 30 minutes before checking your blood pressure: No Drinking caffeine. No Drinking alcohol. No Eating. No Smoking. No Exercising.  Five minutes before checking your blood pressure: Pee. Sit in a dining chair. Avoid sitting in a soft couch or armchair. Be quiet. Do not talk.

## 2022-09-26 ENCOUNTER — Other Ambulatory Visit: Payer: Self-pay | Admitting: Medical

## 2022-09-26 DIAGNOSIS — Z1231 Encounter for screening mammogram for malignant neoplasm of breast: Secondary | ICD-10-CM

## 2022-10-03 ENCOUNTER — Inpatient Hospital Stay (HOSPITAL_COMMUNITY)
Admission: RE | Admit: 2022-10-03 | Discharge: 2022-10-03 | Disposition: A | Discharge status: Home / Self Care | Payer: Medicare Other | Source: Ambulatory Visit | Attending: Internal Medicine | Admitting: Internal Medicine

## 2022-10-03 ENCOUNTER — Ambulatory Visit (HOSPITAL_COMMUNITY)
Admission: RE | Admit: 2022-10-03 | Discharge: 2022-10-03 | Disposition: A | Discharge status: Home / Self Care | Payer: Medicare Other | Source: Ambulatory Visit | Attending: Internal Medicine | Admitting: Internal Medicine

## 2022-10-03 VITALS — BP 112/68 | HR 76 | Ht 62.0 in | Wt 213.8 lb

## 2022-10-03 DIAGNOSIS — I110 Hypertensive heart disease with heart failure: Secondary | ICD-10-CM | POA: Insufficient documentation

## 2022-10-03 DIAGNOSIS — I480 Paroxysmal atrial fibrillation: Secondary | ICD-10-CM | POA: Diagnosis not present

## 2022-10-03 DIAGNOSIS — E785 Hyperlipidemia, unspecified: Secondary | ICD-10-CM | POA: Insufficient documentation

## 2022-10-03 DIAGNOSIS — I2510 Atherosclerotic heart disease of native coronary artery without angina pectoris: Secondary | ICD-10-CM | POA: Diagnosis not present

## 2022-10-03 DIAGNOSIS — I5032 Chronic diastolic (congestive) heart failure: Secondary | ICD-10-CM | POA: Diagnosis not present

## 2022-10-03 DIAGNOSIS — Z79899 Other long term (current) drug therapy: Secondary | ICD-10-CM | POA: Insufficient documentation

## 2022-10-03 DIAGNOSIS — I4891 Unspecified atrial fibrillation: Secondary | ICD-10-CM | POA: Diagnosis not present

## 2022-10-03 DIAGNOSIS — D6869 Other thrombophilia: Secondary | ICD-10-CM

## 2022-10-03 DIAGNOSIS — J45909 Unspecified asthma, uncomplicated: Secondary | ICD-10-CM | POA: Diagnosis not present

## 2022-10-03 DIAGNOSIS — Z7901 Long term (current) use of anticoagulants: Secondary | ICD-10-CM | POA: Diagnosis not present

## 2022-10-03 DIAGNOSIS — E119 Type 2 diabetes mellitus without complications: Secondary | ICD-10-CM | POA: Insufficient documentation

## 2022-10-03 NOTE — Progress Notes (Signed)
Primary Care Physician: Jac Canavan, PA-C Primary Cardiologist: None Electrophysiologist: None     Referring Physician: Robin Searing, NP     Darlene Wade is a 67 y.o. female with a history of HTN, mild nonobstructive CAD, HLD, chronic combined CHF, T2DM, asthma, osteoarthritis, and paroxysmal atrial fibrillation who presents for consultation in the [facility]. Seen by Cardiology on 8/13 and found to be in new onset Afib with RVR. Patient unaware of arrhythmia. Coreg increased to 12.5 mg BID. Patient is on Eliquis for a CHADS2VASC score of 5.  On evaluation today, she is currently in NSR. She confirms that she could not feel when she was in Afib. She has an Apple watch SE that does not record rhythm. She has occasional alcohol. She does endorse snoring and waking up with a dry mouth frequently.  Today, she denies symptoms of palpitations, chest pain, shortness of breath, orthopnea, PND, lower extremity edema, dizziness, presyncope, syncope, snoring, daytime somnolence, bleeding, or neurologic sequela. The patient is tolerating medications without difficulties and is otherwise without complaint today.    Atrial Fibrillation Risk Factors:  she does have symptoms or diagnosis of sleep apnea.  she has a BMI of Body mass index is 39.1 kg/m.Marland Kitchen Filed Weights   10/03/22 0824  Weight: 97 kg    Current Outpatient Medications  Medication Sig Dispense Refill   Accu-Chek Softclix Lancets lancets Test 1-2 times daily 100 each 2   acetaminophen (TYLENOL) 650 MG CR tablet Take 650 mg by mouth as needed for pain.     apixaban (ELIQUIS) 5 MG TABS tablet Take 1 tablet (5 mg total) by mouth 2 (two) times daily. 60 tablet 3   atorvastatin (LIPITOR) 40 MG tablet Take 1 tablet by mouth once daily 90 tablet 0   Blood Glucose Monitoring Suppl (ACCU-CHEK GUIDE) w/Device KIT Use to check with test strips 1 kit 0   carvedilol (COREG) 12.5 MG tablet Take 1 tablet (12.5 mg  total) by mouth 2 (two) times daily with a meal. 60 tablet 3   Cholecalciferol (VITAMIN D) 50 MCG (2000 UT) CAPS Take 1 capsule (2,000 Units total) by mouth daily. 90 capsule 3   dapagliflozin propanediol (FARXIGA) 5 MG TABS tablet Take 1 tablet (5 mg total) by mouth daily before breakfast. 90 tablet 1   gabapentin (NEURONTIN) 300 MG capsule Take 300 mg by mouth at bedtime.     glucose blood (ACCU-CHEK GUIDE) test strip Test 1-2 times daily 100 each 2   Multiple Vitamin (MULTIVITAMIN) tablet Take 1 tablet by mouth daily.     sacubitril-valsartan (ENTRESTO) 49-51 MG Take 1 tablet by mouth twice daily 60 tablet 11   sitaGLIPtin-metformin (JANUMET) 50-500 MG tablet Take 1 tablet by mouth 2 (two) times daily with a meal. (Patient taking differently: Take 1 tablet by mouth daily with breakfast.) 180 tablet 1   spironolactone (ALDACTONE) 25 MG tablet Take 25 mg by mouth daily.     tiZANidine (ZANAFLEX) 2 MG tablet Take 2 mg by mouth every 6 (six) hours as needed for muscle spasms.     apixaban (ELIQUIS) 5 MG TABS tablet Take 1 tablet (5 mg total) by mouth 2 (two) times daily. (Patient not taking: Reported on 10/03/2022) 56 tablet 0   meloxicam (MOBIC) 15 MG tablet Take 15 mg by mouth daily. (Patient not taking: Reported on 10/03/2022)     No current facility-administered medications for this encounter.    Atrial Fibrillation Management history:  Previous  antiarrhythmic drugs: None Previous cardioversions: None Previous ablations: None Anticoagulation history: Eliquis   ROS- All systems are reviewed and negative except as per the HPI above.  Physical Exam: BP 112/68   Pulse 76   Ht 5\' 2"  (1.575 m)   Wt 97 kg   BMI 39.10 kg/m   GEN: Well nourished, well developed in no acute distress NECK: No JVD; No carotid bruits CARDIAC: Regular rate and rhythm, no murmurs, rubs, gallops RESPIRATORY:  Clear to auscultation without rales, wheezing or rhonchi  ABDOMEN: Soft, non-tender,  non-distended EXTREMITIES:  No edema; No deformity   EKG today demonstrates  Vent. rate 76 BPM PR interval 136 ms QRS duration 78 ms QT/QTcB 378/425 ms P-R-T axes -11 72 73 Normal sinus rhythm Low voltage QRS Nonspecific ST and T wave abnormality Abnormal ECG When compared with ECG of 24-Sep-2022 08:24, PREVIOUS ECG IS PRESENT  Echo 09/28/21 demonstrated  1. Left ventricular ejection fraction, by estimation, is 45 to 50%. The  left ventricle has mildly decreased function. The left ventricle  demonstrates global hypokinesis. Left ventricular diastolic parameters are  indeterminate.   2. Right ventricular systolic function is normal. The right ventricular  size is normal. Tricuspid regurgitation signal is inadequate for assessing  PA pressure.   3. The mitral valve is grossly normal. Trivial mitral valve  regurgitation. No evidence of mitral stenosis.   4. The aortic valve is tricuspid. There is mild calcification of the  aortic valve. There is mild thickening of the aortic valve. Aortic valve  regurgitation is not visualized. Aortic valve sclerosis is present, with  no evidence of aortic valve stenosis.   5. The inferior vena cava is normal in size with greater than 50%  respiratory variability, suggesting right atrial pressure of 3 mmHg.   ASSESSMENT & PLAN CHA2DS2-VASc Score = 5  The patient's score is based upon: CHF History: 1 HTN History: 1 Diabetes History: 1 Stroke History: 0 Vascular Disease History: 0 Age Score: 1 Gender Score: 1       ASSESSMENT AND PLAN: Paroxysmal Atrial Fibrillation (ICD10:  I48.0) The patient's CHA2DS2-VASc score is 5, indicating a 7.2% annual risk of stroke.    Education provided about Afib. We will place a cardiac monitor to determine burden. Recommended rhythm monitoring device. Will refer for sleep study. We will call and set up follow up after monitor results are finalized.   Secondary Hypercoagulable State (ICD10:  D68.69) The  patient is at significant risk for stroke/thromboembolism based upon her CHA2DS2-VASc Score of 5.  Continue Apixaban (Eliquis).  She missed a dose a few days. Emphasized compliance for protection. She can either get a CBC at f/u with Cardiology or when we see her to discuss monitor results.   Follow up as scheduled with Cardiology.   Lake Bells, PA-C  Afib Clinic [facility] [address] [place], [place] [address] [phone]

## 2022-10-03 NOTE — Patient Instructions (Addendum)
Kardia  Apple watch series 4 or greater for ECG

## 2022-10-12 ENCOUNTER — Other Ambulatory Visit: Payer: Self-pay | Admitting: Cardiovascular Disease

## 2022-10-15 DIAGNOSIS — I480 Paroxysmal atrial fibrillation: Secondary | ICD-10-CM | POA: Diagnosis not present

## 2022-10-23 NOTE — Progress Notes (Unsigned)
Cardiology Office Note    Patient Name: Darlene Wade Date of Encounter: 10/24/2022  Primary Care Provider:  Jac Canavan, PA-C Primary Cardiologist:  None Primary Electrophysiologist: None   Past Medical History    Past Medical History:  Diagnosis Date   Asthma 07/20/2018   one puff per day   Chronic combined systolic and diastolic heart failure (HCC) 03/03/2018   Echo 07/2018: EF 40-45, diff HK worse in Inf base, normal RVSF   Diabetes mellitus without complication (HCC) 2012   Diabetic eye exam (HCC)    [facility]   Dyspnea    Elbow fracture, right 2007   Former smoker    20 pack year history, quit 2010   Hyperlipidemia    Hypertension    Insomnia    Lung nodule    Chest CT 07/2018:  RLL nodule resolved.  3 mm subpleural LUL nodule.  Repeat in 1 year if high risk.    Obesity    PONV (postoperative nausea and vomiting) 2014   1 time   Wears glasses    reading    History of Present Illness   Darlene Wade  is a 67 year old female with a PMH of with PMH of HTN, HLD, chronic combined CHF, DM type II, asthma, osteoarthritis who presents today for 1 month follow-up.  Darlene Wade was seen on 09/24/2022 for 1 year follow-up.  During visit patient's EKG showed new onset AF with a heart rate of 134 bpm.  She was started on Eliquis 5 mg twice daily and placed on rate control with carvedilol 12.5 mg twice daily.  She was referred to the AF clinic for further evaluation.  She was seen on 10/03/2022 by Darlene Bells, PA and was in sinus rhythm during visit.  She was referred to sleep study due to history of snoring and a Zio patch that showed predominantly sinus rhythm with 2.2% SVT ectopy and triggered episodes associated with sinus tach and sinus.  During today's visit the patient reports that she has been doing much better and blood pressure today was well-controlled at 100/60.  Her heart rate has been stable at 81 bpm and she reports no episodes of tachycardia since her  previous follow-up.  She is scheduled to follow-up with sleep study next few weeks.  She is euvolemic today on examination and has lost a total of 5 pounds since her previous visit.  She attributes her weight loss to eating better and abstaining from excess salt.  She is also trying to increase her physical activity and walks 2000-5000 steps daily.  I advised her to increase her steps to at least 10,000 steps a day and 150 minutes of exercise per week.  She is tolerating Eliquis and carvedilol without any adverse reactions..  Patient denies chest pain, palpitations, dyspnea, PND, orthopnea, nausea, vomiting, dizziness, syncope, edema, weight gain, or early satiety.   Review of Systems  Please see the history of present illness.    All other systems reviewed and are otherwise negative except as noted above.  Physical Exam    Wt Readings from Last 3 Encounters:  10/24/22 208 lb (94.3 kg)  10/03/22 213 lb 12.8 oz (97 kg)  09/24/22 214 lb 9.6 oz (97.3 kg)   VS: Vitals:   10/24/22 1034  BP: 100/60  Pulse: 81  SpO2: 98%  ,Body mass index is 38.04 kg/m. GEN: Well nourished, well developed in no acute distress Neck: No JVD; No carotid bruits Pulmonary: Clear to  auscultation without rales, wheezing or rhonchi  Cardiovascular: Normal rate. Regular rhythm. Normal S1. Normal S2.   Murmurs: There is no murmur.  ABDOMEN: Soft, non-tender, non-distended EXTREMITIES:  No edema; No deformity   EKG/LABS/ Recent Cardiac Studies   ECG personally reviewed by me today -none completed today  Risk Assessment/Calculations:    CHA2DS2-VASc Score = 5   This indicates a 7.2% annual risk of stroke. The patient's score is based upon: CHF History: 1 HTN History: 1 Diabetes History: 1 Stroke History: 0 Vascular Disease History: 0 Age Score: 1 Gender Score: 1         Lab Results  Component Value Date   WBC 10.2 09/24/2022   HGB 11.9 09/24/2022   HCT 37.9 09/24/2022   MCV 89 09/24/2022   PLT 263  09/24/2022   Lab Results  Component Value Date   CREATININE 0.88 09/24/2022   BUN 17 09/24/2022   NA 142 09/24/2022   K 3.5 09/24/2022   CL 104 09/24/2022   CO2 23 09/24/2022   Lab Results  Component Value Date   CHOL 128 09/28/2021   HDL 63 09/28/2021   LDLCALC 50 09/28/2021   TRIG 74 09/28/2021   CHOLHDL 2.0 09/28/2021    Lab Results  Component Value Date   HGBA1C 6.8 (H) 05/02/2022   Assessment & Plan    1.  New onset atrial fibrillation with RVR: -Patient is doing well and is rate controlled today with carvedilol at 81 bpm. -Her hemoglobin is 12.1 and creatinine is 0.8 -CHA2DS2-VASc Score = 5 [CHF History: 1, HTN History: 1, Diabetes History: 1, Stroke History: 0, Vascular Disease History: 0, Age Score: 1, Gender Score: 1].  Therefore, the patient's annual risk of stroke is 7.2 %.     -Continue Eliquis 5 mg twice daily -She is scheduled to undergo a sleep study for further evaluation of atrial fibrillation. -Today's visit we discussed the importance of medication compliance and patient was given permission about stroke recognition -Will plan to have patient wear event monitor at follow-up to evaluate for AF burden.   2.  Essential hypertension: -Patient's blood pressure today was controlled at 100/60 -Continue carvedilol 12.5 mg twice daily, spironolactone 25 mg daily and Entresto 49/51 mg   3.  Hyperlipidemia: -Patient's LDL cholesterol was controlled at 50 -Continue Lipitor 40 mg daily   4.  DM type II: -Patient's hemoglobin A1c was 6.8 -Continue Farxiga and Janumet   5.  Chronic combined CHF: -Most recent 2D echo completed on 09/2021 showing EF of 45-50% with mildly decreased LV function and global hypokinesis with no significant valvular abnormalities. -Today patient reports no shortness of and has been compliant with her current medications. -Continue GDMT with Entresto 49/51 mg twice daily, spironolactone 25 mg daily, Farxiga 5 mg daily carvedilol 12.5 mg  twice daily -Patient was advised to abstain from excess salt and weigh herself daily.  -We will recheck 2D echo to evaluate LV function.  Disposition: Follow-up with None or APP in 3 months   Signed, Napoleon Form, Leodis Rains, NP 10/24/2022, 11:00 AM [facility]

## 2022-10-24 ENCOUNTER — Encounter: Payer: Self-pay | Admitting: Nurse Practitioner

## 2022-10-24 ENCOUNTER — Ambulatory Visit: Payer: Medicare Other | Attending: Nurse Practitioner | Admitting: Nurse Practitioner

## 2022-10-24 VITALS — BP 100/60 | HR 81 | Ht 62.0 in | Wt 208.0 lb

## 2022-10-24 DIAGNOSIS — E785 Hyperlipidemia, unspecified: Secondary | ICD-10-CM | POA: Diagnosis not present

## 2022-10-24 DIAGNOSIS — E118 Type 2 diabetes mellitus with unspecified complications: Secondary | ICD-10-CM

## 2022-10-24 DIAGNOSIS — E1169 Type 2 diabetes mellitus with other specified complication: Secondary | ICD-10-CM | POA: Diagnosis not present

## 2022-10-24 DIAGNOSIS — I10 Essential (primary) hypertension: Secondary | ICD-10-CM | POA: Diagnosis not present

## 2022-10-24 DIAGNOSIS — I480 Paroxysmal atrial fibrillation: Secondary | ICD-10-CM

## 2022-10-24 DIAGNOSIS — Z7984 Long term (current) use of oral hypoglycemic drugs: Secondary | ICD-10-CM | POA: Diagnosis not present

## 2022-10-24 DIAGNOSIS — I5042 Chronic combined systolic (congestive) and diastolic (congestive) heart failure: Secondary | ICD-10-CM

## 2022-10-24 MED ORDER — APIXABAN 5 MG PO TABS: 5.0000 mg | ORAL_TABLET | Freq: Two times a day (BID) | ORAL | 0 refills | Status: DC

## 2022-10-24 NOTE — Patient Instructions (Addendum)
Medication Instructions:  Take Tylenol Arthritis as needed *If you need a refill on your cardiac medications before your next appointment, please call your pharmacy*   Lab Work: TODAY-BMET & CBC If you have labs (blood work) drawn today and your tests are completely normal, you will receive your results only by: MyChart Message (if you have MyChart) OR A paper copy in the mail If you have any lab test that is abnormal or we need to change your treatment, we will call you to review the results.   Testing/Procedures: Your physician has requested that you have an echocardiogram. Echocardiography is a painless test that uses sound waves to create images of your heart. It provides your doctor with information about the size and shape of your heart and how well your heart's chambers and valves are working. This procedure takes approximately one hour. There are no restrictions for this procedure. Please do NOT wear cologne, perfume, aftershave, or lotions (deodorant is allowed). Please arrive 15 minutes prior to your appointment time.   Follow-Up: At [facility], you and your health needs are our priority.  As part of our continuing mission to provide you with exceptional heart care, we have created designated Provider Care Teams.  These Care Teams include your primary Cardiologist (physician) and Advanced Practice Providers (APPs -  Physician Assistants and Nurse Practitioners) who all work together to provide you with the care you need, when you need it.  We recommend signing up for the patient portal called "MyChart".  Sign up information is provided on this After Visit Summary.  MyChart is used to connect with patients for Virtual Visits (Telemedicine).  Patients are able to view lab/test results, encounter notes, upcoming appointments, etc.  Non-urgent messages can be sent to your provider as well.   To learn more about what you can do with MyChart, go to [email or web address].     Your next appointment:   3 month(s)  Provider:   Robin Searing, NP  Other Instructions Please check your weight daily. Please contact the office if you gain more than 2lbs in a day or 5lbs in a week.  Limit your salt intake to 1500-2000mg  per day or 500mg  of Sodium per meal. Please get some ted hose or compression stockings. They can be purchased at your local medical supply store, [employer], [employer] or [employer] fund[employer].  Put them on first thing in the morning and wear them during the day . Elevate your feet during the day and remove hose in the evening before bed.

## 2022-10-25 LAB — BASIC METABOLIC PANEL
BUN/Creatinine Ratio: 20 (ref 12–28)
BUN: 21 mg/dL (ref 8–27)
CO2: 25 mmol/L (ref 20–29)
Calcium: 10 mg/dL (ref 8.7–10.3)
Chloride: 100 mmol/L (ref 96–106)
Creatinine, Ser: 1.04 mg/dL — ABNORMAL HIGH (ref 0.57–1.00)
Glucose: 107 mg/dL — ABNORMAL HIGH (ref 70–99)
Potassium: 4.5 mmol/L (ref 3.5–5.2)
Sodium: 139 mmol/L (ref 134–144)
eGFR: 58 mL/min/{1.73_m2} — ABNORMAL LOW (ref >59)

## 2022-10-25 LAB — CBC
Hematocrit: 36.9 % (ref 34.0–46.6)
Hemoglobin: 11.7 g/dL (ref 11.1–15.9)
MCH: 28.3 pg (ref 26.6–33.0)
MCHC: 31.7 g/dL (ref 31.5–35.7)
MCV: 89 fL (ref 79–97)
Platelets: 246 10*3/uL (ref 150–450)
RBC: 4.13 x10E6/uL (ref 3.77–5.28)
RDW: 14.1 % (ref 11.7–15.4)
WBC: 8.1 10*3/uL (ref 3.4–10.8)

## 2022-11-01 DIAGNOSIS — M5412 Radiculopathy, cervical region: Secondary | ICD-10-CM | POA: Diagnosis not present

## 2022-11-04 DIAGNOSIS — I480 Paroxysmal atrial fibrillation: Secondary | ICD-10-CM | POA: Diagnosis not present

## 2022-11-04 DIAGNOSIS — R0683 Snoring: Secondary | ICD-10-CM | POA: Diagnosis not present

## 2022-11-11 ENCOUNTER — Ambulatory Visit (HOSPITAL_COMMUNITY): Payer: Medicare Other | Attending: Nurse Practitioner

## 2022-11-11 ENCOUNTER — Telehealth: Payer: Self-pay | Admitting: Nurse Practitioner

## 2022-11-11 DIAGNOSIS — I10 Essential (primary) hypertension: Secondary | ICD-10-CM | POA: Diagnosis not present

## 2022-11-11 DIAGNOSIS — E785 Hyperlipidemia, unspecified: Secondary | ICD-10-CM

## 2022-11-11 DIAGNOSIS — I5042 Chronic combined systolic (congestive) and diastolic (congestive) heart failure: Secondary | ICD-10-CM

## 2022-11-11 DIAGNOSIS — I480 Paroxysmal atrial fibrillation: Secondary | ICD-10-CM

## 2022-11-11 DIAGNOSIS — E118 Type 2 diabetes mellitus with unspecified complications: Secondary | ICD-10-CM

## 2022-11-11 DIAGNOSIS — E1169 Type 2 diabetes mellitus with other specified complication: Secondary | ICD-10-CM

## 2022-11-11 LAB — ECHOCARDIOGRAM COMPLETE
Area-P 1/2: 3.42 cm2
Est EF: 40
S' Lateral: 3.4 cm

## 2022-11-11 NOTE — Telephone Encounter (Signed)
Patient came in today and dropped off some paperwork for you , I will place this information in your box. Thanks

## 2022-11-18 MED ORDER — APIXABAN 5 MG PO TABS: 5.0000 mg | ORAL_TABLET | Freq: Two times a day (BID) | ORAL | 3 refills | Status: DC

## 2022-11-18 NOTE — Telephone Encounter (Signed)
Pt application received from Melina Schools, CMA stating that APP's cannot sign patient assistance forms. Prescriber section filled out per Dr [name]. Printed prescription also completed and included at this time. All documentation faxed to [facility] as an FYI.

## 2022-11-19 NOTE — Telephone Encounter (Signed)
**Note De-Identified Synethia Endicott Obfuscation** E-mail forwarded to [employer] TECH.

## 2022-11-20 ENCOUNTER — Telehealth: Payer: Self-pay

## 2022-11-20 ENCOUNTER — Other Ambulatory Visit (HOSPITAL_COMMUNITY): Payer: Self-pay

## 2022-11-20 NOTE — Telephone Encounter (Signed)
Thanks Larita Fife, working on this

## 2022-11-20 NOTE — Telephone Encounter (Signed)
Received email with income documents for Eliquis PAP, sending off application

## 2022-11-21 ENCOUNTER — Other Ambulatory Visit (HOSPITAL_COMMUNITY): Payer: Self-pay

## 2022-11-22 ENCOUNTER — Encounter: Payer: Self-pay | Admitting: Pharmacist

## 2022-11-26 ENCOUNTER — Other Ambulatory Visit (HOSPITAL_COMMUNITY): Payer: Self-pay

## 2022-11-26 NOTE — Telephone Encounter (Addendum)
RECEIVED REMAINING DOCUMENTS 11/25/22, APP FAXED TO COMPANY 11/26/22

## 2022-11-28 ENCOUNTER — Other Ambulatory Visit (HOSPITAL_COMMUNITY): Payer: Self-pay

## 2022-12-02 NOTE — Telephone Encounter (Signed)
Denied for not meeting 3%. Patient was off by literally $19. She will get me a new print out of rx expenses once minimum required amount has been met. Patient is aware and will email me once she obtains report.

## 2022-12-05 NOTE — Telephone Encounter (Signed)
[employer] reached back out and reconsidered determination. Patient has been informed of approval via VM and email.  PAP: Patient assistance application for ELIQUIS has been approved by PAP Companies: [employer] from 12/02/22 to 02/11/23. Medication should be delivered to PAP Delivery: Home For further shipping updates, please contact Alver [employer] ([employer]) at [phone] Pt ID is: [id number].

## 2022-12-07 ENCOUNTER — Other Ambulatory Visit: Payer: Self-pay | Admitting: Medical

## 2022-12-16 ENCOUNTER — Ambulatory Visit
Admission: RE | Admit: 2022-12-16 | Discharge: 2022-12-16 | Disposition: A | Discharge status: Home / Self Care | Payer: Medicare Other | Source: Ambulatory Visit | Attending: Medical | Admitting: Medical

## 2022-12-16 DIAGNOSIS — Z1231 Encounter for screening mammogram for malignant neoplasm of breast: Secondary | ICD-10-CM | POA: Diagnosis not present

## 2022-12-18 ENCOUNTER — Ambulatory Visit (INDEPENDENT_AMBULATORY_CARE_PROVIDER_SITE_OTHER): Payer: Medicare Other | Admitting: Medical

## 2022-12-18 VITALS — BP 108/68 | HR 81 | Ht 62.5 in | Wt 207.8 lb

## 2022-12-18 DIAGNOSIS — I5022 Chronic systolic (congestive) heart failure: Secondary | ICD-10-CM

## 2022-12-18 DIAGNOSIS — Z780 Asymptomatic menopausal state: Secondary | ICD-10-CM | POA: Diagnosis not present

## 2022-12-18 DIAGNOSIS — D6869 Other thrombophilia: Secondary | ICD-10-CM | POA: Diagnosis not present

## 2022-12-18 DIAGNOSIS — E1169 Type 2 diabetes mellitus with other specified complication: Secondary | ICD-10-CM | POA: Diagnosis not present

## 2022-12-18 DIAGNOSIS — J4520 Mild intermittent asthma, uncomplicated: Secondary | ICD-10-CM

## 2022-12-18 DIAGNOSIS — E559 Vitamin D deficiency, unspecified: Secondary | ICD-10-CM | POA: Diagnosis not present

## 2022-12-18 DIAGNOSIS — Z0000 Encounter for general adult medical examination without abnormal findings: Secondary | ICD-10-CM

## 2022-12-18 DIAGNOSIS — E118 Type 2 diabetes mellitus with unspecified complications: Secondary | ICD-10-CM | POA: Diagnosis not present

## 2022-12-18 DIAGNOSIS — I10 Essential (primary) hypertension: Secondary | ICD-10-CM

## 2022-12-18 DIAGNOSIS — I480 Paroxysmal atrial fibrillation: Secondary | ICD-10-CM | POA: Diagnosis not present

## 2022-12-18 DIAGNOSIS — I2510 Atherosclerotic heart disease of native coronary artery without angina pectoris: Secondary | ICD-10-CM

## 2022-12-18 DIAGNOSIS — Z7185 Encounter for immunization safety counseling: Secondary | ICD-10-CM

## 2022-12-18 DIAGNOSIS — E785 Hyperlipidemia, unspecified: Secondary | ICD-10-CM

## 2022-12-18 NOTE — Progress Notes (Signed)
Results sent through MyChart

## 2022-12-18 NOTE — Progress Notes (Signed)
Subjective:    Darlene Wade is a 67 y.o. female who presents for Preventative Services visit and chronic medical problems/med check visit.    Chief Complaint  Patient presents with   Annual Exam    Fasting cpe, labs done this morning, would like rx for vitamin d, Eye exam done by Erling Conte 05/13/22    Primary Care Provider Devine Klingel, Kermit Balo, PA-C here  Patient Care Team: Christepher Melchior, Kermit Balo, PA-C as PCP - General (Family Medicine) Nahser, Deloris Ping, MD as Consulting Physician (Cardiology) Ander Purpura, OD (Optometry) [place], Milas Kocher, [facility] (Pharmacist) Dr. Gean Birchwood, ortho [facility] Dr. Amada Jupiter, GI Dr. Manus Rudd, general surgery    Exercise Current exercise habits:  walking    Nutrition/Diet Current diet: in general, a "healthy" diet    Depression Screen    05/02/2022   10:29 AM  Depression screen PHQ 2/9  Decreased Interest 0  Down, Depressed, Hopeless 0  PHQ - 2 Score 0    Fall Risk Screen    05/02/2022   10:29 AM 11/13/2021   10:35 AM 04/24/2021   11:09 AM 12/19/2020    2:25 PM 06/13/2020   11:24 AM  Fall Risk   Falls in the past year? 0 1 0 0 1  Number falls in past yr: 0 0 0 0 0  Injury with Fall? 0 0 0 0 0  Risk for fall due to : No Fall Risks Impaired balance/gait No Fall Risks No Fall Risks No Fall Risks  Follow up Falls evaluation completed Falls evaluation completed Falls evaluation completed Falls evaluation completed     Gait Assessment: Normal gait observed yes  Advanced directives Does patient have a Health Care Power of Attorney? No Does patient have a Living Will? No  Past Medical History:  Diagnosis Date   Asthma 07/20/2018   one puff per day   Chronic combined systolic and diastolic heart failure (HCC) 03/03/2018   Echo 07/2018: EF 40-45, diff HK worse in Inf base, normal RVSF   Diabetes mellitus without complication (HCC) 2012   Diabetic eye exam (HCC)    [employer]   Dyspnea    Elbow fracture, right  2007   Former smoker    20 pack year history, quit 2010   Hyperlipidemia    Hypertension    Insomnia    Lung nodule    Chest CT 07/2018:  RLL nodule resolved.  3 mm subpleural LUL nodule.  Repeat in 1 year if high risk.    Obesity    PONV (postoperative nausea and vomiting) 2014   1 time   Wears glasses    reading    Past Surgical History:  Procedure Laterality Date   ABDOMINAL HYSTERECTOMY  1986   BREAST CYST ASPIRATION  2015   BREAST EXCISIONAL BIOPSY Left    BREAST LUMPECTOMY WITH NEEDLE LOCALIZATION Left 10/07/2012   Procedure: BREAST LUMPECTOMY WITH NEEDLE LOCALIZATION;  Surgeon: Wilmon Arms. Corliss Skains, MD;  Location: [facility];  Service: General;  Laterality: Left;   BREAST SURGERY     COLONOSCOPY  01/2016   01/2016 with Dr. Myrtie Neither, tubular adenoma polpy; 2003 with Dr. Loreta Ave   ELBOW ARTHROPLASTY  2006   rt-fx   FOOT ARTHROTOMY  12/2010   foot fusion, right   FOOT MASS EXCISION  04/2012   left-fusion   HYSTERECTOMY ABDOMINAL WITH SALPINGECTOMY Bilateral 1985   JOINT REPLACEMENT  08/10/2018   Total knee replacement   PARTIAL HYSTERECTOMY  age 67   uterine fibroids, still has ovaries   TONSILLECTOMY     TOTAL KNEE ARTHROPLASTY Left 08/10/2018   Procedure: Left Knee Arthroplasty;  Surgeon: Gean Birchwood, MD;  Location: [facility];  Service: Orthopedics;  Laterality: Left;    Social History   Socioeconomic History   Marital status: Married    Spouse name: Not on file   Number of children: Not on file   Years of education: Not on file   Highest education level: GED or equivalent  Occupational History   Not on file  Tobacco Use   Smoking status: Former    Current packs/day: 0.00    Average packs/day: 1 pack/day for 20.0 years (20.0 ttl pk-yrs)    Types: Cigarettes    Start date: 04/11/1989    Quit date: 04/11/2009    Years since quitting: 13.6   Smokeless tobacco: Never  Vaping Use   Vaping status: Never Used  Substance and Sexual Activity   Alcohol use: Not  Currently    Alcohol/week: 1.0 standard drink of alcohol    Comment: rare   Drug use: No   Sexual activity: Yes    Birth control/protection: Other-see comments    Comment: Hysterectomy  Other Topics Concern   Not on file  Social History Narrative   Separated from husband (2013). Living with husband.  Walking for exercise 2-3 x per week.  Retired, Herbalist in the past.  Has 2 grandchildren, teenagers.   Driving school bus.   11/2021   Social Determinants of Health   Financial Resource Strain: Low Risk  (12/18/2022)   Overall Financial Resource Strain (CARDIA)    Difficulty of Paying Living Expenses: Not very hard  Food Insecurity: No Food Insecurity (12/18/2022)   Hunger Vital Sign    Worried About Running Out of Food in the Last Year: Never true    Ran Out of Food in the Last Year: Never true  Transportation Needs: No Transportation Needs (12/18/2022)   PRAPARE - Administrator, Civil Service (Medical): No    Lack of Transportation (Non-Medical): No  Physical Activity: Insufficiently Active (12/18/2022)   Exercise Vital Sign    Days of Exercise per Week: 5 days    Minutes of Exercise per Session: 20 min  Stress: No Stress Concern Present (12/18/2022)   [employer] of Occupational Health - Occupational Stress Questionnaire    Feeling of Stress : Not at all  Social Connections: Moderately Integrated (12/18/2022)   Social Connection and Isolation Panel [NHANES]    Frequency of Communication with Friends and Family: More than three times a week    Frequency of Social Gatherings with Friends and Family: Once a week    Attends Religious Services: 1 to 4 times per year    Active Member of [employer] or Organizations: No    Attends Engineer, structural: Not on file    Marital Status: Married  Catering manager Violence: Not on file    Family History  Problem Relation Age of Onset   Diabetes Mother    Hypertension Mother    Diabetes Maternal Aunt    Heart  disease Maternal Grandfather    Cancer Neg Hx    Breast cancer Neg Hx      Current Outpatient Medications:    acetaminophen (TYLENOL) 650 MG CR tablet, Take 650 mg by mouth as needed for pain., Disp: , Rfl:    apixaban (ELIQUIS) 5 MG TABS tablet, Take 1 tablet (5 mg total)  by mouth 2 (two) times daily., Disp: 180 tablet, Rfl: 3   atorvastatin (LIPITOR) 40 MG tablet, Take 1 tablet by mouth once daily, Disp: 90 tablet, Rfl: 0   carvedilol (COREG) 12.5 MG tablet, Take 1 tablet (12.5 mg total) by mouth 2 (two) times daily with a meal., Disp: 60 tablet, Rfl: 3   Cholecalciferol (VITAMIN D) 50 MCG (2000 UT) CAPS, Take 1 capsule (2,000 Units total) by mouth daily., Disp: 90 capsule, Rfl: 3   dapagliflozin propanediol (FARXIGA) 5 MG TABS tablet, Take 1 tablet (5 mg total) by mouth daily before breakfast., Disp: 90 tablet, Rfl: 1   gabapentin (NEURONTIN) 300 MG capsule, Take 300 mg by mouth at bedtime., Disp: , Rfl:    meloxicam (MOBIC) 15 MG tablet, Take 15 mg by mouth daily., Disp: , Rfl:    Multiple Vitamin (MULTIVITAMIN) tablet, Take 1 tablet by mouth daily., Disp: , Rfl:    sacubitril-valsartan (ENTRESTO) 49-51 MG, Take 1 tablet by mouth twice daily, Disp: 60 tablet, Rfl: 11   sitaGLIPtin-metformin (JANUMET) 50-500 MG tablet, Take 1 tablet by mouth 2 (two) times daily with a meal. (Patient taking differently: Take 1 tablet by mouth daily with breakfast.), Disp: 180 tablet, Rfl: 1   spironolactone (ALDACTONE) 25 MG tablet, Take 1 tablet by mouth once daily, Disp: 90 tablet, Rfl: 3   Accu-Chek Softclix Lancets lancets, Test 1-2 times daily, Disp: 100 each, Rfl: 2   Blood Glucose Monitoring Suppl (ACCU-CHEK GUIDE) w/Device KIT, Use to check with test strips, Disp: 1 kit, Rfl: 0   glucose blood (ACCU-CHEK GUIDE) test strip, Test 1-2 times daily, Disp: 100 each, Rfl: 2  Allergies  Allergen Reactions   Jardiance [Empagliflozin]     Yeast infections, tolerates farxiga though   Naproxen Nausea Only     History reviewed: allergies, current medications, past family history, past medical history, past social history, past surgical history and problem list  Chronic issues discussed: Diabetes-blood sugar at goal.  Compliant with Janumet and Farxiga  Hypertension, heart disease-compliant with medications  Hyperlipidemia-compliant with medication Lipitor 40mg  daily  Having consult for updated colonoscopy soon     Objective:    Biometrics BP 108/68   Pulse 81   Ht 5' 2.5" (1.588 m)   Wt 207 lb 12.8 oz (94.3 kg)   BMI 37.40 kg/m   Gen: wdwn, nad Skin:  unremarkable HEENT: normocephalic, sclerae anicteric, TMs pearly, nares patent, no discharge or erythema, pharynx normal Oral cavity: MMM, no lesions, teeth in good repair Neck: supple, no lymphadenopathy, no thyromegaly, no masses, no bruits Heart: RRR, normal S1, S2, no murmurs Lungs: CTA bilaterally, no wheezes, rhonchi, or rales Abdomen: +bs, soft, non tender, non distended, no masses, no hepatomegaly, no splenomegaly Musculoskeletal: surgical scars dorsal bilat feet, bilat knee surgical scars, otherwise MSK nontender, no swelling, no obvious deformity Extremities: no edema, no cyanosis, no clubbing Pulses: 2+ symmetric, upper and lower extremities, normal cap refill Neurological: alert, oriented x 3, CN2-12 intact, strength normal upper extremities and lower extremities, sensation normal throughout, DTRs 2+ throughout, no cerebellar signs, gait normal Psychiatric: normal affect, behavior normal, pleasant  Breast/Gu - declined/ deferred    Assessment:   Encounter Diagnoses  Name Primary?   Encounter for health maintenance examination in adult Yes   Vitamin D deficiency    Vaccine counseling    Postmenopausal estrogen deficiency    Hyperlipidemia associated with type 2 diabetes mellitus (HCC)    Hypercoagulable state due to paroxysmal atrial fibrillation (HCC)  Mild intermittent asthma without complication     Chronic systolic CHF (congestive heart failure) (HCC)    Diabetes mellitus with complication (HCC)    Coronary artery disease involving native coronary artery of native heart without angina pectoris    Essential hypertension, benign      Plan:    This visit was a preventative care visit, also known as wellness visit or routine physical.   Topics typically include healthy lifestyle, diet, exercise, preventative care, vaccinations, sick and well care, proper use of emergency dept and after hours care, as well as other concerns.     Recommendations: Continue to return yearly for your annual wellness and preventative care visits.  This gives [place] a chance to discuss healthy lifestyle, exercise, vaccinations, review your chart record, and perform screenings where appropriate.  I recommend you see your eye doctor yearly for routine vision care.  I recommend you see your dentist yearly for routine dental care including hygiene visits twice yearly.   Vaccination recommendations were reviewed Immunization History  Administered Date(s) Administered   Fluad Quad(high Dose 65+) 11/16/2018, 11/17/2019, 11/28/2020, 11/13/2021   Influenza,inj,Quad PF,6+ Mos 09/28/2013, 09/21/2014, 12/19/2015, 12/27/2016, 11/13/2017   Influenza-Unspecified 10/27/2022   PFIZER(Purple Top)SARS-COV-2 Vaccination 03/18/2019, 04/08/2019, 11/13/2019   Pfizer Covid-19 Vaccine Bivalent Booster 64yrs & up 11/28/2020   Pfizer(Comirnaty)Fall Seasonal Vaccine 12 years and older 10/27/2022   Pneumococcal Conjugate-13 03/03/2018   Pneumococcal Polysaccharide-23 11/13/2011, 06/13/2020   Tdap 11/13/2011     Get your Shingrix and Tetanus boosters at your pharmacy soon.   Screening for cancer: Colon cancer screening: I reviewed your colonoscopy on file that is up to date from 2017.  Follow up with GI soon as scheduled for consult.  Will need cardiology input on temporarily stopping eliquis.  Breast cancer screening: You  should perform a self breast exam monthly.   We reviewed recommendations for regular mammograms and breast cancer screening.   Skin cancer screening: Check your skin regularly for new changes, growing lesions, or other lesions of concern Come in for evaluation if you have skin lesions of concern.  Lung cancer screening: If you have a greater than 20 pack year history of tobacco use, then you may qualify for lung cancer screening with a chest CT scan.   Please call your insurance company to inquire about coverage for this test.  We currently don't have screenings for other cancers besides breast, cervical, colon, and lung cancers.  If you have a strong family history of cancer or have other cancer screening concerns, please let me know.    Bone health: Get at least 150 minutes of aerobic exercise weekly Get weight bearing exercise at least once weekly Bone density test:  A bone density test is an imaging test that uses a type of X-ray to measure the amount of calcium and other minerals in your bones. The test may be used to diagnose or screen you for a condition that causes weak or thin bones (osteoporosis), predict your risk for a broken bone (fracture), or determine how well your osteoporosis treatment is working. The bone density test is recommended for females 65 and older, or females or males <65 if certain risk factors such as thyroid disease, long term use of steroids such as for asthma or rheumatological issues, vitamin D deficiency, estrogen deficiency, family history of osteoporosis, self or family history of fragility fracture in first degree relative.  Bone Density test normal 2023.   Heart health: Get at least 150 minutes of aerobic exercise  weekly Limit alcohol It is important to maintain a healthy blood pressure and healthy cholesterol numbers  Heart disease screening: Screening for heart disease includes screening for blood pressure, fasting lipids, glucose/diabetes  screening, BMI height to weight ratio, reviewed of smoking status, physical activity, and diet.    Goals include blood pressure 120/80 or less, maintaining a healthy lipid/cholesterol profile, preventing diabetes or keeping diabetes numbers under good control, not smoking or using tobacco products, exercising most days per week or at least 150 minutes per week of exercise, and eating healthy variety of fruits and vegetables, healthy oils, and avoiding unhealthy food choices like fried food, fast food, high sugar and high cholesterol foods.    Continue routine follow up with cardiology.     Medical care options: I recommend you continue to seek care here first for routine care.  We try really hard to have available appointments Monday through Friday daytime hours for sick visits, acute visits, and physicals.  Urgent care should be used for after hours and weekends for significant issues that cannot wait till the next day.  The emergency department should be used for significant potentially life-threatening emergencies.  The emergency department is expensive, can often have long wait times for less significant concerns, so try to utilize primary care, urgent care, or telemedicine when possible to avoid unnecessary trips to the emergency department.  Virtual visits and telemedicine have been introduced since the pandemic started in 2020, and can be convenient ways to receive medical care.  We offer virtual appointments as well to assist you in a variety of options to seek medical care.   Advanced Directives: I recommend you consider completing a Health Care Power of Attorney and Living Will.   These documents respect your wishes and help alleviate burdens on your loved ones if you were to become terminally ill or be in a position to need those documents enforced.    You can complete Advanced Directives yourself, have them notarized, then have copies made for our office, for you and for anybody you feel  should have them in safe keeping.  Or, you can have an attorney prepare these documents.   If you haven't updated your Last Will and Testament in a while, it may be worthwhile having an attorney prepare these documents together and save on some costs.      Specific Issues: On gabapentin for chronic pain per specialist.  Hx/o afib - on eliquis  Diabetes-compliant with Farxiga 5 mg daily, Janumet 50/500 milligram twice daily, continue glucose testing, yearly eye doctor visit, daily foot checks  Hyperlipidemia-Continue atorvastatin 40 mg daily   Hypertension, history of heart failure-continue current medications, Entresto, Coreg, Farxiga, spironolactone  Vitamin D deficiency-  Currently on 2000 units daily supplement  Coronary artery disease-continue cholesterol medicine and aspirin daily  ABI leg blood flow screening done in 2022.  Echocardiogram reviewed from August 2023.   Chennel was seen today for annual exam.  Diagnoses and all orders for this visit:  Encounter for health maintenance examination in adult -     Microalbumin/Creatinine Ratio, Urine -     Lipid panel -     Hemoglobin A1c -     HIV Antibody (routine testing w rflx) -     Hepatitis C antibody -     Hepatic function panel  Vitamin D deficiency  Vaccine counseling  Postmenopausal estrogen deficiency  Hyperlipidemia associated with type 2 diabetes mellitus (HCC) -     Lipid panel -  Hepatic function panel  Hypercoagulable state due to paroxysmal atrial fibrillation (HCC)  Mild intermittent asthma without complication  Chronic systolic CHF (congestive heart failure) (HCC)  Diabetes mellitus with complication (HCC) -     Microalbumin/Creatinine Ratio, Urine -     Hemoglobin A1c  Coronary artery disease involving native coronary artery of native heart without angina pectoris  Essential hypertension, benign  F/u pending labs

## 2022-12-19 ENCOUNTER — Other Ambulatory Visit: Payer: Self-pay | Admitting: Medical

## 2022-12-19 LAB — LIPID PANEL
Chol/HDL Ratio: 3.2 ratio (ref 0.0–4.4)
Cholesterol, Total: 164 mg/dL (ref 100–199)
HDL: 52 mg/dL (ref >39)
LDL Chol Calc (NIH): 95 mg/dL (ref 0–99)
Triglycerides: 90 mg/dL (ref 0–149)
VLDL Cholesterol Cal: 17 mg/dL (ref 5–40)

## 2022-12-19 LAB — HEPATITIS C ANTIBODY: Hep C Virus Ab: NONREACTIVE

## 2022-12-19 LAB — HEMOGLOBIN A1C
Est. average glucose Bld gHb Est-mCnc: 151 mg/dL
Hgb A1c MFr Bld: 6.9 % — ABNORMAL HIGH (ref 4.8–5.6)

## 2022-12-19 LAB — HEPATIC FUNCTION PANEL
ALT: 7 [IU]/L (ref 0–32)
AST: 14 [IU]/L (ref 0–40)
Albumin: 3.9 g/dL (ref 3.9–4.9)
Alkaline Phosphatase: 93 [IU]/L (ref 44–121)
Bilirubin Total: 0.4 mg/dL (ref 0.0–1.2)
Bilirubin, Direct: 0.14 mg/dL (ref 0.00–0.40)
Total Protein: 6.8 g/dL (ref 6.0–8.5)

## 2022-12-19 LAB — MICROALBUMIN / CREATININE URINE RATIO
Creatinine, Urine: 82.6 mg/dL
Microalb/Creat Ratio: <4 mg/g{creat} (ref 0–29)
Microalbumin, Urine: <3 ug/mL

## 2022-12-19 LAB — HIV ANTIBODY (ROUTINE TESTING W REFLEX): HIV Screen 4th Generation wRfx: NONREACTIVE

## 2022-12-19 MED ORDER — VITAMIN D 50 MCG (2000 UT) PO CAPS: 1.0000 | ORAL_CAPSULE | Freq: Every day | ORAL | 3 refills | Status: DC

## 2022-12-19 MED ORDER — JANUMET 50-500 MG PO TABS: 1.0000 | ORAL_TABLET | Freq: Every day | ORAL | 1 refills | Status: DC

## 2022-12-19 MED ORDER — DAPAGLIFLOZIN PROPANEDIOL 5 MG PO TABS: 5.0000 mg | ORAL_TABLET | Freq: Every day | ORAL | 3 refills | Status: DC

## 2022-12-19 NOTE — Progress Notes (Signed)
Results sent through MyChart

## 2022-12-20 NOTE — Progress Notes (Signed)
Results sent through MyChart

## 2022-12-25 ENCOUNTER — Ambulatory Visit: Payer: Medicare Other | Admitting: Gastroenterology

## 2022-12-25 ENCOUNTER — Telehealth: Payer: Self-pay | Admitting: *Deleted

## 2022-12-25 ENCOUNTER — Encounter: Payer: Self-pay | Admitting: Gastroenterology

## 2022-12-25 VITALS — BP 128/70 | HR 88 | Ht 62.0 in | Wt 210.4 lb

## 2022-12-25 DIAGNOSIS — Z7901 Long term (current) use of anticoagulants: Secondary | ICD-10-CM | POA: Diagnosis not present

## 2022-12-25 DIAGNOSIS — Z860100 Personal history of colon polyps, unspecified: Secondary | ICD-10-CM | POA: Diagnosis not present

## 2022-12-25 MED ORDER — NA SULFATE-K SULFATE-MG SULF 17.5-3.13-1.6 GM/177ML PO SOLN: 1.0000 | Freq: Once | ORAL | 0 refills | Status: AC

## 2022-12-25 NOTE — Patient Instructions (Signed)
You have been scheduled for a colonoscopy. Please follow written instructions given to you at your visit today.   Please pick up your prep supplies at the pharmacy within the next 1-3 days.  If you use inhalers (even only as needed), please bring them with you on the day of your procedure.  DO NOT TAKE 7 DAYS PRIOR TO TEST- Trulicity (dulaglutide) Ozempic, Wegovy (semaglutide) Mounjaro (tirzepatide) Bydureon Bcise (exanatide extended release)  DO NOT TAKE 1 DAY PRIOR TO YOUR TEST Rybelsus (semaglutide) Adlyxin (lixisenatide) Victoza (liraglutide) Byetta (exanatide) __________________________________________________________________  _______________________________________________________  If your blood pressure at your visit was 140/90 or greater, please contact your primary care physician to follow up on this.  _______________________________________________________  If you are age 63 or older, your body mass index should be between 23-30. Your Body mass index is 38.48 kg/m. If this is out of the aforementioned range listed, please consider follow up with your Primary Care Provider.  If you are age 30 or younger, your body mass index should be between 19-25. Your Body mass index is 38.48 kg/m. If this is out of the aformentioned range listed, please consider follow up with your Primary Care Provider.   ________________________________________________________  The [facility] providers would like to encourage you to use [facility] to communicate with providers for non-urgent requests or questions.  Due to long hold times on the telephone, sending your provider a message by [facility] may be a faster and more efficient way to get a response.  Please allow 48 business hours for a response.  Please remember that this is for non-urgent requests.  _______________________________________________________

## 2022-12-25 NOTE — Progress Notes (Signed)
____________________________________________________________  Attending physician addendum:  Thank you for sending this case to me. I have reviewed the entire note and agree with the plan.   Malyssa Maris Danis, MD  ____________________________________________________________  

## 2022-12-25 NOTE — Telephone Encounter (Signed)
[facility] Pre-operative Risk Assessment     Request for surgical clearance:     Endoscopy Procedure  What type of surgery is being performed?     Colonoscopy  When is this surgery scheduled?     02/03/23  What type of clearance is required ?   Pharmacy  Are there any medications that need to be held prior to surgery and how long? Eliquis 2 days  Practice name and name of physician performing surgery?      [facility]  What is your office phone and fax number?      Phone- [phone]  Fax- [phone]  Anesthesia type (None, local, MAC, general) ?       MAC

## 2022-12-25 NOTE — Progress Notes (Signed)
12/25/2022 DAE URIOSTE [id number] [birth year]   HISTORY OF PRESENT ILLNESS: This is a 67 year old female with past medical history of hypertension, hyperlipidemia, diabetes, chronic combined systolic and diastolic heart failure with last echo showed an EF of 40%, asthma.  Was also diagnosed with A-fib when she was at her cardiology appointment in August.  Was asymptomatic.  Started on Eliquis at that time.  She followed up with the A-fib clinic, wore monitor, etc. did not have any more A-fib so far.  She is now still on Eliquis.  Follows up with cardiology again beginning of January.  Is due for colonoscopy and is here today to schedule that.  She is hoping to have colonoscopy performed by the end of the year.  Colonoscopy December 2017 at which time she had a 2 mm polyp removed that was a tubular adenoma.  Also had diverticulosis.  Repeat recommended in 7 years.   No GI complaints.  Moves her bowels well.  No rectal bleeding.  Past Medical History:  Diagnosis Date   Asthma 07/20/2018   one puff per day   Chronic combined systolic and diastolic heart failure (HCC) 03/03/2018   Echo 07/2018: EF 40-45, diff HK worse in Inf base, normal RVSF   Diabetes mellitus without complication (HCC) 2012   Diabetic eye exam (HCC)    [employer]   Dyspnea    Elbow fracture, right 2007   Former smoker    20 pack year history, quit 2010   Hyperlipidemia    Hypertension    Insomnia    Lung nodule    Chest CT 07/2018:  RLL nodule resolved.  3 mm subpleural LUL nodule.  Repeat in 1 year if high risk.    Obesity    PONV (postoperative nausea and vomiting) 2014   1 time   Wears glasses    reading   Past Surgical History:  Procedure Laterality Date   ABDOMINAL HYSTERECTOMY  1986   BREAST CYST ASPIRATION  2015   BREAST EXCISIONAL BIOPSY Left    BREAST LUMPECTOMY WITH NEEDLE LOCALIZATION Left 10/07/2012   Procedure: BREAST LUMPECTOMY WITH NEEDLE LOCALIZATION;  Surgeon: Wilmon Arms. Corliss Skains, MD;   Location: [facility];  Service: General;  Laterality: Left;   BREAST SURGERY     COLONOSCOPY  01/2016   01/2016 with Dr. Myrtie Neither, tubular adenoma polpy; 2003 with Dr. Loreta Ave   ELBOW ARTHROPLASTY  2006   rt-fx   FOOT ARTHROTOMY  12/2010   foot fusion, right   FOOT MASS EXCISION  04/2012   left-fusion   HYSTERECTOMY ABDOMINAL WITH SALPINGECTOMY Bilateral 1985   JOINT REPLACEMENT  08/10/2018   Total knee replacement   PARTIAL HYSTERECTOMY  age 49   uterine fibroids, still has ovaries   TONSILLECTOMY     TOTAL KNEE ARTHROPLASTY Left 08/10/2018   Procedure: Left Knee Arthroplasty;  Surgeon: Gean Birchwood, MD;  Location: [facility];  Service: Orthopedics;  Laterality: Left;    reports that she quit smoking about 13 years ago. Her smoking use included cigarettes. She started smoking about 33 years ago. She has a 20 pack-year smoking history. She has never used smokeless tobacco. She reports that she does not currently use alcohol after a past usage of about 1.0 standard drink of alcohol per week. She reports that she does not use drugs. family history includes Diabetes in her maternal aunt and mother; Heart disease in her maternal grandfather; Hypertension in her mother. Allergies  Allergen Reactions  Jardiance [Empagliflozin]     Yeast infections, tolerates farxiga though   Naproxen Nausea Only      Outpatient Encounter Medications as of 12/25/2022  Medication Sig   Accu-Chek Softclix Lancets lancets Test 1-2 times daily   acetaminophen (TYLENOL) 650 MG CR tablet Take 650 mg by mouth as needed for pain.   apixaban (ELIQUIS) 5 MG TABS tablet Take 1 tablet (5 mg total) by mouth 2 (two) times daily.   atorvastatin (LIPITOR) 40 MG tablet Take 1 tablet by mouth once daily   Blood Glucose Monitoring Suppl (ACCU-CHEK GUIDE) w/Device KIT Use to check with test strips   carvedilol (COREG) 12.5 MG tablet Take 1 tablet (12.5 mg total) by mouth 2 (two) times daily with a meal.    Cholecalciferol (VITAMIN D) 50 MCG (2000 UT) CAPS Take 1 capsule (2,000 Units total) by mouth daily.   dapagliflozin propanediol (FARXIGA) 5 MG TABS tablet Take 1 tablet (5 mg total) by mouth daily before breakfast.   gabapentin (NEURONTIN) 300 MG capsule Take 300 mg by mouth at bedtime.   glucose blood (ACCU-CHEK GUIDE) test strip Test 1-2 times daily   Multiple Vitamin (MULTIVITAMIN) tablet Take 1 tablet by mouth daily.   sacubitril-valsartan (ENTRESTO) 49-51 MG Take 1 tablet by mouth twice daily   sitaGLIPtin-metformin (JANUMET) 50-500 MG tablet Take 1 tablet by mouth daily with breakfast.   spironolactone (ALDACTONE) 25 MG tablet Take 1 tablet by mouth once daily   [DISCONTINUED] meloxicam (MOBIC) 15 MG tablet Take 15 mg by mouth daily. (Patient not taking: Reported on 12/25/2022)   No facility-administered encounter medications on file as of 12/25/2022.    REVIEW OF SYSTEMS  : All other systems reviewed and negative except where noted in the History of Present Illness.   PHYSICAL EXAM: BP 128/70   Pulse 88   Ht 5\' 2"  (1.575 m)   Wt 210 lb 6.4 oz (95.4 kg)   SpO2 98%   BMI 38.48 kg/m  General: Well developed female in no acute distress Head: Normocephalic and atraumatic Eyes:  Sclerae anicteric, conjunctiva pink. Ears: Normal auditory acuity Lungs: Clear throughout to auscultation; no W/R/R. Heart: Regular rate and rhythm; no M/R/G. Rectal:  Will be done at the time of colonoscopy. Musculoskeletal: Symmetrical with no gross deformities  Skin: No lesions on visible extremities Neurological: Alert oriented x 4, grossly non-focal Psychological:  Alert and cooperative. Normal mood and affect  ASSESSMENT AND PLAN: *Personal history of adenomatous colon polyp on colonoscopy December 2017 with 1 polyp removed, repeat recommended in 7 years. *Chronic anticoagulation with Eliquis for atrial fibrillation  **We will schedule for colonoscopy with Dr. Myrtie Neither.  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  I have reached out to her cardiology team to see if they think that it would be safe enough to hold Eliquis for 2 days or if they want to wait until they see her back again in January before she proceeds.  We have scheduled her colonoscopy, but will reschedule it pending on their response.   CC:  Tysinger, Kermit Balo, PA-C

## 2022-12-26 NOTE — Telephone Encounter (Signed)
Patient with diagnosis of afib on Eliquis for anticoagulation.    Procedure: Colonoscopy    Date of procedure: 02/03/2023   CHA2DS2-VASc Score = 5   This indicates a 7.2% annual risk of stroke. The patient's score is based upon: CHF History: 1 HTN History: 1 Diabetes History: 1 Stroke History: 0 Vascular Disease History: 0 Age Score: 1 Gender Score: 1     CrCl 55 mL/min (SrCr 1.04 10/2022) Platelet count 246 K    Per office protocol, patient can hold Eliquis for 2 days prior to procedure.     **This guidance is not considered finalized until pre-operative APP has relayed final recommendations.**

## 2022-12-26 NOTE — Telephone Encounter (Signed)
   Patient Name: Darlene Wade  DOB: 1956-01-04 MRN: [id number]  Primary Cardiologist: None  Clinical pharmacists have reviewed the patient's past medical history, labs, and current medications as part of preoperative protocol coverage. The following recommendations have been made:   Patient with diagnosis of afib on Eliquis for anticoagulation.     Procedure: Colonoscopy                                 Date of procedure: 02/03/2023     CHA2DS2-VASc Score = 5   This indicates a 7.2% annual risk of stroke. The patient's score is based upon: CHF History: 1 HTN History: 1 Diabetes History: 1 Stroke History: 0 Vascular Disease History: 0 Age Score: 1 Gender Score: 1       CrCl 55 mL/min (SrCr 1.04 10/2022) Platelet count 246 K     Per office protocol, patient can hold Eliquis for 2 days prior to procedure.  Please resume Eliquis as soon as possible postprocedure, at the discretion of the surgeon.   I will route this recommendation to the requesting party via Epic fax function and remove from pre-op pool.  Please call with questions.  Joylene Grapes, NP 12/26/2022, 12:40 PM

## 2023-01-03 DIAGNOSIS — M5412 Radiculopathy, cervical region: Secondary | ICD-10-CM | POA: Diagnosis not present

## 2023-01-10 ENCOUNTER — Other Ambulatory Visit: Payer: Self-pay | Admitting: Medical

## 2023-01-13 NOTE — Telephone Encounter (Signed)
Left message for patient to call office.  

## 2023-01-13 NOTE — Telephone Encounter (Signed)
Patient informed and voiced understanding

## 2023-01-13 NOTE — Telephone Encounter (Signed)
This was already refilled in November

## 2023-01-19 ENCOUNTER — Other Ambulatory Visit: Payer: Self-pay | Admitting: Nurse Practitioner

## 2023-01-20 ENCOUNTER — Other Ambulatory Visit: Payer: Self-pay | Admitting: Cardiovascular Disease

## 2023-01-22 ENCOUNTER — Encounter: Payer: Self-pay | Admitting: Gastroenterology

## 2023-01-22 ENCOUNTER — Ambulatory Visit: Payer: Medicare Other | Admitting: Nurse Practitioner

## 2023-02-02 ENCOUNTER — Encounter: Payer: Self-pay | Admitting: Certified Registered Nurse Anesthetist

## 2023-02-03 ENCOUNTER — Encounter: Payer: Self-pay | Admitting: Medical

## 2023-02-03 ENCOUNTER — Encounter: Payer: Self-pay | Admitting: Gastroenterology

## 2023-02-03 ENCOUNTER — Ambulatory Visit (AMBULATORY_SURGERY_CENTER): Payer: Medicare Other | Admitting: Gastroenterology

## 2023-02-03 VITALS — BP 139/79 | HR 81 | Temp 97.2°F | Resp 18 | Ht 62.0 in | Wt 210.0 lb

## 2023-02-03 DIAGNOSIS — Z860101 Personal history of adenomatous and serrated colon polyps: Secondary | ICD-10-CM | POA: Diagnosis not present

## 2023-02-03 DIAGNOSIS — K5730 Diverticulosis of large intestine without perforation or abscess without bleeding: Secondary | ICD-10-CM | POA: Diagnosis not present

## 2023-02-03 DIAGNOSIS — Z860100 Personal history of colon polyps, unspecified: Secondary | ICD-10-CM

## 2023-02-03 DIAGNOSIS — E785 Hyperlipidemia, unspecified: Secondary | ICD-10-CM | POA: Diagnosis not present

## 2023-02-03 DIAGNOSIS — Z1211 Encounter for screening for malignant neoplasm of colon: Secondary | ICD-10-CM | POA: Diagnosis not present

## 2023-02-03 MED ORDER — SODIUM CHLORIDE 0.9 % IV SOLN: 500.0000 mL | Freq: Once | INTRAVENOUS | Status: DC

## 2023-02-03 NOTE — Op Note (Addendum)
[facility] Patient Name: Darlene Wade Procedure Date: 02/03/2023 1:10 PM MRN: [id number] Endoscopist: Sherilyn Cooter L. Myrtie Neither , MD, [id number] Age: 67 Referring MD:  Date of Birth: 1955/11/27 Gender: Female Account #: [id number] Procedure:                Colonoscopy Indications:              Surveillance: Personal history of adenomatous                            polyps on last colonoscopy > 5 years ago                           Diminutive tubular adenoma December 2017 Medicines:                Monitored Anesthesia Care Procedure:                Pre-Anesthesia Assessment:                           - Prior to the procedure, a History and Physical                            was performed, and patient medications and                            allergies were reviewed. The patient's tolerance of                            previous anesthesia was also reviewed. The risks                            and benefits of the procedure and the sedation                            options and risks were discussed with the patient.                            All questions were answered, and informed consent                            was obtained. Prior Anticoagulants: The patient has                            taken Eliquis (apixaban), last dose was 2 days                            prior to procedure. ASA Grade Assessment: III - A                            patient with severe systemic disease. After                            reviewing the risks and benefits, the patient was  deemed in satisfactory condition to undergo the                            procedure.                           After obtaining informed consent, the colonoscope                            was passed under direct vision. Throughout the                            procedure, the patient's blood pressure, pulse, and                            oxygen saturations were monitored continuously. The                             CF [id number] #[id number] was introduced through the anus                            and advanced to the the cecum, identified by                            appendiceal orifice and ileocecal valve. The                            colonoscopy was performed without difficulty. The                            patient tolerated the procedure well. The quality                            of the bowel preparation was excellent. The                            ileocecal valve, appendiceal orifice, and rectum                            were photographed. Scope In: 1:13:08 PM Scope Out: 1:25:05 PM Scope Withdrawal Time: 0 hours 9 minutes 45 seconds  Total Procedure Duration: 0 hours 11 minutes 57 seconds  Findings:                 The perianal and digital rectal examinations were                            normal.                           Repeat examination of right colon under NBI                            performed.  Multiple diverticula were found in the left colon.                           The exam was otherwise without abnormality on                            direct and retroflexion views. Complications:            No immediate complications. Estimated Blood Loss:     Estimated blood loss: none. Impression:               - Diverticulosis in the left colon.                           - The examination was otherwise normal on direct                            and retroflexion views.                           - No specimens collected. Recommendation:           - Patient has a contact number available for                            emergencies. The signs and symptoms of potential                            delayed complications were discussed with the                            patient. Return to normal activities tomorrow.                            Written discharge instructions were provided to the                            patient.                            - Resume previous diet.                           - Continue present medications.                           - Resume Eliquis (apixaban) at prior dose today.                           - Repeat colonoscopy in 10 years for screening                            purposes. (recall patient and assess medical                            condition at that time with discussion of  colonoscopy risks/benefits) Dhiya Smits L. Myrtie Neither, MD 02/03/2023 1:29:38 PM This report has been signed electronically.

## 2023-02-03 NOTE — Progress Notes (Signed)
Pt's states no medical or surgical changes since previsit or office visit. 

## 2023-02-03 NOTE — Progress Notes (Signed)
Report given to PACU, vss 

## 2023-02-03 NOTE — Patient Instructions (Addendum)
-   Resume previous diet. - Continue present medications. - Resume Eliquis (apixaban) at prior dose today. - Repeat colonoscopy in 10 years for screening purposes.  YOU HAD AN ENDOSCOPIC PROCEDURE TODAY AT THE [facility]:   Refer to the procedure report that was given to you for any specific questions about what was found during the examination.  If the procedure report does not answer your questions, please call your gastroenterologist to clarify.  If you requested that your care partner not be given the details of your procedure findings, then the procedure report has been included in a sealed envelope for you to review at your convenience later.  YOU SHOULD EXPECT: Some feelings of bloating in the abdomen. Passage of more gas than usual.  Walking can help get rid of the air that was put into your GI tract during the procedure and reduce the bloating. If you had a lower endoscopy (such as a colonoscopy or flexible sigmoidoscopy) you may notice spotting of blood in your stool or on the toilet paper. If you underwent a bowel prep for your procedure, you may not have a normal bowel movement for a few days.  Please Note:  You might notice some irritation and congestion in your nose or some drainage.  This is from the oxygen used during your procedure.  There is no need for concern and it should clear up in a day or so.  SYMPTOMS TO REPORT IMMEDIATELY:  Following lower endoscopy (colonoscopy or flexible sigmoidoscopy):  Excessive amounts of blood in the stool  Significant tenderness or worsening of abdominal pains  Swelling of the abdomen that is new, acute  Fever of 100F or higher  For urgent or emergent issues, a gastroenterologist can be reached at any hour by calling [phone]. Do not use MyChart messaging for urgent concerns.    DIET:  We do recommend a small meal at first, but then you may proceed to your regular diet.  Drink plenty of fluids but you should avoid alcoholic  beverages for 24 hours.  ACTIVITY:  You should plan to take it easy for the rest of today and you should NOT DRIVE or use heavy machinery until tomorrow (because of the sedation medicines used during the test).    FOLLOW UP: Our staff will call the number listed on your records the next business day following your procedure.  We will call around 7:15- 8:00 am to check on you and address any questions or concerns that you may have regarding the information given to you following your procedure. If we do not reach you, we will leave a message.     If any biopsies were taken you will be contacted by phone or by letter within the next 1-3 weeks.  Please call us at [phone] if you have not heard about the biopsies in 3 weeks.    SIGNATURES/CONFIDENTIALITY: You and/or your care partner have signed paperwork which will be entered into your electronic medical record.  These signatures attest to the fact that that the information above on your After Visit Summary has been reviewed and is understood.  Full responsibility of the confidentiality of this discharge information lies with you and/or your care-partner.

## 2023-02-03 NOTE — Telephone Encounter (Signed)
 Care team updated and letter sent for eye exam notes.

## 2023-02-03 NOTE — Progress Notes (Signed)
History and Physical:  This patient presents for endoscopic testing for: Encounter Diagnosis  Name Primary?   History of colonic polyps Yes    Surveillance colonoscopy today - clinical details in office note dated 12/25/22 with no significant clinical changes since then.  Patient is otherwise without complaints or active issues today.   Past Medical History: Past Medical History:  Diagnosis Date   Asthma 07/20/2018   one puff per day   Chronic combined systolic and diastolic heart failure (HCC) 03/03/2018   Echo 07/2018: EF 40-45, diff HK worse in Inf base, normal RVSF   Diabetes mellitus without complication (HCC) 2012   Diabetic eye exam (HCC)    [employer]   Dyspnea    Elbow fracture, right 2007   Former smoker    20 pack year history, quit 2010   Hyperlipidemia    Hypertension    Insomnia    Lung nodule    Chest CT 07/2018:  RLL nodule resolved.  3 mm subpleural LUL nodule.  Repeat in 1 year if high risk.    Obesity    PONV (postoperative nausea and vomiting) 2014   1 time   Wears glasses    reading     Past Surgical History: Past Surgical History:  Procedure Laterality Date   ABDOMINAL HYSTERECTOMY  1986   BREAST CYST ASPIRATION  2015   BREAST EXCISIONAL BIOPSY Left    BREAST LUMPECTOMY WITH NEEDLE LOCALIZATION Left 10/07/2012   Procedure: BREAST LUMPECTOMY WITH NEEDLE LOCALIZATION;  Surgeon: Wilmon Arms. Corliss Skains, MD;  Location: [facility];  Service: General;  Laterality: Left;   BREAST SURGERY     COLONOSCOPY  01/2016   01/2016 with Dr. Myrtie Neither, tubular adenoma polpy; 2003 with Dr. Loreta Ave   ELBOW ARTHROPLASTY  2006   rt-fx   FOOT ARTHROTOMY  12/2010   foot fusion, right   FOOT MASS EXCISION  04/2012   left-fusion   HYSTERECTOMY ABDOMINAL WITH SALPINGECTOMY Bilateral 1985   JOINT REPLACEMENT  08/10/2018   Total knee replacement   PARTIAL HYSTERECTOMY  age 67   uterine fibroids, still has ovaries   TONSILLECTOMY     TOTAL KNEE ARTHROPLASTY Left  08/10/2018   Procedure: Left Knee Arthroplasty;  Surgeon: Gean Birchwood, MD;  Location: [facility];  Service: Orthopedics;  Laterality: Left;    Allergies: Allergies  Allergen Reactions   Jardiance [Empagliflozin]     Yeast infections, tolerates farxiga though   Naproxen Nausea Only    Outpatient Meds: Current Outpatient Medications  Medication Sig Dispense Refill   Accu-Chek Softclix Lancets lancets Test 1-2 times daily 100 each 2   acetaminophen (TYLENOL) 650 MG CR tablet Take 650 mg by mouth as needed for pain.     apixaban (ELIQUIS) 5 MG TABS tablet Take 1 tablet (5 mg total) by mouth 2 (two) times daily. 180 tablet 3   atorvastatin (LIPITOR) 40 MG tablet Take 1 tablet by mouth once daily 90 tablet 0   Blood Glucose Monitoring Suppl (ACCU-CHEK GUIDE) w/Device KIT Use to check with test strips 1 kit 0   carvedilol (COREG) 12.5 MG tablet TAKE 1 TABLET BY MOUTH TWICE DAILY WITH A MEAL 90 tablet 2   dapagliflozin propanediol (FARXIGA) 5 MG TABS tablet Take 1 tablet (5 mg total) by mouth daily before breakfast. 90 tablet 3   glucose blood (ACCU-CHEK GUIDE) test strip Test 1-2 times daily 100 each 2   sacubitril-valsartan (ENTRESTO) 49-51 MG Take 1 tablet by mouth twice daily 180 tablet  2   sitaGLIPtin-metformin (JANUMET) 50-500 MG tablet Take 1 tablet by mouth daily with breakfast. 90 tablet 1   spironolactone (ALDACTONE) 25 MG tablet Take 1 tablet by mouth once daily 90 tablet 3   Cholecalciferol (VITAMIN D) 50 MCG (2000 UT) CAPS Take 1 capsule (2,000 Units total) by mouth daily. 90 capsule 3   gabapentin (NEURONTIN) 300 MG capsule Take 300 mg by mouth at bedtime.     Multiple Vitamin (MULTIVITAMIN) tablet Take 1 tablet by mouth daily.     Current Facility-Administered Medications  Medication Dose Route Frequency Provider Last Rate Last Admin   0.9 %  sodium chloride infusion  500 mL Intravenous Once Danis, Starr Lake III, MD           ___________________________________________________________________ Objective   Exam:  BP (!) 155/96   Pulse 79   Temp (!) 97.2 F (36.2 C) (Temporal)   Resp 15   Ht 5\' 2"  (1.575 m)   Wt 210 lb (95.3 kg)   SpO2 100%   BMI 38.41 kg/m   CV: regular , S1/S2 Resp: clear to auscultation bilaterally, normal RR and effort noted GI: soft, no tenderness, with active bowel sounds.   Assessment: Encounter Diagnosis  Name Primary?   History of colonic polyps Yes     Plan: Colonoscopy   The benefits and risks of the planned procedure were described in detail with the patient or (when appropriate) their health care proxy.  Risks were outlined as including, but not limited to, bleeding, infection, perforation, adverse medication reaction leading to cardiac or pulmonary decompensation, pancreatitis (if ERCP).  The limitation of incomplete mucosal visualization was also discussed.  No guarantees or warranties were given.  The patient is appropriate for an endoscopic procedure in the ambulatory setting.   - Amada Jupiter, M

## 2023-02-04 ENCOUNTER — Ambulatory Visit: Payer: Medicare Other

## 2023-02-04 ENCOUNTER — Telehealth: Payer: Self-pay | Admitting: *Deleted

## 2023-02-04 DIAGNOSIS — Z0000 Encounter for general adult medical examination without abnormal findings: Secondary | ICD-10-CM | POA: Diagnosis not present

## 2023-02-04 NOTE — Progress Notes (Signed)
Subjective:   Darlene Wade is a 67 y.o. female who presents for Medicare Annual (Subsequent) preventive examination.  Visit Complete: Virtual I connected with  [name] on 02/04/23 by a audio enabled telemedicine application and verified that I am speaking with the correct person using two identifiers.  Patient Location: Home  Provider Location: Office/Clinic  I discussed the limitations of evaluation and management by telemedicine. The patient expressed understanding and agreed to proceed.  Vital Signs: Because this visit was a virtual/telehealth visit, some criteria may be missing or patient reported. Any vitals not documented were not able to be obtained and vitals that have been documented are patient reported.  Patient Medicare AWV questionnaire was completed by the patient on 02/03/2023; I have confirmed that all information answered by patient is correct and no changes since this date.  Cardiac Risk Factors include: advanced age (>7men, >53 women);diabetes mellitus;dyslipidemia;hypertension     Objective:    Today's Vitals   02/04/23 1111  PainSc: 5    There is no height or weight on file to calculate BMI.     02/04/2023   11:16 AM 11/16/2018    8:17 AM 08/10/2018    1:35 PM 08/03/2018    9:29 AM 02/01/2016    2:05 PM 10/01/2012   11:15 AM  Advanced Directives  Does Patient Have a Medical Advance Directive? No No No No No Patient does not have advance directive  Would patient like information on creating a medical advance directive?  No - Patient declined No - Patient declined       Current Medications (verified) Outpatient Encounter Medications as of 02/04/2023  Medication Sig   Accu-Chek Softclix Lancets lancets Test 1-2 times daily   acetaminophen (TYLENOL) 650 MG CR tablet Take 650 mg by mouth as needed for pain.   apixaban (ELIQUIS) 5 MG TABS tablet Take 1 tablet (5 mg total) by mouth 2 (two) times daily.   atorvastatin (LIPITOR) 40 MG tablet Take 1  tablet by mouth once daily   Blood Glucose Monitoring Suppl (ACCU-CHEK GUIDE) w/Device KIT Use to check with test strips   carvedilol (COREG) 12.5 MG tablet TAKE 1 TABLET BY MOUTH TWICE DAILY WITH A MEAL   Cholecalciferol (VITAMIN D) 50 MCG (2000 UT) CAPS Take 1 capsule (2,000 Units total) by mouth daily.   dapagliflozin propanediol (FARXIGA) 5 MG TABS tablet Take 1 tablet (5 mg total) by mouth daily before breakfast.   gabapentin (NEURONTIN) 300 MG capsule Take 300 mg by mouth at bedtime.   glucose blood (ACCU-CHEK GUIDE) test strip Test 1-2 times daily   Multiple Vitamin (MULTIVITAMIN) tablet Take 1 tablet by mouth daily.   sacubitril-valsartan (ENTRESTO) 49-51 MG Take 1 tablet by mouth twice daily   sitaGLIPtin-metformin (JANUMET) 50-500 MG tablet Take 1 tablet by mouth daily with breakfast.   spironolactone (ALDACTONE) 25 MG tablet Take 1 tablet by mouth once daily   No facility-administered encounter medications on file as of 02/04/2023.    Allergies (verified) Jardiance [empagliflozin] and Naproxen   History: Past Medical History:  Diagnosis Date   Asthma 07/20/2018   one puff per day   Chronic combined systolic and diastolic heart failure (HCC) 03/03/2018   Echo 07/2018: EF 40-45, diff HK worse in Inf base, normal RVSF   Diabetes mellitus without complication (HCC) 2012   Diabetic eye exam (HCC)    [facility]   Dyspnea    Elbow fracture, right 2007   Former smoker    20  pack year history, quit 2010   Hyperlipidemia    Hypertension    Insomnia    Lung nodule    Chest CT 07/2018:  RLL nodule resolved.  3 mm subpleural LUL nodule.  Repeat in 1 year if high risk.    Obesity    PONV (postoperative nausea and vomiting) 2014   1 time   Wears glasses    reading   Past Surgical History:  Procedure Laterality Date   ABDOMINAL HYSTERECTOMY  1986   BREAST CYST ASPIRATION  2015   BREAST EXCISIONAL BIOPSY Left    BREAST LUMPECTOMY WITH NEEDLE LOCALIZATION Left 10/07/2012    Procedure: BREAST LUMPECTOMY WITH NEEDLE LOCALIZATION;  Surgeon: Wilmon Arms. Corliss Skains, MD;  Location: [facility];  Service: General;  Laterality: Left;   BREAST SURGERY     COLONOSCOPY  01/2016   01/2016 with Dr. Myrtie Neither, tubular adenoma polpy; 2003 with Dr. Loreta Ave   ELBOW ARTHROPLASTY  2006   rt-fx   FOOT ARTHROTOMY  12/2010   foot fusion, right   FOOT MASS EXCISION  04/2012   left-fusion   HYSTERECTOMY ABDOMINAL WITH SALPINGECTOMY Bilateral 1985   JOINT REPLACEMENT  08/10/2018   Total knee replacement   PARTIAL HYSTERECTOMY  age 27   uterine fibroids, still has ovaries   TONSILLECTOMY     TOTAL KNEE ARTHROPLASTY Left 08/10/2018   Procedure: Left Knee Arthroplasty;  Surgeon: Gean Birchwood, MD;  Location: [facility];  Service: Orthopedics;  Laterality: Left;   Family History  Problem Relation Age of Onset   Diabetes Mother    Hypertension Mother    Heart disease Maternal Grandfather    Diabetes Maternal Aunt    Cancer Neg Hx    Breast cancer Neg Hx    Colon cancer Neg Hx    Stomach cancer Neg Hx    Esophageal cancer Neg Hx    Social History   Socioeconomic History   Marital status: Married    Spouse name: Not on file   Number of children: 1   Years of education: Not on file   Highest education level: GED or equivalent  Occupational History   Occupation: School bus driver  Tobacco Use   Smoking status: Former    Current packs/day: 0.00    Average packs/day: 1 pack/day for 20.0 years (20.0 ttl pk-yrs)    Types: Cigarettes    Start date: 04/11/1989    Quit date: 04/11/2009    Years since quitting: 13.8   Smokeless tobacco: Never  Vaping Use   Vaping status: Never Used  Substance and Sexual Activity   Alcohol use: Not Currently    Alcohol/week: 1.0 standard drink of alcohol    Comment: rare   Drug use: No   Sexual activity: Yes    Birth control/protection: Other-see comments    Comment: Hysterectomy  Other Topics Concern   Not on file  Social History Narrative    Separated from husband (2013). Living with husband.  Walking for exercise 2-3 x per week.  Retired, Herbalist in the past.  Has 2 grandchildren, teenagers.   Driving school bus.   [id number]   Social Drivers of Health   Financial Resource Strain: Low Risk  (02/04/2023)   Overall Financial Resource Strain (CARDIA)    Difficulty of Paying Living Expenses: Not hard at all  Food Insecurity: No Food Insecurity (02/04/2023)   Hunger Vital Sign    Worried About Running Out of Food in the Last Year: Never true  Ran Out of Food in the Last Year: Never true  Transportation Needs: No Transportation Needs (02/04/2023)   PRAPARE - Administrator, Civil Service (Medical): No    Lack of Transportation (Non-Medical): No  Physical Activity: Sufficiently Active (02/04/2023)   Exercise Vital Sign    Days of Exercise per Week: 5 days    Minutes of Exercise per Session: 30 min  Recent Concern: Physical Activity - Insufficiently Active (12/18/2022)   Exercise Vital Sign    Days of Exercise per Week: 5 days    Minutes of Exercise per Session: 20 min  Stress: No Stress Concern Present (02/04/2023)   [employer] of Occupational Health - Occupational Stress Questionnaire    Feeling of Stress : Not at all  Social Connections: Moderately Integrated (02/04/2023)   Social Connection and Isolation Panel [NHANES]    Frequency of Communication with Friends and Family: Twice a week    Frequency of Social Gatherings with Friends and Family: Once a week    Attends Religious Services: More than 4 times per year    Active Member of [employer] or Organizations: No    Attends Engineer, structural: Never    Marital Status: Married    Tobacco Counseling Counseling given: Not Answered   Clinical Intake:  Pre-visit preparation completed: Yes  Pain : 0-10 Pain Score: 5  Pain Type: Chronic pain Pain Location: Shoulder Pain Orientation: Right Pain Descriptors / Indicators: Aching Pain  Onset: More than a month ago Pain Frequency: Constant     Nutritional Risks: None Diabetes: Yes CBG done?: No Did pt. bring in CBG monitor from home?: No  How often do you need to have someone help you when you read instructions, pamphlets, or other written materials from your doctor or pharmacy?: 1 - Never  Interpreter Needed?: No  Information entered by :: NAllen LPN   Activities of Daily Living    02/03/2023    6:48 PM  In your present state of health, do you have any difficulty performing the following activities:  Hearing? 0  Vision? 0  Difficulty concentrating or making decisions? 0  Walking or climbing stairs? 0  Dressing or bathing? 0  Doing errands, shopping? 0  Preparing Food and eating ? N  Using the Toilet? N  In the past six months, have you accidently leaked urine? N  Do you have problems with loss of bowel control? N  Managing your Medications? N  Managing your Finances? N  Housekeeping or managing your Housekeeping? N    Patient Care Team: Tysinger, Kermit Balo, PA-C as PCP - General (Family Medicine) Nahser, Deloris Ping, MD as Consulting Physician (Cardiology) Ander Purpura, OD (Optometry) [place], Milas Kocher, [facility] (Pharmacist) [facility]  Indicate any recent Medical Services you may have received from other than [facility] providers in the past year (date may be approximate).     Assessment:   This is a routine wellness examination for Ellis.  Hearing/Vision screen Hearing Screening - Comments:: Denies hearing issues Vision Screening - Comments:: Regular eye exams, [facility]   Goals Addressed             This Visit's Progress    Patient Stated       02/04/2023, getting weight down       Depression Screen    02/04/2023   11:17 AM 05/02/2022   10:29 AM 11/13/2021   10:36 AM 04/24/2021   11:09 AM 12/19/2020    2:25 PM 06/13/2020   11:24  AM 11/17/2019    8:29 AM  PHQ 2/9 Scores  PHQ - 2 Score 0 0 0 0 0 0 0    Fall Risk    02/03/2023     6:48 PM 05/02/2022   10:29 AM 11/13/2021   10:35 AM 04/24/2021   11:09 AM 12/19/2020    2:25 PM  Fall Risk   Falls in the past year? 1 0 1 0 0  Comment tripped last week      Number falls in past yr: 0 0 0 0 0  Injury with Fall? 0 0 0 0 0  Risk for fall due to : Medication side effect No Fall Risks Impaired balance/gait No Fall Risks No Fall Risks  Follow up Falls prevention discussed;Falls evaluation completed Falls evaluation completed Falls evaluation completed Falls evaluation completed Falls evaluation completed    MEDICARE RISK AT HOME: Medicare Risk at Home Any stairs in or around the home?: (Patient-Rptd) Yes If so, are there any without handrails?: (Patient-Rptd) No Home free of loose throw rugs in walkways, pet beds, electrical cords, etc?: (Patient-Rptd) Yes Adequate lighting in your home to reduce risk of falls?: (Patient-Rptd) Yes Life alert?: (Patient-Rptd) No Use of a cane, walker or w/c?: (Patient-Rptd) No Grab bars in the bathroom?: (Patient-Rptd) No Shower chair or bench in shower?: (Patient-Rptd) No Elevated toilet seat or a handicapped toilet?: (Patient-Rptd) Yes  TIMED UP AND GO:  Was the test performed?  No    Cognitive Function:        02/04/2023   11:17 AM  6CIT Screen  What Year? 0 points  What month? 0 points  What time? 0 points  Count back from 20 0 points  Months in reverse 0 points  Repeat phrase 0 points  Total Score 0 points    Immunizations Immunization History  Administered Date(s) Administered   Fluad Quad(high Dose 65+) 11/16/2018, 11/17/2019, 11/28/2020, 11/13/2021   Influenza,inj,Quad PF,6+ Mos 09/28/2013, 09/21/2014, 12/19/2015, 12/27/2016, 11/13/2017   Influenza-Unspecified 10/27/2022   PFIZER(Purple Top)SARS-COV-2 Vaccination 03/18/2019, 04/08/2019, 11/13/2019   Pfizer Covid-19 Vaccine Bivalent Booster 24yrs & up 11/28/2020   Pfizer(Comirnaty)Fall Seasonal Vaccine 12 years and older 10/27/2022   Pneumococcal Conjugate-13  03/03/2018   Pneumococcal Polysaccharide-23 11/13/2011, 06/13/2020   Tdap 11/13/2011    TDAP status: Up to date  Flu Vaccine status: Up to date  Pneumococcal vaccine status: Up to date  Covid-19 vaccine status: Completed vaccines  Qualifies for Shingles Vaccine? Yes   Zostavax completed No   Shingrix Completed?: No.    Education has been provided regarding the importance of this vaccine. Patient has been advised to call insurance company to determine out of pocket expense if they have not yet received this vaccine. Advised may also receive vaccine at local pharmacy or Health Dept. Verbalized acceptance and understanding.  Screening Tests Health Maintenance  Topic Date Due   Zoster Vaccines- Shingrix (1 of 2) Never done   OPHTHALMOLOGY EXAM  10/19/2022   COVID-19 Vaccine (6 - 2024-25 season) 12/22/2022   FOOT EXAM  05/02/2023   HEMOGLOBIN A1C  06/17/2023   Diabetic kidney evaluation - eGFR measurement  10/24/2023   Diabetic kidney evaluation - Urine ACR  12/18/2023   Medicare Annual Wellness (AWV)  02/04/2024   MAMMOGRAM  12/15/2024   Pneumonia Vaccine 41+ Years old  Completed   INFLUENZA VACCINE  Completed   DEXA SCAN  Completed   Hepatitis C Screening  Completed   HPV VACCINES  Aged Out   Lung Cancer Screening  Discontinued   DTaP/Tdap/Td  Discontinued   Colonoscopy  Discontinued   Fecal DNA (Cologuard)  Discontinued    Health Maintenance  Health Maintenance Due  Topic Date Due   Zoster Vaccines- Shingrix (1 of 2) Never done   OPHTHALMOLOGY EXAM  10/19/2022   COVID-19 Vaccine (6 - 2024-25 season) 12/22/2022    Colorectal cancer screening: Type of screening: Colonoscopy. Completed 02/03/2023. Repeat every 10 years  Mammogram status: Completed 12/16/2022. Repeat every year  Bone Density status: Completed 06/12/2021.   Lung Cancer Screening: (Low Dose CT Chest recommended if Age 75-80 years, 20 pack-year currently smoking OR have quit w/in 15years.) does not qualify.    Lung Cancer Screening Referral: no  Additional Screening:  Hepatitis C Screening: does qualify; Completed 12/18/2022  Vision Screening: Recommended annual ophthalmology exams for early detection of glaucoma and other disorders of the eye. Is the patient up to date with their annual eye exam?  No  Who is the provider or what is the name of the office in which the patient attends annual eye exams? [facility] If pt is not established with a provider, would they like to be referred to a provider to establish care? No .   Dental Screening: Recommended annual dental exams for proper oral hygiene  Diabetic Foot Exam: Diabetic Foot Exam: Completed 05/02/2022  Community Resource Referral / Chronic Care Management: CRR required this visit?  No   CCM required this visit?  No     Plan:     I have personally reviewed and noted the following in the patient's chart:   Medical and social history Use of alcohol, tobacco or illicit drugs  Current medications and supplements including opioid prescriptions. Patient is not currently taking opioid prescriptions. Functional ability and status Nutritional status Physical activity Advanced directives List of other physicians Hospitalizations, surgeries, and ER visits in previous 12 months Vitals Screenings to include cognitive, depression, and falls Referrals and appointments  In addition, I have reviewed and discussed with patient certain preventive protocols, quality metrics, and best practice recommendations. A written personalized care plan for preventive services as well as general preventive health recommendations were provided to patient.     Barb Merino, LPN   16/11/9602   After Visit Summary: (MyChart) Due to this being a telephonic visit, the after visit summary with patients personalized plan was offered to patient via MyChart   Nurse Notes: none

## 2023-02-04 NOTE — Patient Instructions (Signed)
Ms. Schneiter , Thank you for taking time to come for your Medicare Wellness Visit. I appreciate your ongoing commitment to your health goals. Please review the following plan we discussed and let me know if I can assist you in the future.   Referrals/Orders/Follow-Ups/Clinician Recommendations: none  This is a list of the screening recommended for you and due dates:  Health Maintenance  Topic Date Due   Zoster (Shingles) Vaccine (1 of 2) Never done   Eye exam for diabetics  10/19/2022   COVID-19 Vaccine (6 - 2024-25 season) 12/22/2022   Complete foot exam   05/02/2023   Hemoglobin A1C  06/17/2023   Yearly kidney function blood test for diabetes  10/24/2023   Yearly kidney health urinalysis for diabetes  12/18/2023   Medicare Annual Wellness Visit  02/04/2024   Mammogram  12/15/2024   Pneumonia Vaccine  Completed   Flu Shot  Completed   DEXA scan (bone density measurement)  Completed   Hepatitis C Screening  Completed   HPV Vaccine  Aged Out   Screening for Lung Cancer  Discontinued   DTaP/Tdap/Td vaccine  Discontinued   Colon Cancer Screening  Discontinued   Cologuard (Stool DNA test)  Discontinued    Advanced directives: (ACP Link)Information on Advanced Care Planning can be found at [facility] Advance Health Care Directives Advance Health Care Directives ([email or web address]   Next Medicare Annual Wellness Visit scheduled for next year: Yes  Insert Preventive Care attachment Insert FALL PREVENTION attachment if needed

## 2023-02-04 NOTE — Telephone Encounter (Signed)
Left message on f/u call 

## 2023-02-07 LAB — HM DIABETES EYE EXAM

## 2023-02-13 ENCOUNTER — Ambulatory Visit: Payer: Medicare Other | Admitting: Nurse Practitioner

## 2023-02-13 ENCOUNTER — Ambulatory Visit (INDEPENDENT_AMBULATORY_CARE_PROVIDER_SITE_OTHER): Payer: Medicare Other | Admitting: Medical

## 2023-02-13 VITALS — BP 120/80 | HR 64 | Wt 204.8 lb

## 2023-02-13 DIAGNOSIS — R040 Epistaxis: Secondary | ICD-10-CM

## 2023-02-13 DIAGNOSIS — Z7901 Long term (current) use of anticoagulants: Secondary | ICD-10-CM | POA: Diagnosis not present

## 2023-02-13 NOTE — Progress Notes (Signed)
 Subjective:  Darlene Wade is a 68 y.o. female who presents for Chief Complaint  Patient presents with   Epistaxis    Nose bleeds x 7 days between 2-4am. Taking about 15 minutes to stop.      Here for nosebleeds.  She has had several nosebleeds in the past week however she has not had a nosebleed yesterday or today.  It is only on the right side and typically happens early in the morning about 2 AM.  She is on Eliquis  anticoagulation.  No other bleeding or bruising noted.  No other symptoms.  She uses heat pump/electric heat at home.  No other aggravating or relieving factors.    No other c/o.  The following portions of the patient's history were reviewed and updated as appropriate: allergies, current medications, past family history, past medical history, past social history, past surgical history and problem list.  ROS Otherwise as in subjective above  Objective: BP 120/80   Pulse 64   Wt 204 lb 12.8 oz (92.9 kg)   BMI 37.46 kg/m   General appearance: alert, no distress, well developed, well nourished HEENT: On the right superior nostril there is a scabbed area where there has been some bleeding but no other current bleeding or scab.  No other significant erythema.  Normocephalic, sclerae anicteric, conjunctiva pink and moist, TMs pearly, nares patent, no discharge or erythema, pharynx normal Oral cavity: MMM, no lesions   Assessment: Encounter Diagnoses  Name Primary?   Epistaxis Yes   Anticoagulated      Plan: Advised to use humidifier in the room daily particular at night in her bedroom, and for the next several days use some triple antibiotic ointment such as Neosporin to coat the inside the right nostril.  If she continues to get nosebleeds over the next week despite these recommendations recheck for possible cautery.  Darlene Wade was seen today for epistaxis.  Diagnoses and all orders for this visit:  Epistaxis  Anticoagulated    Follow up: prn

## 2023-02-18 ENCOUNTER — Other Ambulatory Visit: Payer: Self-pay | Admitting: Medical

## 2023-02-18 ENCOUNTER — Telehealth: Payer: Self-pay | Admitting: Medical

## 2023-02-18 DIAGNOSIS — R040 Epistaxis: Secondary | ICD-10-CM

## 2023-02-18 NOTE — Telephone Encounter (Signed)
 Pt called and she is still having nose bleeds and wants ENT referral

## 2023-02-19 ENCOUNTER — Encounter (INDEPENDENT_AMBULATORY_CARE_PROVIDER_SITE_OTHER): Payer: Self-pay | Admitting: Otolaryngology

## 2023-03-20 ENCOUNTER — Encounter: Payer: Self-pay | Admitting: Cardiovascular Disease

## 2023-03-20 NOTE — Progress Notes (Signed)
 Cardiology Office Note:    Date:  03/21/2023   ID:  Darlene Wade, DOB [birth year], MRN [id number]  PCP:  Bulah Alm RAMAN, PA-C  Cardiologist:  New to [facility]  Electrophysiologist:  None   Referring MD: Bulah Alm RAMAN, PA-C   Problem List 1. Diabetes 2. HTN 3. Hyperlipidemia  4.  Osteoarthritis  5.  Atrial fib  Chief Complaint  Patient presents with   Congestive Heart Failure          Dec. 2, 2019:    Darlene Wade is a 68 y.o. female with a hx of hypertension, hyperlipidemia, Diabetes  Her primary ordered on echo  She was found to have a very mildly reduced left ventricular systolic function ejection fraction of 40 to 45%.  She has grade 1 diastolic dysfunction.  Was exercising regularly in the gym but her knees started causing her lots of pain  Now she is walking 30 minutes a day  Drives a school bus   She does not eat  Lots of salt Has lost 40 lbs over the past several months - has been paying attention to her diet  Is also on Invokana    Nov. 23, 2021:  Darlene Wade is seen today for follow-up visit.  She has chronic combined systolic and diastolic congestive heart failure. Last echocardiogram was June, 2020.  She has a left ventricular systolic function with an EF of 40 to 45%.  At that time she had normal diastolic parameters. Has not gained any weight .   Trying to watch her diet .  Wt today is 217 lbs ( down 3 lbs since Dec. 2019. She has had knee surgery since then.   Thinking about getting the other knee done   Lipids are managed by her primary md, labs were reviewed ,  Look great  December 26, 2020: Darlene Wade seen today for follow-up of her chronic combined systolic and diastolic congestive heart failure.  Last echocardiogram was performed in June, 2020.  Her ejection fraction is 40 to 45%. Had n Right TKA in June, 2022  Is back exercising ,  no CP or dyspnea Is still rehabbing her knee.   She is in the donut hole currently.  We would like to start her on  Entresto  but this may be cost prohibitive for now.  The plan is for her to start Entresto  49-51 p.o. twice daily starting on January 1.  She will stop the losartan  at that time.  We will check a basic metabolic profile approximately 2 to 3 weeks after she starts the Entresto . Anticipate that she will be able to take the Entresto  until about October or November of next year at which time she will change back to losartan  100 mg a day.  I will see her in 6 months.  Anticipate getting an echocardiogram if she has been able to tolerate the Entresto .  Hopefully we will see some further improvement of her left ventricular function.  Aug. 4, 2023 Darlene Wade is seen for follow up of her CHF Is on entresto  49-51 bid Watches her salt Walks ( 11,000 steps a day ) Is on Farxiga  Jardiance  caused yeast infection  Will add spironolactone     Feb. 7 , 2025 Darlene Wade is seen for follow up of her CHF  Was found to have atrial fib by Jackee Alberts, NP since I last saw her  She is in NSR to day   Past Medical History:  Diagnosis Date   Asthma 07/20/2018   one  puff per day   Chronic combined systolic and diastolic heart failure (HCC) 03/03/2018   Echo 07/2018: EF 40-45, diff HK worse in Inf base, normal RVSF   Diabetes mellitus without complication (HCC) 2012   Diabetic eye exam (HCC)    [employer]   Dyspnea    Elbow fracture, right 2007   Former smoker    20 pack year history, quit 2010   Hyperlipidemia    Hypertension    Insomnia    Lung nodule    Chest CT 07/2018:  RLL nodule resolved.  3 mm subpleural LUL nodule.  Repeat in 1 year if high risk.    Obesity    PONV (postoperative nausea and vomiting) 2014   1 time   Wears glasses    reading    Past Surgical History:  Procedure Laterality Date   ABDOMINAL HYSTERECTOMY  1986   BREAST CYST ASPIRATION  2015   BREAST EXCISIONAL BIOPSY Left    BREAST LUMPECTOMY WITH NEEDLE LOCALIZATION Left 10/07/2012   Procedure: BREAST LUMPECTOMY WITH NEEDLE  LOCALIZATION;  Surgeon: Donnice POUR. Belinda, MD;  Location: [facility];  Service: General;  Laterality: Left;   BREAST SURGERY     COLONOSCOPY  01/2016   01/2016 with Dr. Legrand, tubular adenoma polpy; 2003 with Dr. Kristie   ELBOW ARTHROPLASTY  2006   rt-fx   FOOT ARTHROTOMY  12/2010   foot fusion, right   FOOT MASS EXCISION  04/2012   left-fusion   HYSTERECTOMY ABDOMINAL WITH SALPINGECTOMY Bilateral 1985   JOINT REPLACEMENT  08/10/2018   Total knee replacement   PARTIAL HYSTERECTOMY  age 83   uterine fibroids, still has ovaries   TONSILLECTOMY     TOTAL KNEE ARTHROPLASTY Left 08/10/2018   Procedure: Left Knee Arthroplasty;  Surgeon: Liam Lerner, MD;  Location: [facility];  Service: Orthopedics;  Laterality: Left;    Current Medications: Current Meds  Medication Sig   Accu-Chek Softclix Lancets lancets Test 1-2 times daily   acetaminophen  (TYLENOL ) 650 MG CR tablet Take 650 mg by mouth as needed for pain.   apixaban  (ELIQUIS ) 5 MG TABS tablet Take 1 tablet (5 mg total) by mouth 2 (two) times daily.   atorvastatin  (LIPITOR ) 40 MG tablet Take 1 tablet by mouth once daily   Blood Glucose Monitoring Suppl (ACCU-CHEK GUIDE) w/Device KIT Use to check with test strips   carvedilol  (COREG ) 12.5 MG tablet TAKE 1 TABLET BY MOUTH TWICE DAILY WITH A MEAL   Cholecalciferol (VITAMIN D ) 50 MCG (2000 UT) CAPS Take 1 capsule (2,000 Units total) by mouth daily.   dapagliflozin  propanediol (FARXIGA ) 5 MG TABS tablet Take 1 tablet (5 mg total) by mouth daily before breakfast.   gabapentin  (NEURONTIN ) 300 MG capsule Take 300 mg by mouth at bedtime.   glucose blood (ACCU-CHEK GUIDE) test strip Test 1-2 times daily   Multiple Vitamin (MULTIVITAMIN) tablet Take 1 tablet by mouth daily.   sacubitril -valsartan  (ENTRESTO ) 49-51 MG Take 1 tablet by mouth twice daily   sitaGLIPtin -metformin  (JANUMET ) 50-500 MG tablet Take 1 tablet by mouth daily with breakfast.   spironolactone  (ALDACTONE ) 25 MG tablet  Take 1 tablet by mouth once daily     Allergies:   Jardiance  [empagliflozin ] and Naproxen   Social History   Socioeconomic History   Marital status: Married    Spouse name: Not on file   Number of children: 1   Years of education: Not on file   Highest education level: GED or equivalent  Occupational History   Occupation: School bus driver  Tobacco Use   Smoking status: Former    Current packs/day: 0.00    Average packs/day: 1 pack/day for 20.0 years (20.0 ttl pk-yrs)    Types: Cigarettes    Start date: 04/11/1989    Quit date: 04/11/2009    Years since quitting: 13.9   Smokeless tobacco: Never  Vaping Use   Vaping status: Never Used  Substance and Sexual Activity   Alcohol use: Not Currently    Alcohol/week: 1.0 standard drink of alcohol    Comment: rare   Drug use: No   Sexual activity: Yes    Birth control/protection: Other-see comments    Comment: Hysterectomy  Other Topics Concern   Not on file  Social History Narrative   Separated from husband (2013). Living with husband.  Walking for exercise 2-3 x per week.  Retired, herbalist in the past.  Has 2 grandchildren, teenagers.   Driving school bus.   [id number]   Social Drivers of Health   Financial Resource Strain: Low Risk  (02/04/2023)   Overall Financial Resource Strain (CARDIA)    Difficulty of Paying Living Expenses: Not hard at all  Food Insecurity: No Food Insecurity (02/04/2023)   Hunger Vital Sign    Worried About Running Out of Food in the Last Year: Never true    Ran Out of Food in the Last Year: Never true  Transportation Needs: No Transportation Needs (02/04/2023)   PRAPARE - Administrator, Civil Service (Medical): No    Lack of Transportation (Non-Medical): No  Physical Activity: Sufficiently Active (02/04/2023)   Exercise Vital Sign    Days of Exercise per Week: 5 days    Minutes of Exercise per Session: 30 min  Recent Concern: Physical Activity - Insufficiently Active (12/18/2022)    Exercise Vital Sign    Days of Exercise per Week: 5 days    Minutes of Exercise per Session: 20 min  Stress: No Stress Concern Present (02/04/2023)   [employer] of Occupational Health - Occupational Stress Questionnaire    Feeling of Stress : Not at all  Social Connections: Moderately Integrated (02/04/2023)   Social Connection and Isolation Panel [NHANES]    Frequency of Communication with Friends and Family: Twice a week    Frequency of Social Gatherings with Friends and Family: Once a week    Attends Religious Services: More than 4 times per year    Active Member of [employer] or Organizations: No    Attends Engineer, Structural: Never    Marital Status: Married     Family History: The patient's family history includes Diabetes in her maternal aunt and mother; Heart disease in her maternal grandfather; Hypertension in her mother. There is no history of Cancer, Breast cancer, Colon cancer, Stomach cancer, or Esophageal cancer.  ROS:   Please see the history of present illness.     All other systems reviewed and are negative.  EKGs/Labs/Other Studies Reviewed:    The following studies were reviewed today:    Recent Labs: 09/24/2022: Magnesium 1.6; TSH 1.480 10/24/2022: BUN 21; Creatinine, Ser 1.04; Hemoglobin 11.7; Platelets 246; Potassium 4.5; Sodium 139 12/18/2022: ALT 7  Recent Lipid Panel    Component Value Date/Time   CHOL 164 12/18/2022 1020   TRIG 90 12/18/2022 1020   HDL 52 12/18/2022 1020   CHOLHDL 3.2 12/18/2022 1020   CHOLHDL 2.0 05/22/2016 1014   VLDL 10 05/22/2016 1014   LDLCALC 95 12/18/2022  1020    Physical Exam:      Physical Exam: Blood pressure 122/68, pulse 87, height 5' 2 (1.575 m), weight 201 lb (91.2 kg), SpO2 95%.      GEN:  Well nourished, well developed in no acute distress HEENT: Normal NECK: No JVD; No carotid bruits LYMPHATICS: No lymphadenopathy CARDIAC: RRR , no murmurs, rubs, gallops RESPIRATORY:  Clear to  auscultation without rales, wheezing or rhonchi  ABDOMEN: Soft, non-tender, non-distended MUSCULOSKELETAL:  No edema; No deformity  SKIN: Warm and dry NEUROLOGIC:  Alert and oriented x 3   EKG:           ASSESSMENT:    1. Chronic combined systolic and diastolic heart failure (HCC)   2. Paroxysmal atrial fibrillation (HCC)       PLAN:       1.  Mild chronic systolic  congestive heart failure: She is doing well.  Continue current CHF medications   2.  Hyperlipidemia: has minimal , non obstructive CAD . LDL is 95  Continue atorvastatin   40 mg a day.  She is committed to working on weight loss and better exercise   3.  Obesity:   Encouraged better exercise and weight loss.  4.  Paroxysmal atrial fibrillation: She was found to have paroxysmal atrial fibrillation.  She is maintaining sinus rhythm today.  Continue Eliquis .    Medication Adjustments/Labs and Tests Ordered: Current medicines are reviewed at length with the patient today.  Concerns regarding medicines are outlined above.  No orders of the defined types were placed in this encounter.   No orders of the defined types were placed in this encounter.    Patient Instructions  Follow-Up: At [facility], you and your health needs are our priority.  As part of our continuing mission to provide you with exceptional heart care, we have created designated Provider Care Teams.  These Care Teams include your primary Cardiologist (physician) and Advanced Practice Providers (APPs -  Physician Assistants and Nurse Practitioners) who all work together to provide you with the care you need, when you need it.  We recommend signing up for the patient portal called MyChart.  Sign up information is provided on this After Visit Summary.  MyChart is used to connect with patients for Virtual Visits (Telemedicine).  Patients are able to view lab/test results, encounter notes, upcoming appointments, etc.  Non-urgent messages can  be sent to your provider as well.   To learn more about what you can do with MyChart, go to [email or web address].    Your next appointment:   1 year  Provider:   Provider  Other Instructions   1st Floor: - Lobby - Registration  - Pharmacy  - Lab - Cafe  2nd Floor: - PV Lab - Diagnostic Testing (echo, CT, nuclear med)  3rd Floor: - Vacant  4th Floor: - TCTS (cardiothoracic surgery) - AFib Clinic - Structural Heart Clinic - Vascular Surgery  - Vascular Ultrasound  5th Floor: - HeartCare Cardiology (general and EP) - Clinical Pharmacy for coumadin, hypertension, lipid, weight-loss medications, and med management appointments    Valet parking services will be available as well.          Signed, Aleene Passe, MD  03/21/2023 2:58 PM    [facility]

## 2023-03-21 ENCOUNTER — Encounter: Payer: Self-pay | Admitting: Cardiovascular Disease

## 2023-03-21 ENCOUNTER — Telehealth: Payer: Self-pay | Admitting: Medical

## 2023-03-21 ENCOUNTER — Ambulatory Visit: Payer: Medicare Other | Attending: Cardiovascular Disease | Admitting: Cardiovascular Disease

## 2023-03-21 VITALS — BP 122/68 | HR 87 | Ht 62.0 in | Wt 201.0 lb

## 2023-03-21 DIAGNOSIS — I5042 Chronic combined systolic (congestive) and diastolic (congestive) heart failure: Secondary | ICD-10-CM

## 2023-03-21 DIAGNOSIS — I480 Paroxysmal atrial fibrillation: Secondary | ICD-10-CM

## 2023-03-21 NOTE — Telephone Encounter (Signed)
 Left message have samples of Farxiga  up front

## 2023-03-21 NOTE — Patient Instructions (Signed)
 Follow-Up: At [facility], you and your health needs are our priority.  As part of our continuing mission to provide you with exceptional heart care, we have created designated Provider Care Teams.  These Care Teams include your primary Cardiologist (physician) and Advanced Practice Providers (APPs -  Physician Assistants and Nurse Practitioners) who all work together to provide you with the care you need, when you need it.  We recommend signing up for the patient portal called MyChart.  Sign up information is provided on this After Visit Summary.  MyChart is used to connect with patients for Virtual Visits (Telemedicine).  Patients are able to view lab/test results, encounter notes, upcoming appointments, etc.  Non-urgent messages can be sent to your provider as well.   To learn more about what you can do with MyChart, go to [email or web address].    Your next appointment:   1 year  Provider:   Provider  Other Instructions   1st Floor: - Lobby - Registration  - Pharmacy  - Lab - Cafe  2nd Floor: - PV Lab - Diagnostic Testing (echo, CT, nuclear med)  3rd Floor: - Vacant  4th Floor: - TCTS (cardiothoracic surgery) - AFib Clinic - Structural Heart Clinic - Vascular Surgery  - Vascular Ultrasound  5th Floor: - HeartCare Cardiology (general and EP) - Clinical Pharmacy for coumadin, hypertension, lipid, weight-loss medications, and med management appointments    Valet parking services will be available as well.

## 2023-03-24 ENCOUNTER — Other Ambulatory Visit: Payer: Self-pay | Admitting: Medical

## 2023-03-26 DIAGNOSIS — M25561 Pain in right knee: Secondary | ICD-10-CM | POA: Diagnosis not present

## 2023-03-26 DIAGNOSIS — M19011 Primary osteoarthritis, right shoulder: Secondary | ICD-10-CM | POA: Diagnosis not present

## 2023-03-26 DIAGNOSIS — M25562 Pain in left knee: Secondary | ICD-10-CM | POA: Diagnosis not present

## 2023-04-02 ENCOUNTER — Telehealth: Payer: Self-pay | Admitting: Cardiovascular Disease

## 2023-04-02 NOTE — Telephone Encounter (Signed)
Patient calling the office for samples of medication:   1.  What medication and dosage are you requesting samples for? Eliquis   2.  Are you currently out of this medication?   No, patient has about 3 tablets remaining, but Eliquis will cost her $300

## 2023-04-02 NOTE — Telephone Encounter (Signed)
Spoke with patient and she is aware after her deductible Eliquis will cost $47. She will buy Eliquis today.

## 2023-04-02 NOTE — Telephone Encounter (Signed)
She has a $255 deductible. Once that is paid it will be $47/month. She can set up a payment plan if she cannot afford the ~300 all at once. She would get the meds at the pahrmacy for free and then get a bill monthly from [facility]. She would need to call [employer] to do this.  If she cannot afford this, then a alternative plan needs to be put in place because samples are not a long term solution

## 2023-04-14 DIAGNOSIS — G4733 Obstructive sleep apnea (adult) (pediatric): Secondary | ICD-10-CM | POA: Diagnosis not present

## 2023-04-15 ENCOUNTER — Telehealth (INDEPENDENT_AMBULATORY_CARE_PROVIDER_SITE_OTHER): Payer: Self-pay | Admitting: Otolaryngology

## 2023-04-15 NOTE — Telephone Encounter (Signed)
 Confirmed appt and location with patient for 04/16/2023.

## 2023-04-16 ENCOUNTER — Encounter (INDEPENDENT_AMBULATORY_CARE_PROVIDER_SITE_OTHER): Payer: Self-pay | Admitting: Otolaryngology

## 2023-04-16 ENCOUNTER — Ambulatory Visit (INDEPENDENT_AMBULATORY_CARE_PROVIDER_SITE_OTHER): Payer: Medicare Other | Admitting: Otolaryngology

## 2023-04-16 ENCOUNTER — Encounter (INDEPENDENT_AMBULATORY_CARE_PROVIDER_SITE_OTHER): Payer: Self-pay

## 2023-04-16 VITALS — BP 135/80 | HR 90 | Ht 62.0 in | Wt 200.0 lb

## 2023-04-16 DIAGNOSIS — R040 Epistaxis: Secondary | ICD-10-CM | POA: Diagnosis not present

## 2023-04-19 DIAGNOSIS — R040 Epistaxis: Secondary | ICD-10-CM | POA: Insufficient documentation

## 2023-04-19 NOTE — Progress Notes (Signed)
 Patient ID: Darlene Wade, female   DOB: May 06, 1955, 68 y.o.   MRN: [id number]  CC: Recurrent right epistaxis  HPI:  Darlene Wade is a 68 y.o. female who presents today complaining of recurrent right epistaxis for the past 2 months.  The frequency and severity of her bleeding has increased lately.  She has been having daily bleeding for the past week.  She denies any recent nasal trauma.  She is on Eliquis.  She has a history of environmental allergies.  She uses over-the-counter allergy medications as needed.  She has no previous nasal surgery.  Past Medical History:  Diagnosis Date   Asthma 07/20/2018   one puff per day   Chronic combined systolic and diastolic heart failure (HCC) 03/03/2018   Echo 07/2018: EF 40-45, diff HK worse in Inf base, normal RVSF   Diabetes mellitus without complication (HCC) 2012   Diabetic eye exam (HCC)    [employer]   Dyspnea    Elbow fracture, right 2007   Former smoker    20 pack year history, quit 2010   Hyperlipidemia    Hypertension    Insomnia    Lung nodule    Chest CT 07/2018:  RLL nodule resolved.  3 mm subpleural LUL nodule.  Repeat in 1 year if high risk.    Obesity    PONV (postoperative nausea and vomiting) 2014   1 time   Wears glasses    reading    Past Surgical History:  Procedure Laterality Date   ABDOMINAL HYSTERECTOMY  1986   BREAST CYST ASPIRATION  2015   BREAST EXCISIONAL BIOPSY Left    BREAST LUMPECTOMY WITH NEEDLE LOCALIZATION Left 10/07/2012   Procedure: BREAST LUMPECTOMY WITH NEEDLE LOCALIZATION;  Surgeon: Wilmon Arms. Corliss Skains, MD;  Location: [facility];  Service: General;  Laterality: Left;   BREAST SURGERY     COLONOSCOPY  01/2016   01/2016 with Dr. Myrtie Neither, tubular adenoma polpy; 2003 with Dr. Loreta Ave   ELBOW ARTHROPLASTY  2006   rt-fx   FOOT ARTHROTOMY  12/2010   foot fusion, right   FOOT MASS EXCISION  04/2012   left-fusion   HYSTERECTOMY ABDOMINAL WITH SALPINGECTOMY Bilateral 1985   JOINT REPLACEMENT   08/10/2018   Total knee replacement   PARTIAL HYSTERECTOMY  age 15   uterine fibroids, still has ovaries   TONSILLECTOMY     TOTAL KNEE ARTHROPLASTY Left 08/10/2018   Procedure: Left Knee Arthroplasty;  Surgeon: Gean Birchwood, MD;  Location: [facility];  Service: Orthopedics;  Laterality: Left;    Family History  Problem Relation Age of Onset   Diabetes Mother    Hypertension Mother    Heart disease Maternal Grandfather    Diabetes Maternal Aunt    Cancer Neg Hx    Breast cancer Neg Hx    Colon cancer Neg Hx    Stomach cancer Neg Hx    Esophageal cancer Neg Hx     Social History:  reports that she quit smoking about 14 years ago. Her smoking use included cigarettes. She started smoking about 34 years ago. She has a 20 pack-year smoking history. She has never used smokeless tobacco. She reports that she does not currently use alcohol after a past usage of about 1.0 standard drink of alcohol per week. She reports that she does not use drugs.  Allergies:  Allergies  Allergen Reactions   Jardiance [Empagliflozin]     Yeast infections, tolerates farxiga though   Naproxen Nausea Only  Prior to Admission medications   Medication Sig Start Date End Date Taking? Authorizing Provider  Accu-Chek Softclix Lancets lancets Test 1-2 times daily 06/20/22  Yes Tysinger, Kermit Balo, PA-C  acetaminophen (TYLENOL) 650 MG CR tablet Take 650 mg by mouth as needed for pain.   Yes [provider]  apixaban (ELIQUIS) 5 MG TABS tablet Take 1 tablet (5 mg total) by mouth 2 (two) times daily. 11/18/22  Yes Nahser, Deloris Ping, MD  atorvastatin (LIPITOR) 40 MG tablet Take 1 tablet by mouth once daily 03/24/23  Yes Tysinger, Kermit Balo, PA-C  Blood Glucose Monitoring Suppl (ACCU-CHEK GUIDE) w/Device KIT Use to check with test strips 06/20/22  Yes Tysinger, Kermit Balo, PA-C  carvedilol (COREG) 12.5 MG tablet TAKE 1 TABLET BY MOUTH TWICE DAILY WITH A MEAL 01/20/23  Yes Gaston Islam., NP  Cholecalciferol (VITAMIN D)  50 MCG (2000 UT) CAPS Take 1 capsule (2,000 Units total) by mouth daily. 12/19/22  Yes Tysinger, Kermit Balo, PA-C  dapagliflozin propanediol (FARXIGA) 5 MG TABS tablet Take 1 tablet (5 mg total) by mouth daily before breakfast. 12/19/22  Yes Tysinger, Kermit Balo, PA-C  gabapentin (NEURONTIN) 300 MG capsule Take 300 mg by mouth at bedtime. 09/13/22  Yes [provider]  glucose blood (ACCU-CHEK GUIDE) test strip Test 1-2 times daily 06/20/22  Yes Tysinger, Kermit Balo, PA-C  Multiple Vitamin (MULTIVITAMIN) tablet Take 1 tablet by mouth daily.   Yes [provider]  sacubitril-valsartan (ENTRESTO) 49-51 MG Take 1 tablet by mouth twice daily 01/20/23  Yes Nahser, Deloris Ping, MD  sitaGLIPtin-metformin (JANUMET) 50-500 MG tablet Take 1 tablet by mouth daily with breakfast. 12/19/22  Yes Tysinger, Kermit Balo, PA-C  spironolactone (ALDACTONE) 25 MG tablet Take 1 tablet by mouth once daily 10/15/22  Yes Dick, Devoria Albe., NP    Blood pressure 135/80, pulse 90, height 5\' 2"  (1.575 m), weight 200 lb (90.7 kg), SpO2 92%. Exam: General: Communicates without difficulty, well nourished, no acute distress. Head: Normocephalic, no evidence injury, no tenderness, facial buttresses intact without stepoff. Face/sinus: No tenderness to palpation and percussion. Facial movement is normal and symmetric. Eyes: PERRL, EOMI. No scleral icterus, conjunctivae clear. Neuro: CN II exam reveals vision grossly intact.  No nystagmus at any point of gaze. Ears: Auricles well formed without lesions.  Ear canals are intact without mass or lesion.  No erythema or edema is appreciated.  The TMs are intact without fluid. Nose: External evaluation reveals normal support and skin without lesions.  Dorsum is intact.  Anterior rhinoscopy reveals hypervascular areas on the right nasal septum.  Oral:  Oral cavity and oropharynx are intact, symmetric, without erythema or edema.  Mucosa is moist without lesions. Neck: Full range of motion without pain.   There is no significant lymphadenopathy.  No masses palpable.  Thyroid bed within normal limits to palpation.  Parotid glands and submandibular glands equal bilaterally without mass.  Trachea is midline. Neuro:  CN 2-12 grossly intact.   Procedure:  Endoscopic control of recurrent right epistaxis. Indication:  Recurrent epistaxis  Description:  The right nasal cavity is sprayed with topical xylocaine and neo-synephrine.  After adequate anesthesia is achieved, the nasal cavity is examined with a 0 rigid endoscope.  A suction catheter is inserted into parallel with the 0 endoscope, and it is used to suction blood clots from the nasal cavity.  Several hypervascular areas are noted on the anterior and superior portion of the septum. Active bleeding is noted. A silver  nitrate stick is inserted in parallel with the 0 endoscope.  It is used to repeatedly cauterized the hypervascular areas.  Good hemostasis is achieved.  The patient tolerated the procedure well.    Assessment: 1.  Recurrent right epistaxis.  Hypervascular areas are noted on the right anterior and superior nasal septum. 2.  No suspicious mass or lesion is noted today.  Plan: 1.  The physical exam and nasal endoscopy findings are reviewed with the patient. 2.  Endoscopic cauterization of the right nasal septum. 3.  Humidifier/nasal ointment during the winter months. 4.  The patient will return for reevaluation in 4 weeks.  Stephnie Parlier W Amorina Doerr 04/19/2023, 3:56 PM

## 2023-04-21 ENCOUNTER — Other Ambulatory Visit: Payer: Self-pay

## 2023-04-21 MED ORDER — CARVEDILOL 12.5 MG PO TABS: 12.5000 mg | ORAL_TABLET | Freq: Two times a day (BID) | ORAL | 3 refills | Status: DC

## 2023-04-22 DIAGNOSIS — M19011 Primary osteoarthritis, right shoulder: Secondary | ICD-10-CM | POA: Diagnosis not present

## 2023-05-07 DIAGNOSIS — G4733 Obstructive sleep apnea (adult) (pediatric): Secondary | ICD-10-CM | POA: Diagnosis not present

## 2023-05-12 ENCOUNTER — Ambulatory Visit: Payer: Self-pay

## 2023-05-12 NOTE — Telephone Encounter (Signed)
  Chief Complaint: nose bleeds Symptoms: several times per day. Has at night Frequency: daily has seen ENT in past but upcoming appt is too far out Pertinent Negatives: Patient denies dizziness Disposition: [] ED /[] Urgent Care (no appt availability in office) / [x] Appointment(In office/virtual)/ []  [facility]/ [] Home Care/ [] Refused Recommended Disposition /[] [facility]/ []  Follow-up with PCP Additional Notes:pt wanted to be with PCP- next scheduled appt tomorrow am.  Copied from CRM [id number]. Topic: Clinical - Red Word Triage >> May 12, 2023  8:42 AM Alessandra Bevels wrote: Red Word that prompted transfer to Nurse Triage: Patient is calling report nose bleeds sometimes multiple times a day. Please advise Reason for Disposition  Taking Coumadin (warfarin) or other strong blood thinner, or known bleeding disorder (e.g., thrombocytopenia)  Answer Assessment - Initial Assessment Questions 1. AMOUNT OF BLEEDING: "How bad is the bleeding?" "How much blood was lost?" "Has the bleeding stopped?"   - MILD: needed a couple tissues   - MODERATE: needed many tissues   - SEVERE: large blood clots, soaked many tissues, lasted more than 30 minutes      Mod to severe 2. ONSET: "When did the nosebleed start?"      On going sees ENT but despite tx has several times per day  3. FREQUENCY: "How many nosebleeds have you had in the last 24 hours?"      daily 4. RECURRENT SYMPTOMS: "Have there been other recent nosebleeds?" If Yes, ask: "How long did it take you to stop the bleeding?" "What worked best?"      Takes Epiquis 5. CAUSE: "What do you think caused this nosebleed?"     Elliquis 7. SYSTEMIC FACTORS: "Do you have high blood pressure or any bleeding problems?"     On coag. tx 8. BLOOD THINNERS: "Do you take any blood thinners?" (e.g., aspirin, clopidogrel / Plavix, coumadin, heparin). Notes: Other strong blood thinners include: Arixtra (fondaparinux), Eliquis (apixaban), Pradaxa  (dabigatran), and Xarelto (rivaroxaban).     yes 9. OTHER SYMPTOMS: "Do you have any other symptoms?" (e.g., lightheadedness)     no  Protocols used: Nosebleed-A-AH

## 2023-05-12 NOTE — Telephone Encounter (Signed)
 There is some openings on Dr. Lynelle Doctor schedule for today, if she doesn't want to wait until tomorrow

## 2023-05-13 ENCOUNTER — Encounter: Payer: Self-pay | Admitting: Medical

## 2023-05-13 ENCOUNTER — Ambulatory Visit (INDEPENDENT_AMBULATORY_CARE_PROVIDER_SITE_OTHER): Admitting: Medical

## 2023-05-13 VITALS — BP 110/70 | HR 80 | Wt 209.2 lb

## 2023-05-13 DIAGNOSIS — Z79899 Other long term (current) drug therapy: Secondary | ICD-10-CM | POA: Diagnosis not present

## 2023-05-13 DIAGNOSIS — I480 Paroxysmal atrial fibrillation: Secondary | ICD-10-CM | POA: Diagnosis not present

## 2023-05-13 DIAGNOSIS — Z7901 Long term (current) use of anticoagulants: Secondary | ICD-10-CM

## 2023-05-13 DIAGNOSIS — I5022 Chronic systolic (congestive) heart failure: Secondary | ICD-10-CM | POA: Diagnosis not present

## 2023-05-13 DIAGNOSIS — R040 Epistaxis: Secondary | ICD-10-CM

## 2023-05-13 LAB — CBC WITH DIFFERENTIAL/PLATELET
Basophils Absolute: 0.1 10*3/uL (ref 0.0–0.2)
Basos: 1 %
EOS (ABSOLUTE): 0.3 10*3/uL (ref 0.0–0.4)
Eos: 5 %
Hematocrit: 33.5 % — ABNORMAL LOW (ref 34.0–46.6)
Hemoglobin: 10.8 g/dL — ABNORMAL LOW (ref 11.1–15.9)
Immature Grans (Abs): 0 10*3/uL (ref 0.0–0.1)
Immature Granulocytes: 0 %
Lymphocytes Absolute: 1.8 10*3/uL (ref 0.7–3.1)
Lymphs: 27 %
MCH: 28.6 pg (ref 26.6–33.0)
MCHC: 32.2 g/dL (ref 31.5–35.7)
MCV: 89 fL (ref 79–97)
Monocytes Absolute: 0.4 10*3/uL (ref 0.1–0.9)
Monocytes: 6 %
Neutrophils Absolute: 4.2 10*3/uL (ref 1.4–7.0)
Neutrophils: 61 %
Platelets: 230 10*3/uL (ref 150–450)
RBC: 3.77 x10E6/uL (ref 3.77–5.28)
RDW: 14.6 % (ref 11.7–15.4)
WBC: 6.9 10*3/uL (ref 3.4–10.8)

## 2023-05-13 NOTE — Progress Notes (Signed)
 Subjective:  Darlene Wade is a 68 y.o. female who presents for Chief Complaint  Patient presents with   Epistaxis    Nose bleeds. Yesterday 3 times. Happens usually a couple times a day and everyday. So far, it hasn't happen today     Here for nosebleeds.  She has been having nosebleeds for months.  Initially they were intermittent but they have pretty much become daily at this time.  She saw ENT recently and had cauterization on the right side.  But she is still having frequent nosebleeds including 3 yesterday on the right side and some on the left.  ENT feels like it is coming from her Eliquis blood thinner.  She has no other bleeding or bruising.  Otherwise normal state of health  She is using bleed stop nasal tampons over-the-counter, direct pressure but it can take 30 to 40 minutes to get the nosebleed to stop  No other aggravating or relieving factors.    No other c/o.  The following portions of the patient's history were reviewed and updated as appropriate: allergies, current medications, past family history, past medical history, past social history, past surgical history and problem list.  ROS Otherwise as in subjective above    Objective: BP 110/70   Pulse 80   Wt 209 lb 3.2 oz (94.9 kg)   BMI 38.26 kg/m   General appearance: alert, no distress, well developed, well nourished There is some dried blood on the inside superior anterior portion of the right nare, no obvious erythema or bleeding on the left nare today Oral: No lesions, no bleeding Heart: Regular rate rhythm, normal S1-S2 Pulses within normal limits    Assessment: Encounter Diagnoses  Name Primary?   Nosebleed Yes   Paroxysmal atrial fibrillation (HCC)    High risk medication use    Anticoagulated    Chronic systolic CHF (congestive heart failure) (HCC)      Plan: We discussed her recent nosebleeds worsening.  She is on Eliquis anticoagulation.  Her bleeding has become a daily issue.  She is  already seeing ENT recently and had cauterization.  Sees ENT in follow-up next week.  We discussed other potential causes of bleeding as I suspect that is her anticoagulant causing the problem.  She has been on Eliquis for about 6 months and that is the timeframe she has had the nosebleed problem  I will reach out to her cardiologist to get their input as well.  Expect reply back from them today or tomorrow  I recommend she go down to 2.5 mg Eliquis twice daily instead of 5 mg Eliquis twice daily.  We discussed the whole purpose of anticoagulation, prevention of blood clots and stroke, but also discussed risk and benefits of medication.  We discussed other medication options as well.  She has not taken her morning dose but will take half a tablet this morning of Eliquis.  We gently applied some triple antibiotic in each nare this morning to help moisturize the inside of her nostrils.  We discussed acute treatment of the nosebleed.  She is not currently in A-fib today  She will follow-up with ENT next week as planned  Continue rest of medicines as usual  Darlene Wade was seen today for epistaxis.  Diagnoses and all orders for this visit:  Nosebleed -     CBC with Differential/Platelet  Paroxysmal atrial fibrillation (HCC)  High risk medication use  Anticoagulated  Chronic systolic CHF (congestive heart failure) (HCC)    Follow  up: Pending callback

## 2023-05-14 ENCOUNTER — Other Ambulatory Visit: Payer: Self-pay | Admitting: Medical

## 2023-05-14 ENCOUNTER — Encounter: Payer: Self-pay | Admitting: Cardiovascular Disease

## 2023-05-14 MED ORDER — APIXABAN 2.5 MG PO TABS: 2.5000 mg | ORAL_TABLET | Freq: Two times a day (BID) | ORAL | 0 refills | Status: DC

## 2023-05-14 NOTE — Progress Notes (Signed)
 I communicated with her cardiology office.  She definitely needs to be on some type of anticoagulation/blood thinner.  We discussed the risk of lowering the dose but she is continue to have nosebleeds  We are agreeable to go ahead and lower her down to 2.5 mg twice daily on the Eliquis  She should be getting a call from cardiologist office to get back in to discuss other ways to help prevent blood clot  I sent the new prescription for the Eliquis 2.5 mg twice daily  Follow-up with ENT next week as planned

## 2023-05-14 NOTE — Progress Notes (Signed)
 I received a secure chat message from Kristian Covey, [place] that Darlene Wade is having lots of nosebleeds.   hello Dr. [name].  I saw our mutual patient yesterday, Darlene Wade.  I sent you a message through staff message on her about whether would be okay to do 2.5 mg Eliquis twice a day as she has continued to have worsening nosebleeds, has already had cautery from ENT but still having nosebleeds.  She is currently on 5 mg twice a day I thought lowering the dose would be a reasonable option instead of changing to a different anticoagulant  I suppose its reasonable to try the lower dose.   I dont think changing to another DOAC or warfarin would be any better.  her CHADS2VASC score is 6 so she is at fairly high risk for thromboembolic events.   As long as she understands the risk involved, then we can give it a try.  I will refer her to EP for consideration for a watchman procedure.      Kristeen Miss, MD  05/14/2023 9:08 AM    [facility] [address] [place],  [address] [place], [place]  [address] Phone: [phone]; Fax: [phone]

## 2023-05-14 NOTE — Progress Notes (Signed)
 Pt viewed this already on her labs

## 2023-05-15 ENCOUNTER — Encounter: Payer: Self-pay | Admitting: Cardiovascular Disease

## 2023-05-15 NOTE — Progress Notes (Unsigned)
 Cardiology Office Note:    Date:  05/16/2023   ID:  Darlene Wade, DOB [birth year], MRN [id number]  PCP:  Jac Canavan, PA-C  Cardiologist:  New to [facility]  Electrophysiologist:  None   Referring MD: Jac Canavan, PA-C   Problem List 1. Diabetes 2. HTN 3. Hyperlipidemia  4.  Osteoarthritis  5.  Atrial fib  Chief Complaint  Patient presents with   Congestive Heart Failure          Dec. 2, 2019:    Darlene Wade is a 68 y.o. female with a hx of hypertension, hyperlipidemia, Diabetes  Her primary ordered on echo  She was found to have a very mildly reduced left ventricular systolic function ejection fraction of 40 to 45%.  She has grade 1 diastolic dysfunction.  Was exercising regularly in the gym but her knees started causing her lots of pain  Now she is walking 30 minutes a day  Drives a school bus   She does not eat  Lots of salt Has lost 40 lbs over the past several months - has been paying attention to her diet  Is also on Invokana   Nov. 23, 2021:  Darlene Wade is seen today for follow-up visit.  She has chronic combined systolic and diastolic congestive heart failure. Last echocardiogram was June, 2020.  She has a left ventricular systolic function with an EF of 40 to 45%.  At that time she had normal diastolic parameters. Has not gained any weight .   Trying to watch her diet .  Wt today is 217 lbs ( down 3 lbs since Dec. 2019. She has had knee surgery since then.   Thinking about getting the other knee done   Lipids are managed by her primary md, labs were reviewed ,  Look great  December 26, 2020: Darlene Wade seen today for follow-up of her chronic combined systolic and diastolic congestive heart failure.  Last echocardiogram was performed in June, 2020.  Her ejection fraction is 40 to 45%. Had n Right TKA in June, 2022  Is back exercising ,  no CP or dyspnea Is still rehabbing her knee.   She is in the donut hole currently.  We would like to start her on  Entresto but this may be cost prohibitive for now.  The plan is for her to start Entresto 49-51 p.o. twice daily starting on January 1.  She will stop the losartan at that time.  We will check a basic metabolic profile approximately 2 to 3 weeks after she starts the [place]. Anticipate that she will be able to take the Entresto until about October or November of next year at which time she will change back to losartan 100 mg a day.  I will see her in 6 months.  Anticipate getting an echocardiogram if she has been able to tolerate the Entresto.  Hopefully we will see some further improvement of her left ventricular function.  Aug. 4, 2023 Darlene Wade is seen for follow up of her CHF Is on entresto 49-51 bid Watches her salt Walks ( 11,000 steps a day ) Is on Farxiga Jardiance caused yeast infection  Will add spironolactone     Feb. 7 , 2025 Darlene Wade is seen for follow up of her CHF  Was found to have atrial fib by Robin Searing, NP since I last saw her  She is in NSR to day   May 16, 2023 Darlene Wade is seen for follow up of her CHF  Paroxysmal Atrial fib  She was having lots of severe nosebleeds on Eliquis 5 mg a day.  We discussed with her primary medical doctor.  We have reduced her Eliquis to 2.5 mg a day to see if this will help.  We have placed a referral to electrophysiology to consider Watchman procedure.  After discussing the whole issue she would like to try going back up to Eliquis 5 mg a day to see if she can tolerate that and at the same time reduce her risk of stroke.  I agree with having her try it again. She will she will try some needed nasal saline spray.  She will return to see ENT soon for follow-up.  If the bleeding becomes very severe then I suppose we can reduce the dose to 2.5 mg a day which should provide her with some protection while we are arranging for her to see electrophysiology to consider Watchman procedure.  CHADS2VASC score is 32 ( female, age > 48, CHF, HTN, DM, CAD  )  Past Medical History:  Diagnosis Date   Asthma 07/20/2018   one puff per day   Chronic combined systolic and diastolic heart failure (HCC) 03/03/2018   Echo 07/2018: EF 40-45, diff HK worse in Inf base, normal RVSF   Diabetes mellitus without complication (HCC) 2012   Diabetic eye exam (HCC)    [employer]   Dyspnea    Elbow fracture, right 2007   Former smoker    20 pack year history, quit 2010   Hyperlipidemia    Hypertension    Insomnia    Lung nodule    Chest CT 07/2018:  RLL nodule resolved.  3 mm subpleural LUL nodule.  Repeat in 1 year if high risk.    Obesity    PONV (postoperative nausea and vomiting) 2014   1 time   Wears glasses    reading    Past Surgical History:  Procedure Laterality Date   ABDOMINAL HYSTERECTOMY  1986   BREAST CYST ASPIRATION  2015   BREAST EXCISIONAL BIOPSY Left    BREAST LUMPECTOMY WITH NEEDLE LOCALIZATION Left 10/07/2012   Procedure: BREAST LUMPECTOMY WITH NEEDLE LOCALIZATION;  Surgeon: Wilmon Arms. Corliss Skains, MD;  Location: [facility];  Service: General;  Laterality: Left;   BREAST SURGERY     COLONOSCOPY  01/2016   01/2016 with Dr. Myrtie Neither, tubular adenoma polpy; 2003 with Dr. Loreta Ave   ELBOW ARTHROPLASTY  2006   rt-fx   FOOT ARTHROTOMY  12/2010   foot fusion, right   FOOT MASS EXCISION  04/2012   left-fusion   HYSTERECTOMY ABDOMINAL WITH SALPINGECTOMY Bilateral 1985   JOINT REPLACEMENT  08/10/2018   Total knee replacement   PARTIAL HYSTERECTOMY  age 1   uterine fibroids, still has ovaries   TONSILLECTOMY     TOTAL KNEE ARTHROPLASTY Left 08/10/2018   Procedure: Left Knee Arthroplasty;  Surgeon: Gean Birchwood, MD;  Location: [facility];  Service: Orthopedics;  Laterality: Left;    Current Medications: Current Meds  Medication Sig   Accu-Chek Softclix Lancets lancets Test 1-2 times daily   acetaminophen (TYLENOL) 650 MG CR tablet Take 650 mg by mouth as needed for pain.   apixaban (ELIQUIS) 5 MG TABS tablet Take 1 tablet (5  mg total) by mouth 2 (two) times daily.   atorvastatin (LIPITOR) 40 MG tablet Take 1 tablet by mouth once daily   Blood Glucose Monitoring Suppl (ACCU-CHEK GUIDE) w/Device KIT Use to check with test strips  carvedilol (COREG) 12.5 MG tablet Take 1 tablet (12.5 mg total) by mouth 2 (two) times daily with a meal.   Cholecalciferol (VITAMIN D) 50 MCG (2000 UT) CAPS Take 1 capsule (2,000 Units total) by mouth daily.   dapagliflozin propanediol (FARXIGA) 5 MG TABS tablet Take 1 tablet (5 mg total) by mouth daily before breakfast.   gabapentin (NEURONTIN) 300 MG capsule Take 300 mg by mouth at bedtime.   glucose blood (ACCU-CHEK GUIDE) test strip Test 1-2 times daily   Multiple Vitamin (MULTIVITAMIN) tablet Take 1 tablet by mouth daily.   sacubitril-valsartan (ENTRESTO) 49-51 MG Take 1 tablet by mouth twice daily   sitaGLIPtin-metformin (JANUMET) 50-500 MG tablet Take 1 tablet by mouth daily with breakfast.   spironolactone (ALDACTONE) 25 MG tablet Take 1 tablet by mouth once daily   [DISCONTINUED] apixaban (ELIQUIS) 2.5 MG TABS tablet Take 1 tablet (2.5 mg total) by mouth 2 (two) times daily.     Allergies:   Jardiance [empagliflozin] and Naproxen   Social History   Socioeconomic History   Marital status: Married    Spouse name: Not on file   Number of children: 1   Years of education: Not on file   Highest education level: GED or equivalent  Occupational History   Occupation: School bus driver  Tobacco Use   Smoking status: Former    Current packs/day: 0.00    Average packs/day: 1 pack/day for 20.0 years (20.0 ttl pk-yrs)    Types: Cigarettes    Start date: 04/11/1989    Quit date: 04/11/2009    Years since quitting: 14.1   Smokeless tobacco: Never  Vaping Use   Vaping status: Never Used  Substance and Sexual Activity   Alcohol use: Not Currently    Alcohol/week: 1.0 standard drink of alcohol    Comment: rare   Drug use: No   Sexual activity: Yes    Birth control/protection:  Other-see comments    Comment: Hysterectomy  Other Topics Concern   Not on file  Social History Narrative   Separated from husband (2013). Living with husband.  Walking for exercise 2-3 x per week.  Retired, Herbalist in the past.  Has 2 grandchildren, teenagers.   Driving school bus.   [id number]   Social Drivers of Health   Financial Resource Strain: Low Risk  (02/04/2023)   Overall Financial Resource Strain (CARDIA)    Difficulty of Paying Living Expenses: Not hard at all  Food Insecurity: No Food Insecurity (05/13/2023)   Hunger Vital Sign    Worried About Running Out of Food in the Last Year: Never true    Ran Out of Food in the Last Year: Never true  Transportation Needs: No Transportation Needs (05/13/2023)   PRAPARE - Administrator, Civil Service (Medical): No    Lack of Transportation (Non-Medical): No  Physical Activity: Unknown (05/13/2023)   Exercise Vital Sign    Days of Exercise per Week: Patient declined    Minutes of Exercise per Session: Patient unable to answer  Stress: No Stress Concern Present (02/04/2023)   [employer] of Occupational Health - Occupational Stress Questionnaire    Feeling of Stress : Not at all  Social Connections: Unknown (05/13/2023)   Social Connection and Isolation Panel [NHANES]    Frequency of Communication with Friends and Family: More than three times a week    Frequency of Social Gatherings with Friends and Family: Twice a week    Attends Religious Services: Patient declined  Active Member of Clubs or Organizations: Yes    Attends Banker Meetings: Patient unable to answer    Marital Status: Married     Family History: The patient's family history includes Diabetes in her maternal aunt and mother; Heart disease in her maternal grandfather; Hypertension in her mother. There is no history of Cancer, Breast cancer, Colon cancer, Stomach cancer, or Esophageal cancer.  ROS:   Please see the history of present  illness.     All other systems reviewed and are negative.  EKGs/Labs/Other Studies Reviewed:    The following studies were reviewed today:    Recent Labs: 09/24/2022: Magnesium 1.6; TSH 1.480 10/24/2022: BUN 21; Creatinine, Ser 1.04; Potassium 4.5; Sodium 139 12/18/2022: ALT 7 05/13/2023: Hemoglobin 10.8; Platelets 230  Recent Lipid Panel    Component Value Date/Time   CHOL 164 12/18/2022 1020   TRIG 90 12/18/2022 1020   HDL 52 12/18/2022 1020   CHOLHDL 3.2 12/18/2022 1020   CHOLHDL 2.0 05/22/2016 1014   VLDL 10 05/22/2016 1014   LDLCALC 95 12/18/2022 1020    Physical Exam:    Physical Exam: Blood pressure (!) 114/58, pulse (!) 56, height 5\' 2"  (1.575 m), weight 207 lb 3.2 oz (94 kg), SpO2 97%.  GEN:  Well nourished, well developed in no acute distress HEENT: Normal NECK: No JVD; No carotid bruits LYMPHATICS: No lymphadenopathy CARDIAC: RRR , no murmurs, rubs, gallops RESPIRATORY:  Clear to auscultation without rales, wheezing or rhonchi  ABDOMEN: Soft, non-tender, non-distended MUSCULOSKELETAL:  No edema; No deformity  SKIN: Warm and dry NEUROLOGIC:  Alert and oriented x 3     EKG:           ASSESSMENT:    1. Epistaxis   2. Paroxysmal atrial fibrillation (HCC)   3. Hypercoagulable state due to paroxysmal atrial fibrillation (HCC)        PLAN:       1.  Mild chronic systolic  congestive heart failure:   Stable    2.  Hyperlipidemia: has minimal , non obstructive CAD . LDL is 95  Continue atorvastatin  40 mg a day.    3.  Obesity:      4.  Paroxysmal atrial fibrillation:   CHADS2VASC score is 1 ( female, age > 69, CHF, HTN, DM, CAD ) After we discussed the issue, She will try eliquis 5 mg BID again to lower her risk of stroke.   She is going back to ENT soon,  will use nasal saline spray .     Medication Adjustments/Labs and Tests Ordered: Current medicines are reviewed at length with the patient today.  Concerns regarding medicines are outlined  above.  Orders Placed This Encounter  Procedures   Ambulatory referral to Cardiac Electrophysiology    Meds ordered this encounter  Medications   apixaban (ELIQUIS) 5 MG TABS tablet    Sig: Take 1 tablet (5 mg total) by mouth 2 (two) times daily.    Dispense:  180 tablet    Refill:  3     Patient Instructions  Medication Instructions:  INCREASE Eliquis to 5mg  twice daily *If you need a refill on your cardiac medications before your next appointment, please call your pharmacy*  Testing/Procedures: Referral to Electrophysiology for Watchman (LAAO)  Follow-Up: At [facility], you and your health needs are our priority.  As part of our continuing mission to provide you with exceptional heart care, our providers are all part of one team.  This team  includes your primary Cardiologist (physician) and Advanced Practice Providers or APPs (Physician Assistants and Nurse Practitioners) who all work together to provide you with the care you need, when you need it.  Your next appointment:   6 month(s)  Provider:   Kristeen Miss, MD     1st Floor: - Lobby - Registration  - Pharmacy  - Lab - Cafe  2nd Floor: - PV Lab - Diagnostic Testing (echo, CT, nuclear med)  3rd Floor: - Vacant  4th Floor: - TCTS (cardiothoracic surgery) - AFib Clinic - Structural Heart Clinic - Vascular Surgery  - Vascular Ultrasound  5th Floor: - HeartCare Cardiology (general and EP) - Clinical Pharmacy for coumadin, hypertension, lipid, weight-loss medications, and med management appointments    Valet parking services will be available as well.     Signed, Kristeen Miss, MD  05/16/2023 2:08 PM    [facility]

## 2023-05-16 ENCOUNTER — Encounter: Payer: Self-pay | Admitting: Cardiovascular Disease

## 2023-05-16 ENCOUNTER — Ambulatory Visit: Attending: Internal Medicine | Admitting: Cardiovascular Disease

## 2023-05-16 VITALS — BP 114/58 | HR 56 | Ht 62.0 in | Wt 207.2 lb

## 2023-05-16 DIAGNOSIS — D6869 Other thrombophilia: Secondary | ICD-10-CM | POA: Diagnosis not present

## 2023-05-16 DIAGNOSIS — I480 Paroxysmal atrial fibrillation: Secondary | ICD-10-CM | POA: Diagnosis not present

## 2023-05-16 DIAGNOSIS — R040 Epistaxis: Secondary | ICD-10-CM | POA: Diagnosis not present

## 2023-05-16 MED ORDER — APIXABAN 5 MG PO TABS: 5.0000 mg | ORAL_TABLET | Freq: Two times a day (BID) | ORAL | 3 refills | Status: DC

## 2023-05-16 NOTE — Patient Instructions (Signed)
 Medication Instructions:  INCREASE Eliquis to 5mg  twice daily *If you need a refill on your cardiac medications before your next appointment, please call your pharmacy*  Testing/Procedures: Referral to Electrophysiology for Watchman (LAAO)  Follow-Up: At [facility], you and your health needs are our priority.  As part of our continuing mission to provide you with exceptional heart care, our providers are all part of one team.  This team includes your primary Cardiologist (physician) and Advanced Practice Providers or APPs (Physician Assistants and Nurse Practitioners) who all work together to provide you with the care you need, when you need it.  Your next appointment:   6 month(s)  Provider:   Kristeen Miss, MD     1st Floor: - Lobby - Registration  - Pharmacy  - Lab - Cafe  2nd Floor: - PV Lab - Diagnostic Testing (echo, CT, nuclear med)  3rd Floor: - Vacant  4th Floor: - TCTS (cardiothoracic surgery) - AFib Clinic - Structural Heart Clinic - Vascular Surgery  - Vascular Ultrasound  5th Floor: - HeartCare Cardiology (general and EP) - Clinical Pharmacy for coumadin, hypertension, lipid, weight-loss medications, and med management appointments    Valet parking services will be available as well.

## 2023-05-20 ENCOUNTER — Ambulatory Visit (INDEPENDENT_AMBULATORY_CARE_PROVIDER_SITE_OTHER)

## 2023-05-20 ENCOUNTER — Encounter (INDEPENDENT_AMBULATORY_CARE_PROVIDER_SITE_OTHER): Payer: Self-pay | Admitting: Otolaryngology

## 2023-05-20 ENCOUNTER — Encounter (INDEPENDENT_AMBULATORY_CARE_PROVIDER_SITE_OTHER): Payer: Self-pay

## 2023-05-20 VITALS — BP 106/69 | HR 86 | Ht 62.0 in | Wt 207.0 lb

## 2023-05-20 DIAGNOSIS — R040 Epistaxis: Secondary | ICD-10-CM | POA: Diagnosis not present

## 2023-05-20 NOTE — H&P (View-Only) (Signed)
 Electrophysiology Office Note:    Date:  05/21/2023   ID:  Darlene Wade, DOB 30-Jun-1955, MRN [id number]  [facility] Cardiologist:  None  [facility] Electrophysiologist:  Lanier Prude, MD   Referring MD: Jac Canavan, PA-C   Chief Complaint: AF  History of Present Illness:    Darlene Wade is a 68 year old woman who I am seeing today for an evaluation of atrial fibrillation at the request of Dr. [name].  The patient has a history of diabetes, hypertension, hyperlipidemia and atrial fibrillation.  She also has a history of chronic systolic heart failure with an ejection fraction of 40%.  She last saw Dr. [name] May 16, 2023.  She has had trouble tolerating Eliquis because of recurrent severe nosebleeds.  She is on an inappropriately low dose of Eliquis to see if this would help.  She is being referred for consideration of left atrial appendage occlusion.  She has a hard time feeling when she is in atrial fibrillation.  She tells me that she is still taking 5 mg by mouth twice daily of Eliquis.  She has not had a nosebleed in 3 days.  Before the last 3 days she was experiencing at least 2-3 episodes of nosebleeds per day.  She is required treatment by an ENT on multiple occasions for bleeding blood vessels in the right nare.     Their past medical, social and family history was reviewed.   ROS:   Please see the history of present illness.    All other systems reviewed and are negative.  EKGs/Labs/Other Studies Reviewed:    The following studies were reviewed today:  November 11, 2022 echo EF 40% RV normal No MR  October 03, 2022 EKG shows sinus rhythm.  EKG Interpretation Date/Time:  Wednesday May 21 2023 11:08:25 EDT Ventricular Rate:  88 PR Interval:  134 QRS Duration:  76 QT Interval:  336 QTC Calculation: 406 R Axis:   -14  Text Interpretation: Sinus rhythm with Premature atrial complexes Confirmed by Steffanie Dunn [phone]) on 05/21/2023 11:29:27 AM     Physical Exam:    VS:  BP 110/64   Pulse 88   Ht 5\' 2"  (1.575 m)   Wt 208 lb (94.3 kg)   SpO2 98%   BMI 38.04 kg/m     Wt Readings from Last 3 Encounters:  05/21/23 208 lb (94.3 kg)  05/20/23 207 lb (93.9 kg)  05/16/23 207 lb 3.2 oz (94 kg)     GEN: no distress.  Obese CARD: RRR, No MRG RESP: No IWOB. CTAB.        ASSESSMENT AND PLAN:    1. Paroxysmal atrial fibrillation (HCC)   2. Epistaxis   3. Chronic combined systolic and diastolic heart failure (HCC)     #Atrial fibrillation #Recurrent epistaxis The patient has a history of atrial fibrillation.  She has previously reduced her dose of Eliquis because of severe, recurrent nosebleeds.  She is now back on full-strength anticoagulation and has not had a nosebleed in 3 days although she is concerned about the risk moving forward.  She sees an ENT physician who has treated multiple bleeding blood vessels in her right nare.  We discussed left atrial appendage occlusion today as a mechanism to reduce her stroke risk and avoid long-term/indefinite exposure to full-strength anticoagulation.  I discussed the procedure, risks, likelihood of success.  ----------------------  I discussed rhythm control strategies for her atrial fibrillation during today's clinic appointment.  We discussed antiarrhythmic drugs  and catheter ablation.  We discussed the possibility of doing a concomitant ablation and Watchman procedure.  Discussed treatment options today for AF including antiarrhythmic drug therapy and ablation. Discussed risks, recovery and likelihood of success with each treatment strategy. Risk, benefits, and alternatives to EP study and ablation for afib were discussed. These risks include but are not limited to stroke, bleeding, vascular damage, tamponade, perforation, damage to the esophagus, lungs, phrenic nerve and other structures, pulmonary vein stenosis, worsening renal function, coronary vasospasm and death.  Discussed  potential need for repeat ablation procedures and antiarrhythmic drugs after an initial ablation. The patient understands these risk and wishes to proceed.  We will therefore proceed with catheter ablation at the next available time.  Carto, ICE, anesthesia are requested for the procedure.  Will also obtain CT PV protocol prior to the procedure to exclude LAA thrombus and further evaluate atrial anatomy.  -------------------    I have seen Darlene Wade in the office today who is being considered for a Watchman left atrial appendage closure device. I believe they will benefit from this procedure given their history of atrial fibrillation, CHA2DS2-VASc score of 5 and unadjusted ischemic stroke rate of 7.2% per year. Unfortunately, the patient is not felt to be a long term anticoagulation candidate secondary to recurrent severe epistaxis. The patient's chart has been reviewed and I feel that they would be a candidate for short term oral anticoagulation after Watchman implant.   It is my belief that after undergoing a LAA closure procedure, Darlene Wade will not need long term anticoagulation which eliminates anticoagulation side effects and major bleeding risk.   Procedural risks for the Watchman implant have been reviewed with the patient including a 0.5% risk of stroke, <1% risk of perforation and <1% risk of device embolization. Other risks include bleeding, vascular damage, tamponade, worsening renal function, and death. The patient understands these risk and wishes to proceed.     The published clinical data on the safety and effectiveness of WATCHMAN include but are not limited to the following: - Holmes DR, Everlene Farrier, Sick P et al. for the PROTECT AF Investigators. Percutaneous closure of the left atrial appendage versus warfarin therapy for prevention of stroke in patients with atrial fibrillation: a randomised non-inferiority trial. Lancet 2009; 374: 534-42. Everlene Farrier, Doshi SK, Isa Rankin D et al. on behalf of the PROTECT AF Investigators. Percutaneous Left Atrial Appendage Closure for Stroke Prophylaxis in Patients With Atrial Fibrillation 2.3-Year Follow-up of the PROTECT AF (Watchman Left Atrial Appendage System for Embolic Protection in Patients With Atrial Fibrillation) Trial. Circulation 2013; 127:720-729. - Alli O, Doshi S,  Kar S, Reddy VY, Sievert H et al. Quality of Life Assessment in the Randomized PROTECT AF (Percutaneous Closure of the Left Atrial Appendage Versus Warfarin Therapy for Prevention of Stroke in Patients With Atrial Fibrillation) Trial of Patients at Risk for Stroke With Nonvalvular Atrial Fibrillation. J Am Coll Cardiol 2013; 61:1790-8. Aline August DR, Mia Creek, Price M, Whisenant B, Sievert H, Doshi S, Huber K, Reddy V. Prospective randomized evaluation of the Watchman left atrial appendage Device in patients with atrial fibrillation versus long-term warfarin therapy; the PREVAIL trial. Journal of the [employer] of Cardiology, Vol. 4, No. 1, 2014, 1-11. - Kar S, Doshi SK, Sadhu A, Horton R, Osorio J et al. Primary outcome evaluation of a next-generation left atrial appendage closure device: results from the PINNACLE FLX trial. Circulation 2021;143(18)1754-1762.    After today's visit with  the patient which was dedicated solely for shared decision making visit regarding LAA closure device, the patient decided to proceed with the LAA appendage closure procedure scheduled to be done in the near future at [facility]. Prior to the procedure, I would like to obtain a gated CT scan of the chest with contrast timed for PV/LA visualization. Marland Kitchen  HAS-BLED score 3 Hypertension Yes  Abnormal renal and liver function (Dialysis, transplant, Cr >2.26 mg/dL /Cirrhosis or Bilirubin >2x Normal or AST/ALT/AP >3x Normal) No  Stroke No  Bleeding Yes  Labile INR (Unstable/high INR) No  Elderly (>65) Yes  Drugs or alcohol (>= 8 drinks/week, anti-plt or NSAID) No    CHA2DS2-VASc Score = 5  The patient's score is based upon: CHF History: 1 HTN History: 1 Diabetes History: 1 Stroke History: 0 Vascular Disease History: 0 Age Score: 1 Gender Score: 1    Will plan to perform a concomitant procedure but if not approved by insurance, we will perform staged procedure with 6 to 8 weeks between phases.    Signed, Rossie Muskrat. Lalla Brothers, MD, [facility], [facility] 05/21/2023 11:29 AM    Electrophysiology [facility]

## 2023-05-20 NOTE — Progress Notes (Unsigned)
 Patient ID: ESSENCE MERLE, female   DOB: 22-Dec-1955, 68 y.o.   MRN: [id number]  Follow-up: Recurrent right epistaxis  Procedure:  Endoscopic control of recurrent right epistaxis  Indication:  The patient is a 68 year old female who returns today for her follow-up evaluation.  The patient was last seen in March 2025.  At that time, she was complaining of recurrent right epistaxis.  She was treated with endoscopic cauterization of her right nasal septum.  The patient returns today complaining of more bleeding over the past month.  At one point, she was having daily bleeding episodes.  She denies any recent nasal trauma.  She is currently on Eliquis.  Description:  The right nasal cavity is sprayed with topical xylocaine and neo-synephrine.  After adequate anesthesia is achieved, the nasal cavity is examined with a 0 rigid endoscope.  A suction catheter is inserted in parallel with the 0 endoscope, and it is used to suction blood clots from the right nasal cavity.  Hypervascular areas are noted at the anterior and superior aspect of the nasal septum.  A silver nitrate stick is inserted in parallel with the 0 endoscope.  It is used to repeatedly cauterized the hypervascular areas.  Good hemostasis is achieved.  The patient tolerated the procedure well.  Assessment: 1.  Hypervascular areas are again noted on the right anterior and superior nasal septum.   2.  Active bleeding is noted today.    Plan: 1. Endoscopic cauterization of the right anterior and superior nasal septum. 2. The nasal endoscopy findings are reviewed with the patient. 3. Nasal ointment/humidifier to treat the nasal dryness. 4. The patient will return for re-evaluation in 6 weeks.

## 2023-05-20 NOTE — Progress Notes (Unsigned)
 Electrophysiology Office Note:    Date:  05/21/2023   ID:  Darlene Wade, DOB 30-Jun-1955, MRN [id number]  [facility] Cardiologist:  None  [facility] Electrophysiologist:  Lanier Prude, MD   Referring MD: Jac Canavan, PA-C   Chief Complaint: AF  History of Present Illness:    Darlene Wade is a 68 year old woman who I am seeing today for an evaluation of atrial fibrillation at the request of Dr. [name].  The patient has a history of diabetes, hypertension, hyperlipidemia and atrial fibrillation.  She also has a history of chronic systolic heart failure with an ejection fraction of 40%.  She last saw Dr. [name] May 16, 2023.  She has had trouble tolerating Eliquis because of recurrent severe nosebleeds.  She is on an inappropriately low dose of Eliquis to see if this would help.  She is being referred for consideration of left atrial appendage occlusion.  She has a hard time feeling when she is in atrial fibrillation.  She tells me that she is still taking 5 mg by mouth twice daily of Eliquis.  She has not had a nosebleed in 3 days.  Before the last 3 days she was experiencing at least 2-3 episodes of nosebleeds per day.  She is required treatment by an ENT on multiple occasions for bleeding blood vessels in the right nare.     Their past medical, social and family history was reviewed.   ROS:   Please see the history of present illness.    All other systems reviewed and are negative.  EKGs/Labs/Other Studies Reviewed:    The following studies were reviewed today:  November 11, 2022 echo EF 40% RV normal No MR  October 03, 2022 EKG shows sinus rhythm.  EKG Interpretation Date/Time:  Wednesday May 21 2023 11:08:25 EDT Ventricular Rate:  88 PR Interval:  134 QRS Duration:  76 QT Interval:  336 QTC Calculation: 406 R Axis:   -14  Text Interpretation: Sinus rhythm with Premature atrial complexes Confirmed by Steffanie Dunn [phone]) on 05/21/2023 11:29:27 AM     Physical Exam:    VS:  BP 110/64   Pulse 88   Ht 5\' 2"  (1.575 m)   Wt 208 lb (94.3 kg)   SpO2 98%   BMI 38.04 kg/m     Wt Readings from Last 3 Encounters:  05/21/23 208 lb (94.3 kg)  05/20/23 207 lb (93.9 kg)  05/16/23 207 lb 3.2 oz (94 kg)     GEN: no distress.  Obese CARD: RRR, No MRG RESP: No IWOB. CTAB.        ASSESSMENT AND PLAN:    1. Paroxysmal atrial fibrillation (HCC)   2. Epistaxis   3. Chronic combined systolic and diastolic heart failure (HCC)     #Atrial fibrillation #Recurrent epistaxis The patient has a history of atrial fibrillation.  She has previously reduced her dose of Eliquis because of severe, recurrent nosebleeds.  She is now back on full-strength anticoagulation and has not had a nosebleed in 3 days although she is concerned about the risk moving forward.  She sees an ENT physician who has treated multiple bleeding blood vessels in her right nare.  We discussed left atrial appendage occlusion today as a mechanism to reduce her stroke risk and avoid long-term/indefinite exposure to full-strength anticoagulation.  I discussed the procedure, risks, likelihood of success.  ----------------------  I discussed rhythm control strategies for her atrial fibrillation during today's clinic appointment.  We discussed antiarrhythmic drugs  and catheter ablation.  We discussed the possibility of doing a concomitant ablation and Watchman procedure.  Discussed treatment options today for AF including antiarrhythmic drug therapy and ablation. Discussed risks, recovery and likelihood of success with each treatment strategy. Risk, benefits, and alternatives to EP study and ablation for afib were discussed. These risks include but are not limited to stroke, bleeding, vascular damage, tamponade, perforation, damage to the esophagus, lungs, phrenic nerve and other structures, pulmonary vein stenosis, worsening renal function, coronary vasospasm and death.  Discussed  potential need for repeat ablation procedures and antiarrhythmic drugs after an initial ablation. The patient understands these risk and wishes to proceed.  We will therefore proceed with catheter ablation at the next available time.  Carto, ICE, anesthesia are requested for the procedure.  Will also obtain CT PV protocol prior to the procedure to exclude LAA thrombus and further evaluate atrial anatomy.  -------------------    I have seen [name] in the office today who is being considered for a Watchman left atrial appendage closure device. I believe they will benefit from this procedure given their history of atrial fibrillation, CHA2DS2-VASc score of 5 and unadjusted ischemic stroke rate of 7.2% per year. Unfortunately, the patient is not felt to be a long term anticoagulation candidate secondary to recurrent severe epistaxis. The patient's chart has been reviewed and I feel that they would be a candidate for short term oral anticoagulation after Watchman implant.   It is my belief that after undergoing a LAA closure procedure, Darlene Wade will not need long term anticoagulation which eliminates anticoagulation side effects and major bleeding risk.   Procedural risks for the Watchman implant have been reviewed with the patient including a 0.5% risk of stroke, <1% risk of perforation and <1% risk of device embolization. Other risks include bleeding, vascular damage, tamponade, worsening renal function, and death. The patient understands these risk and wishes to proceed.     The published clinical data on the safety and effectiveness of WATCHMAN include but are not limited to the following: - Holmes DR, Everlene Farrier, Sick P et al. for the PROTECT AF Investigators. Percutaneous closure of the left atrial appendage versus warfarin therapy for prevention of stroke in patients with atrial fibrillation: a randomised non-inferiority trial. Lancet 2009; 374: 534-42. Everlene Farrier, Doshi SK, Isa Rankin D et al. on behalf of the PROTECT AF Investigators. Percutaneous Left Atrial Appendage Closure for Stroke Prophylaxis in Patients With Atrial Fibrillation 2.3-Year Follow-up of the PROTECT AF (Watchman Left Atrial Appendage System for Embolic Protection in Patients With Atrial Fibrillation) Trial. Circulation 2013; 127:720-729. - Alli O, Doshi S,  Kar S, Reddy VY, Sievert H et al. Quality of Life Assessment in the Randomized PROTECT AF (Percutaneous Closure of the Left Atrial Appendage Versus Warfarin Therapy for Prevention of Stroke in Patients With Atrial Fibrillation) Trial of Patients at Risk for Stroke With Nonvalvular Atrial Fibrillation. J Am Coll Cardiol 2013; 61:1790-8. Aline August DR, Mia Creek, Price M, Whisenant B, Sievert H, Doshi S, Huber K, Reddy V. Prospective randomized evaluation of the Watchman left atrial appendage Device in patients with atrial fibrillation versus long-term warfarin therapy; the PREVAIL trial. Journal of the [employer] of Cardiology, Vol. 4, No. 1, 2014, 1-11. - Kar S, Doshi SK, Sadhu A, Horton R, Osorio J et al. Primary outcome evaluation of a next-generation left atrial appendage closure device: results from the PINNACLE FLX trial. Circulation 2021;143(18)1754-1762.    After today's visit with  the patient which was dedicated solely for shared decision making visit regarding LAA closure device, the patient decided to proceed with the LAA appendage closure procedure scheduled to be done in the near future at [facility]. Prior to the procedure, I would like to obtain a gated CT scan of the chest with contrast timed for PV/LA visualization. Marland Kitchen  HAS-BLED score 3 Hypertension Yes  Abnormal renal and liver function (Dialysis, transplant, Cr >2.26 mg/dL /Cirrhosis or Bilirubin >2x Normal or AST/ALT/AP >3x Normal) No  Stroke No  Bleeding Yes  Labile INR (Unstable/high INR) No  Elderly (>65) Yes  Drugs or alcohol (>= 8 drinks/week, anti-plt or NSAID) No    CHA2DS2-VASc Score = 5  The patient's score is based upon: CHF History: 1 HTN History: 1 Diabetes History: 1 Stroke History: 0 Vascular Disease History: 0 Age Score: 1 Gender Score: 1    Will plan to perform a concomitant procedure but if not approved by insurance, we will perform staged procedure with 6 to 8 weeks between phases.    Signed, Rossie Muskrat. Lalla Brothers, MD, [facility], [facility] 05/21/2023 11:29 AM    Electrophysiology [facility]

## 2023-05-21 ENCOUNTER — Other Ambulatory Visit: Payer: Self-pay

## 2023-05-21 ENCOUNTER — Telehealth: Payer: Self-pay

## 2023-05-21 ENCOUNTER — Encounter: Payer: Self-pay | Admitting: Cardiology

## 2023-05-21 ENCOUNTER — Ambulatory Visit: Attending: Cardiology | Admitting: Cardiology

## 2023-05-21 VITALS — BP 110/64 | HR 88 | Ht 62.0 in | Wt 208.0 lb

## 2023-05-21 DIAGNOSIS — R040 Epistaxis: Secondary | ICD-10-CM

## 2023-05-21 DIAGNOSIS — I480 Paroxysmal atrial fibrillation: Secondary | ICD-10-CM

## 2023-05-21 DIAGNOSIS — I5042 Chronic combined systolic (congestive) and diastolic (congestive) heart failure: Secondary | ICD-10-CM | POA: Diagnosis not present

## 2023-05-21 NOTE — Telephone Encounter (Signed)
 The patient wishes to proceed with concomitant LAAO on 06/05/2023. cCT scheduled 05/27/2023. Instructions to be sent via MyChart. She was grateful for call and agreed with plan.

## 2023-05-21 NOTE — Telephone Encounter (Signed)
 Left message to call back

## 2023-05-21 NOTE — Telephone Encounter (Signed)
-----   Message from Nurse Kinnie Feil C sent at 05/21/2023 11:34 AM EDT ----- Dr. Lalla Brothers would like to do a concomitant ablation/WM for this patient. If not able to be done then he is okay with staging them. Just let me know!! CT has been ordered.   Thanks! Carly

## 2023-05-21 NOTE — Patient Instructions (Addendum)
 Medication Instructions:  Your physician recommends that you continue on your current medications as directed. Please refer to the Current Medication list given to you today.  *If you need a refill on your cardiac medications before your next appointment, please call your pharmacy*  Testing/Procedures: Cardiac CT Your physician has requested that you have cardiac CT. Cardiac computed tomography (CT) is a painless test that uses an x-ray machine to take clear, detailed pictures of your heart. For further information please visit [email or web address]   Ablation  Your physician has recommended that you have an ablation. Catheter ablation is a medical procedure used to treat some cardiac arrhythmias (irregular heartbeats). During catheter ablation, a long, thin, flexible tube is put into a blood vessel in your groin (upper thigh), or neck. This tube is called an ablation catheter. It is then guided to your heart through the blood vessel. Radio frequency waves destroy small areas of heart tissue where abnormal heartbeats may cause an arrhythmia to start.   Watchman  Your physician has requested that you have Left atrial appendage (LAA) closure device implantation is a procedure to put a small device in the LAA of the heart. The LAA is a small sac in the wall of the heart's left upper chamber. Blood clots can form in this area. The device, Watchman closes the LAA to help prevent a blood clot and stroke.    Follow-Up: At [facility], you and your health needs are our priority.  As part of our continuing mission to provide you with exceptional heart care, our providers are all part of one team.  This team includes your primary Cardiologist (physician) and Advanced Practice Providers or APPs (Physician Assistants and Nurse Practitioners) who all work together to provide you with the care you need, when you need it.  Your next appointment:   You will be contacted by Nurse Navigator, Karsten Fells to  schedule your pre-procedure visit and procedure date. If you have any questions she can be reached at [phone].

## 2023-05-22 DIAGNOSIS — M25511 Pain in right shoulder: Secondary | ICD-10-CM | POA: Diagnosis not present

## 2023-05-26 LAB — BASIC METABOLIC PANEL WITH GFR
BUN/Creatinine Ratio: 15 (ref 12–28)
BUN: 16 mg/dL (ref 8–27)
CO2: 24 mmol/L (ref 20–29)
Calcium: 9.8 mg/dL (ref 8.7–10.3)
Chloride: 104 mmol/L (ref 96–106)
Creatinine, Ser: 1.06 mg/dL — ABNORMAL HIGH (ref 0.57–1.00)
Glucose: 127 mg/dL — ABNORMAL HIGH (ref 70–99)
Potassium: 4.5 mmol/L (ref 3.5–5.2)
Sodium: 140 mmol/L (ref 134–144)
eGFR: 57 mL/min/{1.73_m2} — ABNORMAL LOW (ref >59)

## 2023-05-27 ENCOUNTER — Telehealth: Payer: Self-pay

## 2023-05-27 ENCOUNTER — Ambulatory Visit (HOSPITAL_COMMUNITY)
Admission: RE | Admit: 2023-05-27 | Discharge: 2023-05-27 | Disposition: A | Discharge status: Home / Self Care | Source: Ambulatory Visit | Attending: Medical | Admitting: Medical

## 2023-05-27 DIAGNOSIS — I5042 Chronic combined systolic (congestive) and diastolic (congestive) heart failure: Secondary | ICD-10-CM | POA: Diagnosis not present

## 2023-05-27 DIAGNOSIS — R040 Epistaxis: Secondary | ICD-10-CM | POA: Insufficient documentation

## 2023-05-27 DIAGNOSIS — I480 Paroxysmal atrial fibrillation: Secondary | ICD-10-CM | POA: Insufficient documentation

## 2023-05-27 MED ORDER — IOHEXOL 350 MG/ML SOLN
100.0000 mL | Freq: Once | INTRAVENOUS | Status: AC | PRN
  Administered 2023-05-27: 100 mL via INTRAVENOUS

## 2023-05-27 NOTE — Telephone Encounter (Signed)
 Darlene Wade

## 2023-05-28 ENCOUNTER — Telehealth: Payer: Self-pay

## 2023-05-28 NOTE — Telephone Encounter (Signed)
 Jorene New: Max 29/ AVG 24.5/ Depth 16 Likely use a 31mm device  Inf/Mid TSP RAO 19 CAU 20

## 2023-06-03 ENCOUNTER — Telehealth: Payer: Self-pay

## 2023-06-03 NOTE — Telephone Encounter (Signed)
 Confirmed procedure date of 06/05/2023. Confirmed arrival time of 1100 for procedure time at 1330. Reviewed pre-procedure instructions with patient. Confirmed she has no contrast allergy, no PPM or defibrillator.    Upon chart review, the patient's CT over read reported "nonspecific infectious or inflammatory bronchitis." The patient reported no fever, cough, and generally reports she feels "good."  Will route to Dr. Marven Slimmer to confirm OK to proceed with LAAO on 4/24 given incidentals.

## 2023-06-04 NOTE — Telephone Encounter (Signed)
 Confirmed with Dr. Marven Slimmer it is OK to proceed with concomitant PVI/LAAO tomorrow.

## 2023-06-05 ENCOUNTER — Other Ambulatory Visit: Payer: Self-pay

## 2023-06-05 ENCOUNTER — Encounter (HOSPITAL_COMMUNITY)
Admission: RE | Disposition: A | Discharge status: Home / Self Care | Payer: Self-pay | Source: Home / Self Care | Attending: Cardiology

## 2023-06-05 ENCOUNTER — Inpatient Hospital Stay (HOSPITAL_COMMUNITY)
Admission: RE | Admit: 2023-06-05 | Discharge: 2023-06-06 | DRG: 317 | Disposition: A | Discharge status: Home / Self Care | Attending: Cardiology | Admitting: Cardiology

## 2023-06-05 ENCOUNTER — Encounter (HOSPITAL_COMMUNITY): Payer: Self-pay | Admitting: Cardiology

## 2023-06-05 ENCOUNTER — Inpatient Hospital Stay (HOSPITAL_COMMUNITY): Admitting: Anesthesiology

## 2023-06-05 ENCOUNTER — Inpatient Hospital Stay (HOSPITAL_COMMUNITY)

## 2023-06-05 DIAGNOSIS — E119 Type 2 diabetes mellitus without complications: Secondary | ICD-10-CM | POA: Diagnosis not present

## 2023-06-05 DIAGNOSIS — Z006 Encounter for examination for normal comparison and control in clinical research program: Secondary | ICD-10-CM

## 2023-06-05 DIAGNOSIS — Z888 Allergy status to other drugs, medicaments and biological substances status: Secondary | ICD-10-CM | POA: Diagnosis not present

## 2023-06-05 DIAGNOSIS — J45909 Unspecified asthma, uncomplicated: Secondary | ICD-10-CM | POA: Diagnosis not present

## 2023-06-05 DIAGNOSIS — E785 Hyperlipidemia, unspecified: Secondary | ICD-10-CM | POA: Diagnosis not present

## 2023-06-05 DIAGNOSIS — Z7984 Long term (current) use of oral hypoglycemic drugs: Secondary | ICD-10-CM

## 2023-06-05 DIAGNOSIS — I4891 Unspecified atrial fibrillation: Principal | ICD-10-CM | POA: Diagnosis present

## 2023-06-05 DIAGNOSIS — I071 Rheumatic tricuspid insufficiency: Secondary | ICD-10-CM | POA: Diagnosis present

## 2023-06-05 DIAGNOSIS — I110 Hypertensive heart disease with heart failure: Secondary | ICD-10-CM | POA: Diagnosis present

## 2023-06-05 DIAGNOSIS — R040 Epistaxis: Secondary | ICD-10-CM

## 2023-06-05 DIAGNOSIS — I480 Paroxysmal atrial fibrillation: Principal | ICD-10-CM

## 2023-06-05 DIAGNOSIS — I5042 Chronic combined systolic (congestive) and diastolic (congestive) heart failure: Secondary | ICD-10-CM | POA: Diagnosis present

## 2023-06-05 DIAGNOSIS — M1990 Unspecified osteoarthritis, unspecified site: Secondary | ICD-10-CM | POA: Diagnosis present

## 2023-06-05 DIAGNOSIS — I2510 Atherosclerotic heart disease of native coronary artery without angina pectoris: Secondary | ICD-10-CM | POA: Diagnosis not present

## 2023-06-05 DIAGNOSIS — T884XXA Failed or difficult intubation, initial encounter: Secondary | ICD-10-CM

## 2023-06-05 HISTORY — DX: Failed or difficult intubation, initial encounter: T88.4XXA

## 2023-06-05 HISTORY — PX: TRANSESOPHAGEAL ECHOCARDIOGRAM (CATH LAB): EP1270

## 2023-06-05 HISTORY — PX: LEFT ATRIAL APPENDAGE OCCLUSION: EP1229

## 2023-06-05 HISTORY — PX: ATRIAL FIBRILLATION ABLATION: EP1191

## 2023-06-05 LAB — SURGICAL PCR SCREEN
MRSA, PCR: NEGATIVE
Staphylococcus aureus: NEGATIVE

## 2023-06-05 LAB — TYPE AND SCREEN
ABO/RH(D): O POS
Antibody Screen: NEGATIVE

## 2023-06-05 LAB — GLUCOSE, CAPILLARY
Glucose-Capillary: 110 mg/dL — ABNORMAL HIGH (ref 70–99)
Glucose-Capillary: 132 mg/dL — ABNORMAL HIGH (ref 70–99)
Glucose-Capillary: 190 mg/dL — ABNORMAL HIGH (ref 70–99)
Glucose-Capillary: 225 mg/dL — ABNORMAL HIGH (ref 70–99)

## 2023-06-05 LAB — ECHO TEE

## 2023-06-05 MED ORDER — CARVEDILOL 12.5 MG PO TABS
12.5000 mg | ORAL_TABLET | Freq: Two times a day (BID) | ORAL | Status: DC
  Filled 2023-06-05: qty 1

## 2023-06-05 MED ORDER — SUGAMMADEX SODIUM 200 MG/2ML IV SOLN
INTRAVENOUS | Status: DC | PRN
  Administered 2023-06-05: 200 mg via INTRAVENOUS

## 2023-06-05 MED ORDER — CEFAZOLIN SODIUM-DEXTROSE 2-4 GM/100ML-% IV SOLN
2.0000 g | INTRAVENOUS | Status: AC
  Administered 2023-06-05: 2 g via INTRAVENOUS
  Filled 2023-06-05: qty 100

## 2023-06-05 MED ORDER — COLCHICINE 0.6 MG PO TABS
0.6000 mg | ORAL_TABLET | Freq: Two times a day (BID) | ORAL | Status: DC
  Administered 2023-06-05 – 2023-06-06 (×2): 0.6 mg via ORAL
  Filled 2023-06-05 (×2): qty 1

## 2023-06-05 MED ORDER — PANTOPRAZOLE SODIUM 40 MG PO TBEC
40.0000 mg | DELAYED_RELEASE_TABLET | Freq: Every day | ORAL | Status: DC
  Administered 2023-06-05 – 2023-06-06 (×2): 40 mg via ORAL
  Filled 2023-06-05 (×2): qty 1

## 2023-06-05 MED ORDER — INSULIN ASPART 100 UNIT/ML IJ SOLN: 0.0000 [IU] | INTRAMUSCULAR | Status: DC | PRN

## 2023-06-05 MED ORDER — HEPARIN (PORCINE) IN NACL 1000-0.9 UT/500ML-% IV SOLN
INTRAVENOUS | Status: DC | PRN
  Administered 2023-06-05 (×3): 500 mL

## 2023-06-05 MED ORDER — SODIUM CHLORIDE 0.9 % IV SOLN: 250.0000 mL | INTRAVENOUS | Status: DC | PRN

## 2023-06-05 MED ORDER — LIDOCAINE HCL (CARDIAC) PF 100 MG/5ML IV SOSY
PREFILLED_SYRINGE | INTRAVENOUS | Status: DC | PRN
  Administered 2023-06-05: 40 mg via INTRATRACHEAL

## 2023-06-05 MED ORDER — ACETAMINOPHEN 325 MG PO TABS
650.0000 mg | ORAL_TABLET | ORAL | Status: DC | PRN
  Administered 2023-06-05: 650 mg via ORAL
  Filled 2023-06-05: qty 2

## 2023-06-05 MED ORDER — HEPARIN (PORCINE) IN NACL 2000-0.9 UNIT/L-% IV SOLN
INTRAVENOUS | Status: DC | PRN
  Administered 2023-06-05: 1000 mL

## 2023-06-05 MED ORDER — PHENYLEPHRINE HCL-NACL 20-0.9 MG/250ML-% IV SOLN
INTRAVENOUS | Status: DC | PRN
Start: 2023-06-05 — End: 2023-06-05
  Administered 2023-06-05: 30 ug/min via INTRAVENOUS

## 2023-06-05 MED ORDER — ROCURONIUM BROMIDE 10 MG/ML (PF) SYRINGE
PREFILLED_SYRINGE | INTRAVENOUS | Status: DC | PRN
  Administered 2023-06-05: 70 mg via INTRAVENOUS
  Administered 2023-06-05: 30 mg via INTRAVENOUS

## 2023-06-05 MED ORDER — CHLORHEXIDINE GLUCONATE 0.12 % MT SOLN
OROMUCOSAL | Status: AC
  Administered 2023-06-05: 15 mL
  Filled 2023-06-05: qty 15

## 2023-06-05 MED ORDER — SODIUM CHLORIDE 0.9% FLUSH
3.0000 mL | Freq: Two times a day (BID) | INTRAVENOUS | Status: DC
  Administered 2023-06-05 – 2023-06-06 (×2): 3 mL via INTRAVENOUS

## 2023-06-05 MED ORDER — APIXABAN 5 MG PO TABS
5.0000 mg | ORAL_TABLET | Freq: Two times a day (BID) | ORAL | Status: DC
  Administered 2023-06-05 – 2023-06-06 (×2): 5 mg via ORAL
  Filled 2023-06-05 (×2): qty 1

## 2023-06-05 MED ORDER — ATROPINE SULFATE 0.4 MG/ML IV SOLN
INTRAVENOUS | Status: DC | PRN
  Administered 2023-06-05: .5 mg via INTRAVENOUS

## 2023-06-05 MED ORDER — SODIUM CHLORIDE 0.9% FLUSH: 3.0000 mL | INTRAVENOUS | Status: DC | PRN

## 2023-06-05 MED ORDER — LACTATED RINGERS IV SOLN: INTRAVENOUS | Status: DC

## 2023-06-05 MED ORDER — SODIUM CHLORIDE 0.9 % IV SOLN
INTRAVENOUS | Status: DC
Start: 2023-06-05 — End: 2023-06-05

## 2023-06-05 MED ORDER — ONDANSETRON HCL 4 MG/2ML IJ SOLN: 4.0000 mg | Freq: Four times a day (QID) | INTRAMUSCULAR | Status: DC | PRN

## 2023-06-05 MED ORDER — SACUBITRIL-VALSARTAN 49-51 MG PO TABS
1.0000 | ORAL_TABLET | Freq: Two times a day (BID) | ORAL | Status: DC
  Filled 2023-06-05 (×2): qty 1

## 2023-06-05 MED ORDER — SODIUM CHLORIDE 0.9 % IV SOLN: INTRAVENOUS | Status: DC

## 2023-06-05 MED ORDER — HEPARIN SODIUM (PORCINE) 1000 UNIT/ML IJ SOLN
INTRAMUSCULAR | Status: DC | PRN
  Administered 2023-06-05: 16000 [IU] via INTRAVENOUS

## 2023-06-05 MED ORDER — DEXAMETHASONE SODIUM PHOSPHATE 10 MG/ML IJ SOLN
INTRAMUSCULAR | Status: DC | PRN
  Administered 2023-06-05: 4 mg via INTRAVENOUS

## 2023-06-05 MED ORDER — IOHEXOL 350 MG/ML SOLN
INTRAVENOUS | Status: DC | PRN
  Administered 2023-06-05: 8 mL

## 2023-06-05 MED ORDER — PROTAMINE SULFATE 10 MG/ML IV SOLN
INTRAVENOUS | Status: DC | PRN
  Administered 2023-06-05: 35 mg via INTRAVENOUS

## 2023-06-05 MED ORDER — ONDANSETRON HCL 4 MG/2ML IJ SOLN
INTRAMUSCULAR | Status: DC | PRN
  Administered 2023-06-05: 4 mg via INTRAVENOUS

## 2023-06-05 MED ORDER — SPIRONOLACTONE 25 MG PO TABS
25.0000 mg | ORAL_TABLET | Freq: Every day | ORAL | Status: DC
  Administered 2023-06-06: 25 mg via ORAL
  Filled 2023-06-05: qty 1

## 2023-06-05 MED ORDER — PROPOFOL 10 MG/ML IV BOLUS
INTRAVENOUS | Status: DC | PRN
  Administered 2023-06-05: 140 mg via INTRAVENOUS

## 2023-06-05 MED ORDER — INSULIN ASPART 100 UNIT/ML IJ SOLN
0.0000 [IU] | Freq: Three times a day (TID) | INTRAMUSCULAR | Status: DC
  Administered 2023-06-06: 2 [IU] via SUBCUTANEOUS
  Administered 2023-06-06: 3 [IU] via SUBCUTANEOUS

## 2023-06-05 MED ORDER — DAPAGLIFLOZIN PROPANEDIOL 5 MG PO TABS
5.0000 mg | ORAL_TABLET | Freq: Every day | ORAL | Status: DC
  Administered 2023-06-06: 5 mg via ORAL
  Filled 2023-06-05: qty 1

## 2023-06-05 NOTE — Discharge Summary (Incomplete)
 Electrophysiology Discharge Summary   Patient ID: Darlene Wade,  MRN: [id number], DOB/AGE: 1956-01-14 68 y.o.  Admit date: 06/05/2023 Discharge date: 06/06/2023  Primary Care Physician: Claudene Crystal, PA-C  Primary Cardiologist: None  Electrophysiologist: Boyce Byes, MD   Primary Discharge Diagnosis:  Paroxysmal Atrial Fibrillation Poor candidacy for long term anticoagulation due to frequent nosebleeds CHA2DS2Vasc is 6  Secondary Discharge Diagnosis:  HTN DM Chronic CHF (systolic)  Procedures This Admission:  Transeptal Puncture Intra-procedural TEE which showed no LAA thrombus Left atrial appendage occlusive device placement on 06/05/23 by Dr. Marven Slimmer.  4. EPS/Ablation CONCLUSIONS:  1. Successful PVI 2. Successful ablation/isolation of the posterior wall 3. Successful implantation of a WATCHMAN left atrial appendage occlusive device    4. TEE demonstrating no LAA thrombus 5. Intracardiac echo reveals trivial pericardial effusion, normal LA architecture, successful Watchman implant 6. Colchicine  0.6mg  PO BID x 5 days 7. Protonix  40mg  PO daily x 45 days 8. No early apparent complications.    Brief HPI: Darlene Wade is a 68 y.o. female with a history of Paroxysmal Atrial Fibrillation who was referred to Electrophysiology in the outpatient setting for consideration for [facility] Course:  The patient was admitted and underwent left atrial appendage occlusive device placement as above.  The patient was monitored throughout her stay and has done very well with no concerns. Groin sites are stable without evidence of hematoma or bleeding. Wound care and restrictions were reviewed with the patient.   The patient has been scheduled for post procedure follow up with EP APP in approximately 6 weeks. They will restart Eliquis  this evening and continue for 45 days then stop. At that time she will transition to Plavix 75mg  daily to complete 6 months of therapy. They  will require dental SBE for 6 month post op and should refrain from dental work or cleanings for the first 45 days post implant. SBE to be RXd at follow up.   A repeat CT scan will be performed in approximately 60 days to ensure proper seal of the device.   OK to return to work 06/13/23   Physical Exam: Vitals:   06/06/23 0100 06/06/23 0200 06/06/23 0833 06/06/23 0930  BP: 90/65 (!) 101/56 (!) 100/59 (!) 92/54  Pulse: 73 67 78 74  Resp: 18 16 16 19   Temp:   98.1 F (36.7 C)   TempSrc:   Oral   SpO2: 99% 98% 100% 100%  Weight:      Height:        GEN: Well nourished, well developed in no acute distress NECK: No JVD; No carotid bruits CARDIAC: Regular rate and rhythm, no murmurs, rubs, gallops RESPIRATORY:  CTA b/l, wheezing or rhonchi  ABDOMEN: Soft, non-tender, non-distended EXTREMITIES:  No edema; No deformity. Groin sites are both Stable     Discharge Medications:  Allergies as of 06/06/2023       Reactions   Jardiance  [empagliflozin ]    Yeast infections, tolerates farxiga  though   Naproxen Nausea Only        Medication List     TAKE these medications    Accu-Chek Guide test strip Generic drug: glucose blood Test 1-2 times daily   Accu-Chek Guide w/Device Kit Use to check with test strips   Accu-Chek Softclix Lancets lancets Test 1-2 times daily   acetaminophen  650 MG CR tablet Commonly known as: TYLENOL  Take 650 mg by mouth as needed for pain.   apixaban  5 MG Tabs tablet  Commonly known as: ELIQUIS  Take 1 tablet (5 mg total) by mouth 2 (two) times daily.   atorvastatin  40 MG tablet Commonly known as: LIPITOR  Take 1 tablet by mouth once daily   carvedilol  12.5 MG tablet Commonly known as: COREG  Take 1 tablet (12.5 mg total) by mouth 2 (two) times daily with a meal.   colchicine  0.6 MG tablet Take 1 tablet (0.6 mg total) by mouth 2 (two) times daily for 5 days.   dapagliflozin  propanediol 5 MG Tabs tablet Commonly known as: Farxiga  Take 1  tablet (5 mg total) by mouth daily before breakfast.   Entresto  49-51 MG Generic drug: sacubitril -valsartan  Take 1 tablet by mouth twice daily   gabapentin  300 MG capsule Commonly known as: NEURONTIN  Take 300 mg by mouth daily as needed (pain).   Janumet  50-500 MG tablet Generic drug: sitaGLIPtin -metformin  Take 1 tablet by mouth daily with breakfast.   pantoprazole  40 MG tablet Commonly known as: Protonix  Take 1 tablet (40 mg total) by mouth daily.   spironolactone  25 MG tablet Commonly known as: ALDACTONE  Take 1 tablet by mouth once daily   traMADol  50 MG tablet Commonly known as: ULTRAM  Take 50 mg by mouth every 6 (six) hours as needed for moderate pain (pain score 4-6).   Vitamin D  50 MCG (2000 UT) Caps Take 1 capsule (2,000 Units total) by mouth daily.        Disposition:  Home with usual follow up as in AVS  Duration of Discharge Encounter:  APP Time: 15 minutes  Signed, Debbie Fails, PA-C  06/06/2023 10:03 AM

## 2023-06-05 NOTE — Discharge Instructions (Addendum)
 Ablation/WATCHMANT Procedure, Care After  Procedure MD: Dr. Abram Hoguet Clinical Coordinator: Larkin Plumb, RN  This sheet gives you information about how to care for yourself after your procedure. Your health care provider may also give you more specific instructions. If you have problems or questions, contact your health care provider.  What can I expect after the procedure? After the procedure, it is common to have: Bruising around your puncture site. Tenderness around your puncture site. Tiredness (fatigue).  Medication instructions It is very important to continue to take your blood thinner as directed by your doctor after the Watchman procedure. Call your procedure doctor's office with question or concerns. If you are on Coumadin (warfarin), you will have your INR checked the week after your procedure, with a goal INR of 2.0 - 3.0. Please follow your medication instructions on your discharge summary. Only take the medications listed on your discharge paperwork.  Follow up You will be seen in 6 weeks after your procedure You will have a repeat CT scan or Echocardiogram approximately 8 weeks after your procedure mark to check your device You will follow up the MD/APP who performed your procedure 6 months after your procedure The Watchman Clinical Coordinator will check in with you from time to time, including 1 and 2 years after your procedure.  NO DENTAL CLEANINGS FOR 45 days. After that, you will require antibiotics for dental procedures the first 6 months.   Follow these instructions at home: Puncture site care  Follow instructions from your health care provider about how to take care of your puncture site. Make sure you: If present, leave stitches (sutures), skin glue, or adhesive strips in place.  If a large square bandage is present, this may be removed 24 hours after surgery.  Check your puncture site every day for signs of infection. Check for: Redness, swelling, or  pain. Fluid or blood. If your puncture site starts to bleed, lie down on your back, apply firm pressure to the area, and contact your health care provider. Warmth. Pus or a bad smell. Driving Do not drive yourself home if you received sedation Do not drive for at least 4 days after your procedure or however long your health care provider recommends. (Do not resume driving if you have previously been instructed not to drive for other health reasons.) Do not spend greater than 1 hour at a time in a car for the first 3 days. Stop and take a break with a 5 minute walk at least every hour.  Do not drive or use heavy machinery while taking prescription pain medicine.  Activity Avoid activities that take a lot of effort, including exercise, for at least 7 days after your procedure. For the first 3 days, avoid sitting for longer than one hour at a time.  Avoid alcoholic beverages, signing paperwork, or participating in legal proceedings for 24 hours after receiving sedation Do not lift anything that is heavier than 10 lb (4.5 kg) for one week.  No sexual activity for 1 week.  Return to your normal activities as told by your health care provider. Ask your health care provider what activities are safe for you. General instructions Take over-the-counter and prescription medicines only as told by your health care provider. Do not use any products that contain nicotine or tobacco, such as cigarettes and e-cigarettes. If you need help quitting, ask your health care provider. You may shower after 24 hours, but Do not take baths, swim, or use a hot tub for  1 week.  Do not drink alcohol for 24 hours after your procedure. Keep all follow-up visits as told by your health care provider. This is important. Dental Work: You will require antibiotics prior to any dental work, including cleanings, for 6 months after your Watchman implantation to help protect you from infection. After 6 months, antibiotics are no  longer required. Contact a health care provider if: You have redness, mild swelling, or pain around your puncture site. You have soreness in your throat or at your puncture site that does not improve after several days You have fluid or blood coming from your puncture site that stops after applying firm pressure to the area. Your puncture site feels warm to the touch. You have pus or a bad smell coming from your puncture site. You have a fever. You have chest pain or discomfort that spreads to your neck, jaw, or arm. You are sweating a lot. You feel nauseous. You have a fast or irregular heartbeat. You have shortness of breath. You are dizzy or light-headed and feel the need to lie down. You have pain or numbness in the arm or leg closest to your puncture site. Get help right away if: Your puncture site suddenly swells. Your puncture site is bleeding and the bleeding does not stop after applying firm pressure to the area. These symptoms may represent a serious problem that is an emergency. Do not wait to see if the symptoms will go away. Get medical help right away. Call your local emergency services (911 in the [place] Do not drive yourself to the hospital. Summary After the procedure, it is normal to have bruising and tenderness at the puncture site in your groin, neck, or forearm. Check your puncture site every day for signs of infection. Get help right away if your puncture site is bleeding and the bleeding does not stop after applying firm pressure to the area. This is a medical emergency.  This information is not intended to replace advice given to you by your health care provider. Make sure you discuss any questions you have with your health care provider.

## 2023-06-05 NOTE — Progress Notes (Signed)
 Patient arrived at the unit from cath lab,vitals checked,CCMD notified,bilateral groin level 0,dorsalis pedis pulse doppler,pt oriented to the unit,call bell in reach

## 2023-06-05 NOTE — Transfer of Care (Signed)
 Immediate Anesthesia Transfer of Care Note  Patient: Darlene Wade  Procedure(s) Performed: ATRIAL FIBRILLATION ABLATION LEFT ATRIAL APPENDAGE OCCLUSION TRANSESOPHAGEAL ECHOCARDIOGRAM  Patient Location: Cath Lab  Anesthesia Type:General  Level of Consciousness: awake, alert , and oriented  Airway & Oxygen  Therapy: Patient Spontanous Breathing and Patient connected to nasal cannula oxygen   Post-op Assessment: Report given to RN and Post -op Vital signs reviewed and stable  Post vital signs: Reviewed and stable  Last Vitals:  Vitals Value Taken Time  BP 99/70 06/05/23 1635  Temp    Pulse 83 06/05/23 1636  Resp 14 06/05/23 1640  SpO2 100 % 06/05/23 1636  Vitals shown include unfiled device data.  Last Pain:  Vitals:   06/05/23 1127  TempSrc:   PainSc: 0-No pain         Complications:  Encounter Notable Events  Notable Event Outcome Phase Comment  Difficult to intubate - unexpected  Intraprocedure Filed from anesthesia note documentation.

## 2023-06-05 NOTE — Anesthesia Preprocedure Evaluation (Signed)
 Anesthesia Evaluation  Patient identified by MRN, date of birth, ID band Patient awake    Reviewed: Allergy & Precautions, NPO status , Patient's Chart, lab work & pertinent test results, reviewed documented beta blocker date and time   History of Anesthesia Complications (+) PONV and history of anesthetic complications  Airway Mallampati: II  TM Distance: >3 FB     Dental no notable dental hx.    Pulmonary shortness of breath, asthma , neg sleep apnea, neg COPD, former smoker   breath sounds clear to auscultation       Cardiovascular hypertension, + CAD and +CHF  (-) Past MI, (-) Cardiac Stents, (-) CABG, (-) Orthopnea, (-) PND and (-) DOE + dysrhythmias Atrial Fibrillation  Rhythm:Regular Rate:Normal     Neuro/Psych neg Seizures    GI/Hepatic ,neg GERD  ,,(+) neg Cirrhosis        Endo/Other  diabetes, Type 2, Oral Hypoglycemic Agents    Renal/GU      Musculoskeletal  (+) Arthritis ,    Abdominal   Peds  Hematology   Anesthesia Other Findings   Reproductive/Obstetrics                             Anesthesia Physical Anesthesia Plan  ASA: 2  Anesthesia Plan: General   Post-op Pain Management:    Induction:   PONV Risk Score and Plan: 2 and Ondansetron , Dexamethasone  and Propofol  infusion  Airway Management Planned: Oral ETT  Additional Equipment: TEE  Intra-op Plan:   Post-operative Plan: Extubation in OR  Informed Consent: I have reviewed the patients History and Physical, chart, labs and discussed the procedure including the risks, benefits and alternatives for the proposed anesthesia with the patient or authorized representative who has indicated his/her understanding and acceptance.     Dental advisory given  Plan Discussed with: CRNA  Anesthesia Plan Comments:        Anesthesia Quick Evaluation

## 2023-06-05 NOTE — Interval H&P Note (Signed)
 History and Physical Interval Note:  06/05/2023 11:21 AM  Darlene Wade  has presented today for surgery, with the diagnosis of afib.  The various methods of treatment have been discussed with the patient and family. After consideration of risks, benefits and other options for treatment, the patient has consented to  Procedure(s): ATRIAL FIBRILLATION ABLATION (N/A) LEFT ATRIAL APPENDAGE OCCLUSION (N/A) TRANSESOPHAGEAL ECHOCARDIOGRAM (N/A) as a surgical intervention.  The patient's history has been reviewed, patient examined, no change in status, stable for surgery.  I have reviewed the patient's chart and labs.  Questions were answered to the patient's satisfaction.     [name] T [name]

## 2023-06-05 NOTE — Anesthesia Procedure Notes (Addendum)
 Procedure Name: Intubation Date/Time: 06/05/2023 2:29 PM  Performed by: Emmitt Harp, CRNAPre-anesthesia Checklist: Patient identified, Emergency Drugs available, Suction available and Patient being monitored Patient Re-evaluated:Patient Re-evaluated prior to induction Oxygen  Delivery Method: Circle System Utilized Preoxygenation: Pre-oxygenation with 100% oxygen  Induction Type: IV induction Ventilation: Mask ventilation without difficulty Laryngoscope Size: Mac, Miller, 3, 4 and Glidescope Grade View: Grade III Tube type: Oral Tube size: 7.5 mm Number of attempts: 1 Airway Equipment and Method: Stylet and Oral airway Placement Confirmation: ETT inserted through vocal cords under direct vision, positive ETCO2 and breath sounds checked- equal and bilateral Secured at: 22 cm Tube secured with: Tape Dental Injury: Teeth and Oropharynx as per pre-operative assessment  Difficulty Due To: Difficulty was unanticipated, Difficult Airway- due to anterior larynx and Difficult Airway- due to reduced neck mobility Future Recommendations: Recommend- induction with short-acting agent, and alternative techniques readily available Comments: Initial attempt by [name], CRNA with Mac 3, no adequate view. Second attempt by [place], MD with miller 2, g3v, attempted ETT passage but immediately recognized as esophageal and removed. Glidescope utilized with G2av mostly due to poor neck extension. Easy mask ventilation throughout attempts. Recommend glidescope intubation in future.

## 2023-06-06 ENCOUNTER — Encounter: Payer: Self-pay | Admitting: Physician Assistant

## 2023-06-06 ENCOUNTER — Encounter (HOSPITAL_COMMUNITY): Payer: Self-pay | Admitting: Cardiology

## 2023-06-06 ENCOUNTER — Other Ambulatory Visit (HOSPITAL_COMMUNITY): Payer: Self-pay

## 2023-06-06 LAB — GLUCOSE, CAPILLARY
Glucose-Capillary: 139 mg/dL — ABNORMAL HIGH (ref 70–99)
Glucose-Capillary: 179 mg/dL — ABNORMAL HIGH (ref 70–99)
Glucose-Capillary: 137 mg/dL — ABNORMAL HIGH (ref 70–99)

## 2023-06-06 LAB — POCT ACTIVATED CLOTTING TIME: Activated Clotting Time: 452 s

## 2023-06-06 MED ORDER — PANTOPRAZOLE SODIUM 40 MG PO TBEC
40.0000 mg | DELAYED_RELEASE_TABLET | Freq: Every day | ORAL | 0 refills | Status: DC
  Filled 2023-06-06: qty 45, 45d supply, fill #0

## 2023-06-06 MED ORDER — COLCHICINE 0.6 MG PO TABS
0.6000 mg | ORAL_TABLET | Freq: Two times a day (BID) | ORAL | 0 refills | Status: DC
  Filled 2023-06-06: qty 10, 5d supply, fill #0

## 2023-06-06 NOTE — Plan of Care (Signed)
 Problem: Education: Goal: Knowledge of cardiac device and self-care will improve 06/06/2023 1141 by Bula Carney, RN Outcome: Adequate for Discharge 06/06/2023 1116 by Bula Carney, RN Outcome: Progressing Goal: Ability to safely manage health related needs after discharge will improve 06/06/2023 1141 by Bula Carney, RN Outcome: Adequate for Discharge 06/06/2023 1116 by Bula Carney, RN Outcome: Progressing Goal: Individualized Educational Video(s) 06/06/2023 1141 by Bula Carney, RN Outcome: Adequate for Discharge 06/06/2023 1116 by Bula Carney, RN Outcome: Progressing   Problem: Cardiac: Goal: Ability to achieve and maintain adequate cardiopulmonary perfusion will improve 06/06/2023 1141 by Bula Carney, RN Outcome: Adequate for Discharge 06/06/2023 1116 by Bula Carney, RN Outcome: Progressing   Problem: Education: Goal: Knowledge of General Education information will improve Description: Including pain rating scale, medication(s)/side effects and non-pharmacologic comfort measures 06/06/2023 1141 by Bula Carney, RN Outcome: Adequate for Discharge 06/06/2023 1116 by Bula Carney, RN Outcome: Progressing   Problem: Health Behavior/Discharge Planning: Goal: Ability to manage health-related needs will improve 06/06/2023 1141 by Bula Carney, RN Outcome: Adequate for Discharge 06/06/2023 1116 by Bula Carney, RN Outcome: Progressing   Problem: Clinical Measurements: Goal: Ability to maintain clinical measurements within normal limits will improve 06/06/2023 1141 by Bula Carney, RN Outcome: Adequate for Discharge 06/06/2023 1116 by Bula Carney, RN Outcome: Progressing Goal: Will remain free from infection 06/06/2023 1141 by Bula Carney, RN Outcome: Adequate for Discharge 06/06/2023 1116 by Bula Carney, RN Outcome: Progressing Goal: Diagnostic test results will improve 06/06/2023 1141 by Bula Carney, RN Outcome: Adequate for Discharge 06/06/2023 1116 by Bula Carney, RN Outcome: Progressing Goal: Respiratory complications will improve 06/06/2023 1141 by Bula Carney, RN Outcome: Adequate for Discharge 06/06/2023 1116 by Bula Carney, RN Outcome: Progressing Goal: Cardiovascular complication will be avoided 06/06/2023 1141 by Bula Carney, RN Outcome: Adequate for Discharge 06/06/2023 1116 by Bula Carney, RN Outcome: Progressing   Problem: Activity: Goal: Risk for activity intolerance will decrease 06/06/2023 1141 by Bula Carney, RN Outcome: Adequate for Discharge 06/06/2023 1116 by Bula Carney, RN Outcome: Progressing   Problem: Nutrition: Goal: Adequate nutrition will be maintained 06/06/2023 1141 by Bula Carney, RN Outcome: Adequate for Discharge 06/06/2023 1116 by Bula Carney, RN Outcome: Progressing   Problem: Coping: Goal: Level of anxiety will decrease 06/06/2023 1141 by Bula Carney, RN Outcome: Adequate for Discharge 06/06/2023 1116 by Bula Carney, RN Outcome: Progressing   Problem: Elimination: Goal: Will not experience complications related to bowel motility 06/06/2023 1141 by Bula Carney, RN Outcome: Adequate for Discharge 06/06/2023 1116 by Bula Carney, RN Outcome: Progressing Goal: Will not experience complications related to urinary retention 06/06/2023 1141 by Bula Carney, RN Outcome: Adequate for Discharge 06/06/2023 1116 by Bula Carney, RN Outcome: Progressing   Problem: Pain Managment: Goal: General experience of comfort will improve and/or be controlled 06/06/2023 1141 by Bula Carney, RN Outcome: Adequate for Discharge 06/06/2023 1116 by Bula Carney, RN Outcome: Progressing   Problem: Safety: Goal: Ability to remain free from injury will improve 06/06/2023 1141 by Bula Carney, RN Outcome: Adequate for Discharge 06/06/2023 1116 by Bula Carney, RN Outcome: Progressing   Problem: Skin Integrity: Goal: Risk for impaired skin integrity will decrease 06/06/2023 1141 by Bula Carney, RN Outcome: Adequate for Discharge 06/06/2023 1116 by Bula Carney, RN Outcome: Progressing   Problem: Education: Goal: Ability to describe self-care measures that may prevent or decrease complications (Diabetes Survival Skills Education) will improve 06/06/2023 1141 by Bula Carney, RN Outcome: Adequate for Discharge 06/06/2023 1116 by Bula Carney, RN Outcome: Progressing Goal: Individualized Educational Video(s)  06/06/2023 1141 by Bula Carney, RN Outcome: Adequate for Discharge 06/06/2023 1116 by Bula Carney, RN Outcome: Progressing   Problem: Coping: Goal: Ability to adjust to condition or change in health will improve 06/06/2023 1141 by Bula Carney, RN Outcome: Adequate for Discharge 06/06/2023 1116 by Bula Carney, RN Outcome: Progressing   Problem: Fluid Volume: Goal: Ability to maintain a balanced intake and output will improve 06/06/2023 1141 by Bula Carney, RN Outcome: Adequate for Discharge 06/06/2023 1116 by Bula Carney, RN Outcome: Progressing   Problem: Health Behavior/Discharge Planning: Goal: Ability to identify and utilize available resources and services will improve 06/06/2023 1141 by Bula Carney, RN Outcome: Adequate for Discharge 06/06/2023 1116 by Bula Carney, RN Outcome: Progressing Goal: Ability to manage health-related needs will improve 06/06/2023 1141 by Bula Carney, RN Outcome: Adequate for Discharge 06/06/2023 1116 by Bula Carney, RN Outcome: Progressing   Problem: Metabolic: Goal: Ability to maintain appropriate glucose levels will improve 06/06/2023 1141 by Bula Carney, RN Outcome: Adequate for Discharge 06/06/2023 1116 by Bula Carney, RN Outcome: Progressing   Problem: Nutritional: Goal: Maintenance of adequate  nutrition will improve 06/06/2023 1141 by Bula Carney, RN Outcome: Adequate for Discharge 06/06/2023 1116 by Bula Carney, RN Outcome: Progressing Goal: Progress toward achieving an optimal weight will improve 06/06/2023 1141 by Bula Carney, RN Outcome: Adequate for Discharge 06/06/2023 1116 by Bula Carney, RN Outcome: Progressing   Problem: Skin Integrity: Goal: Risk for impaired skin integrity will decrease 06/06/2023 1141 by Bula Carney, RN Outcome: Adequate for Discharge 06/06/2023 1116 by Bula Carney, RN Outcome: Progressing   Problem: Tissue Perfusion: Goal: Adequacy of tissue perfusion will improve 06/06/2023 1141 by Bula Carney, RN Outcome: Adequate for Discharge 06/06/2023 1116 by Bula Carney, RN Outcome: Progressing

## 2023-06-06 NOTE — Anesthesia Postprocedure Evaluation (Signed)
 Anesthesia Post Note  Patient: Darlene Wade  Procedure(s) Performed: ATRIAL FIBRILLATION ABLATION LEFT ATRIAL APPENDAGE OCCLUSION TRANSESOPHAGEAL ECHOCARDIOGRAM     Patient location during evaluation: PACU Anesthesia Type: General Level of consciousness: awake and alert Pain management: pain level controlled Vital Signs Assessment: post-procedure vital signs reviewed and stable Respiratory status: spontaneous breathing, nonlabored ventilation, respiratory function stable and patient connected to nasal cannula oxygen  Cardiovascular status: blood pressure returned to baseline and stable Postop Assessment: no apparent nausea or vomiting Anesthetic complications: yes   Encounter Notable Events  Notable Event Outcome Phase Comment  Difficult to intubate - unexpected  Intraprocedure Filed from anesthesia note documentation.               Leslye Rast

## 2023-06-06 NOTE — TOC CM/SW Note (Signed)
 Transition of Care [facility]) - Inpatient Brief Assessment   Patient Details  Name: Darlene Wade MRN: [id number] Date of Birth: 08-Jun-1955  Transition of [facility] CM/SW Contact:    Cosimo Diones, RN Phone Number: 06/06/2023, 12:13 PM   Clinical Narrative: Patient presented for [facility]; PTA patient was independent from home with spouse. Patient has PCP and gets to appointments without any issues. No home needs identified at this time. Patient has transportation home.       Transition of Care Asessment: Insurance and Status: Insurance coverage has been reviewed Patient has primary care physician: Yes Home environment has been reviewed: reviewed Prior level of function:: independent Prior/Current Home Services: No current home services Social Drivers of Health Review: SDOH reviewed no interventions necessary Readmission risk has been reviewed: Yes Transition of care needs: no transition of care needs at this time

## 2023-06-06 NOTE — Plan of Care (Signed)
  Problem: Education: Goal: Knowledge of cardiac device and self-care will improve Outcome: Progressing Goal: Ability to safely manage health related needs after discharge will improve Outcome: Progressing Goal: Individualized Educational Video(s) Outcome: Progressing   Problem: Cardiac: Goal: Ability to achieve and maintain adequate cardiopulmonary perfusion will improve Outcome: Progressing   Problem: Education: Goal: Knowledge of General Education information will improve Description: Including pain rating scale, medication(s)/side effects and non-pharmacologic comfort measures Outcome: Progressing   Problem: Health Behavior/Discharge Planning: Goal: Ability to manage health-related needs will improve Outcome: Progressing   Problem: Clinical Measurements: Goal: Ability to maintain clinical measurements within normal limits will improve Outcome: Progressing Goal: Will remain free from infection Outcome: Progressing Goal: Diagnostic test results will improve Outcome: Progressing Goal: Respiratory complications will improve Outcome: Progressing Goal: Cardiovascular complication will be avoided Outcome: Progressing   Problem: Activity: Goal: Risk for activity intolerance will decrease Outcome: Progressing   Problem: Nutrition: Goal: Adequate nutrition will be maintained Outcome: Progressing   Problem: Coping: Goal: Level of anxiety will decrease Outcome: Progressing   Problem: Elimination: Goal: Will not experience complications related to bowel motility Outcome: Progressing Goal: Will not experience complications related to urinary retention Outcome: Progressing   Problem: Pain Managment: Goal: General experience of comfort will improve and/or be controlled Outcome: Progressing   Problem: Safety: Goal: Ability to remain free from injury will improve Outcome: Progressing   Problem: Skin Integrity: Goal: Risk for impaired skin integrity will  decrease Outcome: Progressing   Problem: Education: Goal: Ability to describe self-care measures that may prevent or decrease complications (Diabetes Survival Skills Education) will improve Outcome: Progressing Goal: Individualized Educational Video(s) Outcome: Progressing   Problem: Coping: Goal: Ability to adjust to condition or change in health will improve Outcome: Progressing   Problem: Fluid Volume: Goal: Ability to maintain a balanced intake and output will improve Outcome: Progressing   Problem: Health Behavior/Discharge Planning: Goal: Ability to identify and utilize available resources and services will improve Outcome: Progressing Goal: Ability to manage health-related needs will improve Outcome: Progressing   Problem: Metabolic: Goal: Ability to maintain appropriate glucose levels will improve Outcome: Progressing   Problem: Nutritional: Goal: Maintenance of adequate nutrition will improve Outcome: Progressing Goal: Progress toward achieving an optimal weight will improve Outcome: Progressing   Problem: Skin Integrity: Goal: Risk for impaired skin integrity will decrease Outcome: Progressing   Problem: Tissue Perfusion: Goal: Adequacy of tissue perfusion will improve Outcome: Progressing

## 2023-06-06 NOTE — Progress Notes (Signed)
 Mobility Specialist Progress Note:   06/06/23 1050  Mobility  Activity Ambulated with assistance to bathroom;Ambulated with assistance in room;Ambulated with assistance in hallway  Level of Assistance Contact guard assist, steadying assist  Assistive Device Four wheel walker  Distance Ambulated (ft) 150 ft  Activity Response Tolerated well  Mobility Referral Yes  Mobility visit 1 Mobility  Mobility Specialist Start Time (ACUTE ONLY) 1041  Mobility Specialist Stop Time (ACUTE ONLY) 1051  Mobility Specialist Time Calculation (min) (ACUTE ONLY) 10 min   Pt eager for mobility session. Ambulated in hallway with 4WW. VSS throughout, no complaints. Returned pt to room, assisted to bathroom. Void successful. Returned to bed, eager for d/c. Left with all needs met, call bell in reach.   Gwenda Heiner Mobility Specialist Please contact via Special educational needs teacher or  Rehab office at [phone]

## 2023-06-06 NOTE — Progress Notes (Signed)
 Pt's bp has been in low 90's, latest 94/65 with MAP of 76. Talked to [facility], ok to hold coreg  and Entresto  for now , said pt can resume tonight. Pt was dizzy when first got up, walked with Pt without any distress. No dizziness at the moment. Getting ready for d/c home.

## 2023-06-08 ENCOUNTER — Telehealth: Payer: Self-pay | Admitting: Home Health

## 2023-06-08 NOTE — Telephone Encounter (Signed)
 Patient called after-hours line reporting she had Watchman device placed 06/05/2023, she has noted some mild diarrhea, no significant nausea, vomiting, fever, chest pain, shortness of breath, syncope.  She has noted her blood pressure has been fluctuating with SBP around 90s.  She has felt some lightheadedness today, checked her blood pressure it was 77/46 with heart rate 69.  She felt her lightheadedness is mild and is able to continue maintaining function.  Advised patient to stop home medication carvedilol  12.5 mg twice daily, Farxiga  5 mg daily, Entresto  49-51 mg twice daily, and spironolactone  25 mg daily.  She had taken carvedilol  this morning but not the rest of GDMT.  Advised patient to drink 1 L fluid as she can tolerate.  Advised patient to recheck blood pressure by noon.  If her blood pressure remains low and her symptoms are not improving or worsening, she should head to the ER for further evaluation.  Advised patient to call Dr. Alroy Aspen office tomorrow to arrange a follow-up in 1 week to evaluate her blood pressure as well as medication adjustment if indicated.  She is confident with above plan and feels comfortable to follow above instruction at home.  All questions answered to satisfaction.  Will forward this message to Dr. Marven Slimmer

## 2023-06-08 NOTE — Telephone Encounter (Signed)
 Called patient to advise stop taking colchicine  per Dr Marven Slimmer request. She agreed.

## 2023-06-09 ENCOUNTER — Ambulatory Visit: Attending: Cardiovascular Disease | Admitting: Cardiovascular Disease

## 2023-06-09 ENCOUNTER — Encounter: Payer: Self-pay | Admitting: Cardiovascular Disease

## 2023-06-09 VITALS — BP 92/52 | HR 79 | Ht 62.0 in | Wt 205.2 lb

## 2023-06-09 DIAGNOSIS — I5042 Chronic combined systolic (congestive) and diastolic (congestive) heart failure: Secondary | ICD-10-CM

## 2023-06-09 DIAGNOSIS — I480 Paroxysmal atrial fibrillation: Secondary | ICD-10-CM

## 2023-06-09 LAB — GLUCOSE, CAPILLARY: Glucose-Capillary: 295 mg/dL — ABNORMAL HIGH (ref 70–99)

## 2023-06-09 NOTE — Patient Instructions (Signed)
 Medication Instructions:  Your physician recommends that you continue on your current medications as directed. Please refer to the Current Medication list given to you today. *If you need a refill on your cardiac medications before your next appointment, please call your pharmacy*  Follow-Up: At [facility], you and your health needs are our priority.  As part of our continuing mission to provide you with exceptional heart care, our providers are all part of one team.  This team includes your primary Cardiologist (physician) and Advanced Practice Providers or APPs (Physician Assistants and Nurse Practitioners) who all work together to provide you with the care you need, when you need it.  Your next appointment:   Keep follow up appointment already scheduled   Provider:   Mertha Abrahams, PA-C

## 2023-06-09 NOTE — Progress Notes (Signed)
 Cardiology Office Note:    Date:  06/09/2023   ID:  Darlene Wade, DOB 07-10-1955, MRN [id number]  PCP:  Claudene Crystal, PA-C  Cardiologist:  New to [facility]  Electrophysiologist:  Boyce Byes, MD   Referring MD: Claudene Crystal, PA-C   Problem List 1. Diabetes 2. HTN 3. Hyperlipidemia  4.  Osteoarthritis  5.  Atrial fib  No chief complaint on file.    Dec. 2, 2019:    Darlene Wade is a 68 y.o. female with a hx of hypertension, hyperlipidemia, Diabetes  Her primary ordered on echo  She was found to have a very mildly reduced left ventricular systolic function ejection fraction of 40 to 45%.  She has grade 1 diastolic dysfunction.  Was exercising regularly in the gym but her knees started causing her lots of pain  Now she is walking 30 minutes a day  Drives a school bus   She does not eat  Lots of salt Has lost 40 lbs over the past several months - has been paying attention to her diet  Is also on Invokana    Nov. 23, 2021:  Darlene Wade is seen today for follow-up visit.  She has chronic combined systolic and diastolic congestive heart failure. Last echocardiogram was June, 2020.  She has a left ventricular systolic function with an EF of 40 to 45%.  At that time she had normal diastolic parameters. Has not gained any weight .   Trying to watch her diet .  Wt today is 217 lbs ( down 3 lbs since Dec. 2019. She has had knee surgery since then.   Thinking about getting the other knee done   Lipids are managed by her primary md, labs were reviewed ,  Look great  December 26, 2020: Darlene Wade seen today for follow-up of her chronic combined systolic and diastolic congestive heart failure.  Last echocardiogram was performed in June, 2020.  Her ejection fraction is 40 to 45%. Had n Right TKA in June, 2022  Is back exercising ,  no CP or dyspnea Is still rehabbing her knee.   She is in the donut hole currently.  We would like to start her on Entresto  but this may be cost  prohibitive for now.  The plan is for her to start Entresto  49-51 p.o. twice daily starting on January 1.  She will stop the losartan  at that time.  We will check a basic metabolic profile approximately 2 to 3 weeks after she starts the Entresto . Anticipate that she will be able to take the Entresto  until about October or November of next year at which time she will change back to losartan  100 mg a day.  I will see her in 6 months.  Anticipate getting an echocardiogram if she has been able to tolerate the Entresto .  Hopefully we will see some further improvement of her left ventricular function.  Aug. 4, 2023 Darlene Wade is seen for follow up of her CHF Is on entresto  49-51 bid Watches her salt Walks ( 11,000 steps a day ) Is on Farxiga  Jardiance  caused yeast infection  Will add spironolactone      Feb. 7 , 2025 Darlene Wade is seen for follow up of her CHF  Was found to have atrial fib by Charles Connor, NP since I last saw her  She is in NSR to day   May 16, 2023 Darlene Wade is seen for follow up of her CHF Paroxysmal Atrial fib  She was having lots of  severe nosebleeds on Eliquis  5 mg a day.  We discussed with her primary medical doctor.  We have reduced her Eliquis  to 2.5 mg a day to see if this will help.  We have placed a referral to electrophysiology to consider Watchman procedure.  After discussing the whole issue she would like to try going back up to Eliquis  5 mg a day to see if she can tolerate that and at the same time reduce her risk of stroke.  I agree with having her try it again. She will she will try some needed nasal saline spray.  She will return to see ENT soon for follow-up.  If the bleeding becomes very severe then I suppose we can reduce the dose to 2.5 mg a day which should provide her with some protection while we are arranging for her to see electrophysiology to consider Watchman procedure.  CHADS2VASC score is 65 ( female, age > 59, CHF, HTN, DM, CAD )  June 09, 2023: Are seen  today for follow-up of her congestive heart failure and paroxysmal atrial fibrillation.  Had a Watchman procedure and afib ablation ( PVI) 4 days ago Has had some hypotension since that time  Held entrestro, spironolactone , coreg , Farxiga    Of note, her LVEF was 40% in Sept. 2024 TEE during the procedure shows normal LVEF of 65-70%.     BP is still low today . Will not be able to restart any of these yet   She thinks she is volume depleted  Is feeling better    Past Medical History:  Diagnosis Date   Asthma 07/20/2018   one puff per day   Chronic combined systolic and diastolic heart failure (HCC) 03/03/2018   Echo 07/2018: EF 40-45, diff HK worse in Inf base, normal RVSF   Diabetes mellitus without complication (HCC) 2012   Diabetic eye exam (HCC)    [employer]   Dyspnea    Elbow fracture, right 2007   Former smoker    20 pack year history, quit 2010   Hyperlipidemia    Hypertension    Insomnia    Lung nodule    Chest CT 07/2018:  RLL nodule resolved.  3 mm subpleural LUL nodule.  Repeat in 1 year if high risk.    Obesity    PONV (postoperative nausea and vomiting) 2014   1 time   Wears glasses    reading    Past Surgical History:  Procedure Laterality Date   ABDOMINAL HYSTERECTOMY  1986   ATRIAL FIBRILLATION ABLATION N/A 06/05/2023   Procedure: ATRIAL FIBRILLATION ABLATION;  Surgeon: Boyce Byes, MD;  Location: [facility];  Service: Cardiovascular;  Laterality: N/A;   BREAST CYST ASPIRATION  2015   BREAST EXCISIONAL BIOPSY Left    BREAST LUMPECTOMY WITH NEEDLE LOCALIZATION Left 10/07/2012   Procedure: BREAST LUMPECTOMY WITH NEEDLE LOCALIZATION;  Surgeon: Kari Otto. Eli Grizzle, MD;  Location: [facility];  Service: General;  Laterality: Left;   BREAST SURGERY     COLONOSCOPY  01/2016   01/2016 with Dr. Dominic Friendly, tubular adenoma polpy; 2003 with Dr. Tova Fresh   ELBOW ARTHROPLASTY  2006   rt-fx   FOOT ARTHROTOMY  12/2010   foot fusion, right    FOOT MASS EXCISION  04/2012   left-fusion   HYSTERECTOMY ABDOMINAL WITH SALPINGECTOMY Bilateral 1985   JOINT REPLACEMENT  08/10/2018   Total knee replacement   LEFT ATRIAL APPENDAGE OCCLUSION N/A 06/05/2023   Procedure: LEFT ATRIAL APPENDAGE OCCLUSION;  Surgeon: Boyce Byes, MD;  Location: [facility] INVASIVE CV LAB;  Service: Cardiovascular;  Laterality: N/A;   PARTIAL HYSTERECTOMY  age 67   uterine fibroids, still has ovaries   TONSILLECTOMY     TOTAL KNEE ARTHROPLASTY Left 08/10/2018   Procedure: Left Knee Arthroplasty;  Surgeon: Wendolyn Hamburger, MD;  Location: [facility];  Service: Orthopedics;  Laterality: Left;   TRANSESOPHAGEAL ECHOCARDIOGRAM (CATH LAB) N/A 06/05/2023   Procedure: TRANSESOPHAGEAL ECHOCARDIOGRAM;  Surgeon: Boyce Byes, MD;  Location: [facility] INVASIVE CV LAB;  Service: Cardiovascular;  Laterality: N/A;    Current Medications: Current Meds  Medication Sig   Accu-Chek Softclix Lancets lancets Test 1-2 times daily   apixaban  (ELIQUIS ) 5 MG TABS tablet Take 1 tablet (5 mg total) by mouth 2 (two) times daily.   atorvastatin  (LIPITOR ) 40 MG tablet Take 1 tablet by mouth once daily   Blood Glucose Monitoring Suppl (ACCU-CHEK GUIDE) w/Device KIT Use to check with test strips   colchicine  0.6 MG tablet Take 1 tablet (0.6 mg total) by mouth 2 (two) times daily for 5 days.   gabapentin  (NEURONTIN ) 300 MG capsule Take 300 mg by mouth daily as needed (pain).   glucose blood (ACCU-CHEK GUIDE) test strip Test 1-2 times daily   pantoprazole  (PROTONIX ) 40 MG tablet Take 1 tablet (40 mg total) by mouth daily.   sitaGLIPtin -metformin  (JANUMET ) 50-500 MG tablet Take 1 tablet by mouth daily with breakfast.     Allergies:   Jardiance  [empagliflozin ] and Naproxen   Social History   Socioeconomic History   Marital status: Married    Spouse name: Not on file   Number of children: 1   Years of education: Not on file   Highest education level: GED or equivalent  Occupational History    Occupation: School bus driver  Tobacco Use   Smoking status: Former    Current packs/day: 0.00    Average packs/day: 1 pack/day for 20.0 years (20.0 ttl pk-yrs)    Types: Cigarettes    Start date: 04/11/1989    Quit date: 04/11/2009    Years since quitting: 14.1   Smokeless tobacco: Never  Vaping Use   Vaping status: Never Used  Substance and Sexual Activity   Alcohol use: Not Currently    Alcohol/week: 1.0 standard drink of alcohol    Comment: rare   Drug use: No   Sexual activity: Yes    Birth control/protection: Other-see comments    Comment: Hysterectomy  Other Topics Concern   Not on file  Social History Narrative   Separated from husband (2013). Living with husband.  Walking for exercise 2-3 x per week.  Retired, Herbalist in the past.  Has 2 grandchildren, teenagers.   Driving school bus.   [id number]   Social Drivers of Health   Financial Resource Strain: Low Risk  (02/04/2023)   Overall Financial Resource Strain (CARDIA)    Difficulty of Paying Living Expenses: Not hard at all  Food Insecurity: No Food Insecurity (06/05/2023)   Hunger Vital Sign    Worried About Running Out of Food in the Last Year: Never true    Ran Out of Food in the Last Year: Never true  Transportation Needs: No Transportation Needs (06/05/2023)   PRAPARE - Administrator, Civil Service (Medical): No    Lack of Transportation (Non-Medical): No  Physical Activity: Unknown (05/13/2023)   Exercise Vital Sign    Days of Exercise per Week: Patient declined    Minutes of Exercise per  Session: Patient unable to answer  Stress: No Stress Concern Present (02/04/2023)   [employer] of Occupational Health - Occupational Stress Questionnaire    Feeling of Stress : Not at all  Social Connections: Unknown (06/05/2023)   Social Connection and Isolation Panel [NHANES]    Frequency of Communication with Friends and Family: More than three times a week    Frequency of Social Gatherings with  Friends and Family: Twice a week    Attends Religious Services: Patient declined    Database administrator or Organizations: Yes    Attends Engineer, structural: Patient unable to answer    Marital Status: Married     Family History: The patient's family history includes Diabetes in her maternal aunt and mother; Heart disease in her maternal grandfather; Hypertension in her mother. There is no history of Cancer, Breast cancer, Colon cancer, Stomach cancer, or Esophageal cancer.  ROS:   Please see the history of present illness.     All other systems reviewed and are negative.  EKGs/Labs/Other Studies Reviewed:    The following studies were reviewed today:    Recent Labs: 09/24/2022: Magnesium 1.6; TSH 1.480 12/18/2022: ALT 7 05/13/2023: Hemoglobin 10.8; Platelets 230 05/26/2023: BUN 16; Creatinine, Ser 1.06; Potassium 4.5; Sodium 140  Recent Lipid Panel    Component Value Date/Time   CHOL 164 12/18/2022 1020   TRIG 90 12/18/2022 1020   HDL 52 12/18/2022 1020   CHOLHDL 3.2 12/18/2022 1020   CHOLHDL 2.0 05/22/2016 1014   VLDL 10 05/22/2016 1014   LDLCALC 95 12/18/2022 1020    Physical Exam:     Physical Exam: Blood pressure (!) 92/52, pulse 79, height 5\' 2"  (1.575 m), weight 205 lb 3.2 oz (93.1 kg), SpO2 99%.       GEN:  Well nourished, well developed in no acute distress HEENT: Normal NECK: No JVD; No carotid bruits LYMPHATICS: No lymphadenopathy CARDIAC: RRR , no murmurs, rubs, gallops RESPIRATORY:  Clear to auscultation without rales, wheezing or rhonchi  ABDOMEN: Soft, non-tender, non-distended MUSCULOSKELETAL:  No edema; No deformity  SKIN: Warm and dry NEUROLOGIC:  Alert and oriented x 3     EKG:           ASSESSMENT:    1. Paroxysmal atrial fibrillation (HCC)   2. Chronic combined systolic and diastolic heart failure (HCC)         PLAN:       1.  Mild chronic systolic  congestive heart failure:   Her most recent TEE showed normal  LV systolic function  She has stopped her CHF meds due to hypotension She will follow up with an APP in 3 months  Will not restart her CHF meds yet     2.  Hyperlipidemia: has minimal , non obstructive CAD .   3.  Obesity:      4.  Paroxysmal atrial fibrillation:   CHADS2VASC score is 52 ( female, age > 37, CHF, HTN, DM, CAD ) Is s/p Afib ablation and watchman procedure   Continue eliquis  for now (eliquis  duration to be determined by Dr. Marven Slimmer)     Medication Adjustments/Labs and Tests Ordered: Current medicines are reviewed at length with the patient today.  Concerns regarding medicines are outlined above.  No orders of the defined types were placed in this encounter.   No orders of the defined types were placed in this encounter.    Patient Instructions  Medication Instructions:  Your physician recommends that you continue  on your current medications as directed. Please refer to the Current Medication list given to you today. *If you need a refill on your cardiac medications before your next appointment, please call your pharmacy*  Follow-Up: At [facility], you and your health needs are our priority.  As part of our continuing mission to provide you with exceptional heart care, our providers are all part of one team.  This team includes your primary Cardiologist (physician) and Advanced Practice Providers or APPs (Physician Assistants and Nurse Practitioners) who all work together to provide you with the care you need, when you need it.  Your next appointment:   Keep follow up appointment already scheduled   Provider:   Mertha Abrahams, PA-C     Signed, Ahmad Alert, MD  06/09/2023 2:40 PM    [facility]

## 2023-06-12 ENCOUNTER — Telehealth: Payer: Self-pay

## 2023-06-12 DIAGNOSIS — I480 Paroxysmal atrial fibrillation: Secondary | ICD-10-CM

## 2023-06-12 DIAGNOSIS — Z95818 Presence of other cardiac implants and grafts: Secondary | ICD-10-CM

## 2023-06-12 NOTE — Telephone Encounter (Signed)
  [facility]   Watchman Team  Contacted the patient regarding discharge from [facility] on 06/06/2023  The patient understands to follow up with Mertha Abrahams on 07/30/2023  The patient understands discharge instructions? Yes  The patient understands medications and regimen? Yes   The patient reports groin site looks healthy with no S/S of infection or bleeding  The patient understands to call with any questions or concerns prior to scheduled visit.    Ms. Darlene Wade saw Dr. Alroy Aspen for evaluation of lightheadedness and low BP on 4/28. She is still holding spironolactone , carvedilol , Farxiga , and Entresto . She is checking her BP every 2 hours and the highest it has been was 111/60. The lowest was 70/50. She is feeling much better than she did when she saw Dr. Alroy Aspen. Instructed her to check her BP twice daily (and when she does not feel well) and to call Dr. Letta Raw office in 1 week with BP readings for medication recommendations. She will call prior to that time if BP is consistently over 120/80. She would also like to know if Dr. Alroy Aspen chose another physician for her to establish care when she calls.

## 2023-06-12 NOTE — Addendum Note (Signed)
 Addended by: Marieann Zipp A on: 06/12/2023 09:30 AM   Modules accepted: Orders

## 2023-06-17 ENCOUNTER — Encounter: Payer: Self-pay | Admitting: Cardiovascular Disease

## 2023-06-28 DIAGNOSIS — H52223 Regular astigmatism, bilateral: Secondary | ICD-10-CM | POA: Diagnosis not present

## 2023-06-28 DIAGNOSIS — E119 Type 2 diabetes mellitus without complications: Secondary | ICD-10-CM | POA: Diagnosis not present

## 2023-06-28 LAB — HM DIABETES EYE EXAM

## 2023-07-02 ENCOUNTER — Ambulatory Visit (INDEPENDENT_AMBULATORY_CARE_PROVIDER_SITE_OTHER): Admitting: Otolaryngology

## 2023-07-04 ENCOUNTER — Telehealth: Payer: Self-pay | Admitting: Medical

## 2023-07-04 NOTE — Telephone Encounter (Signed)
 Called to follow up on pt & she states she hasn't been taking the Farxiga  due to after her procedure her Cardiologist had her hold this, I asked & her BS have been good she says.  She has appt her cardiologist 07/30/23 & so [facility]

## 2023-07-08 ENCOUNTER — Ambulatory Visit: Payer: Self-pay | Admitting: Medical

## 2023-07-08 NOTE — Progress Notes (Signed)
 Abstract eye report

## 2023-07-28 NOTE — Progress Notes (Unsigned)
 Cardiology Office Note:  .   Date:  07/28/2023  ID:  Darlene Wade, DOB 1955/05/20, MRN [id number] PCP: Darlene Crystal, PA-C  [facility] Providers Cardiologist:  None Electrophysiologist:  Darlene Byes, MD {  History of Present Illness: .   Darlene Wade is a 68 y.o. female w/PMHx of  HTN, DM, HLD, OA Chronic CHF (HFmrEF) AFib  Referred to Dr. Marven Wade for evaluation of Afib management strategies. Saw him 05/21/23 Has had recurrent nosebleeds on OAC, seeing an ENT physician who has treated multiple bleeding blood vessels in her right nare  Felt to be a candidate for both LAAO as well as Afib ablation  Watchman implant AND Afib ablation on 06/05/23 Continue Eliquis  5mg  by mouth twice daily for 45 days after implant. After 45 days, stop Eliquis  and start Plavix 75mg  by mouth once daily to complete 6 months of post implant medical therapy   She saw Dr. Alroy Wade 06/09/23, reported low BPs and held entrestro, spironolactone , coreg , Farxiga .  At this visit noted that he LVEF via intra-procedure TEE was normalized to 65-70% BP was still low at this visit suspect volume depleted No plans to resume HF drugs to f/u with cards APP in 3 mo   Today's visit is scheduled as he post watchman visit ROS:   She feels really well No CP, palpitations or cardiac awareness No SOB No near syncope or syncope No procedural concerns She looks forward to being off Eliquis   Arrhythmia/AAD hx AFib found Aug 2024 AFib ablation 06/05/23 Watchman 06/05/23  No AAD to date  Studies Reviewed: Darlene Wade    EKG not done today  06/05/23 EPS/Ablation and Watchman implant Transeptal Puncture Intra-procedural TEE which showed no LAA thrombus Left atrial appendage occlusive device placement on 06/05/23 by Dr. Marven Wade.  4. EPS/Ablation CONCLUSIONS:  1. Successful PVI 2. Successful ablation/isolation of the posterior wall 3. Successful implantation of a WATCHMAN left atrial appendage occlusive device     4. TEE demonstrating no LAA thrombus 5. Intracardiac echo reveals trivial pericardial effusion, normal LA architecture, successful Watchman implant 6. Colchicine  0.6mg  PO BID x 5 days 7. Protonix  40mg  PO daily x 45 days 8. No early apparent complications.   06/05/23: TEE 1. Wind sock appendage with no thrombus. Well placed 31 mm Watchman FLX  device with no large shoulder, negative tugg test, no leak by color flow  and average compression 16%.   2. Left ventricular ejection fraction, by estimation, is 65 to 70%. The  left ventricle has normal function.   3. Right ventricular systolic function is normal. The right ventricular  size is normal.   4. No left atrial/left atrial appendage thrombus was detected.   5. The mitral valve is normal in structure. Trivial mitral valve  regurgitation.   6. The aortic valve is tricuspid. Aortic valve regurgitation is not  visualized. No aortic stenosis is present.   7. 3D performed of the LAA and demonstrates Extensive 3D imaging  performed to measure appendage and size Watchman device.   8. 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    November 11, 2022 echo EF 40% RV normal No MR  Risk Assessment/Calculations:    Physical Exam:   VS:  There were no vitals taken for this visit.   Wt Readings from Last 3 Encounters:  06/09/23 205 lb 3.2 oz (93.1 kg)  06/05/23 (P) 207 lb (93.9 kg)  05/21/23 208 lb (94.3 kg)    GEN: Well nourished, well developed  in no acute distress NECK: No JVD; No carotid bruits CARDIAC: RRR, no murmurs, rubs, gallops RESPIRATORY:  CTA b/l without rales, wheezing or rhonchi  ABDOMEN: Soft, non-tender, non-distended EXTREMITIES: No edema; No deformity   ASSESSMENT AND PLAN: .    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 Alroy Wade >> will have her see E. Valentina Gasman, NP in 4-6 weeks to finalize her meds, will stay off for now She will discuss new cardiologist with Darlene Wade at their visit  Secondary hypercoagulable state 2/2 AFib   Dispo: as above, sooner if needed  Signed, Darlene Fails, PA-C

## 2023-07-30 ENCOUNTER — Ambulatory Visit: Attending: Cardiology | Admitting: Physician Assistant

## 2023-07-30 ENCOUNTER — Other Ambulatory Visit: Payer: Self-pay | Admitting: Physician Assistant

## 2023-07-30 ENCOUNTER — Encounter: Payer: Self-pay | Admitting: Physician Assistant

## 2023-07-30 VITALS — BP 116/84 | HR 92 | Ht 62.0 in | Wt 203.0 lb

## 2023-07-30 DIAGNOSIS — Z95818 Presence of other cardiac implants and grafts: Secondary | ICD-10-CM

## 2023-07-30 DIAGNOSIS — I5022 Chronic systolic (congestive) heart failure: Secondary | ICD-10-CM | POA: Diagnosis not present

## 2023-07-30 DIAGNOSIS — D6869 Other thrombophilia: Secondary | ICD-10-CM | POA: Diagnosis not present

## 2023-07-30 DIAGNOSIS — I480 Paroxysmal atrial fibrillation: Secondary | ICD-10-CM

## 2023-07-30 MED ORDER — AMOXICILLIN 500 MG PO TABS
2000.0000 mg | ORAL_TABLET | Freq: Once | ORAL | 0 refills | Status: AC
Start: 2023-07-30 — End: 2023-07-30

## 2023-07-30 MED ORDER — CLOPIDOGREL BISULFATE 75 MG PO TABS: 75.0000 mg | ORAL_TABLET | Freq: Every day | ORAL | 3 refills | Status: DC

## 2023-07-30 NOTE — Patient Instructions (Addendum)
 Medication Instructions:   START TAKING: AMOXICILLIN  500 MG ( TAKE 2000 MG) 1 HOUR PRIOR TO DENTAL PROCEDURES  START TAKING ASPIRIN  81 MG ONCE A DAY    START TAKING: PLAVIX 75 MG ONCE A DAY    *If you need a refill on your cardiac medications before your next appointment, please call your pharmacy*   Lab Work:  PLEASE GO DOWN STAIRS  [facility]  FIRST FLOOR   ( GET OFF ELEVATORS WALK TOWARDS WAITING AREA LAB LOCATED BY PHARMACY):  BMET TODAY    If you have labs (blood work) drawn today and your tests are completely normal, you will receive your results only by: MyChart Message (if you have MyChart) OR A paper copy in the mail If you have any lab test that is abnormal or we need to change your treatment, we will call you to review the results.   Testing/Procedures: NONE ORDERED  TODAY    Follow-Up: At [facility], you and your health needs are our priority.  As part of our continuing mission to provide you with exceptional heart care, our providers are all part of one team.  This team includes your primary Cardiologist (physician) and Advanced Practice Providers or APPs (Physician Assistants and Nurse Practitioners) who all work together to provide you with the care you need, when you need it.  Your next appointment:    4-6 WEEKS WITH  ERNEST DICK  ONLY!!  Provider:  IN 5 MONTHS You may see Boyce Byes, MD or one of the following Advanced Practice Providers on your designated Care Team:   Mertha Abrahams, [place]  We recommend signing up for the patient portal called MyChart.  Sign up information is provided on this After Visit Summary.  MyChart is used to connect with patients for Virtual Visits (Telemedicine).  Patients are able to view lab/test results, encounter notes, upcoming appointments, etc.  Non-urgent messages can be sent to your provider as well.   To learn more about what you can do with MyChart, go to [email or web address].   Other Instructions

## 2023-07-30 NOTE — Telephone Encounter (Signed)
 Spoke to the pharmacist Sharmaine and notified her that it was okay to fill the clopidogrel and to discontinue the Elilquis from the patient's pharmacy profile.

## 2023-07-31 ENCOUNTER — Ambulatory Visit: Payer: Self-pay | Admitting: Physician Assistant

## 2023-07-31 LAB — BASIC METABOLIC PANEL WITH GFR
BUN/Creatinine Ratio: 19 (ref 12–28)
BUN: 15 mg/dL (ref 8–27)
CO2: 21 mmol/L (ref 20–29)
Calcium: 10.5 mg/dL — ABNORMAL HIGH (ref 8.7–10.3)
Chloride: 102 mmol/L (ref 96–106)
Creatinine, Ser: 0.77 mg/dL (ref 0.57–1.00)
Glucose: 97 mg/dL (ref 70–99)
Potassium: 4.4 mmol/L (ref 3.5–5.2)
Sodium: 140 mmol/L (ref 134–144)
eGFR: 83 mL/min/{1.73_m2} (ref >59)

## 2023-08-05 ENCOUNTER — Encounter: Payer: Self-pay | Admitting: Medical

## 2023-08-05 ENCOUNTER — Ambulatory Visit: Admitting: Medical

## 2023-08-05 VITALS — BP 120/70 | HR 88 | Ht 62.0 in | Wt 202.6 lb

## 2023-08-05 DIAGNOSIS — R040 Epistaxis: Secondary | ICD-10-CM

## 2023-08-05 DIAGNOSIS — E1169 Type 2 diabetes mellitus with other specified complication: Secondary | ICD-10-CM | POA: Diagnosis not present

## 2023-08-05 DIAGNOSIS — Z5181 Encounter for therapeutic drug level monitoring: Secondary | ICD-10-CM | POA: Insufficient documentation

## 2023-08-05 DIAGNOSIS — I700 Atherosclerosis of aorta: Secondary | ICD-10-CM

## 2023-08-05 DIAGNOSIS — Z79899 Other long term (current) drug therapy: Secondary | ICD-10-CM | POA: Diagnosis not present

## 2023-08-05 DIAGNOSIS — D6869 Other thrombophilia: Secondary | ICD-10-CM | POA: Diagnosis not present

## 2023-08-05 DIAGNOSIS — E785 Hyperlipidemia, unspecified: Secondary | ICD-10-CM

## 2023-08-05 DIAGNOSIS — E1165 Type 2 diabetes mellitus with hyperglycemia: Secondary | ICD-10-CM

## 2023-08-05 DIAGNOSIS — I10 Essential (primary) hypertension: Secondary | ICD-10-CM

## 2023-08-05 DIAGNOSIS — I480 Paroxysmal atrial fibrillation: Secondary | ICD-10-CM

## 2023-08-05 DIAGNOSIS — Z6837 Body mass index (BMI) 37.0-37.9, adult: Secondary | ICD-10-CM

## 2023-08-05 DIAGNOSIS — Z7185 Encounter for immunization safety counseling: Secondary | ICD-10-CM

## 2023-08-05 DIAGNOSIS — E118 Type 2 diabetes mellitus with unspecified complications: Secondary | ICD-10-CM

## 2023-08-05 DIAGNOSIS — I4891 Unspecified atrial fibrillation: Secondary | ICD-10-CM

## 2023-08-05 DIAGNOSIS — Z95818 Presence of other cardiac implants and grafts: Secondary | ICD-10-CM | POA: Insufficient documentation

## 2023-08-05 LAB — POCT GLYCOSYLATED HEMOGLOBIN (HGB A1C): Hemoglobin A1C: 6.3 % — AB (ref 4.0–5.6)

## 2023-08-05 NOTE — Progress Notes (Signed)
 Subjective:  Darlene Wade is a 68 y.o. female who presents for Chief Complaint  Patient presents with   Diabetes    Fasting diabetes med check, no new concerns.   Here for medication management  Last visit in April 2025 she was having nosebleeds.  We had coordinated with cardiology and decreased her Eliquis  to 2.5 mg twice daily at that time.  She remained on Eliquis  until last week when she saw cardiology and postop follow-up from having the Watchman device placed in April 2025.  She is no longer on Eliquis .  She has had no more nosebleeds since the decrease in Eliquis  in April 2025  She is currently being held off of her Entresto , spironolactone , carvedilol , and Farxiga  given some recent low blood pressures in the lightheadedness.  She has follow-up planned with cardiology in 1 month.  She is monitoring blood pressures at home.  Hyperlipidemia-compliant with atorvastatin  40 mg daily and aspirin  81 mg daily. Compliant with Plavix 75 mg daily  Diabetes-She is taking Janumet  50/500 mg 1 tablet daily, glucose readings 120-140 fasting.  No other aggravating or relieving factors.    No other c/o.  Past Medical History:  Diagnosis Date   Asthma 07/20/2018   one puff per day   Chronic combined systolic and diastolic heart failure (HCC) 03/03/2018   Echo 07/2018: EF 40-45, diff HK worse in Inf base, normal RVSF   Diabetes mellitus without complication (HCC) 2012   Diabetic eye exam (HCC)    [employer]   Dyspnea    Elbow fracture, right 2007   Former smoker    20 pack year history, quit 2010   Hyperlipidemia    Hypertension    Insomnia    Lung nodule    Chest CT 07/2018:  RLL nodule resolved.  3 mm subpleural LUL nodule.  Repeat in 1 year if high risk.    Obesity    PONV (postoperative nausea and vomiting) 2014   1 time   Wears glasses    reading   Current Outpatient Medications on File Prior to Visit  Medication Sig Dispense Refill   Accu-Chek Softclix Lancets lancets Test  1-2 times daily 100 each 2   acetaminophen  (TYLENOL ) 650 MG CR tablet Take 650 mg by mouth as needed for pain.     aspirin  EC 81 MG tablet Take 81 mg by mouth daily. Swallow whole.     atorvastatin  (LIPITOR ) 40 MG tablet Take 1 tablet by mouth once daily 90 tablet 1   Blood Glucose Monitoring Suppl (ACCU-CHEK GUIDE) w/Device KIT Use to check with test strips 1 kit 0   clopidogrel (PLAVIX) 75 MG tablet Take 1 tablet (75 mg total) by mouth daily. 90 tablet 3   gabapentin  (NEURONTIN ) 300 MG capsule Take 300 mg by mouth daily as needed (pain).     glucose blood (ACCU-CHEK GUIDE) test strip Test 1-2 times daily 100 each 2   sitaGLIPtin -metformin  (JANUMET ) 50-500 MG tablet Take 1 tablet by mouth daily with breakfast. 90 tablet 1   traMADol  (ULTRAM ) 50 MG tablet Take 50 mg by mouth every 6 (six) hours as needed for moderate pain (pain score 4-6).     carvedilol  (COREG ) 12.5 MG tablet Take 1 tablet (12.5 mg total) by mouth 2 (two) times daily with a meal. (Patient not taking: Reported on 08/05/2023) 180 tablet 3   Cholecalciferol (VITAMIN D ) 50 MCG (2000 UT) CAPS Take 1 capsule (2,000 Units total) by mouth daily. (Patient not taking: Reported on 08/05/2023)  90 capsule 3   dapagliflozin  propanediol (FARXIGA ) 5 MG TABS tablet Take 1 tablet (5 mg total) by mouth daily before breakfast. (Patient not taking: Reported on 08/05/2023) 90 tablet 3   pantoprazole  (PROTONIX ) 40 MG tablet Take 1 tablet (40 mg total) by mouth daily. 45 tablet 0   sacubitril -valsartan  (ENTRESTO ) 49-51 MG Take 1 tablet by mouth twice daily (Patient not taking: Reported on 08/05/2023) 180 tablet 2   spironolactone  (ALDACTONE ) 25 MG tablet Take 1 tablet by mouth once daily (Patient not taking: Reported on 08/05/2023) 90 tablet 3   No current facility-administered medications on file prior to visit.     The following portions of the patient's history were reviewed and updated as appropriate: allergies, current medications, past family  history, past medical history, past social history, past surgical history and problem list.  ROS Otherwise as in subjective above    Objective: BP 120/70   Pulse 88   Ht 5' 2 (1.575 m)   Wt 202 lb 9.6 oz (91.9 kg)   BMI 37.06 kg/m   BP Readings from Last 3 Encounters:  08/05/23 120/70  07/30/23 116/84  06/09/23 (!) 92/52   Wt Readings from Last 3 Encounters:  08/05/23 202 lb 9.6 oz (91.9 kg)  07/30/23 203 lb (92.1 kg)  06/09/23 205 lb 3.2 oz (93.1 kg)   General appearance: alert, no distress, well developed, well nourished Pulses: 2+ radial pulses, 2+ pedal pulses, normal cap refill Ext: no edema  Diabetic Foot Exam - Simple   Simple Foot Form Diabetic Foot exam was performed with the following findings: Yes 08/05/2023  8:30 AM  Visual Inspection See comments: Yes Sensation Testing Intact to touch and monofilament testing bilaterally: Yes Pulse Check Posterior Tibialis and Dorsalis pulse intact bilaterally: Yes Comments Surgical scars on dorsal feet bilat and medial great toe bilat from prior foot surgeries       Assessment: Encounter Diagnoses  Name Primary?   Diabetes mellitus with complication (HCC) Yes   Type 2 diabetes mellitus with hyperglycemia, without long-term current use of insulin  (HCC)    Medication management    BMI 37.0-37.9, adult    Aortic atherosclerosis (HCC)    Atrial fibrillation, unspecified type (HCC)    Epistaxis    Essential hypertension, benign    Hyperlipidemia associated with type 2 diabetes mellitus (HCC)    Hypercoagulable state due to paroxysmal atrial fibrillation (HCC)    Vaccine counseling    Paroxysmal atrial fibrillation (HCC)    Presence of Watchman left atrial appendage closure device      Plan: Diabetes type 2-continue Janumet  50/500 mg 1 tablet daily.  Continue glucose monitoring.  HgbA1C 6.3%  BMI 37-continue efforts to lose weight through healthy diet and exercise.  Obesity associated with diabetes-continue  efforts to lose weight through healthy diet and exercise  Fortunately her nosebleeds stopped after discontinuing Eliquis  or decrease in dose of Eliquis  back in April 2025  Hyperlipidemia, aortic atherosclerosis, coronary disease-continue aspirin  81 mg daily, atorvastatin  40 mg daily, Plavix 75 mg daily  Hypertension-she has had recent low blood pressures after she had the Watchman device put in April.  Cardiology has her holding her cardiac medications currently including spironolactone , Entresto , carvedilol  and Farxiga .  She will monitor blood pressures and weights.  She will let them know if she starts seeing numbers trend upwards and she will follow-up with cardiology in a month as planned  I will reach out to cardiology today to inquire about timing of vaccines and how  long she needs to be on antibiotic prophylaxis.  She is due for Tdap and shingles vaccine.  She has amoxicillin  at home for SBE antibiotic prophylaxis   She is status post placement of Watchman device in April 2025  Darlene Wade was seen today for diabetes.  Diagnoses and all orders for this visit:  Diabetes mellitus with complication (HCC)  Type 2 diabetes mellitus with hyperglycemia, without long-term current use of insulin  (HCC)  Medication management  BMI 37.0-37.9, adult  Aortic atherosclerosis (HCC)  Atrial fibrillation, unspecified type (HCC)  Epistaxis  Essential hypertension, benign  Hyperlipidemia associated with type 2 diabetes mellitus (HCC)  Hypercoagulable state due to paroxysmal atrial fibrillation (HCC)  Vaccine counseling  Paroxysmal atrial fibrillation (HCC)  Presence of Watchman left atrial appendage closure device    Follow up: pending call back

## 2023-08-05 NOTE — Addendum Note (Signed)
 Addended by: CLAUDENE COUNTRYMAN F on: 08/05/2023 11:18 AM   Modules accepted: Orders

## 2023-08-06 ENCOUNTER — Ambulatory Visit (HOSPITAL_COMMUNITY)
Admission: RE | Admit: 2023-08-06 | Discharge: 2023-08-06 | Disposition: A | Discharge status: Home / Self Care | Source: Ambulatory Visit | Attending: Medical | Admitting: Medical

## 2023-08-06 DIAGNOSIS — I700 Atherosclerosis of aorta: Secondary | ICD-10-CM | POA: Diagnosis not present

## 2023-08-06 DIAGNOSIS — Z95818 Presence of other cardiac implants and grafts: Secondary | ICD-10-CM | POA: Diagnosis not present

## 2023-08-06 DIAGNOSIS — I480 Paroxysmal atrial fibrillation: Secondary | ICD-10-CM | POA: Insufficient documentation

## 2023-08-06 MED ORDER — IOHEXOL 350 MG/ML SOLN
100.0000 mL | Freq: Once | INTRAVENOUS | Status: AC | PRN
  Administered 2023-08-06: 100 mL via INTRAVENOUS

## 2023-08-07 ENCOUNTER — Ambulatory Visit: Payer: Self-pay | Admitting: Cardiology

## 2023-08-18 NOTE — Progress Notes (Unsigned)
 Cardiology Office Note    Patient Name: Darlene Wade Date of Encounter: 08/18/2023  Primary Care Provider:  Bulah Alm RAMAN, PA-C Primary Cardiologist:  None Primary Electrophysiologist: OLE ONEIDA HOLTS, MD   Past Medical History    Past Medical History:  Diagnosis Date   Asthma 07/20/2018   one puff per day   Chronic combined systolic and diastolic heart failure (HCC) 03/03/2018   Echo 07/2018: EF 40-45, diff HK worse in Inf base, normal RVSF   Diabetes mellitus without complication (HCC) 2012   Diabetic eye exam (HCC)    [employer]   Dyspnea    Elbow fracture, right 2007   Former smoker    20 pack year history, quit 2010   Hyperlipidemia    Hypertension    Insomnia    Lung nodule    Chest CT 07/2018:  RLL nodule resolved.  3 mm subpleural LUL nodule.  Repeat in 1 year if high risk.    Obesity    PONV (postoperative nausea and vomiting) 2014   1 time   Wears glasses    reading    History of Present Illness  Darlene Wade is a 68 year old female with a PMH of with PMH of paroxysmal AF, HTN, HLD, chronic combined CHF, DM type II, asthma, osteoarthritis who presents today for 68-month follow-up.  Darlene Wade was last seen by Dr. Alveta on 06/09/2023.  She was previously diagnosed with atrial fibrillation and placed on Eliquis  5 mg twice daily.  She was seen by her PCP in April and was suffering lots of nosebleeds and had Eliquis  reduced to 2.5 mg.  She was referred to Dr. HOLTS for consideration of watchman and atrial fibrillation ablation.  She was also evaluated by ENT was seen in follow-up on 05/16/2023 Eliquis  increased to 5 mg twice daily.  She was evaluated by Dr. HOLTS on 05/21/2023 for evaluation of watchman and was found to be where the candidate.  She underwent watchman placement on 06/01/2023 along with AF ablation.  TEE was completed showing improved EF of 65 to 70%.  She was continued on Eliquis  5 mg twice daily x 45 days and then transition to Plavix  75 mg x 6  months.  She was placed on colchicine  x 5 days which was discontinued due to diarrhea and hypotension.  She had Entresto , Coreg , spironolactone , and Farxiga  held. She was seen by Dr. Alveta on 06/09/2023 and noted low BP at 92/52 with plan to continue to hold at that time.  She was seen for follow-up on 07/30/2023 and blood pressure was still low at  116/84.  She had Eliquis  discontinued and was started on Plavix  75 mg with no plan to restart medications at that time.  Darlene Wade presents today for 48-month follow-up review of blood pressures. She has a history of atrial fibrillation and heart failure, recently undergoing an atrial fibrillation ablation and a Watchman procedure. Post-procedure, she experienced low blood pressure, initially thought to be due to colchicine , which was subsequently discontinued. Despite this, her blood pressure remained low, leading to the discontinuation of Entresto , carvedilol , and spironolactone . Her heart function improved from 40-45% to 65-70% following the procedures. She has not experienced any awareness of atrial fibrillation since the procedures, and her Apple Watch indicates a low burden of atrial fibrillation at 2% weekly. Post-procedure, she experienced dizziness and dehydration, exacerbated by diarrhea, resulting in a fall when attempting to walk after being in bed post-surgery. The dizziness lasted for almost a  week but has since resolved. She is currently on Plavix  and aspirin  following the Watchman procedure. She has a history of diabetes and is restarting Farxiga  for blood sugar control. She is aware of potential side effects such as increased urination and risk of urinary tract infections. She is considering a shoulder injection for pain management but is unsure of the timing. Patient denies chest pain, palpitations, dyspnea, PND, orthopnea, nausea, vomiting, dizziness, syncope, edema, weight gain, or early satiety.  Discussed the use of AI scribe software for  clinical note transcription with the patient, who gave verbal consent to proceed.  History of Present Illness   Review of Systems  Please see the history of present illness.    All other systems reviewed and are otherwise negative except as noted above.  Physical Exam    Wt Readings from Last 3 Encounters:  08/05/23 202 lb 9.6 oz (91.9 kg)  07/30/23 203 lb (92.1 kg)  06/09/23 205 lb 3.2 oz (93.1 kg)   CD:Uyzmz were no vitals filed for this visit.,There is no height or weight on file to calculate BMI. GEN: Well nourished, well developed in no acute distress Neck: No JVD; No carotid bruits Pulmonary: Clear to auscultation without rales, wheezing or rhonchi  Cardiovascular: Normal rate. Regular rhythm. Normal S1. Normal S2.   Murmurs: There is no murmur.  ABDOMEN: Soft, non-tender, non-distended EXTREMITIES:  No edema; No deformity   EKG/LABS/ Recent Cardiac Studies   ECG personally reviewed by me today -none completed today  Risk Assessment/Calculations:    CHA2DS2-VASc Score = 5   This indicates a 7.2% annual risk of stroke. The patient's score is based upon: CHF History: 1 HTN History: 1 Diabetes History: 1 Stroke History: 0 Vascular Disease History: 0 Age Score: 1 Gender Score: 1         Lab Results  Component Value Date   WBC 6.9 05/13/2023   HGB 10.8 (L) 05/13/2023   HCT 33.5 (L) 05/13/2023   MCV 89 05/13/2023   PLT 230 05/13/2023   Lab Results  Component Value Date   CREATININE 0.77 07/30/2023   BUN 15 07/30/2023   NA 140 07/30/2023   K 4.4 07/30/2023   CL 102 07/30/2023   CO2 21 07/30/2023   Lab Results  Component Value Date   CHOL 164 12/18/2022   HDL 52 12/18/2022   LDLCALC 95 12/18/2022   TRIG 90 12/18/2022   CHOLHDL 3.2 12/18/2022    Lab Results  Component Value Date   HGBA1C 6.3 (A) 08/05/2023   Assessment & Plan   Assessment & Plan  1.  Atrial fibrillation: -AFib well-controlled post-ablation and Watchman, 2% weekly episodes, no  recent tachycardia or recurrence symptoms. - Monitor heart rhythm via Apple Watch. - Evaluate for AFib recurrence if dizziness or hypotension occur. - Implement lifestyle modifications: reduce caffeine, alcohol, manage stress. - Continue Plavix  75 mg and ASA 81 mg  2.  Chronic combined CHF: -Heart function improved to 65-70% post-AFib management, combined functional and diastolic CHF. - Restart Entresto  24/26 mg. - Restart Farxiga  5 mg, consider increase to 10 mg. - Check kidney function in two weeks. - Monitor blood pressure for two weeks, report readings. - Stay hydrated. - Educated on Farxiga  side effects: yeast infections, UTIs.  3.  Essential hypertension: - Patient's blood pressure today was stable at 118/68 - We will restart Farxiga  5 mg and Entresto  24/26 mg twice daily - She will continue to hold spironolactone  25 mg and carvedilol  12.5 mg twice  daily. - Patient will log BPs for 2 weeks with goal of 130/80 and should contact office for systolics less than 100 and diastolics less than 60.  4.  Hyperlipidemia: - Patient's last LDL cholesterol was 95 on 88/7975 - Continue Lipitor  40 mg and will recheck LFT and lipids in 6 months.  5.  DM type II: - Patient's last hemoglobin A1c was 6.3 on 08/01/2023 -She will restart Farxiga  5 mg and should continue lifestyle modifications for management of hyperglycemia. -Check BMET in 2 weeks  6. Preop Clearance: - Patient's RCRI score is 0.9% -The patient affirms she has been doing well without any new cardiac symptoms. They are able to achieve 7 METS without cardiac limitations. Therefore, based on ACC/AHA guidelines, the patient would be at acceptable risk for the planned procedure without further cardiovascular testing. The patient was advised that if she develops new symptoms prior to surgery to contact our office to arrange for a follow-up visit, and she verbalized understanding.   - Patient will need preop clearance note submitted for  guidance on holding aspirin  and Plavix  prior to possible shoulder injection in the future.  Disposition: Follow-up with Dr.Sullivan or APP in 6 months   Signed, Wyn Raddle, Jackee Shove, NP 08/18/2023, 1:22 PM [facility]

## 2023-08-20 ENCOUNTER — Other Ambulatory Visit: Payer: Self-pay

## 2023-08-20 ENCOUNTER — Encounter: Payer: Self-pay | Admitting: Nurse Practitioner

## 2023-08-20 ENCOUNTER — Ambulatory Visit: Attending: Cardiovascular Disease | Admitting: Nurse Practitioner

## 2023-08-20 VITALS — BP 118/68 | HR 94 | Ht 62.0 in | Wt 206.0 lb

## 2023-08-20 DIAGNOSIS — E1169 Type 2 diabetes mellitus with other specified complication: Secondary | ICD-10-CM

## 2023-08-20 DIAGNOSIS — I10 Essential (primary) hypertension: Secondary | ICD-10-CM

## 2023-08-20 DIAGNOSIS — Z01810 Encounter for preprocedural cardiovascular examination: Secondary | ICD-10-CM | POA: Diagnosis not present

## 2023-08-20 DIAGNOSIS — I480 Paroxysmal atrial fibrillation: Secondary | ICD-10-CM | POA: Diagnosis not present

## 2023-08-20 DIAGNOSIS — E1165 Type 2 diabetes mellitus with hyperglycemia: Secondary | ICD-10-CM

## 2023-08-20 DIAGNOSIS — I5022 Chronic systolic (congestive) heart failure: Secondary | ICD-10-CM

## 2023-08-20 DIAGNOSIS — E785 Hyperlipidemia, unspecified: Secondary | ICD-10-CM

## 2023-08-20 MED ORDER — DAPAGLIFLOZIN PROPANEDIOL 5 MG PO TABS: 5.0000 mg | ORAL_TABLET | Freq: Every day | ORAL | 0 refills | Status: DC

## 2023-08-20 MED ORDER — SACUBITRIL-VALSARTAN 24-26 MG PO TABS
1.0000 | ORAL_TABLET | Freq: Two times a day (BID) | ORAL | 1 refills | Status: DC
Start: 2023-08-20 — End: 2023-10-01

## 2023-08-20 NOTE — Patient Instructions (Addendum)
 Medication Instructions:  RESTART Farxiga  at 5mg  Take 1 tablet daily RESTART Entresto  at 24/26mg  Take 1 tablet twice a day  *If you need a refill on your cardiac medications before your next appointment, please call your pharmacy*  Lab Work: BMET in 2 weeks (Juky 23, 2025) If you have labs (blood work) drawn today and your tests are completely normal, you will receive your results only by: MyChart Message (if you have MyChart) OR A paper copy in the mail If you have any lab test that is abnormal or we need to change your treatment, we will call you to review the results.  Testing/Procedures: None ordered  Follow-Up: At [facility], you and your health needs are our priority.  As part of our continuing mission to provide you with exceptional heart care, our providers are all part of one team.  This team includes your primary Cardiologist (physician) and Advanced Practice Providers or APPs (Physician Assistants and Nurse Practitioners) who all work together to provide you with the care you need, when you need it.  Your next appointment:   6 month(s)  Provider:   Georganna Archer, MD    We recommend signing up for the patient portal called MyChart.  Sign up information is provided on this After Visit Summary.  MyChart is used to connect with patients for Virtual Visits (Telemedicine).  Patients are able to view lab/test results, encounter notes, upcoming appointments, etc.  Non-urgent messages can be sent to your provider as well.   To learn more about what you can do with MyChart, go to [email or web address].   Other Instructions Check your blood pressure daily for 2 weeks, then contact the office with your readings.  Your blood pressure goal is 130/80. If your top number is lower than 100 please contact the office. Contact the office either by phone or MyChart with your readings.  Make sure to check your blood pressure 2 hours after taking your medications.   AVOID these  things for 30 minutes before checking your blood pressure: No Drinking caffeine. No Drinking alcohol. No Eating. No Smoking. No Exercising.  Five minutes before checking your blood pressure: Pee. Sit in a dining chair. Avoid sitting in a soft couch or armchair. Be quiet. Do not talk.

## 2023-08-22 MED ORDER — METOPROLOL SUCCINATE ER 25 MG PO TB24: 25.0000 mg | ORAL_TABLET | Freq: Every day | ORAL | 3 refills | Status: AC

## 2023-08-22 NOTE — Telephone Encounter (Signed)
 Spoke with pt regarding her blood pressure. Pt stated she was cleaning this morning and started sweating which prompted her to take her blood pressure. Pt stated her blood pressure was 99/70 and heart rate was 123. Pt stated she did not feel lightheaded and is not having any shortness of breath or chest pain. Pt was taken off of carvedilol  and spironolactone  and is now taking Entresto  and Farxiga . Pt took her blood pressure while on the phone and it was 103/73 and heart rate was 113. Pt was asked if she has been hydrating adequately. Pt stated she probably does not drink enough fluids. Pt was told to hydrate. Pt was told that the information provided would be sent to Jackee Alberts, NP and his covering for his suggestions. Pt verbalized understanding. All questions if any were answered.

## 2023-08-22 NOTE — Telephone Encounter (Signed)
 See MyChart exchange from 7/11

## 2023-08-25 ENCOUNTER — Other Ambulatory Visit: Payer: Self-pay

## 2023-09-01 DIAGNOSIS — M67911 Unspecified disorder of synovium and tendon, right shoulder: Secondary | ICD-10-CM | POA: Diagnosis not present

## 2023-09-01 DIAGNOSIS — M19011 Primary osteoarthritis, right shoulder: Secondary | ICD-10-CM | POA: Diagnosis not present

## 2023-09-03 ENCOUNTER — Telehealth: Payer: Self-pay | Admitting: Nurse Practitioner

## 2023-09-03 DIAGNOSIS — I10 Essential (primary) hypertension: Secondary | ICD-10-CM | POA: Diagnosis not present

## 2023-09-03 DIAGNOSIS — I480 Paroxysmal atrial fibrillation: Secondary | ICD-10-CM | POA: Diagnosis not present

## 2023-09-03 DIAGNOSIS — E1169 Type 2 diabetes mellitus with other specified complication: Secondary | ICD-10-CM | POA: Diagnosis not present

## 2023-09-03 DIAGNOSIS — E785 Hyperlipidemia, unspecified: Secondary | ICD-10-CM | POA: Diagnosis not present

## 2023-09-03 DIAGNOSIS — I5022 Chronic systolic (congestive) heart failure: Secondary | ICD-10-CM | POA: Diagnosis not present

## 2023-09-03 LAB — BASIC METABOLIC PANEL WITH GFR
BUN/Creatinine Ratio: 23 (ref 12–28)
BUN: 25 mg/dL (ref 8–27)
CO2: 19 mmol/L — ABNORMAL LOW (ref 20–29)
Calcium: 10 mg/dL (ref 8.7–10.3)
Chloride: 103 mmol/L (ref 96–106)
Creatinine, Ser: 1.08 mg/dL — ABNORMAL HIGH (ref 0.57–1.00)
Glucose: 218 mg/dL — ABNORMAL HIGH (ref 70–99)
Potassium: 4.1 mmol/L (ref 3.5–5.2)
Sodium: 139 mmol/L (ref 134–144)
eGFR: 56 mL/min/1.73 — ABNORMAL LOW (ref >59)

## 2023-09-03 NOTE — Telephone Encounter (Signed)
 Pt dropped off 2 weeks worth of blood pressure readings.   Location: Darlene Wade mailbox

## 2023-09-04 ENCOUNTER — Ambulatory Visit: Payer: Self-pay | Admitting: Nurse Practitioner

## 2023-09-17 ENCOUNTER — Other Ambulatory Visit: Payer: Self-pay | Admitting: Medical

## 2023-09-18 ENCOUNTER — Emergency Department (HOSPITAL_BASED_OUTPATIENT_CLINIC_OR_DEPARTMENT_OTHER)
Admission: EM | Admit: 2023-09-18 | Discharge: 2023-09-18 | Disposition: A | Discharge status: Home / Self Care | Attending: Emergency Medicine | Admitting: Emergency Medicine

## 2023-09-18 ENCOUNTER — Other Ambulatory Visit: Payer: Self-pay

## 2023-09-18 ENCOUNTER — Emergency Department (HOSPITAL_BASED_OUTPATIENT_CLINIC_OR_DEPARTMENT_OTHER): Admitting: Radiology

## 2023-09-18 ENCOUNTER — Encounter (HOSPITAL_BASED_OUTPATIENT_CLINIC_OR_DEPARTMENT_OTHER): Payer: Self-pay | Admitting: Emergency Medicine

## 2023-09-18 DIAGNOSIS — Z7951 Long term (current) use of inhaled steroids: Secondary | ICD-10-CM | POA: Diagnosis not present

## 2023-09-18 DIAGNOSIS — J45909 Unspecified asthma, uncomplicated: Secondary | ICD-10-CM | POA: Diagnosis not present

## 2023-09-18 DIAGNOSIS — M79602 Pain in left arm: Secondary | ICD-10-CM | POA: Diagnosis not present

## 2023-09-18 DIAGNOSIS — I5040 Unspecified combined systolic (congestive) and diastolic (congestive) heart failure: Secondary | ICD-10-CM | POA: Diagnosis not present

## 2023-09-18 DIAGNOSIS — M12812 Other specific arthropathies, not elsewhere classified, left shoulder: Secondary | ICD-10-CM

## 2023-09-18 DIAGNOSIS — R000 Tachycardia, unspecified: Secondary | ICD-10-CM | POA: Diagnosis not present

## 2023-09-18 DIAGNOSIS — M75102 Unspecified rotator cuff tear or rupture of left shoulder, not specified as traumatic: Secondary | ICD-10-CM | POA: Insufficient documentation

## 2023-09-18 DIAGNOSIS — I110 Hypertensive heart disease with heart failure: Secondary | ICD-10-CM | POA: Diagnosis not present

## 2023-09-18 DIAGNOSIS — Z79899 Other long term (current) drug therapy: Secondary | ICD-10-CM | POA: Diagnosis not present

## 2023-09-18 DIAGNOSIS — M25512 Pain in left shoulder: Secondary | ICD-10-CM

## 2023-09-18 DIAGNOSIS — Z7982 Long term (current) use of aspirin: Secondary | ICD-10-CM | POA: Diagnosis not present

## 2023-09-18 DIAGNOSIS — I700 Atherosclerosis of aorta: Secondary | ICD-10-CM | POA: Diagnosis not present

## 2023-09-18 DIAGNOSIS — E119 Type 2 diabetes mellitus without complications: Secondary | ICD-10-CM | POA: Insufficient documentation

## 2023-09-18 DIAGNOSIS — Z7984 Long term (current) use of oral hypoglycemic drugs: Secondary | ICD-10-CM | POA: Insufficient documentation

## 2023-09-18 LAB — BASIC METABOLIC PANEL WITH GFR
Anion gap: 12 (ref 5–15)
BUN: 16 mg/dL (ref 8–23)
CO2: 24 mmol/L (ref 22–32)
Calcium: 10.6 mg/dL — ABNORMAL HIGH (ref 8.9–10.3)
Chloride: 102 mmol/L (ref 98–111)
Creatinine, Ser: 0.92 mg/dL (ref 0.44–1.00)
GFR, Estimated: >60 mL/min (ref >60)
Glucose, Bld: 190 mg/dL — ABNORMAL HIGH (ref 70–99)
Potassium: 4.1 mmol/L (ref 3.5–5.1)
Sodium: 138 mmol/L (ref 135–145)

## 2023-09-18 LAB — CBC
HCT: 38.3 % (ref 36.0–46.0)
Hemoglobin: 11.7 g/dL — ABNORMAL LOW (ref 12.0–15.0)
MCH: 26.5 pg (ref 26.0–34.0)
MCHC: 30.5 g/dL (ref 30.0–36.0)
MCV: 86.7 fL (ref 80.0–100.0)
Platelets: 261 K/uL (ref 150–400)
RBC: 4.42 MIL/uL (ref 3.87–5.11)
RDW: 16.4 % — ABNORMAL HIGH (ref 11.5–15.5)
WBC: 7.8 K/uL (ref 4.0–10.5)
nRBC: 0 % (ref 0.0–0.2)

## 2023-09-18 LAB — TROPONIN T, HIGH SENSITIVITY: Troponin T High Sensitivity: <15 ng/L (ref <19)

## 2023-09-18 MED ORDER — PREDNISONE 10 MG (21) PO TBPK: ORAL_TABLET | Freq: Every day | ORAL | 0 refills | Status: DC

## 2023-09-18 NOTE — ED Provider Notes (Signed)
 [place] EMERGENCY DEPARTMENT AT [facility] Provider Note   CSN: [id number] Arrival date & time: 09/18/23  1347     Patient presents with: Torticollis   Darlene Wade is a 68 y.o. female who presents with a 1 week history of neck pain, began when she woke up approximately a week ago with no previous aggravating factors of knowledge.  She had previous right shoulder pain that was diagnosed as potential rotator cuff injury as well as osteoarthritis.  She has been using topical diclofenac gel, using acetaminophen  orally, and applying heat with minimal effect on her pain.  She denies having any isolated weakness, however states that laying on the affected side intensifies her pain, as well as any range of motion with the neck.  States that laying on the unaffected side does help improve her pain however she still wakes up with intense pain in the left shoulder.  Denies any distal numbness or paresthesia.  Review of her previous medical diagnoses shows a diagnosis of essential hypertension, type 2 diabetes, steroid arthritis, mild intermittent asthma, combined systolic and diastolic heart failure, hyperlipidemia.   HPI     Prior to Admission medications   Medication Sig Start Date End Date Taking? Authorizing Provider  predniSONE  (STERAPRED UNI-PAK 21 TAB) 10 MG (21) TBPK tablet Take by mouth daily. Take 6 tabs by mouth daily  for 2 days, then 5 tabs for 2 days, then 4 tabs for 2 days, then 3 tabs for 2 days, 2 tabs for 2 days, then 1 tab by mouth daily for 2 days 09/18/23  Yes Myriam Dorn BROCKS, PA  Accu-Chek Softclix Lancets lancets Test 1-2 times daily 06/20/22   Tysinger, Alm RAMAN, PA-C  acetaminophen  (TYLENOL ) 650 MG CR tablet Take 650 mg by mouth as needed for pain.    [provider]  aspirin  EC 81 MG tablet Take 81 mg by mouth daily. Swallow whole.    [provider]  atorvastatin  (LIPITOR ) 40 MG tablet Take 1 tablet by mouth once daily 09/17/23   Tysinger, Alm RAMAN, PA-C  Blood Glucose Monitoring Suppl (ACCU-CHEK GUIDE) w/Device KIT Use to check with test strips 06/20/22   Tysinger, Alm RAMAN, PA-C  carvedilol  (COREG ) 12.5 MG tablet Take 1 tablet (12.5 mg total) by mouth 2 (two) times daily with a meal. Patient not taking: Reported on 08/05/2023 04/21/23   Nahser, Aleene PARAS, MD  Cholecalciferol (VITAMIN D ) 50 MCG (2000 UT) CAPS Take 1 capsule (2,000 Units total) by mouth daily. Patient not taking: Reported on 08/05/2023 12/19/22   Tysinger, Alm RAMAN, PA-C  clopidogrel  (PLAVIX ) 75 MG tablet Take 1 tablet (75 mg total) by mouth daily. Patient not taking: Reported on 08/20/2023 07/30/23   Ursuy, Renee Lynn, PA-C  dapagliflozin  propanediol (FARXIGA ) 5 MG TABS tablet Take 1 tablet (5 mg total) by mouth daily before breakfast. 08/20/23   Wyn Jackee VEAR Mickey., NP  gabapentin  (NEURONTIN ) 300 MG capsule Take 300 mg by mouth daily as needed (pain). 09/13/22   [provider]  glucose blood (ACCU-CHEK GUIDE) test strip Test 1-2 times daily 06/20/22   Tysinger, Alm RAMAN, PA-C  metoprolol  succinate (TOPROL  XL) 25 MG 24 hr tablet Take 1 tablet (25 mg total) by mouth daily. 08/22/23   Wyn Jackee VEAR Mickey., NP  pantoprazole  (PROTONIX ) 40 MG tablet Take 1 tablet (40 mg total) by mouth daily. 06/06/23 07/21/23  Ursuy, Renee Lynn, PA-C  sacubitril -valsartan  (ENTRESTO ) 24-26 MG Take 1 tablet by mouth 2 (two) times daily.  08/20/23   Wyn Jackee VEAR Mickey., NP  sitaGLIPtin -metformin  (JANUMET ) 50-500 MG tablet Take 1 tablet by mouth daily with breakfast. 12/19/22   Tysinger, Alm RAMAN, PA-C  spironolactone  (ALDACTONE ) 25 MG tablet Take 1 tablet by mouth once daily Patient not taking: Reported on 08/20/2023 10/15/22   Wyn Jackee VEAR Mickey., NP  traMADol  (ULTRAM ) 50 MG tablet Take 50 mg by mouth every 6 (six) hours as needed for moderate pain (pain score 4-6). 03/26/23   [provider]    Allergies: Jardiance  [empagliflozin ] and Naproxen    Review of Systems  Musculoskeletal:  Positive for arthralgias.   All other systems reviewed and are negative.   Updated Vital Signs BP 116/69 (BP Location: Right Arm)   Pulse 86   Temp 98.2 F (36.8 C)   Resp 16   SpO2 100%   Physical Exam Vitals and nursing note reviewed.  Constitutional:      General: She is not in acute distress.    Appearance: Normal appearance.  HENT:     Head: Normocephalic and atraumatic.     Mouth/Throat:     Mouth: Mucous membranes are moist.     Pharynx: Oropharynx is clear.  Eyes:     Extraocular Movements: Extraocular movements intact.     Conjunctiva/sclera: Conjunctivae normal.     Pupils: Pupils are equal, round, and reactive to light.  Cardiovascular:     Rate and Rhythm: Normal rate and regular rhythm.     Pulses: Normal pulses.     Heart sounds: Normal heart sounds. No murmur heard.    No friction rub. No gallop.  Pulmonary:     Effort: Pulmonary effort is normal.     Breath sounds: Normal breath sounds.  Abdominal:     General: Abdomen is flat. Bowel sounds are normal.     Palpations: Abdomen is soft.  Musculoskeletal:        General: Normal range of motion.     Right shoulder: Normal.     Left shoulder: Tenderness present.     Cervical back: Normal range of motion and neck supple.     Right lower leg: No edema.     Left lower leg: No edema.     Comments: Tenderness to palpation at the [facility] joint, as well as with palpation at the scapular spine.  Empty can test positive.  With painful arc, does have some discomfort but able to complete overhead extension to 180 degrees.  No pain with internal or external rotation.  Skin:    General: Skin is warm and dry.     Capillary Refill: Capillary refill takes less than 2 seconds.  Neurological:     General: No focal deficit present.     Mental Status: She is alert. Mental status is at baseline.  Psychiatric:        Mood and Affect: Mood normal.     (all labs ordered are listed, but only abnormal results are displayed) Labs Reviewed  BASIC METABOLIC  PANEL WITH GFR - Abnormal; Notable for the following components:      Result Value   Glucose, Bld 190 (*)    Calcium  10.6 (*)    All other components within normal limits  CBC - Abnormal; Notable for the following components:   Hemoglobin 11.7 (*)    RDW 16.4 (*)    All other components within normal limits  TROPONIN T, HIGH SENSITIVITY    EKG: EKG Interpretation Date/Time:  Thursday September 18 2023 14:01:02 EDT  Ventricular Rate:  104 PR Interval:  120 QRS Duration:  96 QT Interval:  325 QTC Calculation: 428 R Axis:   26  Text Interpretation: Sinus tachycardia Low voltage, precordial leads Borderline repolarization abnormality Confirmed by Zackowski, Scott [phone]) on 09/18/2023 2:04:31 PM  Radiology: [facility] Chest 2 View Result Date: 09/18/2023 CLINICAL DATA:  Left arm pain. EXAM: CHEST - 2 VIEW COMPARISON:  Chest radiograph dated 04/22/2017. FINDINGS: No focal consolidation, pleural effusion, pneumothorax. The cardiac silhouette is within normal limits. Atherosclerotic calcification of the aorta. Degenerative changes of spine. No acute osseous pathology. IMPRESSION: No active cardiopulmonary disease. Electronically Signed   By: Vanetta Chou M.D.   On: 09/18/2023 15:17     Procedures   Medications Ordered in the ED - No data to display  Clinical Course as of 09/19/23 0005  Thu Sep 18, 2023  1506 RDW(!): 16.4 Stable in comparison to previous results. [JG]    Clinical Course User Index [JG] Myriam Dorn BROCKS, PA                                 Medical Decision Making Amount and/or Complexity of Data Reviewed Labs: ordered. Decision-making details documented in ED Course. Radiology: ordered.  Risk Prescription drug management.   Medical Decision Making:   Darlene Wade is a 68 y.o. female who presented to the ED today with left shoulder pain detailed above.     Complete initial physical exam performed, notably the patient  was alert and oriented in no apparent  distress, physical exam as noted..    Reviewed and confirmed nursing documentation for past medical history, family history, social history.    Initial Assessment:   With the patient's presentation of left shoulder pain, differential diagnosis includes rotator cuff injury as well as osteoarthritis.  Further include possible diagnosis of acute bony injury of the left shoulder, however find this less likely as there is no crepitus or previous injury.  Consider possible ACS, however find this less likely as it does not radiate to her left shoulder and is not associated with any activity.  Further there is no dyspnea.   Initial Plan:  Screening labs including CBC and Metabolic panel to evaluate for infectious or metabolic etiology of disease.  CXR to evaluate for structural/infectious intrathoracic pathology.  EKG and serial troponin to evaluate for cardiac pathology. Objective evaluation as below reviewed   Initial Study Results:   Laboratory  All laboratory results reviewed without evidence of clinically relevant pathology.   Exceptions include: None  EKG EKG was reviewed independently. Rate, rhythm, axis, intervals all examined and without medically relevant abnormality. ST segments without concerns for elevations.    Radiology:  All images reviewed independently. Agree with radiology report at this time.   DG Chest 2 View Result Date: 09/18/2023 CLINICAL DATA:  Left arm pain. EXAM: CHEST - 2 VIEW COMPARISON:  Chest radiograph dated 04/22/2017. FINDINGS: No focal consolidation, pleural effusion, pneumothorax. The cardiac silhouette is within normal limits. Atherosclerotic calcification of the aorta. Degenerative changes of spine. No acute osseous pathology. IMPRESSION: No active cardiopulmonary disease. Electronically Signed   By: Vanetta Chou M.D.   On: 09/18/2023 15:17     Reassessment and Plan:   Extensive workup did not show a positive troponin nor did it show any acute changes on  chest x-ray.  EKG is unremarkable.  As such, believe this is either secondary to a rotator cuff strain  or also secondary to arthritic changes in the left shoulder.  Plan at this point is to discharge with referral to orthopedics, likely physical therapy.  Will prescribe a outpatient course of prednisone  for initial management of potential osteoarthritis/rotator cuff arthropathy.       Final diagnoses:  Rotator cuff arthropathy of left shoulder  Acute pain of left shoulder    ED Discharge Orders          Ordered    predniSONE  (STERAPRED UNI-PAK 21 TAB) 10 MG (21) TBPK tablet  Daily        09/18/23 1534               Myriam Dorn BROCKS, PA 09/19/23 0006    Ruthe Cornet, DO 09/22/23 1315

## 2023-09-18 NOTE — ED Notes (Signed)
 DC paperwork given and verbally understood.

## 2023-09-18 NOTE — ED Triage Notes (Signed)
 Left neck stiffness x1 week. Attributes to possibly sleeping on it wrong. Denies trauma or heavy lifting. Radiates into left arm. Denies CP SOB, N/V/D.

## 2023-09-26 DIAGNOSIS — M5412 Radiculopathy, cervical region: Secondary | ICD-10-CM | POA: Diagnosis not present

## 2023-09-29 ENCOUNTER — Telehealth: Payer: Self-pay | Admitting: Nurse Practitioner

## 2023-09-29 NOTE — Telephone Encounter (Signed)
 Please let Ms. Darlene Wade know that I reviewed her blood pressure readings they appear to be stable on her current medications.  Please advise patient to continue current medications for now as prescribed.  Please let us  know if you have any further questions.

## 2023-09-30 NOTE — Telephone Encounter (Addendum)
 Patient notified and states she is not taking Coreg , Spironolactone  or Entresto .   Patient just wants to make us  aware. Advised the patient that I would make Jackee aware and if he had any recommendations or comments I would give her a call back. She voiced understanding.

## 2023-10-01 DIAGNOSIS — M5412 Radiculopathy, cervical region: Secondary | ICD-10-CM | POA: Diagnosis not present

## 2023-10-01 MED ORDER — SACUBITRIL-VALSARTAN 24-26 MG PO TABS: 1.0000 | ORAL_TABLET | Freq: Two times a day (BID) | ORAL | 1 refills | Status: AC

## 2023-10-01 NOTE — Addendum Note (Signed)
 Addended by: SEBASTIAN JANESE GRADE on: 10/01/2023 12:06 PM   Modules accepted: Orders

## 2023-10-01 NOTE — Telephone Encounter (Signed)
 Spoke with the patient and gave her Jackee recommendations. She voiced understanding and states she is currently taking Farxiga  but will restart Entresto . New Entresto  rx has been sent to the pharmacy.

## 2023-10-02 ENCOUNTER — Other Ambulatory Visit (HOSPITAL_COMMUNITY): Payer: Self-pay

## 2023-10-18 DIAGNOSIS — M5412 Radiculopathy, cervical region: Secondary | ICD-10-CM | POA: Diagnosis not present

## 2023-11-13 ENCOUNTER — Other Ambulatory Visit: Payer: Self-pay | Admitting: Orthopedic Surgery

## 2023-11-17 ENCOUNTER — Other Ambulatory Visit: Payer: Self-pay | Admitting: Medical

## 2023-11-17 DIAGNOSIS — Z1231 Encounter for screening mammogram for malignant neoplasm of breast: Secondary | ICD-10-CM

## 2023-11-24 LAB — MICROALBUMIN / CREATININE URINE RATIO: Microalb Creat Ratio: <30

## 2023-12-03 ENCOUNTER — Encounter: Payer: Self-pay | Admitting: Internal Medicine

## 2023-12-03 NOTE — Progress Notes (Signed)
 Darlene Wade                                          MRN: [id number]   12/03/2023   The VBCI Quality Team Specialist reviewed this patient medical record for the purposes of chart review for care gap closure. The following were reviewed: chart review for care gap closure-kidney health evaluation for diabetes:eGFR  and uACR.    VBCI Quality Team

## 2023-12-08 ENCOUNTER — Telehealth: Payer: Self-pay

## 2023-12-08 ENCOUNTER — Ambulatory Visit: Admitting: Medical

## 2023-12-08 NOTE — Telephone Encounter (Signed)
 Called to check in with patient, who had LAAO on 06/05/2023.  Left message to call back. Will STOP PLAVIX  and confirm upcoming appointment with Dr. Floretta.

## 2023-12-08 NOTE — Telephone Encounter (Signed)
 Called to check in with patient, who had LAAO on 06/05/2023. The patient reports doing well with no issues.  Instructed her to STOP PLAVIX . Confirmed visit with Dr. Floretta on 02/10/2024. The patient understands to call with questions or concerns.  She was grateful for call and agreed with plan.

## 2023-12-08 NOTE — Addendum Note (Signed)
 Addended by: Magdalina Whitehead A on: 12/08/2023 11:08 AM   Modules accepted: Orders

## 2023-12-17 ENCOUNTER — Ambulatory Visit (INDEPENDENT_AMBULATORY_CARE_PROVIDER_SITE_OTHER): Payer: Self-pay | Admitting: Medical

## 2023-12-17 ENCOUNTER — Telehealth: Payer: Self-pay | Admitting: Internal Medicine

## 2023-12-17 VITALS — BP 110/64 | HR 64 | Wt 204.8 lb

## 2023-12-17 DIAGNOSIS — E1165 Type 2 diabetes mellitus with hyperglycemia: Secondary | ICD-10-CM

## 2023-12-17 DIAGNOSIS — Z7984 Long term (current) use of oral hypoglycemic drugs: Secondary | ICD-10-CM

## 2023-12-17 DIAGNOSIS — D649 Anemia, unspecified: Secondary | ICD-10-CM

## 2023-12-17 DIAGNOSIS — I10 Essential (primary) hypertension: Secondary | ICD-10-CM | POA: Diagnosis not present

## 2023-12-17 DIAGNOSIS — E1169 Type 2 diabetes mellitus with other specified complication: Secondary | ICD-10-CM

## 2023-12-17 DIAGNOSIS — E785 Hyperlipidemia, unspecified: Secondary | ICD-10-CM

## 2023-12-17 LAB — LIPID PANEL
Chol/HDL Ratio: 2.1 ratio (ref 0.0–4.4)
Cholesterol, Total: 131 mg/dL (ref 100–199)
HDL: 63 mg/dL (ref >39)
LDL Chol Calc (NIH): 54 mg/dL (ref 0–99)
Triglycerides: 69 mg/dL (ref 0–149)
VLDL Cholesterol Cal: 14 mg/dL (ref 5–40)

## 2023-12-17 NOTE — Progress Notes (Signed)
 Left message for pt to call back

## 2023-12-17 NOTE — Telephone Encounter (Signed)
-----   Message from Wade Gent sent at 12/17/2023  1:57 PM EST ----- Please let her know that I spoke to cardiology and they want her to be on the lower dose of Entresto  24/26 mg twice daily, and continue Farxiga .   Continue to hold off on spironolactone  and carvedilol .  Since her blood pressure is okay continue the  Metoprolol /Toprol -XL she is doing.  Follow-up with cardiology in December as planned ----- Message ----- From: Wyn Jackee VEAR Mickey., NP Sent: 12/17/2023   1:34 PM EST To: Alm GORMAN Gent, PA-C  Darlene Ludie,  Ms. Mcleroy  should be on the lower dose of Entresto  24/26 mg BID and  Farxiga . She should continue to hold Coreg  and Spironolactone  due to hypotension.  She is scheduled to see Dr. Floretta in December and may have further adjustments at that time.  Thanks, Jackee ----- Message ----- From: Gent Alm GORMAN DEVONNA Sent: 12/17/2023  10:39 AM EST To: Jackee VEAR Wyn Mickey., NP  Hello Mr. Wyn, I saw Darlene Wade today.  I wanted to clarify dosing for her.   I think she is still taking the higher Entresto  dose.   This is her report today: She is currently taking Toprol -XL 25 mg daily, Entresto  higher dose 49/51mg  BID and not the 24/26mg  BID.[name]  Not currently taking spironolactone .  Not taking carvedilol .  She is taking the Farxiga  5 mg daily alright so you are pretty sure as to the higher Entresto  current Let me know. Thanks [employer]

## 2023-12-17 NOTE — Telephone Encounter (Signed)
 Left message for pt to call back

## 2023-12-17 NOTE — Progress Notes (Addendum)
 Subjective: Chief Complaint  Patient presents with   Diabetes    Diabetes check, getting over a cold but still having issues trying to get it to break it up. Has upcoming surgery and needs it cleared up    History of Present Illness Darlene Wade is a 68 year old female with diabetes and hypertension who presents for a medication check.  Her blood sugar levels have been stable, with readings typically around 90 to 100 mg/dL, and the highest being 120 mg/dL. No hypoglycemic episodes have occurred. Her last hemoglobin A1c in June was 6.3%. She is currently taking Farxiga  5 mg daily and Janumet  50/500mg  daily. A recent microalbumin test was normal.  Her cholesterol levels have been stable in the past, though it has been a year since her last test. She is on atorvastatin  40 mg daily and aspirin  81 mg daily.  Regarding her hypertension and heart failure management, she is currently taking Entresto  49/51 mg twice daily, although there is some confusion about the prescribed dose. She is also on Toprol  XL 25 mg daily. She was previously on carvedilol  and spironolactone  but is no longer taking these medications.  She takes vitamin D  5000 IU daily, which is an over-the-counter supplement. Her calcium  levels have been slightly elevated in recent tests.  She is concerned about her current Medicare supplement insurance not covering her primary care doctor and some of her medications for the next year.  No other aggravating or relieving factors. No other complaint.  Past Medical History:  Diagnosis Date   Asthma 07/20/2018   one puff per day   Chronic combined systolic and diastolic heart failure (HCC) 03/03/2018   Echo 07/2018: EF 40-45, diff HK worse in Inf base, normal RVSF   Diabetes mellitus without complication (HCC) 2012   Diabetic eye exam (HCC)    [facility]   Dyspnea    Elbow fracture, right 2007   Former smoker    20 pack year history, quit 2010   Hyperlipidemia    Hypertension     Insomnia    Lung nodule    Chest CT 07/2018:  RLL nodule resolved.  3 mm subpleural LUL nodule.  Repeat in 1 year if high risk.    Obesity    PONV (postoperative nausea and vomiting) 2014   1 time   Wears glasses    reading   Current Outpatient Medications on File Prior to Visit  Medication Sig Dispense Refill   acetaminophen  (TYLENOL ) 650 MG CR tablet Take 650 mg by mouth as needed for pain.     aspirin  EC 81 MG tablet Take 81 mg by mouth daily. Swallow whole.     atorvastatin  (LIPITOR ) 40 MG tablet Take 1 tablet by mouth once daily 90 tablet 0   Cholecalciferol (VITAMIN D ) 50 MCG (2000 UT) CAPS Take 1 capsule (2,000 Units total) by mouth daily. 90 capsule 3   dapagliflozin  propanediol (FARXIGA ) 5 MG TABS tablet Take 1 tablet (5 mg total) by mouth daily before breakfast. 90 tablet 0   gabapentin  (NEURONTIN ) 300 MG capsule Take 300 mg by mouth daily as needed (pain).     metoprolol  succinate (TOPROL  XL) 25 MG 24 hr tablet Take 1 tablet (25 mg total) by mouth daily. 90 tablet 3   sacubitril -valsartan  (ENTRESTO ) 24-26 MG Take 1 tablet by mouth 2 (two) times daily. 180 tablet 1   sitaGLIPtin -metformin  (JANUMET ) 50-500 MG tablet Take 1 tablet by mouth daily with breakfast. 90 tablet 1   traMADol  (  ULTRAM ) 50 MG tablet Take 50 mg by mouth every 6 (six) hours as needed for moderate pain (pain score 4-6).     Accu-Chek Softclix Lancets lancets Test 1-2 times daily 100 each 2   Blood Glucose Monitoring Suppl (ACCU-CHEK GUIDE) w/Device KIT Use to check with test strips 1 kit 0   carvedilol  (COREG ) 12.5 MG tablet Take 1 tablet (12.5 mg total) by mouth 2 (two) times daily with a meal. (Patient not taking: Reported on 12/17/2023) 180 tablet 3   glucose blood (ACCU-CHEK GUIDE) test strip Test 1-2 times daily 100 each 2   spironolactone  (ALDACTONE ) 25 MG tablet Take 1 tablet by mouth once daily (Patient not taking: Reported on 12/17/2023) 90 tablet 3   No current facility-administered medications on file  prior to visit.   ROS as in subjective    Objective: BP 110/64   Pulse 64   Wt 204 lb 12.8 oz (92.9 kg)   BMI 37.46 kg/m   Gen: wd, wn ,nad Psych: pleasant, answers question approprietly Lungs clear, no wheezing, no rhonchi or rales Heart regular rhythm, normal S1-S2, no murmurs No extremity edema    Assessment and Plan Encounter Diagnoses  Name Primary?   Type 2 diabetes mellitus with hyperglycemia, without long-term current use of insulin  (HCC) Yes   Essential hypertension, benign    Serum calcium  elevated    Anemia, unspecified type    Hyperlipidemia associated with type 2 diabetes mellitus (HCC)     Type 2 diabetes mellitus Well-controlled with blood glucose 90-120 mg/dL. Last hemoglobin A1c 6.3% in June. - Continue Farxiga  5 mg daily  -continue Janumet  50/500mg  daily - Checked hemoglobin A1c today.  Essential hypertension Blood pressure well-controlled. Discrepancy in Entresto  dosage; cardiologist consultation needed. - Sent message to cardiology to verify Entresto  dosage. - Continue Entresto  and Toprol  XL 25mg  daily -currently not on spironolactone  and carvedilol  from last cardiology visit months ago -BP at goal  Hypercalcemia Elevated calcium  levels. Taking vitamin D  5000 IU daily. - Ordered blood work to recheck calcium  levels. - Consider reducing vitamin D  if calcium  remains elevated.  hyperlipidemia Cholesterol levels good a year ago. On atorvastatin  40 mg and aspirin  81 mg daily. - Ordered cholesterol blood test.  Hx/o anemia -updated labs today   [name] was seen today for diabetes.  Diagnoses and all orders for this visit:  Type 2 diabetes mellitus with hyperglycemia, without long-term current use of insulin  (HCC) -     Hemoglobin A1c  Essential hypertension, benign -     Comprehensive metabolic panel with GFR  Serum calcium  elevated -     Parathyroid hormone, intact (no Ca)  Anemia, unspecified type -     Iron -      CBC  Hyperlipidemia associated with type 2 diabetes mellitus (HCC) -     Lipid panel  Spent > 45 minutes face to face with patient in discussion of symptoms, evaluation, plan and recommendations.    F/u pending labs

## 2023-12-17 NOTE — Patient Instructions (Signed)
 Are not

## 2023-12-18 ENCOUNTER — Ambulatory Visit: Payer: Self-pay | Admitting: Medical

## 2023-12-18 ENCOUNTER — Other Ambulatory Visit: Payer: Self-pay | Admitting: Medical

## 2023-12-18 LAB — COMPREHENSIVE METABOLIC PANEL WITH GFR
ALT: 12 IU/L (ref 0–32)
AST: 18 IU/L (ref 0–40)
Albumin: 4.1 g/dL (ref 3.9–4.9)
Alkaline Phosphatase: 95 IU/L (ref 49–135)
BUN/Creatinine Ratio: 15 (ref 12–28)
BUN: 12 mg/dL (ref 8–27)
Bilirubin Total: 0.4 mg/dL (ref 0.0–1.2)
CO2: 20 mmol/L (ref 20–29)
Calcium: 10.3 mg/dL (ref 8.7–10.3)
Chloride: 107 mmol/L — AB (ref 96–106)
Creatinine, Ser: 0.79 mg/dL (ref 0.57–1.00)
Globulin, Total: 3.2 g/dL (ref 1.5–4.5)
Glucose: 106 mg/dL — AB (ref 70–99)
Potassium: 4.4 mmol/L (ref 3.5–5.2)
Sodium: 143 mmol/L (ref 134–144)
Total Protein: 7.3 g/dL (ref 6.0–8.5)
eGFR: 80 mL/min/1.73 (ref >59)

## 2023-12-18 LAB — HEMOGLOBIN A1C
Est. average glucose Bld gHb Est-mCnc: 148 mg/dL
Hgb A1c MFr Bld: 6.8 % — ABNORMAL HIGH (ref 4.8–5.6)

## 2023-12-18 LAB — CBC
Hematocrit: 38.4 % (ref 34.0–46.6)
Hemoglobin: 11.9 g/dL (ref 11.1–15.9)
MCH: 28.1 pg (ref 26.6–33.0)
MCHC: 31 g/dL — ABNORMAL LOW (ref 31.5–35.7)
MCV: 91 fL (ref 79–97)
Platelets: 305 x10E3/uL (ref 150–450)
RBC: 4.23 x10E6/uL (ref 3.77–5.28)
RDW: 15 % (ref 11.7–15.4)
WBC: 7.3 x10E3/uL (ref 3.4–10.8)

## 2023-12-18 LAB — PARATHYROID HORMONE, INTACT (NO CA): PTH: 43 pg/mL (ref 15–65)

## 2023-12-18 LAB — IRON: Iron: 44 ug/dL (ref 27–139)

## 2023-12-18 MED ORDER — ATORVASTATIN CALCIUM 40 MG PO TABS: 40.0000 mg | ORAL_TABLET | Freq: Every day | ORAL | 3 refills | Status: AC

## 2023-12-18 MED ORDER — JANUMET 50-500 MG PO TABS: 1.0000 | ORAL_TABLET | Freq: Every day | ORAL | 3 refills | Status: AC

## 2023-12-18 MED ORDER — DAPAGLIFLOZIN PROPANEDIOL 5 MG PO TABS: 5.0000 mg | ORAL_TABLET | Freq: Every day | ORAL | 3 refills | Status: DC

## 2023-12-18 NOTE — Progress Notes (Signed)
 Results to MyChart

## 2023-12-23 ENCOUNTER — Encounter: Payer: Self-pay | Admitting: Cardiology

## 2023-12-23 ENCOUNTER — Encounter (HOSPITAL_COMMUNITY): Payer: Self-pay

## 2023-12-24 ENCOUNTER — Encounter: Payer: Medicare Other | Admitting: Medical

## 2023-12-24 ENCOUNTER — Telehealth (HOSPITAL_BASED_OUTPATIENT_CLINIC_OR_DEPARTMENT_OTHER): Payer: Self-pay

## 2023-12-24 NOTE — Telephone Encounter (Signed)
Patient has been schedule for televisit

## 2023-12-24 NOTE — Telephone Encounter (Signed)
 While scheduling patients televisit she stated she takes aspirin  81 mg daily patient is scheduled for televisit on 11/17 procedure is on the 11/20

## 2023-12-24 NOTE — Telephone Encounter (Signed)
   Name: Darlene Wade  DOB: 12/31/1955  MRN: [id number]  Primary Cardiologist: None   Preoperative team, please contact this patient and set up a phone call appointment for further preoperative risk assessment. Please obtain consent and complete medication review. Thank you for your help.  I confirm that guidance regarding antiplatelet and oral anticoagulation therapy has been completed and, if necessary, noted below.  None requested.   I also confirmed the patient resides in the state of [place] . As per [facility] Medical Board telemedicine laws, the patient must reside in the state in which the provider is licensed.     [name] Hila, NP 12/24/2023, 4:44 PM [facility]

## 2023-12-24 NOTE — Telephone Encounter (Signed)
   Pre-operative Risk Assessment    Patient Name: Darlene Wade  DOB: 1955/10/10 MRN: [id number]   Date of last office visit: 08/20/23 Date of next office visit: 02/11/24   Request for Surgical Clearance    Procedure:  Anterior Cervical Decompression Fusion Cervical 3-4, Cervical 4-5   Date of Surgery:  Clearance 01/01/24                                Surgeon:  Eva Herring, MD Surgeon's Group or Practice Name:  [facility] Phone number:  [phone] Fax number:  [phone]   Type of Clearance Requested:   - Medical    Type of Anesthesia:  Not Indicated   Additional requests/questions:    Darlene Wade   12/24/2023, 1:13 PM  -----

## 2023-12-25 NOTE — Telephone Encounter (Signed)
 I will send to requesting office and inquire if surgeon will need ASA to be held. Pt is scheduled for tele preop appt 12/29/23 and procedure 01/01/24. Not sure if surgeon office advised pt of needing to hold ASA yes or no?

## 2023-12-26 NOTE — Telephone Encounter (Signed)
 Preoperative team, patient's aspirin  is not prescribed by cardiology.  Recommendations for holding aspirin  will need to come from prescribing provider.  Thank you.  Darlene Wade. Randee Huston NP-C     12/26/2023, 11:57 AM [facility] [address] 5th Floor [place], [place] [address] Office [phone]

## 2023-12-26 NOTE — Progress Notes (Signed)
 Surgical Instructions   Your procedure is scheduled on 01/01/2024 at 1032 A.M. Report to [facility] Main Entrance A at 0730 A.M., then check in with the Admitting office. Any questions or running late day of surgery: call [phone]  Questions prior to your surgery date: call [phone], Monday-Friday, 8am-4pm. If you experience any cold or flu symptoms such as cough, fever, chills, shortness of breath, etc. between now and your scheduled surgery, please notify us  at the above number.     Remember:  Do not eat after midnight the night before your surgery  You may drink clear liquids until 7:30 A.M. the morning of your surgery.   Clear liquids allowed are: Water , Non-Citrus Juices (without pulp), Carbonated Beverages, Clear Tea (no milk, honey, etc.), Black Coffee Only (NO MILK, CREAM OR POWDERED CREAMER of any kind), and Gatorade.    Take these medicines the morning of surgery with A SIP OF WATER   atorvastatin  (LIPITOR )  metoprolol  succinate (TOPROL  XL)   May take these medicines IF NEEDED: gabapentin  (NEURONTIN )  traMADol  (ULTRAM )   Follow your surgeon's instructions on when to stop Asprin.  If no instructions were given by your surgeon then you will need to call the office to get those instructions.     One week prior to surgery, STOP taking any Aspirin  (unless otherwise instructed by your surgeon) Aleve, Naproxen, Ibuprofen , Motrin , Advil , Goody's, BC's, all herbal medications, fish oil, and non-prescription vitamins.          WHAT DO I DO ABOUT MY DIABETES MEDICATION?   Stop taking dapagliflozin  propanediol (FARXIGA ) 3 days prior to surgery. Last dose will be 12/28/2023. Do not take sitaGLIPtin -metformin  (JANUMET )  the morning of surgery.   HOW TO MANAGE YOUR DIABETES BEFORE AND AFTER SURGERY  Why is it important to control my blood sugar before and after surgery? Improving blood sugar levels before and after surgery helps healing and can limit problems. A way of  improving blood sugar control is eating a healthy diet by:  Eating less sugar and carbohydrates  Increasing activity/exercise  Talking with your doctor about reaching your blood sugar goals High blood sugars (greater than 180 mg/dL) can raise your risk of infections and slow your recovery, so you will need to focus on controlling your diabetes during the weeks before surgery. Make sure that the doctor who takes care of your diabetes knows about your planned surgery including the date and location.  How do I manage my blood sugar before surgery? Check your blood sugar at least 4 times a day, starting 2 days before surgery, to make sure that the level is not too high or low.  Check your blood sugar the morning of your surgery when you wake up and every 2 hours until you get to the Short Stay unit.  If your blood sugar is less than 70 mg/dL, you will need to treat for low blood sugar: Do not take insulin . Treat a low blood sugar (less than 70 mg/dL) with  cup of clear juice (cranberry or apple), 4 glucose tablets, OR glucose gel. Recheck blood sugar in 15 minutes after treatment (to make sure it is greater than 70 mg/dL). If your blood sugar is not greater than 70 mg/dL on recheck, call [phone] for further instructions. Report your blood sugar to the short stay nurse when you get to Short Stay.  If you are admitted to the hospital after surgery: Your blood sugar will be checked by the staff and you will probably be given  insulin  after surgery (instead of oral diabetes medicines) to make sure you have good blood sugar levels. The goal for blood sugar control after surgery is 80-180 mg/dL.             Do NOT Smoke (Tobacco/Vaping) for 24 hours prior to your procedure.  If you use a CPAP at night, you may bring your mask/headgear for your overnight stay.   You will be asked to remove any contacts, glasses, piercing's, hearing aid's, dentures/partials prior to surgery. Please bring cases for  these items if needed.    Patients discharged the day of surgery will not be allowed to drive home, and someone needs to stay with them for 24 hours.  SURGICAL WAITING ROOM VISITATION Patients may have no more than 2 support people in the waiting area - these visitors may rotate.   Pre-op nurse will coordinate an appropriate time for 1 ADULT support person, who may not rotate, to accompany patient in pre-op.  Children under the age of 47 must have an adult with them who is not the patient and must remain in the main waiting area with an adult.  If the patient needs to stay at the hospital during part of their recovery, the visitor guidelines for inpatient rooms apply.  Please refer to the [facility] website for the visitor guidelines for any additional information.   If you received a COVID test during your pre-op visit  it is requested that you wear a mask when out in public, stay away from anyone that may not be feeling well and notify your surgeon if you develop symptoms. If you have been in contact with anyone that has tested positive in the last 10 days please notify you surgeon.      Pre-operative 4 CHG Bathing Instructions   You can play a key role in reducing the risk of infection after surgery. Your skin needs to be as free of germs as possible. You can reduce the number of germs on your skin by washing with CHG (chlorhexidine  gluconate) soap before surgery. CHG is an antiseptic soap that kills germs and continues to kill germs even after washing.   DO NOT use if you have an allergy to chlorhexidine /CHG or antibacterial soaps. If your skin becomes reddened or irritated, stop using the CHG and notify one of our RNs at [phone].   Please shower with the CHG soap starting 4 days before surgery using the following schedule:     Please keep in mind the following:  DO NOT shave, including legs and underarms, starting the day of your first shower.   You may shave your face at  any point before/day of surgery.  Place clean sheets on your bed the day you start using CHG soap. Use a clean washcloth (not used since being washed) for each shower. DO NOT sleep with pets once you start using the CHG.   CHG Shower Instructions:  Wash your face and private area with normal soap. If you choose to wash your hair, wash first with your normal shampoo.  After you use shampoo/soap, rinse your hair and body thoroughly to remove shampoo/soap residue.  Turn the water  OFF and apply  bottle of CHG soap to a CLEAN washcloth.  Apply CHG soap ONLY FROM YOUR NECK DOWN TO YOUR TOES (washing for 3-5 minutes)  DO NOT use CHG soap on face, private areas, open wounds, or sores.  Pay special attention to the area where your surgery is being performed.  If you are having back surgery, having someone wash your back for you may be helpful. Wait 2 minutes after CHG soap is applied, then you may rinse off the CHG soap.  Pat dry with a clean towel  Put on clean clothes/pajamas   If you choose to wear lotion, please use ONLY the CHG-compatible lotions that are listed below.  Additional instructions for the day of surgery:  If you choose, you may shower the morning of surgery with an antibacterial soap.  DO NOT APPLY any lotions, deodorants, cologne, or perfumes.   Do not bring valuables to the hospital. [facility] is not responsible for any belongings/valuables. Do not wear nail polish, gel polish, artificial nails, or any other type of covering on natural nails (fingers and toes) Do not wear jewelry or makeup Put on clean/comfortable clothes.  Please brush your teeth.  Ask your nurse before applying any prescription medications to the skin.     CHG Compatible Lotions   Aveeno Moisturizing lotion  Cetaphil Moisturizing Cream  Cetaphil Moisturizing Lotion  Clairol Herbal Essence Moisturizing Lotion, Dry Skin  Clairol Herbal Essence Moisturizing Lotion, Extra Dry Skin  Clairol Herbal  Essence Moisturizing Lotion, Normal Skin  Curel Age Defying Therapeutic Moisturizing Lotion with Alpha Hydroxy  Curel Extreme Care Body Lotion  Curel Soothing Hands Moisturizing Hand Lotion  Curel Therapeutic Moisturizing Cream, Fragrance-Free  Curel Therapeutic Moisturizing Lotion, Fragrance-Free  Curel Therapeutic Moisturizing Lotion, Original Formula  Eucerin Daily Replenishing Lotion  Eucerin Dry Skin Therapy Plus Alpha Hydroxy Crme  Eucerin Dry Skin Therapy Plus Alpha Hydroxy Lotion  Eucerin Original Crme  Eucerin Original Lotion  Eucerin Plus Crme Eucerin Plus Lotion  Eucerin TriLipid Replenishing Lotion  Keri Anti-Bacterial Hand Lotion  Keri Deep Conditioning Original Lotion Dry Skin Formula Softly Scented  Keri Deep Conditioning Original Lotion, Fragrance Free Sensitive Skin Formula  Keri Lotion Fast Absorbing Fragrance Free Sensitive Skin Formula  Keri Lotion Fast Absorbing Softly Scented Dry Skin Formula  Keri Original Lotion  Keri Skin Renewal Lotion Keri Silky Smooth Lotion  Keri Silky Smooth Sensitive Skin Lotion  Nivea Body Creamy Conditioning Oil  Nivea Body Extra Enriched Lotion  Nivea Body Original Lotion  Nivea Body Sheer Moisturizing Lotion Nivea Crme  Nivea Skin Firming Lotion  NutraDerm 30 Skin Lotion  NutraDerm Skin Lotion  NutraDerm Therapeutic Skin Cream  NutraDerm Therapeutic Skin Lotion  ProShield Protective Hand Cream  Provon moisturizing lotion  Please read over the following fact sheets that you were given.

## 2023-12-26 NOTE — Telephone Encounter (Signed)
 Arland with Dr. Andrey office is following up to clarify--incorrect provider is listed as the surgeon. Per Arland, Dr. Beuford is performing the procedure, not Dr. Dozier. Please update.  Arland would like advisement on holding ASA. Their office would like to hold blood thinner therapy for procedure + she mentions patient will undergo general anesthesia.

## 2023-12-26 NOTE — Telephone Encounter (Signed)
 Left Arland, surgery scheduler that I got her message, see notes from her call today. I will send back to preop APP to review if we need to instruct pt when to begin to hold ASA.    I will amend the clearance request notes so they are correct for the tele preop appt 12/29/23.    CORRECTION ON CLEARANCE REQUEST ENTERED IN CHART:   SURGEON: DR. BEUFORD MED HOLD: NEED ASA RECOMMENDATIONS ANESTHESIA: GENERAL

## 2023-12-26 NOTE — Telephone Encounter (Signed)
 Will update the surgeon office to see notes from preop APP about ASA .

## 2023-12-29 ENCOUNTER — Other Ambulatory Visit: Payer: Self-pay

## 2023-12-29 ENCOUNTER — Encounter (HOSPITAL_COMMUNITY): Payer: Self-pay

## 2023-12-29 ENCOUNTER — Encounter (HOSPITAL_COMMUNITY)
Admission: RE | Admit: 2023-12-29 | Discharge: 2023-12-29 | Disposition: A | Discharge status: Home / Self Care | Source: Ambulatory Visit | Attending: Orthopedic Surgery | Admitting: Orthopedic Surgery

## 2023-12-29 ENCOUNTER — Ambulatory Visit (INDEPENDENT_AMBULATORY_CARE_PROVIDER_SITE_OTHER): Payer: Self-pay | Admitting: Medical

## 2023-12-29 ENCOUNTER — Ambulatory Visit: Attending: Cardiovascular Disease

## 2023-12-29 VITALS — BP 123/70 | HR 75 | Temp 98.2°F | Resp 18 | Ht 62.0 in | Wt 205.6 lb

## 2023-12-29 VITALS — BP 124/80 | HR 72 | Ht 62.0 in | Wt 206.2 lb

## 2023-12-29 DIAGNOSIS — Z6837 Body mass index (BMI) 37.0-37.9, adult: Secondary | ICD-10-CM | POA: Diagnosis not present

## 2023-12-29 DIAGNOSIS — M4722 Other spondylosis with radiculopathy, cervical region: Secondary | ICD-10-CM | POA: Insufficient documentation

## 2023-12-29 DIAGNOSIS — Z87891 Personal history of nicotine dependence: Secondary | ICD-10-CM | POA: Diagnosis not present

## 2023-12-29 DIAGNOSIS — I5042 Chronic combined systolic (congestive) and diastolic (congestive) heart failure: Secondary | ICD-10-CM | POA: Insufficient documentation

## 2023-12-29 DIAGNOSIS — I10 Essential (primary) hypertension: Secondary | ICD-10-CM

## 2023-12-29 DIAGNOSIS — E669 Obesity, unspecified: Secondary | ICD-10-CM | POA: Diagnosis not present

## 2023-12-29 DIAGNOSIS — I480 Paroxysmal atrial fibrillation: Secondary | ICD-10-CM | POA: Insufficient documentation

## 2023-12-29 DIAGNOSIS — E1165 Type 2 diabetes mellitus with hyperglycemia: Secondary | ICD-10-CM | POA: Diagnosis not present

## 2023-12-29 DIAGNOSIS — E119 Type 2 diabetes mellitus without complications: Secondary | ICD-10-CM

## 2023-12-29 DIAGNOSIS — E785 Hyperlipidemia, unspecified: Secondary | ICD-10-CM | POA: Insufficient documentation

## 2023-12-29 DIAGNOSIS — J45909 Unspecified asthma, uncomplicated: Secondary | ICD-10-CM | POA: Insufficient documentation

## 2023-12-29 DIAGNOSIS — Z0000 Encounter for general adult medical examination without abnormal findings: Secondary | ICD-10-CM

## 2023-12-29 DIAGNOSIS — Z01812 Encounter for preprocedural laboratory examination: Secondary | ICD-10-CM | POA: Insufficient documentation

## 2023-12-29 DIAGNOSIS — Z780 Asymptomatic menopausal state: Secondary | ICD-10-CM

## 2023-12-29 DIAGNOSIS — E559 Vitamin D deficiency, unspecified: Secondary | ICD-10-CM

## 2023-12-29 DIAGNOSIS — G4733 Obstructive sleep apnea (adult) (pediatric): Secondary | ICD-10-CM | POA: Diagnosis not present

## 2023-12-29 DIAGNOSIS — Z7984 Long term (current) use of oral hypoglycemic drugs: Secondary | ICD-10-CM

## 2023-12-29 DIAGNOSIS — Z01818 Encounter for other preprocedural examination: Secondary | ICD-10-CM

## 2023-12-29 DIAGNOSIS — Z01810 Encounter for preprocedural cardiovascular examination: Secondary | ICD-10-CM

## 2023-12-29 DIAGNOSIS — Z95818 Presence of other cardiac implants and grafts: Secondary | ICD-10-CM

## 2023-12-29 DIAGNOSIS — I110 Hypertensive heart disease with heart failure: Secondary | ICD-10-CM | POA: Insufficient documentation

## 2023-12-29 DIAGNOSIS — I700 Atherosclerosis of aorta: Secondary | ICD-10-CM | POA: Diagnosis not present

## 2023-12-29 DIAGNOSIS — E1169 Type 2 diabetes mellitus with other specified complication: Secondary | ICD-10-CM

## 2023-12-29 HISTORY — DX: Heart failure, unspecified: I50.9

## 2023-12-29 HISTORY — DX: Unspecified atrial fibrillation: I48.91

## 2023-12-29 HISTORY — DX: Sleep apnea, unspecified: G47.30

## 2023-12-29 HISTORY — DX: Failed or difficult intubation, initial encounter: T88.4XXA

## 2023-12-29 LAB — CBC
HCT: 38.6 % (ref 36.0–46.0)
Hemoglobin: 11.9 g/dL — ABNORMAL LOW (ref 12.0–15.0)
MCH: 28.1 pg (ref 26.0–34.0)
MCHC: 30.8 g/dL (ref 30.0–36.0)
MCV: 91 fL (ref 80.0–100.0)
Platelets: 270 K/uL (ref 150–400)
RBC: 4.24 MIL/uL (ref 3.87–5.11)
RDW: 15.2 % (ref 11.5–15.5)
WBC: 7.2 K/uL (ref 4.0–10.5)
nRBC: 0 % (ref 0.0–0.2)

## 2023-12-29 LAB — GLUCOSE, CAPILLARY: Glucose-Capillary: 130 mg/dL — ABNORMAL HIGH (ref 70–99)

## 2023-12-29 LAB — BASIC METABOLIC PANEL WITH GFR
Anion gap: 10 (ref 5–15)
BUN: 10 mg/dL (ref 8–23)
CO2: 25 mmol/L (ref 22–32)
Calcium: 9.7 mg/dL (ref 8.9–10.3)
Chloride: 107 mmol/L (ref 98–111)
Creatinine, Ser: 0.85 mg/dL (ref 0.44–1.00)
GFR, Estimated: >60 mL/min (ref >60)
Glucose, Bld: 116 mg/dL — ABNORMAL HIGH (ref 70–99)
Potassium: 3.7 mmol/L (ref 3.5–5.1)
Sodium: 142 mmol/L (ref 135–145)

## 2023-12-29 LAB — SURGICAL PCR SCREEN
MRSA, PCR: NEGATIVE
Staphylococcus aureus: NEGATIVE

## 2023-12-29 NOTE — Progress Notes (Signed)
 Virtual Visit via Telephone Note   Because of Darlene Wade co-morbid illnesses, she is at least at moderate risk for complications without adequate follow up.  This format is felt to be most appropriate for this patient at this time.  Due to technical limitations with video connection (technology), today's appointment will be conducted as an audio only telehealth visit, and Darlene Wade verbally agreed to proceed in this manner.   All issues noted in this document were discussed and addressed.  No physical exam could be performed with this format.  Evaluation Performed:  Preoperative cardiovascular risk assessment _____________   Date:  12/29/2023   Patient ID:  Darlene Wade, Darlene Wade March 21, 1955, MRN [id number] Patient Location:  Home Provider location:   Office  Primary Care Provider:  Bulah Alm RAMAN, PA-C Primary Cardiologist:  None  Chief Complaint / Patient Profile   68 y.o. y/o female with a h/o essential hypertension, chronic combined systolic and diastolic CHF, paroxysmal atrial fibrillation, Watchman implant who is pending anterior cervical decompression fusion cervical 3-4, cervical 4-5 and presents today for telephonic preoperative cardiovascular risk assessment.  History of Present Illness    Darlene Wade is a 68 y.o. female who presents via audio/video conferencing for a telehealth visit today.  Pt was last seen in cardiology clinic on 08/20/2023 by Jackee Alberts, NP.  At that time Darlene Wade was doing well .  The patient is now pending procedure as outlined above. Since her last visit, she continues to be stable from a cardiac standpoint.  Today she denies chest pain, shortness of breath, lower extremity edema, fatigue, palpitations, melena, hematuria, hemoptysis, diaphoresis, weakness, presyncope, syncope, orthopnea, and PND.   Past Medical History    Past Medical History:  Diagnosis Date   Asthma 07/20/2018   one puff per day   Chronic combined systolic and  diastolic heart failure (HCC) 03/03/2018   Echo 07/2018: EF 40-45, diff HK worse in Inf base, normal RVSF   Diabetes mellitus without complication (HCC) 2012   Diabetic eye exam (HCC)    [employer]   Dyspnea    Elbow fracture, right 2007   Former smoker    20 pack year history, quit 2010   Hyperlipidemia    Hypertension    Insomnia    Lung nodule    Chest CT 07/2018:  RLL nodule resolved.  3 mm subpleural LUL nodule.  Repeat in 1 year if high risk.    Obesity    PONV (postoperative nausea and vomiting) 2014   1 time   Wears glasses    reading   Past Surgical History:  Procedure Laterality Date   ABDOMINAL HYSTERECTOMY  1986   ATRIAL FIBRILLATION ABLATION N/A 06/05/2023   Procedure: ATRIAL FIBRILLATION ABLATION;  Surgeon: Cindie Ole DASEN, MD;  Location: [facility];  Service: Cardiovascular;  Laterality: N/A;   BREAST CYST ASPIRATION  2015   BREAST EXCISIONAL BIOPSY Left    BREAST LUMPECTOMY WITH NEEDLE LOCALIZATION Left 10/07/2012   Procedure: BREAST LUMPECTOMY WITH NEEDLE LOCALIZATION;  Surgeon: Donnice POUR. Belinda, MD;  Location: [facility];  Service: General;  Laterality: Left;   BREAST SURGERY     COLONOSCOPY  01/2016   01/2016 with Dr. Legrand, tubular adenoma polpy; 2003 with Dr. Kristie   ELBOW ARTHROPLASTY  2006   rt-fx   FOOT ARTHROTOMY  12/2010   foot fusion, right   FOOT MASS EXCISION  04/2012   left-fusion   HYSTERECTOMY ABDOMINAL  WITH SALPINGECTOMY Bilateral 1985   JOINT REPLACEMENT  08/10/2018   Total knee replacement   LEFT ATRIAL APPENDAGE OCCLUSION N/A 06/05/2023   Procedure: LEFT ATRIAL APPENDAGE OCCLUSION;  Surgeon: Cindie Ole DASEN, MD;  Location: [facility];  Service: Cardiovascular;  Laterality: N/A;   PARTIAL HYSTERECTOMY  age 46   uterine fibroids, still has ovaries   TONSILLECTOMY     TOTAL KNEE ARTHROPLASTY Left 08/10/2018   Procedure: Left Knee Arthroplasty;  Surgeon: Liam Lerner, MD;  Location: [facility];  Service:  Orthopedics;  Laterality: Left;   TRANSESOPHAGEAL ECHOCARDIOGRAM (CATH LAB) N/A 06/05/2023   Procedure: TRANSESOPHAGEAL ECHOCARDIOGRAM;  Surgeon: Cindie Ole DASEN, MD;  [facility];  Service: Cardiovascular;  Laterality: N/A;    Allergies  Allergies  Allergen Reactions   Jardiance  [Empagliflozin ]     Yeast infections, tolerates farxiga  though   Naproxen Nausea Only    Home Medications    Prior to Admission medications   Medication Sig Start Date End Date Taking? Authorizing Provider  Accu-Chek Softclix Lancets lancets Test 1-2 times daily 06/20/22   Tysinger, Alm RAMAN, PA-C  aspirin  EC 81 MG tablet Take 81 mg by mouth daily. Swallow whole.    [provider]  atorvastatin  (LIPITOR ) 40 MG tablet Take 1 tablet (40 mg total) by mouth daily. 12/18/23   Tysinger, Alm RAMAN, PA-C  Blood Glucose Monitoring Suppl (ACCU-CHEK GUIDE) w/Device KIT Use to check with test strips 06/20/22   Tysinger, Alm RAMAN, PA-C  Cholecalciferol (VITAMIN D ) 50 MCG (2000 UT) CAPS Take 1 capsule (2,000 Units total) by mouth daily. 12/19/22   Tysinger, Alm RAMAN, PA-C  dapagliflozin  propanediol (FARXIGA ) 5 MG TABS tablet Take 1 tablet (5 mg total) by mouth daily before breakfast. 12/18/23   Tysinger, Alm RAMAN, PA-C  diphenhydramine -acetaminophen  (TYLENOL  PM) 25-500 MG TABS tablet Take 1 tablet by mouth at bedtime as needed.    [provider]  gabapentin  (NEURONTIN ) 300 MG capsule Take 300 mg by mouth daily as needed (pain). Patient not taking: Reported on 12/24/2023 09/13/22   [provider]  glucose blood (ACCU-CHEK GUIDE) test strip Test 1-2 times daily 06/20/22   Tysinger, Alm RAMAN, PA-C  Hydrocortisone Acetate (VAGISIL EX) Apply 1 Application topically daily as needed (yeast infection).    [provider]  metoprolol  succinate (TOPROL  XL) 25 MG 24 hr tablet Take 1 tablet (25 mg total) by mouth daily. 08/22/23   Wyn Jackee VEAR Mickey., NP  sacubitril -valsartan  (ENTRESTO ) 24-26 MG  Take 1 tablet by mouth 2 (two) times daily. 10/01/23   Wyn Jackee VEAR Mickey., NP  sitaGLIPtin -metformin  (JANUMET ) 50-500 MG tablet Take 1 tablet by mouth daily with breakfast. 12/18/23   Tysinger, Alm RAMAN, PA-C  traMADol  (ULTRAM ) 50 MG tablet Take 50 mg by mouth every 6 (six) hours as needed for moderate pain (pain score 4-6). Patient not taking: Reported on 12/24/2023 03/26/23   [provider]    Physical Exam    Vital Signs:  EDYTH GLOMB does not have vital signs available for review today.  Given telephonic nature of communication, physical exam is limited. AAOx3. NAD. Normal affect.  Speech and respirations are unlabored.  Accessory Clinical Findings    None  Assessment & Plan    1.  Preoperative Cardiovascular Risk Assessment: Anterior Cervical Decompression Fusion Cervical 3-4, Cervical 4-5    Date of Surgery:  Clearance 01/01/24  Surgeon:  Eva Herring, MD Surgeon's Group or Practice Name:  [facility] Phone number:  [phone] Fax number:  [phone]    Primary Cardiologist: None  Chart reviewed as part of pre-operative protocol coverage. Given past medical history and time since last visit, based on ACC/AHA guidelines, SHALAE BELMONTE would be at acceptable risk for the planned procedure without further cardiovascular testing.   Her RCRI is moderate risk, 6.6% risk of major cardiac event.  She is able to complete greater than 4 METS of physical activity.  Patient was advised that if she develops new symptoms prior to surgery to contact our office to arrange a follow-up appointment.  He verbalized understanding.  Patient presents aspirin  is not prescribed by cardiology.  Recommendations for holding aspirin  will need to come from prescribing provider.  Regarding ASA therapy, we recommend continuation of ASA throughout the perioperative period. However, if the surgeon feels that cessation of ASA is required in the  perioperative period, it may be stopped 5-7 days prior to surgery with a plan to resume it as soon as felt to be feasible from a surgical standpoint in the post-operative period.    I will route this recommendation to the requesting party via Epic fax function and remove from pre-op pool.       Time:   Today, I have spent 8 minutes with the patient with telehealth technology discussing medical history, symptoms, and management plan.  I spent 10 minutes reviewing patient's past cardiac history and cardiac medications.    Josefa CHRISTELLA Beauvais, NP  12/29/2023, 8:04 AM

## 2023-12-29 NOTE — Progress Notes (Signed)
 Surgical Instructions   Your procedure is scheduled on 01/01/2024  Report to [facility] Main Entrance A at 0730 A.M., then check in with the Admitting office. Any questions or running late day of surgery: call [phone]  Questions prior to your surgery date: call [phone], Monday-Friday, 8am-4pm. If you experience any cold or flu symptoms such as cough, fever, chills, shortness of breath, etc. between now and your scheduled surgery, please notify us  at the above number.     Remember:  Do not eat after midnight the night before your surgery  You may drink clear liquids until 7:30 A.M. the morning of your surgery.   Clear liquids allowed are: Water , Non-Citrus Juices (without pulp), Carbonated Beverages, Clear Tea (no milk, honey, etc.), Black Coffee Only (NO MILK, CREAM OR POWDERED CREAMER of any kind), and Gatorade.    Take these medicines the morning of surgery with A SIP OF WATER   atorvastatin  (LIPITOR )  metoprolol  succinate (TOPROL  XL)   May take these medicines IF NEEDED: gabapentin  (NEURONTIN )  traMADol  (ULTRAM )   Follow your surgeon's instructions on when to stop Asprin.  If no instructions were given by your surgeon then you will need to call the office to get those instructions.     One week prior to surgery, STOP taking any  Aleve, Naproxen, Ibuprofen , Motrin , Advil , Goody's, BC's, all herbal medications, fish oil, and non-prescription vitamins.          WHAT DO I DO ABOUT MY DIABETES MEDICATION?   Stop taking dapagliflozin  propanediol (FARXIGA ) 3 days prior to surgery. Last dose will be 12/28/2023. Do not take sitaGLIPtin -metformin  (JANUMET )  the morning of surgery.   HOW TO MANAGE YOUR DIABETES BEFORE AND AFTER SURGERY  Why is it important to control my blood sugar before and after surgery? Improving blood sugar levels before and after surgery helps healing and can limit problems. A way of improving blood sugar control is eating a healthy diet by:   Eating less sugar and carbohydrates  Increasing activity/exercise  Talking with your doctor about reaching your blood sugar goals High blood sugars (greater than 180 mg/dL) can raise your risk of infections and slow your recovery, so you will need to focus on controlling your diabetes during the weeks before surgery. Make sure that the doctor who takes care of your diabetes knows about your planned surgery including the date and location.  How do I manage my blood sugar before surgery? Check your blood sugar at least 4 times a day, starting 2 days before surgery, to make sure that the level is not too high or low.  Check your blood sugar the morning of your surgery when you wake up and every 2 hours until you get to the Short Stay unit.  If your blood sugar is less than 70 mg/dL, you will need to treat for low blood sugar: Do not take insulin . Treat a low blood sugar (less than 70 mg/dL) with  cup of clear juice (cranberry or apple), 4 glucose tablets, OR glucose gel. Recheck blood sugar in 15 minutes after treatment (to make sure it is greater than 70 mg/dL). If your blood sugar is not greater than 70 mg/dL on recheck, call [phone] for further instructions. Report your blood sugar to the short stay nurse when you get to Short Stay.  If you are admitted to the hospital after surgery: Your blood sugar will be checked by the staff and you will probably be given insulin  after surgery (instead of oral diabetes medicines)  to make sure you have good blood sugar levels. The goal for blood sugar control after surgery is 80-180 mg/dL.             Do NOT Smoke (Tobacco/Vaping) for 24 hours prior to your procedure.  If you use a CPAP at night, you may bring your mask/headgear for your overnight stay.   You will be asked to remove any contacts, glasses, piercing's, hearing aid's, dentures/partials prior to surgery. Please bring cases for these items if needed.    Patients discharged the day of  surgery will not be allowed to drive home, and someone needs to stay with them for 24 hours.  SURGICAL WAITING ROOM VISITATION Patients may have no more than 2 support people in the waiting area - these visitors may rotate.   Pre-op nurse will coordinate an appropriate time for 1 ADULT support person, who may not rotate, to accompany patient in pre-op.  Children under the age of 53 must have an adult with them who is not the patient and must remain in the main waiting area with an adult.  If the patient needs to stay at the hospital during part of their recovery, the visitor guidelines for inpatient rooms apply.  Please refer to the [facility] website for the visitor guidelines for any additional information.   If you received a COVID test during your pre-op visit  it is requested that you wear a mask when out in public, stay away from anyone that may not be feeling well and notify your surgeon if you develop symptoms. If you have been in contact with anyone that has tested positive in the last 10 days please notify you surgeon.      Pre-operative 4 CHG Bathing Instructions   You can play a key role in reducing the risk of infection after surgery. Your skin needs to be as free of germs as possible. You can reduce the number of germs on your skin by washing with CHG (chlorhexidine  gluconate) soap before surgery. CHG is an antiseptic soap that kills germs and continues to kill germs even after washing.   DO NOT use if you have an allergy to chlorhexidine /CHG or antibacterial soaps. If your skin becomes reddened or irritated, stop using the CHG and notify one of our RNs at [phone].   Please shower with the CHG soap starting 4 days before surgery using the following schedule:     Please keep in mind the following:  DO NOT shave, including legs and underarms, starting the day of your first shower.   You may shave your face at any point before/day of surgery.  Place clean sheets on your  bed the day you start using CHG soap. Use a clean washcloth (not used since being washed) for each shower. DO NOT sleep with pets once you start using the CHG.   CHG Shower Instructions:  Wash your face and private area with normal soap. If you choose to wash your hair, wash first with your normal shampoo.  After you use shampoo/soap, rinse your hair and body thoroughly to remove shampoo/soap residue.  Turn the water  OFF and apply  bottle of CHG soap to a CLEAN washcloth.  Apply CHG soap ONLY FROM YOUR NECK DOWN TO YOUR TOES (washing for 3-5 minutes)  DO NOT use CHG soap on face, private areas, open wounds, or sores.  Pay special attention to the area where your surgery is being performed.  If you are having back surgery, having someone  wash your back for you may be helpful. Wait 2 minutes after CHG soap is applied, then you may rinse off the CHG soap.  Pat dry with a clean towel  Put on clean clothes/pajamas   If you choose to wear lotion, please use ONLY the CHG-compatible lotions that are listed below.  Additional instructions for the day of surgery:  If you choose, you may shower the morning of surgery with an antibacterial soap.  DO NOT APPLY any lotions, deodorants, cologne, or perfumes.   Do not bring valuables to the hospital. [facility] is not responsible for any belongings/valuables. Do not wear nail polish, gel polish, artificial nails, or any other type of covering on natural nails (fingers and toes) Do not wear jewelry or makeup Put on clean/comfortable clothes.  Please brush your teeth.  Ask your nurse before applying any prescription medications to the skin.     CHG Compatible Lotions   Aveeno Moisturizing lotion  Cetaphil Moisturizing Cream  Cetaphil Moisturizing Lotion  Clairol Herbal Essence Moisturizing Lotion, Dry Skin  Clairol Herbal Essence Moisturizing Lotion, Extra Dry Skin  Clairol Herbal Essence Moisturizing Lotion, Normal Skin  Curel Age Defying  Therapeutic Moisturizing Lotion with Alpha Hydroxy  Curel Extreme Care Body Lotion  Curel Soothing Hands Moisturizing Hand Lotion  Curel Therapeutic Moisturizing Cream, Fragrance-Free  Curel Therapeutic Moisturizing Lotion, Fragrance-Free  Curel Therapeutic Moisturizing Lotion, Original Formula  Eucerin Daily Replenishing Lotion  Eucerin Dry Skin Therapy Plus Alpha Hydroxy Crme  Eucerin Dry Skin Therapy Plus Alpha Hydroxy Lotion  Eucerin Original Crme  Eucerin Original Lotion  Eucerin Plus Crme Eucerin Plus Lotion  Eucerin TriLipid Replenishing Lotion  Keri Anti-Bacterial Hand Lotion  Keri Deep Conditioning Original Lotion Dry Skin Formula Softly Scented  Keri Deep Conditioning Original Lotion, Fragrance Free Sensitive Skin Formula  Keri Lotion Fast Absorbing Fragrance Free Sensitive Skin Formula  Keri Lotion Fast Absorbing Softly Scented Dry Skin Formula  Keri Original Lotion  Keri Skin Renewal Lotion Keri Silky Smooth Lotion  Keri Silky Smooth Sensitive Skin Lotion  Nivea Body Creamy Conditioning Oil  Nivea Body Extra Enriched Lotion  Nivea Body Original Lotion  Nivea Body Sheer Moisturizing Lotion Nivea Crme  Nivea Skin Firming Lotion  NutraDerm 30 Skin Lotion  NutraDerm Skin Lotion  NutraDerm Therapeutic Skin Cream  NutraDerm Therapeutic Skin Lotion  ProShield Protective Hand Cream  Provon moisturizing lotion  Please read over the following fact sheets that you were given.

## 2023-12-29 NOTE — Progress Notes (Unsigned)
 Subjective:    Darlene Wade is a 68 y.o. female who presents for Preventative Services visit and chronic medical problems/med check visit.    Chief Complaint  Patient presents with   Annual Exam    Fasting cpe, having surgery Thursday. Got cleared from cardiology standpoint    Primary Care Provider Irelynn Schermerhorn, Alm RAMAN, PA-C here  Patient Care Team: Natelie Ostrosky, Alm RAMAN, PA-C as PCP - General (Family Medicine) Cindie Ole DASEN, MD as PCP - Electrophysiology (Cardiology) Nahser, Aleene PARAS, MD (Inactive) as Consulting Physician (Cardiology) Joshua Rush, OD (Optometry) [place], Jon VEAR, [facility] (Pharmacist) [facility]. Dempsey Sensor, ortho [facility] Dr. Victory Brand, GI Dr. Donnice Lima, general surgery   Overall doing fine except she is having surgery this week on her neck with Dr. Beuford.  They gave her instructions on which medicines to hold prior to surgery already   Exercise Current exercise habits: walking   Nutrition/Diet Current diet: in general, a healthy diet    Depression Screen    12/29/2023    2:02 PM  Depression screen PHQ 2/9  Decreased Interest 0  Down, Depressed, Hopeless 0  PHQ - 2 Score 0    Fall Risk Screen    12/29/2023    2:01 PM 02/03/2023    6:48 PM 05/02/2022   10:29 AM 11/13/2021   10:35 AM 04/24/2021   11:09 AM  Fall Risk   Falls in the past year? 0 1 0 1 0  Comment  tripped last week     Number falls in past yr: 0 0 0 0 0  Injury with Fall? 0 0 0 0 0  Risk for fall due to : No Fall Risks Medication side effect No Fall Risks Impaired balance/gait No Fall Risks  Follow up Falls evaluation completed Falls prevention discussed;Falls evaluation completed Falls evaluation completed Falls evaluation completed  Falls evaluation completed      Data saved with a previous flowsheet row definition    Gait Assessment: Normal gait observed yes  Advanced directives Does patient have a Health Care Power of Attorney?  No Does patient have a Living Will? No  Past Medical History:  Diagnosis Date   Asthma 07/20/2018   one puff per day   Atrial fibrillation [facility] [place])    CHF (congestive heart failure) (HCC)    Chronic combined systolic and diastolic heart failure (HCC) 03/03/2018   Echo 07/2018: EF 40-45, diff HK worse in Inf base, normal RVSF   Diabetes mellitus without complication (HCC) 2012   Diabetic eye exam (HCC)    [employer]   Difficult intubation    Dyspnea    Elbow fracture, right 2007   Former smoker    20 pack year history, quit 2010   Hyperlipidemia    Hypertension    Insomnia    Lung nodule    Chest CT 07/2018:  RLL nodule resolved.  3 mm subpleural LUL nodule.  Repeat in 1 year if high risk.    Obesity    PONV (postoperative nausea and vomiting) 2014   1 time   Sleep apnea    uses mouthguard   Wears glasses    reading    Past Surgical History:  Procedure Laterality Date   ABDOMINAL HYSTERECTOMY  1986   ATRIAL FIBRILLATION ABLATION N/A 06/05/2023   Procedure: ATRIAL FIBRILLATION ABLATION;  Surgeon: Cindie Ole DASEN, MD;  Location: [facility] INVASIVE CV LAB;  Service: Cardiovascular;  Laterality: N/A;   BREAST CYST ASPIRATION  2015   BREAST EXCISIONAL BIOPSY Left    BREAST LUMPECTOMY WITH NEEDLE LOCALIZATION Left 10/07/2012   Procedure: BREAST LUMPECTOMY WITH NEEDLE LOCALIZATION;  Surgeon: Donnice POUR. Belinda, MD;  Location: [facility];  Service: General;  Laterality: Left;   BREAST SURGERY     COLONOSCOPY  01/2016   01/2016 with Dr. Legrand, tubular adenoma polpy; 2003 with Dr. Kristie   ELBOW ARTHROPLASTY  2006   rt-fx   FOOT ARTHROTOMY  12/2010   foot fusion, right   FOOT MASS EXCISION  04/2012   left-fusion   HYSTERECTOMY ABDOMINAL WITH SALPINGECTOMY Bilateral 1985   JOINT REPLACEMENT  08/10/2018   Total knee replacement   LEFT ATRIAL APPENDAGE OCCLUSION N/A 06/05/2023   Procedure: LEFT ATRIAL APPENDAGE OCCLUSION;  Surgeon: Cindie Ole DASEN, MD;  Location: [facility]  INVASIVE CV LAB;  Service: Cardiovascular;  Laterality: N/A;   PARTIAL HYSTERECTOMY  age 48   uterine fibroids, still has ovaries   TONSILLECTOMY     TOTAL KNEE ARTHROPLASTY Left 08/10/2018   Procedure: Left Knee Arthroplasty;  Surgeon: Liam Lerner, MD;  Location: [facility];  Service: Orthopedics;  Laterality: Left;   TOTAL KNEE ARTHROPLASTY Right 2022   TRANSESOPHAGEAL ECHOCARDIOGRAM (CATH LAB) N/A 06/05/2023   Procedure: TRANSESOPHAGEAL ECHOCARDIOGRAM;  Surgeon: Cindie Ole DASEN, MD;  Location: [facility] INVASIVE CV LAB;  Service: Cardiovascular;  Laterality: N/A;    Social History   Socioeconomic History   Marital status: Married    Spouse name: Not on file   Number of children: 1   Years of education: Not on file   Highest education level: 12th grade  Occupational History   Occupation: School bus driver  Tobacco Use   Smoking status: Former    Current packs/day: 0.00    Average packs/day: 1 pack/day for 20.0 years (20.0 ttl pk-yrs)    Types: Cigarettes    Start date: 04/11/1989    Quit date: 04/11/2009    Years since quitting: 14.7   Smokeless tobacco: Never  Vaping Use   Vaping status: Never Used  Substance and Sexual Activity   Alcohol use: Not Currently    Alcohol/week: 1.0 standard drink of alcohol    Comment: rare   Drug use: No   Sexual activity: Yes    Birth control/protection: Other-see comments    Comment: Hysterectomy  Other Topics Concern   Not on file  Social History Narrative   Separated from husband (2013). Living with husband.  Walking for exercise 2-3 x per week.  Retired, herbalist in the past.  Has 2 grandchildren, teenagers.   Driving school bus.   [id number]   Social Drivers of Health   Financial Resource Strain: Low Risk  (12/17/2023)   Overall Financial Resource Strain (CARDIA)    Difficulty of Paying Living Expenses: Not hard at all  Food Insecurity: No Food Insecurity (12/17/2023)   Hunger Vital Sign    Worried About Running Out of Food in the Last  Year: Never true    Ran Out of Food in the Last Year: Never true  Transportation Needs: No Transportation Needs (12/17/2023)   PRAPARE - Administrator, Civil Service (Medical): No    Lack of Transportation (Non-Medical): No  Physical Activity: Sufficiently Active (12/17/2023)   Exercise Vital Sign    Days of Exercise per Week: 5 days    Minutes of Exercise per Session: 30 min  Stress: No Stress Concern Present (12/17/2023)   [employer] of Occupational Health - Occupational Stress  Questionnaire    Feeling of Stress: Not at all  Social Connections: Moderately Integrated (12/17/2023)   Social Connection and Isolation Panel    Frequency of Communication with Friends and Family: Three times a week    Frequency of Social Gatherings with Friends and Family: Once a week    Attends Religious Services: More than 4 times per year    Active Member of [employer] or Organizations: No    Attends Engineer, Structural: Not on file    Marital Status: Married  Catering Manager Violence: Not At Risk (06/05/2023)   Humiliation, Afraid, Rape, and Kick questionnaire    Fear of Current or Ex-Partner: No    Emotionally Abused: No    Physically Abused: No    Sexually Abused: No    Family History  Problem Relation Age of Onset   Diabetes Mother    Hypertension Mother    Heart disease Maternal Grandfather    Diabetes Maternal Aunt    Cancer Neg Hx    Breast cancer Neg Hx    Colon cancer Neg Hx    Stomach cancer Neg Hx    Esophageal cancer Neg Hx      Current Outpatient Medications:    atorvastatin  (LIPITOR ) 40 MG tablet, Take 1 tablet (40 mg total) by mouth daily., Disp: 90 tablet, Rfl: 3   Cholecalciferol (VITAMIN D ) 50 MCG (2000 UT) CAPS, Take 1 capsule (2,000 Units total) by mouth daily., Disp: 90 capsule, Rfl: 3   dapagliflozin  propanediol (FARXIGA ) 5 MG TABS tablet, Take 1 tablet (5 mg total) by mouth daily before breakfast., Disp: 90 tablet, Rfl: 3    diphenhydramine -acetaminophen  (TYLENOL  PM) 25-500 MG TABS tablet, Take 1 tablet by mouth at bedtime as needed., Disp: , Rfl:    gabapentin  (NEURONTIN ) 300 MG capsule, Take 300 mg by mouth daily as needed (pain)., Disp: , Rfl:    Hydrocortisone Acetate (VAGISIL EX), Apply 1 Application topically daily as needed (yeast infection)., Disp: , Rfl:    metoprolol  succinate (TOPROL  XL) 25 MG 24 hr tablet, Take 1 tablet (25 mg total) by mouth daily., Disp: 90 tablet, Rfl: 3   sacubitril -valsartan  (ENTRESTO ) 24-26 MG, Take 1 tablet by mouth 2 (two) times daily., Disp: 180 tablet, Rfl: 1   sitaGLIPtin -metformin  (JANUMET ) 50-500 MG tablet, Take 1 tablet by mouth daily with breakfast., Disp: 90 tablet, Rfl: 3   Accu-Chek Softclix Lancets lancets, Test 1-2 times daily, Disp: 100 each, Rfl: 2   aspirin  EC 81 MG tablet, Take 81 mg by mouth daily. Swallow whole. (Patient not taking: Reported on 12/29/2023), Disp: , Rfl:    Blood Glucose Monitoring Suppl (ACCU-CHEK GUIDE) w/Device KIT, Use to check with test strips, Disp: 1 kit, Rfl: 0   glucose blood (ACCU-CHEK GUIDE) test strip, Test 1-2 times daily, Disp: 100 each, Rfl: 2  Allergies  Allergen Reactions   Jardiance  [Empagliflozin ]     Yeast infections, tolerates farxiga  though   Naproxen Nausea Only    History reviewed: allergies, current medications, past family history, past medical history, past social history, past surgical history and problem list   Objective:    Biometrics BP 124/80   Pulse 72   Ht 5' 2 (1.575 m)   Wt 206 lb 3.2 oz (93.5 kg)   BMI 37.71 kg/m   Gen: wdwn, nad Skin:  unremarkable HEENT: normocephalic, sclerae anicteric, TMs pearly, nares patent, no discharge or erythema, pharynx normal Oral cavity: MMM, no lesions, teeth in good repair Neck: supple, no lymphadenopathy, no  thyromegaly, no masses, no bruits Heart: RRR, normal S1, S2, no murmurs Lungs: CTA bilaterally, no wheezes, rhonchi, or rales Abdomen: +bs, soft, non  tender, non distended, no masses, no hepatomegaly, no splenomegaly Musculoskeletal: surgical scars dorsal bilat feet, bilat knee surgical scars, otherwise MSK nontender, no swelling, no obvious deformity Extremities: no edema, no cyanosis, no clubbing Pulses: 2+ symmetric, upper and lower extremities, normal cap refill Neurological: alert, oriented x 3, CN2-12 intact, strength normal upper extremities and lower extremities, sensation normal throughout, DTRs 2+ throughout, no cerebellar signs, gait normal Psychiatric: normal affect, behavior normal, pleasant  Breast/Gu - declined/ deferred    Assessment:   Encounter Diagnoses  Name Primary?   Encounter for health maintenance examination in adult Yes   Essential hypertension, benign    Aortic atherosclerosis    Type 2 diabetes mellitus with hyperglycemia, without long-term current use of insulin  (HCC)    Presence of Watchman left atrial appendage closure device    Postmenopausal estrogen deficiency    Paroxysmal atrial fibrillation (HCC)    Hyperlipidemia associated with type 2 diabetes mellitus (HCC)    Vitamin D  deficiency    Morbid obesity (HCC)       Plan:    This visit was a preventative care visit, also known as wellness visit or routine physical.   Topics typically include healthy lifestyle, diet, exercise, preventative care, vaccinations, sick and well care, proper use of emergency dept and after hours care, as well as other concerns.     Recommendations: Continue to return yearly for your annual wellness and preventative care visits.  This gives us  a chance to discuss healthy lifestyle, exercise, vaccinations, review your chart record, and perform screenings where appropriate.  I recommend you see your eye doctor yearly for routine vision care.  I recommend you see your dentist yearly for routine dental care including hygiene visits twice yearly.   Vaccination recommendations were reviewed Immunization History   Administered Date(s) Administered   Fluad Quad(high Dose 65+) 11/16/2018, 11/17/2019, 11/28/2020, 11/13/2021   Influenza,inj,Quad PF,6+ Mos 09/28/2013, 09/21/2014, 12/19/2015, 12/27/2016, 11/13/2017   Influenza-Unspecified 10/27/2022   PFIZER(Purple Top)SARS-COV-2 Vaccination 03/18/2019, 04/08/2019, 11/13/2019   Pfizer Covid-19 Vaccine Bivalent Booster 72yrs & up 11/28/2020   Pfizer(Comirnaty)Fall Seasonal Vaccine 12 years and older 10/27/2022   Pneumococcal Conjugate-13 03/03/2018   Pneumococcal Polysaccharide-23 11/13/2011, 06/13/2020   Tdap 11/13/2011   Vaccine recommendations include Shingrix, yearly flu shot, tetanus and pneumococcal.  Since she is having surgery in a couple days she is going to hold off on vaccines until after surgery   Screening for cancer: Colon cancer screening: I reviewed your colonoscopy from December 2024 with Dr. Victory Brand.  Repeat colonoscopy in 10 years  Breast cancer screening: You should perform a self breast exam monthly.   We reviewed recommendations for regular mammograms and breast cancer screening. Mammogram scheduled for this week  She is status post hysterectomy  Skin cancer screening: Check your skin regularly for new changes, growing lesions, or other lesions of concern Come in for evaluation if you have skin lesions of concern.  Lung cancer screening: If you have a greater than 20 pack year history of tobacco use, then you may qualify for lung cancer screening with a chest CT scan.   Please call your insurance company to inquire about coverage for this test.  We currently don't have screenings for other cancers besides breast, cervical, colon, and lung cancers.  If you have a strong family history of cancer or have other cancer screening  concerns, please let me know.    Bone health: Get at least 150 minutes of aerobic exercise weekly Get weight bearing exercise at least once weekly Bone density test:  A bone density test is an  imaging test that uses a type of X-ray to measure the amount of calcium  and other minerals in your bones. The test may be used to diagnose or screen you for a condition that causes weak or thin bones (osteoporosis), predict your risk for a broken bone (fracture), or determine how well your osteoporosis treatment is working. The bone density test is recommended for females 65 and older, or females or males <65 if certain risk factors such as thyroid  disease, long term use of steroids such as for asthma or rheumatological issues, vitamin D  deficiency, estrogen deficiency, family history of osteoporosis, self or family history of fragility fracture in first degree relative.  Bone Density test normal 2023.   Heart health: Get at least 150 minutes of aerobic exercise weekly Limit alcohol It is important to maintain a healthy blood pressure and healthy cholesterol numbers  Heart disease screening: Screening for heart disease includes screening for blood pressure, fasting lipids, glucose/diabetes screening, BMI height to weight ratio, reviewed of smoking status, physical activity, and diet.    Goals include blood pressure 120/80 or less, maintaining a healthy lipid/cholesterol profile, preventing diabetes or keeping diabetes numbers under good control, not smoking or using tobacco products, exercising most days per week or at least 150 minutes per week of exercise, and eating healthy variety of fruits and vegetables, healthy oils, and avoiding unhealthy food choices like fried food, fast food, high sugar and high cholesterol foods.    Continue routine follow up with cardiology.     Medical care options: I recommend you continue to seek care here first for routine care.  We try really hard to have available appointments Monday through Friday daytime hours for sick visits, acute visits, and physicals.  Urgent care should be used for after hours and weekends for significant issues that cannot wait till  the next day.  The emergency department should be used for significant potentially life-threatening emergencies.  The emergency department is expensive, can often have long wait times for less significant concerns, so try to utilize primary care, urgent care, or telemedicine when possible to avoid unnecessary trips to the emergency department.  Virtual visits and telemedicine have been introduced since the pandemic started in 2020, and can be convenient ways to receive medical care.  We offer virtual appointments as well to assist you in a variety of options to seek medical care.   Advanced Directives: I recommend you consider completing a Health Care Power of Attorney and Living Will.   These documents respect your wishes and help alleviate burdens on your loved ones if you were to become terminally ill or be in a position to need those documents enforced.    You can complete Advanced Directives yourself, have them notarized, then have copies made for our office, for you and for anybody you feel should have them in safe keeping.  Or, you can have an attorney prepare these documents.   If you haven't updated your Last Will and Testament in a while, it may be worthwhile having an attorney prepare these documents together and save on some costs.      Specific Issues: She is currently holding Janumet , Farxiga , aspirin  for surgery this week  On gabapentin  300 mg daily as needed for chronic pain per specialist.  Hx/o afib -status post ablation and Watchman device, very few afib episodes per cardiology.   Doing well on current regimen.  Diabetes-compliant with Farxiga  5 mg daily, Janumet  50/500 milligram daily, continue glucose testing, yearly eye doctor visit, daily foot checks  Hyperlipidemia-Continue atorvastatin  40 mg daily, aspirin  81 mg daily  Hypertension, history of heart failure-continue current medications, Entresto  24/26 mg twice daily, Toprol -XL 25 mg daily, Farxiga   Vitamin D   deficiency-  Currently on 2000 units daily supplement  Coronary artery disease, aortic atherosclerosis-continue cholesterol medicine and aspirin  daily  BMI greater than 37, morbid obesity associated with diabetes -continue efforts to lose weight  ABI somewhat abnormal in 2022.  Consider updated screening next year  Jacalyn was seen today for annual exam.  Diagnoses and all orders for this visit:  Encounter for health maintenance examination in adult  Essential hypertension, benign  Aortic atherosclerosis  Type 2 diabetes mellitus with hyperglycemia, without long-term current use of insulin  (HCC)  Presence of Watchman left atrial appendage closure device  Postmenopausal estrogen deficiency  Paroxysmal atrial fibrillation (HCC)  Hyperlipidemia associated with type 2 diabetes mellitus (HCC)  Vitamin D  deficiency  Morbid obesity (HCC)     F/u 6 months for med check

## 2023-12-29 NOTE — Progress Notes (Addendum)
 PCP - Alm Gent, PA-C  Cardiologist - Dr. Aleene Passe- pt has not been established with new cardiologist yet. She has a tele preop appt scheduled today 11/17  PPM/ICD - denies   Chest x-ray - 09/18/23 EKG - 09/18/23 Stress Test - denies ECHO - 06/05/23 Cardiac Cath - denies  Sleep Study - OSA+ CPAP - denies, uses mouthguard  Fasting Blood Sugar - 90-110 Checks Blood Sugar once a day  Last dose Farxiga  11/17 AM  Last dose of GLP1 agonist-  n/a   Blood Thinner Instructions: n/a Aspirin  Instructions: f/u with surgeon  ERAS Protcol - clears until 0730 PRE-SURGERY G2- given  COVID TEST- n/a   Anesthesia review: yes, cardiac hx, preop clearance call scheduled 11/17. Difficult airway  Patient denies shortness of breath, fever, cough and chest pain at PAT appointment   All instructions explained to the patient, with a verbal understanding of the material. Patient agrees to go over the instructions while at home for a better understanding. The opportunity to ask questions was provided.

## 2023-12-30 ENCOUNTER — Encounter (HOSPITAL_COMMUNITY): Payer: Self-pay

## 2023-12-30 ENCOUNTER — Ambulatory Visit
Admission: RE | Admit: 2023-12-30 | Discharge: 2023-12-30 | Disposition: A | Discharge status: Home / Self Care | Source: Ambulatory Visit | Attending: Medical | Admitting: Medical

## 2023-12-30 DIAGNOSIS — Z1231 Encounter for screening mammogram for malignant neoplasm of breast: Secondary | ICD-10-CM

## 2023-12-30 MED ORDER — VITAMIN D 50 MCG (2000 UT) PO CAPS: 1.0000 | ORAL_CAPSULE | Freq: Every day | ORAL | 3 refills | Status: AC

## 2023-12-30 NOTE — Progress Notes (Signed)
 Anesthesia Chart Review:  Case: [id number] Date/Time: 01/01/24 1017   Procedure: ANTERIOR CERVICAL DECOMPRESSION/DISCECTOMY FUSION 2 LEVELS - ANTERIOR CERVICAL DECOMPRESSION FUSION CERVICAL 3- CERVICAL 4, CERVICAL 4- CERVICAL 5, WITH INSTRUMENTATION AND ALLOGRAFT   Anesthesia type: General   Pre-op diagnosis: CERVICAL RADICULOPATHY   [facility] 05 / [facility]   Surgeons: Beuford Anes, MD       DISCUSSION: Patient is a 68 year old female scheduled for the above procedure.  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 BMI is consistent with obesity  In regards to DIFFICULT AIRWAY. Per 06/05/2023 intubation record: Induction Type: IV induction Ventilation: Mask ventilation without difficulty Laryngoscope Size: Mac, Miller, 3, 4 and Glidescope Grade View: Grade III Tube type: Oral Tube size: 7.5 mm Number of attempts: 1 Airway Equipment and Method: Stylet and Oral airway Placement Confirmation: ETT inserted through vocal cords under direct vision, positive ETCO2 and breath sounds checked- equal and bilateral Secured at: 22 cm Tube secured with: Tape Dental Injury: Teeth and Oropharynx as per pre-operative assessment  Difficulty Due To: Difficulty was unanticipated, Difficult Airway- due to anterior larynx and Difficult Airway- due to reduced neck mobility Future Recommendations: Recommend- induction with short-acting agent, and alternative techniques readily available Comments: Initial attempt by [name], CRNA with Mac 3, no adequate view. Second attempt by [name], MD with miller 2, g3v, attempted ETT passage but immediately recognized as  esophageal and removed. Glidescope utilized with G2av mostly due to poor neck extension. Easy mask ventilation throughout attempts. Recommend glidescope intubation in future.     She had preoperative telephonic cardiology evaluation on 12/29/2023 by Emelia Hazy, NP. He wrote, Given past medical history and time since last visit, based on ACC/AHA guidelines, Darlene Wade would be at acceptable risk for the planned procedure without further cardiovascular testing.    Her RCRI is moderate risk, 6.6% risk of major cardiac event.  She is able to complete greater than 4 METS of physical activity.... Regarding ASA therapy, we recommend continuation of ASA throughout the perioperative period. However, if the surgeon feels that cessation of ASA is required in the perioperative period, it may be stopped 5-7 days prior to surgery with a plan to resume it as soon as felt to be feasible from a surgical standpoint in the post-operative period.  A1c 6.8%. Advised at PAT to hold Farxiga  for 3 days prior to surgery.  Per 05/07/2023 sleep medicine notation from 05/07/2023, Her sleep study significant for mild sleep apnea with an AHI of6.1 (4.4% hypopneas) and no significant hypoxemia. Due to her comorbidity of atrial fibrillation I have recommended that she pursue treatment. With shared decision making she is decided to pursue an oral appliance...  Anesthesia team to evaluate on the day of surgery.   VS: BP 123/70   Pulse 75   Temp 36.8 C   Resp 18   Ht 5' 2 (1.575 m)   Wt 93.3 kg   SpO2 98%   BMI 37.60 kg/m    PROVIDERS: Tysinger, Alm RAMAN, PA-C is PCP  Cindie Smalls, MD is EP cardiologist Nahser, Aleene, MD was primary cardiologist, recently retired. Scheduled to see Floretta Mallard, MD on 02/11/2024.  Harl Savant, MD is sleep medicine  LABS: Labs reviewed: Acceptable for surgery. A1c  6.8% on 12/17/2023.  (all labs ordered are listed, but only abnormal results are displayed)  Labs  Reviewed  GLUCOSE, CAPILLARY - Abnormal; Notable for the following components:      Result Value   Glucose-Capillary 130 (*)    All other components within normal limits  BASIC METABOLIC PANEL WITH GFR - Abnormal; Notable for the following components:   Glucose, Bld 116 (*)    All other components within normal limits  CBC - Abnormal; Notable for the following components:   Hemoglobin 11.9 (*)    All other components within normal limits  SURGICAL PCR SCREEN    IMAGES: CXR 09/18/2023: FINDINGS: No focal consolidation, pleural effusion, pneumothorax. The cardiac silhouette is within normal limits. Atherosclerotic calcification of the aorta. Degenerative changes of spine. No acute osseous pathology. IMPRESSION: No active cardiopulmonary disease.   MRI C-spine 07/15/2022 (Canopy/PACS): IMPRESSION: 1. Multilevel cervical spondylosis, most pronounced at the C3-4 and C4-5 levels where there is severe left and moderate-severe right foraminal stenosis at C3-4 and moderate-to-severe bilateral foraminal stenosis at C4-5. 2. No significant canal stenosis at any level.    EKG: 09/18/2023: Sinus tachycardia at 104 bpm Low voltage, precordial leads Borderline repolarization abnormality Confirmed by Zackowski, Scott [phone]) on 09/18/2023 2:04:31 PM   CV: CT Cardiac Morph (Post-Watchman) 08/06/2023: IMPRESSION: 1. Successful left atrial appendage occlusion with complete seal. 2. Absence of peridevice leak. 3. Absence of device-related thrombus. 4. Stable device position without evidence of migration or embolization.    TEE (Peri-Watchman procedure) 06/05/2023: IMPRESSIONS   1. Wind sock appendage with no thrombus. Well placed 31 mm Watchman FLX  device with no large shoulder, negative tugg test, no leak by color flow  and average compression 16%.   2. Left ventricular ejection fraction, by estimation, is 65 to 70%. The  left ventricle has normal function.   3. Right ventricular  systolic function is normal. The right ventricular  size is normal.   4. No left atrial/left atrial appendage thrombus was detected.   5. The mitral valve is normal in structure. Trivial mitral valve  regurgitation.   6. The aortic valve is tricuspid. Aortic valve regurgitation is not  visualized. No aortic stenosis is present.   7. 3D performed of the LAA and demonstrates Extensive 3D imaging  performed to measure appendage and size Watchman device.   8. 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    CT Coronary Calcium /CT Cardiac Morph (Pre-Watchman) 05/27/2023: Coronary Calcium  Score: Left main: 0 Left anterior descending artery: 46 Left circumflex artery: 1 Right coronary artery: 0 Total: 46 Percentile: 71st for age, sex, and race matched control. IMPRESSION: 1. The left atrial appendage is patent. 2. Recommended Watchman FLX Pro device size: 35 mm. 3. Increase in calcium  score and percentile from 2020 study. 4. Normal variant pulmonary vein anatomy is noted- this was not well characterized on 2020 study.    TTE 11/11/2022: IMPRESSIONS   1. Left ventricular ejection fraction, by estimation, is 40%. The left  ventricle has mild to moderately decreased function. The left ventricle  demonstrates global hypokinesis. Left ventricular diastolic parameters are  consistent with Grade I diastolic  dysfunction (impaired relaxation).   2. Right ventricular systolic function is normal. The right ventricular  size is normal. Tricuspid regurgitation signal is inadequate for assessing  PA pressure.   3. The mitral valve is normal in structure. No evidence of mitral valve  regurgitation. No evidence of mitral stenosis.   4. The aortic valve is tricuspid.  Aortic valve regurgitation is not  visualized. No aortic stenosis is present.   5. The inferior vena cava is normal in size with greater than 50%  respiratory variability, suggesting right atrial pressure of 3 mmHg.    Long  term Patch monitor 10/03/2022 - 10/10/2022: Patient had a min HR of 65 bpm, max HR of 138 bpm, and avg HR of 90 bpm.  Predominant underlying rhythm was Sinus Rhythm.  2.2% supraventricular ectopy <1% ventricular ectopy Triggered episodes and heart racing associated with both sinus rhythm and sinus tachycardia   Past Medical History:  Diagnosis Date   Asthma 07/20/2018   one puff per day   Atrial fibrillation (HCC)    CHF (congestive heart failure) (HCC)    Chronic combined systolic and diastolic heart failure (HCC) 03/03/2018   Echo 07/2018: EF 40-45, diff HK worse in Inf base, normal RVSF   Diabetes mellitus without complication (HCC) 2012   Diabetic eye exam (HCC)    [employer]   Difficult intubation    Dyspnea    Elbow fracture, right 2007   Former smoker    20 pack year history, quit 2010   Hyperlipidemia    Hypertension    Insomnia    Lung nodule    Chest CT 07/2018:  RLL nodule resolved.  3 mm subpleural LUL nodule.  Repeat in 1 year if high risk.    Obesity    PONV (postoperative nausea and vomiting) 2014   1 time   Sleep apnea    uses mouthguard   Wears glasses    reading    Past Surgical History:  Procedure Laterality Date   ABDOMINAL HYSTERECTOMY  1986   ATRIAL FIBRILLATION ABLATION N/A 06/05/2023   Procedure: ATRIAL FIBRILLATION ABLATION;  Surgeon: Cindie Ole DASEN, MD;  Location: [facility];  Service: Cardiovascular;  Laterality: N/A;   BREAST CYST ASPIRATION  2015   BREAST EXCISIONAL BIOPSY Left    BREAST LUMPECTOMY WITH NEEDLE LOCALIZATION Left 10/07/2012   Procedure: BREAST LUMPECTOMY WITH NEEDLE LOCALIZATION;  Surgeon: Donnice POUR. Belinda, MD;  Location: [facility];  Service: General;  Laterality: Left;   BREAST SURGERY     COLONOSCOPY  01/2016   01/2016 with Dr. Legrand, tubular adenoma polpy; 2003 with Dr. Kristie   ELBOW ARTHROPLASTY  2006   rt-fx   FOOT ARTHROTOMY  12/2010   foot fusion, right   FOOT MASS EXCISION  04/2012    left-fusion   HYSTERECTOMY ABDOMINAL WITH SALPINGECTOMY Bilateral 1985   JOINT REPLACEMENT  08/10/2018   Total knee replacement   LEFT ATRIAL APPENDAGE OCCLUSION N/A 06/05/2023   Procedure: LEFT ATRIAL APPENDAGE OCCLUSION;  Surgeon: Cindie Ole DASEN, MD;  Location: [facility];  Service: Cardiovascular;  Laterality: N/A;   PARTIAL HYSTERECTOMY  age 58   uterine fibroids, still has ovaries   TONSILLECTOMY     TOTAL KNEE ARTHROPLASTY Left 08/10/2018   Procedure: Left Knee Arthroplasty;  Surgeon: Liam Lerner, MD;  Location: [facility];  Service: Orthopedics;  Laterality: Left;   TOTAL KNEE ARTHROPLASTY Right 2022   TRANSESOPHAGEAL ECHOCARDIOGRAM (CATH LAB) N/A 06/05/2023   Procedure: TRANSESOPHAGEAL ECHOCARDIOGRAM;  Surgeon: Cindie Ole DASEN, MD;  Location: [facility] INVASIVE CV LAB;  Service: Cardiovascular;  Laterality: N/A;    MEDICATIONS:  Accu-Chek Softclix Lancets lancets   aspirin  EC 81 MG tablet   atorvastatin  (LIPITOR ) 40 MG tablet   Blood Glucose Monitoring Suppl (ACCU-CHEK GUIDE) w/Device KIT   Cholecalciferol (VITAMIN D ) 50 MCG (  2000 UT) CAPS   dapagliflozin  propanediol (FARXIGA ) 5 MG TABS tablet   diphenhydramine -acetaminophen  (TYLENOL  PM) 25-500 MG TABS tablet   gabapentin  (NEURONTIN ) 300 MG capsule   glucose blood (ACCU-CHEK GUIDE) test strip   Hydrocortisone Acetate (VAGISIL EX)   metoprolol  succinate (TOPROL  XL) 25 MG 24 hr tablet   sacubitril -valsartan  (ENTRESTO ) 24-26 MG   sitaGLIPtin -metformin  (JANUMET ) 50-500 MG tablet   No current facility-administered medications for this encounter.    Isaiah Ruder, PA-C Surgical Short Stay/Anesthesiology [facility] Phone [phone] [facility] Phone [phone] 12/30/2023 4:49 PM

## 2023-12-30 NOTE — Anesthesia Preprocedure Evaluation (Signed)
 Anesthesia Evaluation  Patient identified by MRN, date of birth, ID band Patient awake    Reviewed: Allergy & Precautions, H&P , NPO status , Patient's Chart, lab work & pertinent test results  History of Anesthesia Complications (+) PONV, DIFFICULT AIRWAY and history of anesthetic complications  Airway Mallampati: II   Neck ROM: full    Dental   Pulmonary shortness of breath, asthma , sleep apnea , former smoker   breath sounds clear to auscultation       Cardiovascular hypertension, + CAD and +CHF   Rhythm:regular Rate:Normal     Neuro/Psych    GI/Hepatic   Endo/Other  diabetes, Type 2    Renal/GU      Musculoskeletal  (+) Arthritis ,    Abdominal   Peds  Hematology   Anesthesia Other Findings   Reproductive/Obstetrics                              Anesthesia Physical Anesthesia Plan  ASA: 3  Anesthesia Plan: General   Post-op Pain Management:    Induction: Intravenous  PONV Risk Score and Plan: 4 or greater and Ondansetron , Dexamethasone  and Treatment may vary due to age or medical condition  Airway Management Planned: Oral ETT and Video Laryngoscope Planned  Additional Equipment:   Intra-op Plan:   Post-operative Plan: Extubation in OR  Informed Consent: I have reviewed the patients History and Physical, chart, labs and discussed the procedure including the risks, benefits and alternatives for the proposed anesthesia with the patient or authorized representative who has indicated his/her understanding and acceptance.     Dental advisory given  Plan Discussed with: CRNA, Anesthesiologist and Surgeon  Anesthesia Plan Comments: (PAT note written 12/30/2023 by Allison Zelenak, PA-C.  )         Anesthesia Quick Evaluation

## 2024-01-01 ENCOUNTER — Ambulatory Visit (HOSPITAL_COMMUNITY)
Admission: RE | Disposition: A | Discharge status: Home / Self Care | Payer: Self-pay | Source: Ambulatory Visit | Attending: Orthopedic Surgery

## 2024-01-01 ENCOUNTER — Ambulatory Visit (HOSPITAL_COMMUNITY)

## 2024-01-01 ENCOUNTER — Other Ambulatory Visit: Payer: Self-pay

## 2024-01-01 ENCOUNTER — Encounter (HOSPITAL_COMMUNITY): Admitting: Physician Assistant

## 2024-01-01 ENCOUNTER — Encounter (HOSPITAL_COMMUNITY): Payer: Self-pay | Admitting: Orthopedic Surgery

## 2024-01-01 ENCOUNTER — Ambulatory Visit (HOSPITAL_COMMUNITY)
Admission: RE | Admit: 2024-01-01 | Discharge: 2024-01-01 | Disposition: A | Discharge status: Home / Self Care | Source: Ambulatory Visit | Attending: Orthopedic Surgery | Admitting: Orthopedic Surgery

## 2024-01-01 ENCOUNTER — Ambulatory Visit (HOSPITAL_COMMUNITY): Admitting: Physician Assistant

## 2024-01-01 DIAGNOSIS — I2510 Atherosclerotic heart disease of native coronary artery without angina pectoris: Secondary | ICD-10-CM

## 2024-01-01 DIAGNOSIS — M5412 Radiculopathy, cervical region: Secondary | ICD-10-CM | POA: Diagnosis not present

## 2024-01-01 DIAGNOSIS — E119 Type 2 diabetes mellitus without complications: Secondary | ICD-10-CM

## 2024-01-01 DIAGNOSIS — M4802 Spinal stenosis, cervical region: Secondary | ICD-10-CM | POA: Diagnosis present

## 2024-01-01 DIAGNOSIS — Z87891 Personal history of nicotine dependence: Secondary | ICD-10-CM

## 2024-01-01 DIAGNOSIS — I110 Hypertensive heart disease with heart failure: Secondary | ICD-10-CM

## 2024-01-01 DIAGNOSIS — I5042 Chronic combined systolic (congestive) and diastolic (congestive) heart failure: Secondary | ICD-10-CM

## 2024-01-01 HISTORY — PX: ANTERIOR CERVICAL DECOMP/DISCECTOMY FUSION: SHX1161

## 2024-01-01 LAB — GLUCOSE, CAPILLARY
Glucose-Capillary: 135 mg/dL — ABNORMAL HIGH (ref 70–99)
Glucose-Capillary: 140 mg/dL — ABNORMAL HIGH (ref 70–99)

## 2024-01-01 SURGERY — ANTERIOR CERVICAL DECOMPRESSION/DISCECTOMY FUSION 2 LEVELS
Anesthesia: General

## 2024-01-01 MED ORDER — THROMBIN 20000 UNITS EX SOLR
CUTANEOUS | Status: AC
  Filled 2024-01-01: qty 20000

## 2024-01-01 MED ORDER — PHENYLEPHRINE HCL (PRESSORS) 10 MG/ML IV SOLN
INTRAVENOUS | Status: AC
  Filled 2024-01-01: qty 1

## 2024-01-01 MED ORDER — ONDANSETRON HCL 4 MG/2ML IJ SOLN
INTRAMUSCULAR | Status: AC
Start: 2024-01-01 — End: 2024-01-01
  Filled 2024-01-01: qty 2

## 2024-01-01 MED ORDER — FENTANYL CITRATE (PF) 100 MCG/2ML IJ SOLN: 25.0000 ug | INTRAMUSCULAR | Status: DC | PRN

## 2024-01-01 MED ORDER — ROCURONIUM BROMIDE 100 MG/10ML IV SOLN
INTRAVENOUS | Status: DC | PRN
  Administered 2024-01-01: 20 mg via INTRAVENOUS
  Administered 2024-01-01: 50 mg via INTRAVENOUS

## 2024-01-01 MED ORDER — PROPOFOL 10 MG/ML IV BOLUS
INTRAVENOUS | Status: AC
  Filled 2024-01-01: qty 20

## 2024-01-01 MED ORDER — CHLORHEXIDINE GLUCONATE 0.12 % MT SOLN
15.0000 mL | Freq: Once | OROMUCOSAL | Status: AC
  Administered 2024-01-01: 15 mL via OROMUCOSAL
  Filled 2024-01-01: qty 15

## 2024-01-01 MED ORDER — INSULIN ASPART 100 UNIT/ML IJ SOLN: 0.0000 [IU] | INTRAMUSCULAR | Status: DC | PRN

## 2024-01-01 MED ORDER — PROPOFOL 10 MG/ML IV BOLUS
INTRAVENOUS | Status: DC | PRN
  Administered 2024-01-01: 150 mg via INTRAVENOUS
  Administered 2024-01-01: 50 mg via INTRAVENOUS

## 2024-01-01 MED ORDER — OXYCODONE HCL 5 MG/5ML PO SOLN: 5.0000 mg | Freq: Once | ORAL | Status: AC | PRN

## 2024-01-01 MED ORDER — 0.9 % SODIUM CHLORIDE (POUR BTL) OPTIME
TOPICAL | Status: DC | PRN
  Administered 2024-01-01 (×3): 1000 mL

## 2024-01-01 MED ORDER — PHENYLEPHRINE 80 MCG/ML (10ML) SYRINGE FOR IV PUSH (FOR BLOOD PRESSURE SUPPORT)
PREFILLED_SYRINGE | INTRAVENOUS | Status: AC
  Filled 2024-01-01: qty 10

## 2024-01-01 MED ORDER — ORAL CARE MOUTH RINSE: 15.0000 mL | Freq: Once | OROMUCOSAL | Status: AC

## 2024-01-01 MED ORDER — ONDANSETRON HCL 4 MG/2ML IJ SOLN
INTRAMUSCULAR | Status: DC | PRN
  Administered 2024-01-01: 4 mg via INTRAVENOUS

## 2024-01-01 MED ORDER — OXYCODONE HCL 5 MG PO TABS
5.0000 mg | ORAL_TABLET | Freq: Once | ORAL | Status: AC | PRN
  Administered 2024-01-01: 5 mg via ORAL

## 2024-01-01 MED ORDER — AMISULPRIDE (ANTIEMETIC) 5 MG/2ML IV SOLN
INTRAVENOUS | Status: AC
  Filled 2024-01-01: qty 4

## 2024-01-01 MED ORDER — KETAMINE HCL 50 MG/5ML IJ SOSY
PREFILLED_SYRINGE | INTRAMUSCULAR | Status: AC
  Filled 2024-01-01: qty 5

## 2024-01-01 MED ORDER — THROMBIN 20000 UNITS EX SOLR
CUTANEOUS | Status: DC | PRN
  Administered 2024-01-01: 20 mL via TOPICAL

## 2024-01-01 MED ORDER — KETAMINE HCL 50 MG/5ML IJ SOSY
PREFILLED_SYRINGE | INTRAMUSCULAR | Status: DC | PRN
  Administered 2024-01-01 (×2): 20 mg via INTRAVENOUS

## 2024-01-01 MED ORDER — BUPIVACAINE-EPINEPHRINE (PF) 0.25% -1:200000 IJ SOLN
INTRAMUSCULAR | Status: AC
  Filled 2024-01-01: qty 30

## 2024-01-01 MED ORDER — FENTANYL CITRATE (PF) 250 MCG/5ML IJ SOLN
INTRAMUSCULAR | Status: DC | PRN
  Administered 2024-01-01: 50 ug via INTRAVENOUS
  Administered 2024-01-01: 100 ug via INTRAVENOUS
  Administered 2024-01-01: 50 ug via INTRAVENOUS

## 2024-01-01 MED ORDER — MIDAZOLAM HCL 2 MG/2ML IJ SOLN
INTRAMUSCULAR | Status: AC
Start: 2024-01-01 — End: 2024-01-01
  Filled 2024-01-01: qty 2

## 2024-01-01 MED ORDER — HYDROCODONE-ACETAMINOPHEN 5-325 MG PO TABS
1.0000 | ORAL_TABLET | Freq: Four times a day (QID) | ORAL | 0 refills | Status: AC | PRN
Start: 2024-01-01 — End: 2024-01-08

## 2024-01-01 MED ORDER — BUPIVACAINE-EPINEPHRINE 0.25% -1:200000 IJ SOLN
INTRAMUSCULAR | Status: DC | PRN
  Administered 2024-01-01: 8 mL

## 2024-01-01 MED ORDER — ONDANSETRON HCL 4 MG/2ML IJ SOLN: 4.0000 mg | Freq: Four times a day (QID) | INTRAMUSCULAR | Status: DC | PRN

## 2024-01-01 MED ORDER — ROCURONIUM BROMIDE 10 MG/ML (PF) SYRINGE
PREFILLED_SYRINGE | INTRAVENOUS | Status: AC
Start: 2024-01-01 — End: 2024-01-01
  Filled 2024-01-01: qty 10

## 2024-01-01 MED ORDER — MIDAZOLAM HCL (PF) 2 MG/2ML IJ SOLN
INTRAMUSCULAR | Status: DC | PRN
  Administered 2024-01-01: 2 mg via INTRAVENOUS

## 2024-01-01 MED ORDER — OXYCODONE HCL 5 MG PO TABS
ORAL_TABLET | ORAL | Status: AC
  Filled 2024-01-01: qty 1

## 2024-01-01 MED ORDER — LIDOCAINE 2% (20 MG/ML) 5 ML SYRINGE
INTRAMUSCULAR | Status: AC
  Filled 2024-01-01: qty 5

## 2024-01-01 MED ORDER — POVIDONE-IODINE 7.5 % EX SOLN
Freq: Once | CUTANEOUS | Status: DC
  Filled 2024-01-01: qty 118

## 2024-01-01 MED ORDER — PHENYLEPHRINE HCL-NACL 20-0.9 MG/250ML-% IV SOLN
INTRAVENOUS | Status: DC | PRN
  Administered 2024-01-01: 50 ug/min via INTRAVENOUS

## 2024-01-01 MED ORDER — FENTANYL CITRATE (PF) 250 MCG/5ML IJ SOLN
INTRAMUSCULAR | Status: AC
  Filled 2024-01-01: qty 5

## 2024-01-01 MED ORDER — SUGAMMADEX SODIUM 200 MG/2ML IV SOLN
INTRAVENOUS | Status: DC | PRN
  Administered 2024-01-01: 200 mg via INTRAVENOUS

## 2024-01-01 MED ORDER — LIDOCAINE 2% (20 MG/ML) 5 ML SYRINGE
INTRAMUSCULAR | Status: DC | PRN
  Administered 2024-01-01: 100 mg via INTRAVENOUS

## 2024-01-01 MED ORDER — PHENYLEPHRINE 80 MCG/ML (10ML) SYRINGE FOR IV PUSH (FOR BLOOD PRESSURE SUPPORT)
PREFILLED_SYRINGE | INTRAVENOUS | Status: DC | PRN
  Administered 2024-01-01: 80 ug via INTRAVENOUS

## 2024-01-01 MED ORDER — AMISULPRIDE (ANTIEMETIC) 5 MG/2ML IV SOLN
10.0000 mg | Freq: Once | INTRAVENOUS | Status: AC
  Administered 2024-01-01: 10 mg via INTRAVENOUS

## 2024-01-01 MED ORDER — DEXAMETHASONE SOD PHOSPHATE PF 10 MG/ML IJ SOLN
INTRAMUSCULAR | Status: DC | PRN
  Administered 2024-01-01: 10 mg via INTRAVENOUS

## 2024-01-01 MED ORDER — METHOCARBAMOL 500 MG PO TABS
500.0000 mg | ORAL_TABLET | Freq: Four times a day (QID) | ORAL | 2 refills | Status: AC
Start: 2024-01-01 — End: ?

## 2024-01-01 MED ORDER — LACTATED RINGERS IV SOLN: INTRAVENOUS | Status: DC

## 2024-01-01 MED ORDER — SODIUM CHLORIDE 0.9 % IV SOLN: INTRAVENOUS | Status: DC | PRN

## 2024-01-01 MED ORDER — CEFAZOLIN SODIUM-DEXTROSE 2-4 GM/100ML-% IV SOLN
2.0000 g | INTRAVENOUS | Status: AC
  Administered 2024-01-01: 2 g via INTRAVENOUS
  Filled 2024-01-01: qty 100

## 2024-01-01 SURGICAL SUPPLY — 62 items
BAG COUNTER SPONGE SURGICOUNT (BAG) ×1 IMPLANT
BENZOIN TINCTURE PRP APPL 2/3 (GAUZE/BANDAGES/DRESSINGS) ×1 IMPLANT
BIT DRILL NEURO 2X3.1 SFT TUCH (MISCELLANEOUS) ×1 IMPLANT
BIT DRILL SRG 14X2.2XFLT CHK (BIT) IMPLANT
BLADE CLIPPER SURG (BLADE) ×1 IMPLANT
BLADE SURG 15 STRL LF DISP TIS (BLADE) ×1 IMPLANT
CAGE LORDOTIC 6 SM (Cage) IMPLANT
CANISTER SUCTION 3000ML PPV (SUCTIONS) ×1 IMPLANT
COLLAR CERV LO CONTOUR FIRM DE (SOFTGOODS) IMPLANT
COLLAR CERVICAL MIAMI J (SOFTGOODS) IMPLANT
CORD BIPOLAR FORCEPS 12FT (ELECTRODE) ×1 IMPLANT
COVER SURGICAL LIGHT HANDLE (MISCELLANEOUS) ×1 IMPLANT
DEVICE ENDSKLTN IMPLANT SM 7MM (Cage) IMPLANT
DRAPE C-ARM 42X72 X-RAY (DRAPES) ×1 IMPLANT
DRAPE POUCH INSTRU U-SHP 10X18 (DRAPES) ×1 IMPLANT
DRAPE SURG 17X23 STRL (DRAPES) ×4 IMPLANT
DURAPREP 26ML APPLICATOR (WOUND CARE) ×1 IMPLANT
ELECT COATED BLADE 2.86 ST (ELECTRODE) ×1 IMPLANT
ELECTRODE REM PT RTRN 9FT ADLT (ELECTROSURGICAL) ×1 IMPLANT
GAUZE 4X4 16PLY ~~LOC~~+RFID DBL (SPONGE) ×1 IMPLANT
GAUZE SPONGE 4X4 12PLY STRL (GAUZE/BANDAGES/DRESSINGS) ×1 IMPLANT
GLOVE BIO SURGEON STRL SZ 6.5 (GLOVE) ×1 IMPLANT
GLOVE BIO SURGEON STRL SZ8 (GLOVE) ×1 IMPLANT
GLOVE BIOGEL PI IND STRL 7.0 (GLOVE) ×2 IMPLANT
GLOVE BIOGEL PI IND STRL 8 (GLOVE) ×1 IMPLANT
GOWN STRL REUS W/ TWL LRG LVL3 (GOWN DISPOSABLE) ×1 IMPLANT
GOWN STRL REUS W/ TWL XL LVL3 (GOWN DISPOSABLE) ×1 IMPLANT
GRAFT BNE MATRIX VG FRMBL SM 1 (Bone Implant) IMPLANT
IV CATH 14GX2 1/4 (CATHETERS) ×1 IMPLANT
KIT BASIN OR (CUSTOM PROCEDURE TRAY) ×1 IMPLANT
KIT TURNOVER KIT B (KITS) ×1 IMPLANT
MANIFOLD NEPTUNE II (INSTRUMENTS) ×1 IMPLANT
NDL PRECISIONGLIDE 27X1.5 (NEEDLE) ×1 IMPLANT
NDL SPNL 18GX3.5 QUINCKE PK (NEEDLE) ×1 IMPLANT
NEEDLE PRECISIONGLIDE 27X1.5 (NEEDLE) ×1 IMPLANT
NEEDLE SPNL 18GX3.5 QUINCKE PK (NEEDLE) ×1 IMPLANT
PACK ORTHO CERVICAL (CUSTOM PROCEDURE TRAY) ×1 IMPLANT
PAD ARMBOARD POSITIONER FOAM (MISCELLANEOUS) ×2 IMPLANT
PATTIES SURGICAL .5 X.5 (GAUZE/BANDAGES/DRESSINGS) ×1 IMPLANT
PATTIES SURGICAL .5 X1 (DISPOSABLE) IMPLANT
PIN DISTRACTION 14 (PIN) IMPLANT
PLATE SKYLINE TWO LEVEL 28MM (Plate) IMPLANT
POSITIONER HEAD DONUT 9IN (MISCELLANEOUS) ×1 IMPLANT
SCREW SKYLINE VAR OS 14MM (Screw) IMPLANT
SCREW VAR SELF TAP SKYLINE 14M (Screw) IMPLANT
SOLN 0.9% NACL POUR BTL 1000ML (IV SOLUTION) ×1 IMPLANT
SOLN STERILE WATER BTL 1000 ML (IV SOLUTION) ×1 IMPLANT
SPIKE FLUID TRANSFER (MISCELLANEOUS) ×1 IMPLANT
SPONGE INTESTINAL PEANUT (DISPOSABLE) ×1 IMPLANT
SPONGE SURGIFOAM ABS GEL 100 (HEMOSTASIS) ×1 IMPLANT
STRIP CLOSURE SKIN 1/2X4 (GAUZE/BANDAGES/DRESSINGS) ×1 IMPLANT
SURGIFLO W/THROMBIN 8M KIT (HEMOSTASIS) IMPLANT
SUT MNCRL AB 4-0 PS2 18 (SUTURE) ×1 IMPLANT
SUT SILK 4-0 18XBRD TIE 12 (SUTURE) IMPLANT
SUT VIC AB 2-0 CT2 18 VCP726D (SUTURE) ×1 IMPLANT
SYR BULB IRRIG 60ML STRL (SYRINGE) ×1 IMPLANT
SYR CONTROL 10ML LL (SYRINGE) ×3 IMPLANT
TAPE CLOTH 4X10 WHT NS (GAUZE/BANDAGES/DRESSINGS) ×1 IMPLANT
TAPE UMBILICAL 1/8X30 (MISCELLANEOUS) ×2 IMPLANT
TOWEL GREEN STERILE (TOWEL DISPOSABLE) ×1 IMPLANT
TOWEL GREEN STERILE FF (TOWEL DISPOSABLE) ×1 IMPLANT
YANKAUER SUCT BULB TIP NO VENT (SUCTIONS) ×1 IMPLANT

## 2024-01-01 NOTE — Anesthesia Postprocedure Evaluation (Signed)
 Anesthesia Post Note  Patient: Darlene Wade  Procedure(s) Performed: ANTERIOR CERVICAL DECOMPRESSION FUSION CERVICAL THREE- CERVICAL FOUR, CERVICAL FOUR- CERVICAL FIVE, WITH INSTRUMENTATION AND ALLOGRAFT     Patient location during evaluation: PACU Anesthesia Type: General Level of consciousness: awake and alert Pain management: pain level controlled Vital Signs Assessment: post-procedure vital signs reviewed and stable Respiratory status: spontaneous breathing, nonlabored ventilation, respiratory function stable and patient connected to nasal cannula oxygen  Cardiovascular status: blood pressure returned to baseline and stable Postop Assessment: no apparent nausea or vomiting Anesthetic complications: no   No notable events documented.  Last Vitals:  Vitals:   01/01/24 1300 01/01/24 1315  BP: (!) 146/77 (!) 145/78  Pulse: 77 80  Resp: 15 16  Temp:  36.7 C  SpO2: 95% 96%    Last Pain:  Vitals:   01/01/24 1315  TempSrc:   PainSc: 5                  Keyion Knack S

## 2024-01-01 NOTE — Op Note (Signed)
 PATIENT NAME: Darlene Wade  MEDICAL RECORD NO.:   [id number]   DATE OF BIRTH:  [birth year]    DATE OF PROCEDURE:  01/01/2024                                OPERATIVE REPORT     PREOPERATIVE DIAGNOSES: 1.  Left-sided cervical radiculopathy 2.  Spinal stenosis involving C3/4 and C4/5   POSTOPERATIVE DIAGNOSES: 1.  Left-sided cervical radiculopathy 2.  Spinal stenosis involving C3/4 and C4/5   PROCEDURE: 1. Anterior cervical decompression and fusion C3/4, C4/5 2. Placement of anterior instrumentation, C3-C5 3. Insertion of interbody device x 2 (Titan intervertebral spacers). 4. Intraoperative use of fluoroscopy. 5. Use of morselized allograft - ViviGen.   SURGEON:  Oneil Priestly, MD   ASSISTANT:  Ileana Clara, PA-C.   ANESTHESIA:  General endotracheal anesthesia.   COMPLICATIONS:  None.   DISPOSITION:  Stable.   ESTIMATED BLOOD LOSS:  Minimal.   INDICATIONS FOR SURGERY:  Briefly, Darlene Wade is a pleasant 68 y.o. year-old female, who did present to me with progressive left arm pain.  The patient's MRI did reveal the findings noted above, notable for stenosis at C3-4 and C4-5, correlating to the patient's left arm pain.  Given the patient's lack of improvement with conservative treatment measures and MRI findings, we did ultimately discuss proceeding with the procedure noted above.  The patient was fully aware of the risks and limitations of surgery as outlined in my preoperative note.   OPERATIVE DETAILS:  On 01/01/2024, the patient was brought to surgery and general endotracheal anesthesia was administered. The patient was placed supine on the hospital bed. The neck was gently extended.  All bony prominences were meticulously padded.  The neck was prepped and draped in the usual sterile fashion.  At this point, I did make a left-sided transverse incision.  The platysma was incised.  A Smith-Robinson approach was used and the anterior spine was identified. A self-retaining  retractor was placed.  I then subperiosteally exposed the vertebral bodies from C3-C5.  Caspar pins were then placed into the C4 and C5 vertebral bodies and distraction was applied.  A thorough and complete C4/5 intervertebral diskectomy was performed.  The posterior longitudinal ligament was identified and entered using a nerve hook.  I then used #1 followed by #2 Kerrison to perform a thorough and complete intervertebral diskectomy.  The spinal canal was thoroughly decompressed, as was the right and left neuroforamen. The endplates were then prepared and the appropriate-sized intervertebral spacer was then packed with ViviGen and tamped into position in the usual fashion.  The lower Caspar pin was then removed and placed into the C3 vertebral body and once again, distraction was applied across the C3/4 intervertebral space.  I then again performed a thorough and complete diskectomy, thoroughly decompressing the spinal canal and bilateral neuroforamena.  After preparing the endplates, the appropriate-sized intervertebral spacer was packed with ViviGen and tamped into position. The Caspar pins then were removed and bone wax was placed in their place.  The appropriate-sized anterior cervical plate was placed over the anterior spine.  14 mm variable angle screws were placed, 2 in each vertebral body from C3-C5 for a total of 6 vertebral body screws.  The screws were then locked to the plate using the Cam locking mechanism.  I was very pleased with the final fluoroscopic images.  The wound was then irrigated.  The wound was  then explored for any undue bleeding and there was no bleeding noted. The wound was then closed in layers using 2-0 Vicryl, followed by 4-0 Monocryl.  Benzoin and Steri-Strips were applied, followed by sterile dressing.  All instrument counts were correct at the termination of the procedure.   Of note, Ileana Clara, PA-C, was my assistant throughout surgery, and did aid in retraction,  suctioning, placement of the hardware, and closure from start to finish.     Oneil Priestly, MD

## 2024-01-01 NOTE — Transfer of Care (Signed)
 Immediate Anesthesia Transfer of Care Note  Patient: Darlene Wade  Procedure(s) Performed: ANTERIOR CERVICAL DECOMPRESSION FUSION CERVICAL THREE- CERVICAL FOUR, CERVICAL FOUR- CERVICAL FIVE, WITH INSTRUMENTATION AND ALLOGRAFT  Patient Location: PACU  Anesthesia Type:General  Level of Consciousness: awake and drowsy  Airway & Oxygen  Therapy: Patient Spontanous Breathing and Patient connected to face mask oxygen   Post-op Assessment: Report given to RN and Post -op Vital signs reviewed and stable  Post vital signs: Reviewed and stable  Last Vitals:  Vitals Value Taken Time  BP    Temp    Pulse 78 01/01/24 12:40  Resp 17 01/01/24 12:40  SpO2 98 % 01/01/24 12:40  Vitals shown include unfiled device data.  Last Pain:  Vitals:   01/01/24 0830  TempSrc:   PainSc: 3       Patients Stated Pain Goal: 1 (01/01/24 0830)  Complications: No notable events documented.

## 2024-01-01 NOTE — Anesthesia Procedure Notes (Signed)
 Procedure Name: Intubation Date/Time: 01/01/2024 10:40 AM  Performed by: Obadiah Reyes BROCKS, CRNAPre-anesthesia Checklist: Patient identified, Emergency Drugs available, Suction available and Patient being monitored Patient Re-evaluated:Patient Re-evaluated prior to induction Oxygen  Delivery Method: Circle System Utilized Preoxygenation: Pre-oxygenation with 100% oxygen  Induction Type: IV induction Ventilation: Mask ventilation without difficulty Laryngoscope Size: McGrath and 3 Grade View: Grade II Tube type: Oral Number of attempts: 1 Airway Equipment and Method: Stylet and Oral airway Placement Confirmation: ETT inserted through vocal cords under direct vision, positive ETCO2 and breath sounds checked- equal and bilateral Secured at: 20 cm Tube secured with: Tape Dental Injury: Teeth and Oropharynx as per pre-operative assessment

## 2024-01-01 NOTE — H&P (Signed)
 PREOPERATIVE H&P  Chief Complaint: Left arm pain  HPI: Darlene Wade is a 68 y.o. female who presents with ongoing pain in the left arm  MRI reveals spinal stenosis at C3-4 and C4-5, correlating to the patient's left arm pain  Patient has failed multiple forms of conservative care and continues to have pain (see office notes for additional details regarding the patient's full course of treatment)  Past Medical History:  Diagnosis Date   Asthma 07/20/2018   one puff per day   Atrial fibrillation (HCC)    CHF (congestive heart failure) (HCC)    Chronic combined systolic and diastolic heart failure (HCC) 03/03/2018   Echo 07/2018: EF 40-45, diff HK worse in Inf base, normal RVSF   Diabetes mellitus without complication (HCC) 2012   Diabetic eye exam (HCC)    Vision Works   Difficult intubation 06/05/2023   Glidescope for intubation recommended 06/05/2023   Dyspnea    Elbow fracture, right 2007   Former smoker    20 pack year history, quit 2010   Hyperlipidemia    Hypertension    Insomnia    Lung nodule    Chest CT 07/2018:  RLL nodule resolved.  3 mm subpleural LUL nodule.  Repeat in 1 year if high risk.    Obesity    PONV (postoperative nausea and vomiting) 2014   1 time   Sleep apnea    uses mouthguard   Wears glasses    reading   Past Surgical History:  Procedure Laterality Date   ABDOMINAL HYSTERECTOMY  1986   ATRIAL FIBRILLATION ABLATION N/A 06/05/2023   Procedure: ATRIAL FIBRILLATION ABLATION;  Surgeon: Cindie Ole DASEN, MD;  Location: [facility];  Service: Cardiovascular;  Laterality: N/A;   BREAST CYST ASPIRATION  2015   BREAST EXCISIONAL BIOPSY Left    BREAST LUMPECTOMY WITH NEEDLE LOCALIZATION Left 10/07/2012   Procedure: BREAST LUMPECTOMY WITH NEEDLE LOCALIZATION;  Surgeon: Donnice POUR. Belinda, MD;  Location: [facility];  Service: General;  Laterality: Left;   BREAST SURGERY     COLONOSCOPY  01/2016   01/2016 with Dr. Legrand,  tubular adenoma polpy; 2003 with Dr. Kristie   ELBOW ARTHROPLASTY  2006   rt-fx   FOOT ARTHROTOMY  12/2010   foot fusion, right   FOOT MASS EXCISION  04/2012   left-fusion   HYSTERECTOMY ABDOMINAL WITH SALPINGECTOMY Bilateral 1985   JOINT REPLACEMENT  08/10/2018   Total knee replacement   LEFT ATRIAL APPENDAGE OCCLUSION N/A 06/05/2023   Procedure: LEFT ATRIAL APPENDAGE OCCLUSION;  Surgeon: Cindie Ole DASEN, MD;  Location: [facility];  Service: Cardiovascular;  Laterality: N/A;   PARTIAL HYSTERECTOMY  age 86   uterine fibroids, still has ovaries   TONSILLECTOMY     TOTAL KNEE ARTHROPLASTY Left 08/10/2018   Procedure: Left Knee Arthroplasty;  Surgeon: Liam Lerner, MD;  Location: [facility];  Service: Orthopedics;  Laterality: Left;   TOTAL KNEE ARTHROPLASTY Right 2022   TRANSESOPHAGEAL ECHOCARDIOGRAM (CATH LAB) N/A 06/05/2023   Procedure: TRANSESOPHAGEAL ECHOCARDIOGRAM;  Surgeon: Cindie Ole DASEN, MD;  Location: [facility];  Service: Cardiovascular;  Laterality: N/A;   Social History   Socioeconomic History   Marital status: Married    Spouse name: Not on file   Number of children: 1   Years of education: Not on file   Highest education level: 12th grade  Occupational History   Occupation: School bus driver  Tobacco Use  Smoking status: Former    Current packs/day: 0.00    Average packs/day: 1 pack/day for 20.0 years (20.0 ttl pk-yrs)    Types: Cigarettes    Start date: 04/11/1989    Quit date: 04/11/2009    Years since quitting: 14.7   Smokeless tobacco: Never  Vaping Use   Vaping status: Never Used  Substance and Sexual Activity   Alcohol use: Not Currently    Alcohol/week: 1.0 standard drink of alcohol    Comment: rare   Drug use: No   Sexual activity: Yes    Birth control/protection: Other-see comments    Comment: Hysterectomy  Other Topics Concern   Not on file  Social History Narrative   Separated from husband (2013). Living with husband.  Walking  for exercise 2-3 x per week.  Retired, herbalist in the past.  Has 2 grandchildren, teenagers.   Driving school bus.   [id number]   Social Drivers of Health   Financial Resource Strain: Low Risk  (12/17/2023)   Overall Financial Resource Strain (CARDIA)    Difficulty of Paying Living Expenses: Not hard at all  Food Insecurity: No Food Insecurity (12/17/2023)   Hunger Vital Sign    Worried About Running Out of Food in the Last Year: Never true    Ran Out of Food in the Last Year: Never true  Transportation Needs: No Transportation Needs (12/17/2023)   PRAPARE - Administrator, Civil Service (Medical): No    Lack of Transportation (Non-Medical): No  Physical Activity: Sufficiently Active (12/17/2023)   Exercise Vital Sign    Days of Exercise per Week: 5 days    Minutes of Exercise per Session: 30 min  Stress: No Stress Concern Present (12/17/2023)   [employer] of Occupational Health - Occupational Stress Questionnaire    Feeling of Stress: Not at all  Social Connections: Moderately Integrated (12/17/2023)   Social Connection and Isolation Panel    Frequency of Communication with Friends and Family: Three times a week    Frequency of Social Gatherings with Friends and Family: Once a week    Attends Religious Services: More than 4 times per year    Active Member of [employer] or Organizations: No    Attends Engineer, Structural: Not on file    Marital Status: Married   Family History  Problem Relation Age of Onset   Diabetes Mother    Hypertension Mother    Heart disease Maternal Grandfather    Diabetes Maternal Aunt    Cancer Neg Hx    Breast cancer Neg Hx    Colon cancer Neg Hx    Stomach cancer Neg Hx    Esophageal cancer Neg Hx    Allergies  Allergen Reactions   Jardiance  [Empagliflozin ]     Yeast infections, tolerates farxiga  though   Naproxen Nausea Only   Prior to Admission medications   Medication Sig Start Date End Date Taking? Authorizing  Provider  aspirin  EC 81 MG tablet Take 81 mg by mouth daily. Swallow whole. Patient not taking: Reported on 12/29/2023   Yes [provider]  atorvastatin  (LIPITOR ) 40 MG tablet Take 1 tablet (40 mg total) by mouth daily. 12/18/23  Yes Tysinger, Alm RAMAN, PA-C  dapagliflozin  propanediol (FARXIGA ) 5 MG TABS tablet Take 1 tablet (5 mg total) by mouth daily before breakfast. 12/18/23  Yes Tysinger, Alm RAMAN, PA-C  diphenhydramine -acetaminophen  (TYLENOL  PM) 25-500 MG TABS tablet Take 1 tablet by mouth at bedtime as needed.  Yes [provider]  Hydrocortisone Acetate (VAGISIL EX) Apply 1 Application topically daily as needed (yeast infection).   Yes [provider]  metoprolol  succinate (TOPROL  XL) 25 MG 24 hr tablet Take 1 tablet (25 mg total) by mouth daily. 08/22/23  Yes Wyn Jackee VEAR Mickey., NP  sacubitril -valsartan  (ENTRESTO ) 24-26 MG Take 1 tablet by mouth 2 (two) times daily. 10/01/23  Yes Wyn Jackee VEAR Mickey., NP  sitaGLIPtin -metformin  (JANUMET ) 50-500 MG tablet Take 1 tablet by mouth daily with breakfast. 12/18/23  Yes Tysinger, Alm RAMAN, PA-C  Accu-Chek Softclix Lancets lancets Test 1-2 times daily 06/20/22   Tysinger, Alm RAMAN, PA-C  Blood Glucose Monitoring Suppl (ACCU-CHEK GUIDE) w/Device KIT Use to check with test strips 06/20/22   Tysinger, Alm RAMAN, PA-C  Cholecalciferol (VITAMIN D ) 50 MCG (2000 UT) CAPS Take 1 capsule (2,000 Units total) by mouth daily. 12/30/23   Tysinger, Alm RAMAN, PA-C  gabapentin  (NEURONTIN ) 300 MG capsule Take 300 mg by mouth daily as needed (pain). 09/13/22   [provider]  glucose blood (ACCU-CHEK GUIDE) test strip Test 1-2 times daily 06/20/22   Tysinger, Alm RAMAN, PA-C     All other systems have been reviewed and were otherwise negative with the exception of those mentioned in the HPI and as above.  Physical Exam: There were no vitals filed for this visit.  There is no height or weight on file to calculate BMI.  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

## 2024-01-02 ENCOUNTER — Encounter (HOSPITAL_COMMUNITY): Payer: Self-pay | Admitting: Orthopedic Surgery

## 2024-01-02 ENCOUNTER — Ambulatory Visit: Payer: Self-pay | Admitting: Medical

## 2024-01-02 MED FILL — Thrombin For Soln 20000 Unit: CUTANEOUS | Qty: 1 | Status: AC

## 2024-01-20 NOTE — Progress Notes (Signed)
   01/20/2024  Patient ID: Darlene Wade, female   DOB: 1955-04-09, 68 y.o.   MRN: [id number]  Pharmacy Quality Measure Review  This patient is appearing on a report for being at risk of failing the adherence measure for diabetes medications this calendar year.   Medication: Janumet  Last fill date: 01/16/24 for 30 day supply  Insurance report was not up to date. No action needed at this time.   Jon VEAR Lindau, PharmD Clinical Pharmacist [phone]

## 2024-02-10 ENCOUNTER — Ambulatory Visit: Payer: Medicare Other | Admitting: *Deleted

## 2024-02-10 VITALS — Ht 62.0 in | Wt 195.0 lb

## 2024-02-10 DIAGNOSIS — Z780 Asymptomatic menopausal state: Secondary | ICD-10-CM

## 2024-02-10 DIAGNOSIS — Z0000 Encounter for general adult medical examination without abnormal findings: Secondary | ICD-10-CM

## 2024-02-10 NOTE — Assessment & Plan Note (Signed)
 Reestablish with EP***

## 2024-02-10 NOTE — Assessment & Plan Note (Signed)
 Complete echocardiogram***

## 2024-02-10 NOTE — Progress Notes (Signed)
 "  Chief Complaint  Patient presents with   Medicare Wellness     Subjective:   Darlene Wade is a 68 y.o. female who presents for a Medicare Annual Wellness Visit.  No voiced or noted concerns at this time   Visit info / Clinical Intake: Medicare Wellness Visit Type:: Subsequent Annual Wellness Visit Persons participating in visit and providing information:: patient Medicare Wellness Visit Mode:: Video Since this visit was completed virtually, some vitals may be partially provided or unavailable. Missing vitals are due to the limitations of the virtual format.: Unable to obtain vitals - no equipment If Telephone or Video please confirm:: I connected with patient using audio/video enable telemedicine. I verified patient identity with two identifiers, discussed telehealth limitations, and patient agreed to proceed. Patient Location:: home Provider Location:: home Interpreter Needed?: No Pre-visit prep was completed: no AWV questionnaire completed by patient prior to visit?: no Living arrangements:: lives with spouse/significant other Patient's Overall Health Status Rating: good Typical amount of pain: some Does pain affect daily life?: no  Dietary Habits and Nutritional Risks How many meals a day?: 2 Eats fruit and vegetables daily?: (!) no Most meals are obtained by: preparing own meals; eating out Diabetic:: (!) yes Any non-healing wounds?: no How often do you check your BS?: 1 Would you like to be referred to a Nutritionist or for Diabetic Management? : no  Functional Status Activities of Daily Living (to include ambulation/medication): Independent Ambulation: Independent Medication Administration: Independent Home Management (perform basic housework or laundry): Independent Manage your own finances?: yes Primary transportation is: driving Concerns about vision?: no *vision screening is required for WTM* Concerns about hearing?: no  Fall Screening Falls in the past  year?: 0 Number of falls in past year: 0 Was there an injury with Fall?: 0 Fall Risk Category Calculator: 0 Patient Fall Risk Level: Low Fall Risk  Fall Risk Patient at Risk for Falls Due to: No Fall Risks Fall risk Follow up: Falls evaluation completed; Education provided; Falls prevention discussed  Home and Transportation Safety: All rugs have non-skid backing?: N/A, no rugs All stairs or steps have railings?: yes Grab bars in the bathtub or shower?: (!) no Have non-skid surface in bathtub or shower?: yes Good home lighting?: yes Regular seat belt use?: yes Hospital stays in the last year:: (!) yes How many hospital stays:: 1  Cognitive Assessment Difficulty concentrating, remembering, or making decisions? : no Will 6CIT or Mini Cog be Completed: yes What year is it?: 0 points What month is it?: 0 points Give patient an address phrase to remember (5 components): Its very sunny outside today in December About what time is it?: 0 points Count backwards from 20 to 1: 0 points Say the months of the year in reverse: 0 points Repeat the address phrase from earlier: 0 points 6 CIT Score: 0 points  Advance Directives (For Healthcare) Does Patient Have a Medical Advance Directive?: No Would patient like information on creating a medical advance directive?: No - Patient declined  Reviewed/Updated  Reviewed/Updated: Reviewed All (Medical, Surgical, Family, Medications, Allergies, Care Teams, Patient Goals); Surgical History; Family History; Medications; Allergies; Care Teams; Patient Goals; Medical History    Allergies (verified) Jardiance  [empagliflozin ] and Naproxen   Current Medications (verified) Outpatient Encounter Medications as of 02/10/2024  Medication Sig   Accu-Chek Softclix Lancets lancets Test 1-2 times daily   aspirin  EC 81 MG tablet Take 81 mg by mouth daily. Swallow whole.   atorvastatin  (LIPITOR ) 40 MG tablet Take  1 tablet (40 mg total) by mouth daily.    Blood Glucose Monitoring Suppl (ACCU-CHEK GUIDE) w/Device KIT Use to check with test strips   Cholecalciferol (VITAMIN D ) 50 MCG (2000 UT) CAPS Take 1 capsule (2,000 Units total) by mouth daily.   dapagliflozin  propanediol (FARXIGA ) 5 MG TABS tablet Take 1 tablet (5 mg total) by mouth daily before breakfast.   diphenhydramine -acetaminophen  (TYLENOL  PM) 25-500 MG TABS tablet Take 1 tablet by mouth at bedtime as needed.   gabapentin  (NEURONTIN ) 300 MG capsule Take 300 mg by mouth daily as needed (pain).   glucose blood (ACCU-CHEK GUIDE) test strip Test 1-2 times daily   Hydrocortisone Acetate (VAGISIL EX) Apply 1 Application topically daily as needed (yeast infection).   methocarbamol  (ROBAXIN ) 500 MG tablet Take 1-2 tablets (500-1,000 mg total) by mouth 4 (four) times daily.   metoprolol  succinate (TOPROL  XL) 25 MG 24 hr tablet Take 1 tablet (25 mg total) by mouth daily.   sacubitril -valsartan  (ENTRESTO ) 24-26 MG Take 1 tablet by mouth 2 (two) times daily.   sitaGLIPtin -metformin  (JANUMET ) 50-500 MG tablet Take 1 tablet by mouth daily with breakfast.   No facility-administered encounter medications on file as of 02/10/2024.    History: Past Medical History:  Diagnosis Date   Asthma 07/20/2018   one puff per day   Atrial fibrillation [facility])    CHF (congestive heart failure) (HCC)    Chronic combined systolic and diastolic heart failure (HCC) 03/03/2018   Echo 07/2018: EF 40-45, diff HK worse in Inf base, normal RVSF   Diabetes mellitus without complication (HCC) 2012   Diabetic eye exam (HCC)    Vision Works   Difficult intubation 06/05/2023   Glidescope for intubation recommended 06/05/2023   Dyspnea    Elbow fracture, right 2007   Former smoker    20 pack year history, quit 2010   Hyperlipidemia    Hypertension    Insomnia    Lung nodule    Chest CT 07/2018:  RLL nodule resolved.  3 mm subpleural LUL nodule.  Repeat in 1 year if high risk.    Obesity    PONV (postoperative nausea  and vomiting) 2014   1 time   Sleep apnea    uses mouthguard   Wears glasses    reading   Past Surgical History:  Procedure Laterality Date   ABDOMINAL HYSTERECTOMY  1986   ANTERIOR CERVICAL DECOMP/DISCECTOMY FUSION N/A 01/01/2024   Procedure: ANTERIOR CERVICAL DECOMPRESSION FUSION CERVICAL THREE- CERVICAL FOUR, CERVICAL FOUR- CERVICAL FIVE, WITH INSTRUMENTATION AND ALLOGRAFT;  Surgeon: Beuford Anes, MD;  Location: [facility];  Service: Orthopedics;  Laterality: N/A;  ANTERIOR CERVICAL DECOMPRESSION FUSION CERVICAL 3- CERVICAL 4, CERVICAL 4- CERVICAL 5, WITH INSTRUMENTATION AND ALLOGRAFT   ATRIAL FIBRILLATION ABLATION N/A 06/05/2023   Procedure: ATRIAL FIBRILLATION ABLATION;  Surgeon: Cindie Ole DASEN, MD;  Location: [facility];  Service: Cardiovascular;  Laterality: N/A;   BREAST CYST ASPIRATION  2015   BREAST EXCISIONAL BIOPSY Left    BREAST LUMPECTOMY WITH NEEDLE LOCALIZATION Left 10/07/2012   Procedure: BREAST LUMPECTOMY WITH NEEDLE LOCALIZATION;  Surgeon: Donnice POUR. Belinda, MD;  Location: [facility];  Service: General;  Laterality: Left;   BREAST SURGERY     COLONOSCOPY  01/2016   01/2016 with Dr. Legrand, tubular adenoma polpy; 2003 with Dr. Kristie   ELBOW ARTHROPLASTY  2006   rt-fx   FOOT ARTHROTOMY  12/2010   foot fusion, right   FOOT MASS EXCISION  04/2012  left-fusion   HYSTERECTOMY ABDOMINAL WITH SALPINGECTOMY Bilateral 1985   JOINT REPLACEMENT  08/10/2018   Total knee replacement   LEFT ATRIAL APPENDAGE OCCLUSION N/A 06/05/2023   Procedure: LEFT ATRIAL APPENDAGE OCCLUSION;  Surgeon: Cindie Ole DASEN, MD;  Location: [facility];  Service: Cardiovascular;  Laterality: N/A;   PARTIAL HYSTERECTOMY  age 89   uterine fibroids, still has ovaries   TONSILLECTOMY     TOTAL KNEE ARTHROPLASTY Left 08/10/2018   Procedure: Left Knee Arthroplasty;  Surgeon: Liam Lerner, MD;  Location: [facility];  Service: Orthopedics;  Laterality: Left;   TOTAL KNEE  ARTHROPLASTY Right 2022   TRANSESOPHAGEAL ECHOCARDIOGRAM (CATH LAB) N/A 06/05/2023   Procedure: TRANSESOPHAGEAL ECHOCARDIOGRAM;  Surgeon: Cindie Ole DASEN, MD;  Location: [facility];  Service: Cardiovascular;  Laterality: N/A;   Family History  Problem Relation Age of Onset   Diabetes Mother    Hypertension Mother    Heart disease Maternal Grandfather    Diabetes Maternal Aunt    Cancer Neg Hx    Breast cancer Neg Hx    Colon cancer Neg Hx    Stomach cancer Neg Hx    Esophageal cancer Neg Hx    Social History   Occupational History   Occupation: School bus driver  Tobacco Use   Smoking status: Former    Current packs/day: 0.00    Average packs/day: 1 pack/day for 20.0 years (20.0 ttl pk-yrs)    Types: Cigarettes    Start date: 04/11/1989    Quit date: 04/11/2009    Years since quitting: 14.8   Smokeless tobacco: Never  Vaping Use   Vaping status: Never Used  Substance and Sexual Activity   Alcohol use: Not Currently    Alcohol/week: 1.0 standard drink of alcohol    Comment: rare   Drug use: No   Sexual activity: Yes    Birth control/protection: Other-see comments    Comment: Hysterectomy   Tobacco Counseling Counseling given: Not Answered  SDOH Screenings   Food Insecurity: No Food Insecurity (02/10/2024)  Housing: Low Risk (02/10/2024)  Transportation Needs: No Transportation Needs (02/10/2024)  Utilities: Not At Risk (02/10/2024)  Alcohol Screen: Low Risk (12/17/2023)  Depression (PHQ2-9): Low Risk (02/10/2024)  Financial Resource Strain: Low Risk (12/17/2023)  Physical Activity: Sufficiently Active (02/10/2024)  Social Connections: Moderately Integrated (02/10/2024)  Stress: No Stress Concern Present (02/10/2024)  Tobacco Use: Medium Risk (02/10/2024)  Health Literacy: Adequate Health Literacy (02/10/2024)   See flowsheets for full screening details  Depression Screen PHQ 2 & 9 Depression Scale- Over the past 2 weeks, how often have you been  bothered by any of the following problems? Little interest or pleasure in doing things: 0 Feeling down, depressed, or hopeless (PHQ Adolescent also includes...irritable): 0 PHQ-2 Total Score: 0 Trouble falling or staying asleep, or sleeping too much: 1 Feeling tired or having little energy: 0 Poor appetite or overeating (PHQ Adolescent also includes...weight loss): 0 Feeling bad about yourself - or that you are a failure or have let yourself or your family down: 0 Trouble concentrating on things, such as reading the newspaper or watching television (PHQ Adolescent also includes...like school work): 0 Moving or speaking so slowly that other people could have noticed. Or the opposite - being so fidgety or restless that you have been moving around a lot more than usual: 0 Thoughts that you would be better off dead, or of hurting yourself in some way: 0 PHQ-9 Total Score: 1 If you checked off any  problems, how difficult have these problems made it for you to do your work, take care of things at home, or get along with other people?: Not difficult at all     Goals Addressed             This Visit's Progress    Patient Stated       To retire             Objective:    Today's Vitals   02/10/24 1124  Weight: 195 lb (88.5 kg)  Height: 5' 2 (1.575 m)   Body mass index is 35.67 kg/m.  Hearing/Vision screen Hearing Screening - Comments:: No trouble hearing Vision Screening - Comments:: [facility] Up to date Immunizations and Health Maintenance Health Maintenance  Topic Date Due   COVID-19 Vaccine (6 - 2025-26 season) 10/13/2023   Zoster Vaccines- Shingrix (1 of 2) 03/18/2024 (Originally 09/16/1974)   Influenza Vaccine  05/11/2024 (Originally 09/12/2023)   HEMOGLOBIN A1C  06/15/2024   OPHTHALMOLOGY EXAM  06/27/2024   FOOT EXAM  08/04/2024   Diabetic kidney evaluation - Urine ACR  11/23/2024   Diabetic kidney evaluation - eGFR measurement  12/28/2024   Medicare Annual  Wellness (AWV)  02/09/2025   Mammogram  12/29/2025   Colonoscopy  02/02/2033   Pneumococcal Vaccine: 50+ Years  Completed   Bone Density Scan  Completed   Hepatitis C Screening  Completed   Meningococcal B Vaccine  Aged Out   Lung Cancer Screening  Discontinued   DTaP/Tdap/Td  Discontinued        Assessment/Plan:  This is a routine wellness examination for Darlene Wade.  Patient Care Team: Tysinger, Alm RAMAN, PA-C as PCP - General (Family Medicine) Cindie Ole DASEN, MD as PCP - Electrophysiology (Cardiology) Nahser, Aleene PARAS, MD (Inactive) as Consulting Physician (Cardiology) Joshua Rush, OD (Optometry) Hamilton, Jon DEL, [facility] (Pharmacist) [facility]  I have personally reviewed and noted the following in the patients chart:   Medical and social history Use of alcohol, tobacco or illicit drugs  Current medications and supplements including opioid prescriptions. Functional ability and status Nutritional status Physical activity Advanced directives List of other physicians Hospitalizations, surgeries, and ER visits in previous 12 months Vitals Screenings to include cognitive, depression, and falls Referrals and appointments  No orders of the defined types were placed in this encounter.  In addition, I have reviewed and discussed with patient certain preventive protocols, quality metrics, and best practice recommendations. A written personalized care plan for preventive services as well as general preventive health recommendations were provided to patient.   Jairy Angulo, LPN   [id number]   Return in 1 year (on 02/09/2025).  After Visit Summary: (MyChart) Due to this being a telephonic visit, the after visit summary with patients personalized plan was offered to patient via MyChart   Nurse Notes:  "

## 2024-02-10 NOTE — Patient Instructions (Signed)
 Ms. Darlene Wade,  Thank you for taking the time for your Medicare Wellness Visit. I appreciate your continued commitment to your health goals. Please review the care plan we discussed, and feel free to reach out if I can assist you further.  Please note that Annual Wellness Visits do not include a physical exam. Some assessments may be limited, especially if the visit was conducted virtually. If needed, we may recommend an in-person follow-up with your provider.  Ongoing Care Seeing your primary care provider every 3 to 6 months helps us  monitor your health and provide consistent, personalized care.   Referrals If a referral was made during today's visit and you haven't received any updates within two weeks, please contact the referred provider directly to check on the status.  Recommended Screenings:  Health Maintenance  Topic Date Due   COVID-19 Vaccine (6 - 2025-26 season) 10/13/2023   Zoster (Shingles) Vaccine (1 of 2) 03/18/2024*   Flu Shot  05/11/2024*   Hemoglobin A1C  06/15/2024   Eye exam for diabetics  06/27/2024   Complete foot exam   08/04/2024   Yearly kidney health urinalysis for diabetes  11/23/2024   Yearly kidney function blood test for diabetes  12/28/2024   Medicare Annual Wellness Visit  02/09/2025   Breast Cancer Screening  12/29/2025   Colon Cancer Screening  02/02/2033   Pneumococcal Vaccine for age over 24  Completed   Osteoporosis screening with Bone Density Scan  Completed   Hepatitis C Screening  Completed   Meningitis B Vaccine  Aged Out   Screening for Lung Cancer  Discontinued   DTaP/Tdap/Td vaccine  Discontinued  *Topic was postponed. The date shown is not the original due date.       02/10/2024   11:25 AM  Advanced Directives  Does Patient Have a Medical Advance Directive? No  Would patient like information on creating a medical advance directive? No - Patient declined    Vision: Annual vision screenings are recommended for early detection of  glaucoma, cataracts, and diabetic retinopathy. These exams can also reveal signs of chronic conditions such as diabetes and high blood pressure.  Dental: Annual dental screenings help detect early signs of oral cancer, gum disease, and other conditions linked to overall health, including heart disease and diabetes.  Please see the attached documents for additional preventive care recommendations.    Ms. Darlene Wade , Thank you for taking time to come for your Medicare Wellness Visit. I appreciate your ongoing commitment to your health goals. Please review the following plan we discussed and let me know if I can assist you in the future.   Screening recommendations/referrals: Colonoscopy:  Mammogram:  Bone Density:  Recommended yearly ophthalmology/optometry visit for glaucoma screening and checkup Recommended yearly dental visit for hygiene and checkup  Vaccinations: Influenza vaccine:  Pneumococcal vaccine:  Tdap vaccine:  Shingles vaccine:        Preventive Care 65 Years and Older, Female Preventive care refers to lifestyle choices and visits with your health care provider that can promote health and wellness. What does preventive care include? A yearly physical exam. This is also called an annual well check. Dental exams once or twice a year. Routine eye exams. Ask your health care provider how often you should have your eyes checked. Personal lifestyle choices, including: Daily care of your teeth and gums. Regular physical activity. Eating a healthy diet. Avoiding tobacco and drug use. Limiting alcohol use. Practicing safe sex. Taking low-dose aspirin  every day. Taking vitamin and  mineral supplements as recommended by your health care provider. What happens during an annual well check? The services and screenings done by your health care provider during your annual well check will depend on your age, overall health, lifestyle risk factors, and family history of  disease. Counseling  Your health care provider may ask you questions about your: Alcohol use. Tobacco use. Drug use. Emotional well-being. Home and relationship well-being. Sexual activity. Eating habits. History of falls. Memory and ability to understand (cognition). Work and work astronomer. Reproductive health. Screening  You may have the following tests or measurements: Height, weight, and BMI. Blood pressure. Lipid and cholesterol levels. These may be checked every 5 years, or more frequently if you are over 23 years old. Skin check. Lung cancer screening. You may have this screening every year starting at age 35 if you have a 30-pack-year history of smoking and currently smoke or have quit within the past 15 years. Fecal occult blood test (FOBT) of the stool. You may have this test every year starting at age 48. Flexible sigmoidoscopy or colonoscopy. You may have a sigmoidoscopy every 5 years or a colonoscopy every 10 years starting at age 47. Hepatitis C blood test. Hepatitis B blood test. Sexually transmitted disease (STD) testing. Diabetes screening. This is done by checking your blood sugar (glucose) after you have not eaten for a while (fasting). You may have this done every 1-3 years. Bone density scan. This is done to screen for osteoporosis. You may have this done starting at age 30. Mammogram. This may be done every 1-2 years. Talk to your health care provider about how often you should have regular mammograms. Talk with your health care provider about your test results, treatment options, and if necessary, the need for more tests. Vaccines  Your health care provider may recommend certain vaccines, such as: Influenza vaccine. This is recommended every year. Tetanus, diphtheria, and acellular pertussis (Tdap, Td) vaccine. You may need a Td booster every 10 years. Zoster vaccine. You may need this after age 17. Pneumococcal 13-valent conjugate (PCV13) vaccine. One  dose is recommended after age 67. Pneumococcal polysaccharide (PPSV23) vaccine. One dose is recommended after age 31. Talk to your health care provider about which screenings and vaccines you need and how often you need them. This information is not intended to replace advice given to you by your health care provider. Make sure you discuss any questions you have with your health care provider. Document Released: 02/24/2015 Document Revised: 10/18/2015 Document Reviewed: 11/29/2014 Elsevier Interactive Patient Education  2017 [employer].  Fall Prevention in the Home Falls can cause injuries. They can happen to people of all ages. There are many things you can do to make your home safe and to help prevent falls. What can I do on the outside of my home? Regularly fix the edges of walkways and driveways and fix any cracks. Remove anything that might make you trip as you walk through a door, such as a raised step or threshold. Trim any bushes or trees on the path to your home. Use bright outdoor lighting. Clear any walking paths of anything that might make someone trip, such as rocks or tools. Regularly check to see if handrails are loose or broken. Make sure that both sides of any steps have handrails. Any raised decks and porches should have guardrails on the edges. Have any leaves, snow, or ice cleared regularly. Use sand or salt on walking paths during winter. Clean up any spills in  your garage right away. This includes oil or grease spills. What can I do in the bathroom? Use night lights. Install grab bars by the toilet and in the tub and shower. Do not use towel bars as grab bars. Use non-skid mats or decals in the tub or shower. If you need to sit down in the shower, use a plastic, non-slip stool. Keep the floor dry. Clean up any water  that spills on the floor as soon as it happens. Remove soap buildup in the tub or shower regularly. Attach bath mats securely with double-sided  non-slip rug tape. Do not have throw rugs and other things on the floor that can make you trip. What can I do in the bedroom? Use night lights. Make sure that you have a light by your bed that is easy to reach. Do not use any sheets or blankets that are too big for your bed. They should not hang down onto the floor. Have a firm chair that has side arms. You can use this for support while you get dressed. Do not have throw rugs and other things on the floor that can make you trip. What can I do in the kitchen? Clean up any spills right away. Avoid walking on wet floors. Keep items that you use a lot in easy-to-reach places. If you need to reach something above you, use a strong step stool that has a grab bar. Keep electrical cords out of the way. Do not use floor polish or wax that makes floors slippery. If you must use wax, use non-skid floor wax. Do not have throw rugs and other things on the floor that can make you trip. What can I do with my stairs? Do not leave any items on the stairs. Make sure that there are handrails on both sides of the stairs and use them. Fix handrails that are broken or loose. Make sure that handrails are as long as the stairways. Check any carpeting to make sure that it is firmly attached to the stairs. Fix any carpet that is loose or worn. Avoid having throw rugs at the top or bottom of the stairs. If you do have throw rugs, attach them to the floor with carpet tape. Make sure that you have a light switch at the top of the stairs and the bottom of the stairs. If you do not have them, ask someone to add them for you. What else can I do to help prevent falls? Wear shoes that: Do not have high heels. Have rubber bottoms. Are comfortable and fit you well. Are closed at the toe. Do not wear sandals. If you use a stepladder: Make sure that it is fully opened. Do not climb a closed stepladder. Make sure that both sides of the stepladder are locked into place. Ask  someone to hold it for you, if possible. Clearly mark and make sure that you can see: Any grab bars or handrails. First and last steps. Where the edge of each step is. Use tools that help you move around (mobility aids) if they are needed. These include: Canes. Walkers. Scooters. Crutches. Turn on the lights when you go into a dark area. Replace any light bulbs as soon as they burn out. Set up your furniture so you have a clear path. Avoid moving your furniture around. If any of your floors are uneven, fix them. If there are any pets around you, be aware of where they are. Review your medicines with your doctor. Some medicines  can make you feel dizzy. This can increase your chance of falling. Ask your doctor what other things that you can do to help prevent falls. This information is not intended to replace advice given to you by your health care provider. Make sure you discuss any questions you have with your health care provider. Document Released: 11/24/2008 Document Revised: 07/06/2015 Document Reviewed: 03/04/2014 Elsevier Interactive Patient Education  2017 [employer].

## 2024-02-10 NOTE — Progress Notes (Signed)
 " Cardiology Office Note:   Date:  02/10/2024  ID:  Darlene Wade, Darlene Wade [birth year], MRN [id number] PCP: Bulah Alm GORMAN DEVONNA  [facility] Providers Cardiologist:  None Electrophysiologist:  OLE ONEIDA HOLTS, MD { Chief Complaint: No chief complaint on file.     History of Present Illness:   Darlene Wade is a 68 y.o. female with a PMH of HFimpEF, PAF s/p ablation and Watchman device placement (06/05/23), HTN, HLD, DM2, prior tobacco use, cervical DDD s/p fusion (11/25) and obesity who presents for follow up.  Last seen in clinic by Jackee Alberts in July.  Subsequently has undergone cervical disc fusion procedure.   Past Medical History:  Diagnosis Date   Asthma 07/20/2018   one puff per day   Atrial fibrillation [facility])    CHF (congestive heart failure) (HCC)    Chronic combined systolic and diastolic heart failure (HCC) 03/03/2018   Echo 07/2018: EF 40-45, diff HK worse in Inf base, normal RVSF   Diabetes mellitus without complication (HCC) 2012   Diabetic eye exam (HCC)    [employer]   Difficult intubation 06/05/2023   Glidescope for intubation recommended 06/05/2023   Dyspnea    Elbow fracture, right 2007   Former smoker    20 pack year history, quit 2010   Hyperlipidemia    Hypertension    Insomnia    Lung nodule    Chest CT 07/2018:  RLL nodule resolved.  3 mm subpleural LUL nodule.  Repeat in 1 year if high risk.    Obesity    PONV (postoperative nausea and vomiting) 2014   1 time   Sleep apnea    uses mouthguard   Wears glasses    reading     Studies Reviewed:    EKG: ***       Cardiac Studies & Procedures   ______________________________________________________________________________________________     ECHOCARDIOGRAM  ECHOCARDIOGRAM COMPLETE 11/11/2022  Narrative ECHOCARDIOGRAM REPORT    Patient Name:   Darlene Wade Date of Exam: 11/11/2022 Medical Rec #:  [id number]      Height:       62.0 in Accession #:    [id number]     Weight:        208.0 lb Date of Birth:  November 28, 1955       BSA:          1.944 m Patient Age:    67 years       BP:           118/62 mmHg Patient Gender: F              HR:           64 bpm. Exam Location:  [address]  Procedure: 2D Echo, Cardiac Doppler and Color Doppler  Indications:    I50.42 CHF  History:        Patient has prior history of Echocardiogram examinations, most recent 09/28/2021. CHF, Arrythmias:Atrial Fibrillation, Signs/Symptoms:Dyspnea; Risk Factors:Family History of Coronary Artery Disease, Hypertension, Diabetes, Dyslipidemia and Former Smoker. Obesity, Prior EF 40-45%.  Sonographer:    Heather Hawks RDCS Referring Phys: JACKEE VEAR DICK  IMPRESSIONS   1. Left ventricular ejection fraction, by estimation, is 40%. The left ventricle has mild to moderately decreased function. The left ventricle demonstrates global hypokinesis. Left ventricular diastolic parameters are consistent with Grade I diastolic dysfunction (impaired relaxation). 2. Right ventricular systolic function is normal. The right ventricular size is normal. Tricuspid regurgitation signal is inadequate for  assessing PA pressure. 3. The mitral valve is normal in structure. No evidence of mitral valve regurgitation. No evidence of mitral stenosis. 4. The aortic valve is tricuspid. Aortic valve regurgitation is not visualized. No aortic stenosis is present. 5. The inferior vena cava is normal in size with greater than 50% respiratory variability, suggesting right atrial pressure of 3 mmHg.  FINDINGS Left Ventricle: Left ventricular ejection fraction, by estimation, is 40%. The left ventricle has mild to moderately decreased function. The left ventricle demonstrates global hypokinesis. The left ventricular internal cavity size was normal in size. There is no left ventricular hypertrophy. Left ventricular diastolic parameters are consistent with Grade I diastolic dysfunction (impaired relaxation).  Right Ventricle:  The right ventricular size is normal. No increase in right ventricular wall thickness. Right ventricular systolic function is normal. Tricuspid regurgitation signal is inadequate for assessing PA pressure.  Left Atrium: Left atrial size was normal in size.  Right Atrium: Right atrial size was normal in size.  Pericardium: There is no evidence of pericardial effusion.  Mitral Valve: The mitral valve is normal in structure. No evidence of mitral valve regurgitation. No evidence of mitral valve stenosis.  Tricuspid Valve: The tricuspid valve is normal in structure. Tricuspid valve regurgitation is not demonstrated.  Aortic Valve: The aortic valve is tricuspid. Aortic valve regurgitation is not visualized. No aortic stenosis is present.  Pulmonic Valve: The pulmonic valve was normal in structure. Pulmonic valve regurgitation is not visualized.  Aorta: The aortic root is normal in size and structure.  Venous: The inferior vena cava is normal in size with greater than 50% respiratory variability, suggesting right atrial pressure of 3 mmHg.  IAS/Shunts: No atrial level shunt detected by color flow Doppler.   LEFT VENTRICLE PLAX 2D LVIDd:         4.20 cm   Diastology LVIDs:         3.40 cm   LV e' medial:    5.87 cm/s LV PW:         0.70 cm   LV E/e' medial:  12.2 LV IVS:        0.60 cm   LV e' lateral:   7.40 cm/s LVOT diam:     1.90 cm   LV E/e' lateral: 9.7 LV SV:         67 LV SV Index:   34 LVOT Area:     2.84 cm   RIGHT VENTRICLE             IVC RV Basal diam:  3.40 cm     IVC diam: 1.30 cm RV S prime:     11.40 cm/s TAPSE (M-mode): 2.4 cm  LEFT ATRIUM             Index        RIGHT ATRIUM           Index LA diam:        2.70 cm 1.39 cm/m   RA Pressure: 3.00 mmHg LA Vol (A2C):   29.3 ml 15.07 ml/m  RA Area:     13.10 cm LA Vol (A4C):   32.3 ml 16.62 ml/m  RA Volume:   27.40 ml  14.10 ml/m LA Biplane Vol: 30.6 ml 15.74 ml/m AORTIC VALVE LVOT Vmax:   102.50 cm/s LVOT  Vmean:  65.300 cm/s LVOT VTI:    0.236 m  AORTA Ao Root diam: 2.70 cm Ao Asc diam:  3.00 cm  MITRAL VALVE  TRICUSPID VALVE MV Area (PHT)  cm         Estimated RAP:  3.00 mmHg MV Decel Time: 222 msec MV E velocity: 71.65 cm/s  SHUNTS MV A velocity: 81.90 cm/s  Systemic VTI:  0.24 m MV E/A ratio:  0.87        Systemic Diam: 1.90 cm  Dalton McleanMD Electronically signed by Ezra Kanner Signature Date/Time: 11/11/2022/12:17:04 PM    Final   TEE  ECHO TEE 06/05/2023  Narrative TRANSESOPHOGEAL ECHO REPORT    Patient Name:   Darlene Wade Date of Exam: 06/05/2023 Medical Rec #:  [id number]      Height:       62.0 in Accession #:    [id number]     Weight:       208.0 lb Date of Birth:  1955/12/08       BSA:          1.944 m Patient Age:    67 years       BP:           113/61 mmHg Patient Gender: F              HR:           73 bpm. Exam Location:  Inpatient  Procedure: Transesophageal Echo, 3D Echo, Color Doppler and Cardiac Doppler (Both Spectral and Color Flow Doppler were utilized during procedure).  Indications:     I48.0 Paroxysmal atrial fibrillation  History:         Patient has prior history of Echocardiogram examinations, most recent 11/11/2022. CHF, CAD, Arrythmias:Atrial Fibrillation; Risk Factors:Hypertension, Diabetes and Dyslipidemia.  Sonographer:     Damien Senior RDCS Referring Phys:  [id number] OLE ONEIDA HOLTS Diagnosing Phys: Maude Emmer MD   Sonographer Comments: Watchman    PROCEDURE: After discussion of the risks and benefits of a TEE, an informed consent was obtained from the patient. The transesophogeal probe was passed without difficulty through the esophogus of the patient. Sedation performed by different physician. The patient was monitored while under deep sedation. The patient developed no complications during the procedure.  IMPRESSIONS   1. Wind sock appendage with no thrombus. Well placed 31 mm Watchman FLX device  with no large shoulder, negative tugg test, no leak by color flow and average compression 16%. 2. Left ventricular ejection fraction, by estimation, is 65 to 70%. The left ventricle has normal function. 3. Right ventricular systolic function is normal. The right ventricular size is normal. 4. No left atrial/left atrial appendage thrombus was detected. 5. The mitral valve is normal in structure. Trivial mitral valve regurgitation. 6. The aortic valve is tricuspid. Aortic valve regurgitation is not visualized. No aortic stenosis is present. 7. 3D performed of the LAA and demonstrates Extensive 3D imaging performed to measure appendage and size Watchman device. 8. 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  FINDINGS Left Ventricle: Left ventricular ejection fraction, by estimation, is 65 to 70%. The left ventricle has normal function. Strain was performed and the global longitudinal strain is indeterminate. The left ventricular internal cavity size was normal in size.  Right Ventricle: The right ventricular size is normal. Right vetricular wall thickness was not assessed. Right ventricular systolic function is normal.  Left Atrium: Left atrial size was normal in size. No left atrial/left atrial appendage thrombus was detected.  Right Atrium: Right atrial size was normal in size.  Pericardium: There is no evidence of pericardial effusion.  Mitral Valve: The mitral valve  is normal in structure. Trivial mitral valve regurgitation.  Tricuspid Valve: The tricuspid valve is normal in structure. Tricuspid valve regurgitation is mild.  Aortic Valve: The aortic valve is tricuspid. Aortic valve regurgitation is not visualized. No aortic stenosis is present.  Pulmonic Valve: The pulmonic valve was normal in structure. Pulmonic valve regurgitation is trivial.  Aorta: The aortic root is normal in size and structure.  IAS/Shunts: Left to right shunt present post trans septal  punture.  Additional Comments: Wind sock appendage with no thrombus. Well placed 31 mm Watchman FLX device with no large shoulder, negative tugg test, no leak by color flow and average compression 16%. 3D was performed not requiring image post processing on an independent workstation and was indeterminate. Spectral Doppler performed.  Maude Emmer MD Electronically signed by Maude Emmer MD Signature Date/Time: 06/05/2023/4:15:37 PM    Final  MONITORS  LONG TERM MONITOR (3-14 DAYS) 10/15/2022  Narrative Patch Wear Time:  6 days and 19 hours (2024-08-22T09:03:55-0400 to 2024-08-29T04:15:47-0400)  Patient had a min HR of 65 bpm, max HR of 138 bpm, and avg HR of 90 bpm. Predominant underlying rhythm was Sinus Rhythm. 2.2% supraventricular ectopy <1% ventricular ectopy Triggered episodes and heart racing associated with both sinus rhythm and sinus tachycardia  Will Camnitz, MD   CT SCANS  CT CORONARY FRACTIONAL FLOW RESERVE DATA PREP 03/05/2018  Narrative EXAM: CT FFR ANALYSIS  CLINICAL DATA:  68 year old female with chest pain.  FINDINGS: FFRct analysis was performed on the original cardiac CT angiogram dataset. Diagrammatic representation of the FFRct analysis is provided in a separate PDF document in PACS. This dictation was created using the PDF document and an interactive 3D model of the results. 3D model is not available in the EMR/PACS. Normal FFR range is >0.80.  1. Left Main:  No significant stenosis.  2. LAD: No significant stenosis. 3. LCX: No significant stenosis. 4. RCA: No significant stenosis.  IMPRESSION: 1.  CT FFR analysis didn't show any significant stenosis.   Electronically Signed By: Leim Moose On: 03/06/2018 10:59   CT CORONARY MORPH W/CTA COR W/SCORE 03/05/2018  Addendum 03/06/2018 11:13 AM ADDENDUM REPORT: 03/06/2018 11:10  CLINICAL DATA:  68 year old female with h/o obesity, DM, HTN, HLP and CHF.  EXAM: Cardiac/Coronary   CT  TECHNIQUE: The patient was scanned on a [employer].  FINDINGS: A 120 kV prospective scan was triggered in the descending thoracic aorta at 111 HU's. Axial non-contrast 3 mm slices were carried out through the heart. The data set was analyzed on a dedicated work station and scored using the Agatson method. Gantry rotation speed was 250 msecs and collimation was .6 mm. No beta blockade and 0.8 mg of sl NTG was given. The 3D data set was reconstructed in 5% intervals of the 67-82 % of the R-R cycle. Diastolic phases were analyzed on a dedicated work station using MPR, MIP and VRT modes. The patient received 80 cc of contrast.  Aorta: Normal size. Mild diffuse calcifications and atherosclerotic plaque. No dissection.  Aortic Valve:  Trileaflet.  Trivial calcifications.  Coronary Arteries:  Normal coronary origin.  Right dominance.  RCA is a medium caliber dominant artery that gives rise to PDA and PLVB. There is minimal non-calcified plaque.  Left main is a large artery that gives rise to LAD, ramus intermedius and LCX arteries. Left main has no plaque.  LAD is a large vessel that has mild plaque in the ostial portion and mid portion with stenosis 25-50%.  Ramus  intermedius is a large artery that has no plaque.  LCX is a non-dominant artery that gives rise to one large OM1 branch. There is no plaque.  Other findings:  Normal pulmonary vein drainage into the left atrium.  Normal let atrial appendage without a thrombus.  Normal size of the pulmonary artery.  IMPRESSION: 1. Coronary calcium  score of 9. This was 27 percentile for age and sex matched control.  2. Normal coronary origin with right dominance.  3. Mild non-obstructive CAD. Aggressive risk factor modification is recommended.   Electronically Signed By: Leim Moose On: 03/06/2018 11:10  Narrative EXAM: OVER-READ INTERPRETATION  CT CHEST  The following report is an over-read  performed by radiologist Dr. Rockey Kilts of [facility] on 03/05/2018. This over-read does not include interpretation of cardiac or coronary anatomy or pathology. The coronary CTA interpretation by the cardiologist is attached.  COMPARISON:  Chest radiograph 04/22/2017.  No prior CT.  FINDINGS: Vascular: Aortic atherosclerosis. No dissection. No central pulmonary embolism, on this non-dedicated study.  Mediastinum/Nodes: No imaged thoracic adenopathy.  Lungs/Pleura: No imaged pleural fluid. Possible mixed attenuation 8 mm nodule in the right lower lobe on image 17/12. A subpleural 3 mm left upper lobe pulmonary nodule on image 4/12.  Ground-glass density in the left lower lobe on image 29/12 is likely due to subsegmental atelectasis.  Upper Abdomen: Normal imaged portions of the liver, spleen, stomach.  Musculoskeletal: No acute osseous abnormality. Multilevel thoracic spondylosis.  IMPRESSION: 1.  No acute findings in the imaged extracardiac chest. 2. Suspicion of a mixed attenuation right lower lobe pulmonary nodule. Consider dedicated chest CT at 3 months to confirm persistence. This recommendation follows the consensus statement: Guidelines for Management of Incidental Pulmonary Nodules Detected on CT Images:From the Fleischner Society 2017; published online before print (10.1148/radiol.7982838340). 3.  Aortic Atherosclerosis (ICD10-I70.0).  Electronically Signed: By: Rockey Kilts M.D. On: 03/05/2018 13:14     ______________________________________________________________________________________________      Risk Assessment/Calculations:   {Does this patient have ATRIAL FIBRILLATION?:[id number]} No BP recorded.  {Refresh Note OR Click here to enter BP  :1}***        Physical Exam:     VS:  There were no vitals taken for this visit. ***    Wt Readings from Last 3 Encounters:  02/10/24 195 lb (88.5 kg)  01/01/24 204 lb (92.5 kg)  12/29/23 205 lb  9.6 oz (93.3 kg)     GEN: Well nourished, well developed, in no acute distress NECK: No JVD; No carotid bruits CARDIAC: ***RRR, no murmurs, rubs, gallops RESPIRATORY:  Clear to auscultation without rales, wheezing or rhonchi  ABDOMEN: Soft, non-tender, non-distended, normal bowel sounds EXTREMITIES:  Warm and well perfused, no edema; No deformity, 2+ radial pulses PSYCH: Normal mood and affect   Assessment & Plan Chronic combined systolic and diastolic heart failure (HCC) Complete echocardiogram*** Essential hypertension, benign  Hyperlipidemia associated with type 2 diabetes mellitus (HCC)  Paroxysmal atrial fibrillation (HCC) Reestablish with EP***      {Are you ordering a CV Procedure (e.g. stress test, cath, DCCV, TEE, etc)?   Press F2        :[id number]}   This note was written with the assistance of a dictation microphone or AI dictation software. Please excuse any typos or grammatical errors.   Signed, Georganna Archer, MD 02/10/2024 10:26 PM    [facility]  "

## 2024-02-11 ENCOUNTER — Ambulatory Visit: Attending: Internal Medicine | Admitting: Student in an Organized Health Care Education/Training Program

## 2024-02-11 VITALS — BP 108/70 | HR 85 | Ht 62.0 in | Wt 203.8 lb

## 2024-02-11 DIAGNOSIS — I5042 Chronic combined systolic (congestive) and diastolic (congestive) heart failure: Secondary | ICD-10-CM

## 2024-02-11 DIAGNOSIS — E1169 Type 2 diabetes mellitus with other specified complication: Secondary | ICD-10-CM

## 2024-02-11 DIAGNOSIS — E785 Hyperlipidemia, unspecified: Secondary | ICD-10-CM | POA: Diagnosis not present

## 2024-02-11 DIAGNOSIS — I10 Essential (primary) hypertension: Secondary | ICD-10-CM | POA: Diagnosis not present

## 2024-02-11 DIAGNOSIS — I480 Paroxysmal atrial fibrillation: Secondary | ICD-10-CM

## 2024-02-11 MED ORDER — DAPAGLIFLOZIN PROPANEDIOL 10 MG PO TABS: 10.0000 mg | ORAL_TABLET | Freq: Every day | ORAL | 3 refills | Status: AC

## 2024-02-11 NOTE — Assessment & Plan Note (Signed)
BP is at goal.  No changes.

## 2024-02-11 NOTE — Assessment & Plan Note (Signed)
-   Most recent lipid panel is at goal.  No changes. Continue atorvastatin  40 mg daily

## 2024-02-11 NOTE — Patient Instructions (Addendum)
 MEDICATION:  INCREASE Farxiga  to 10 mg daily   Follow-Up: At [facility], you and your health needs are our priority.  As part of our continuing mission to provide you with exceptional heart care, our providers are all part of one team.  This team includes your primary Cardiologist (physician) and Advanced Practice Providers or APPs (Physician Assistants and Nurse Practitioners) who all work together to provide you with the care you need, when you need it.  Please transition EP provider to Dr. [place]met[place]   Your next appointment:   1 year(s)  Provider:   Georganna Archer, MD

## 2024-02-23 ENCOUNTER — Encounter: Payer: Self-pay | Admitting: Student in an Organized Health Care Education/Training Program

## 2024-02-24 ENCOUNTER — Telehealth: Payer: Self-pay | Admitting: Medical

## 2024-02-24 NOTE — Telephone Encounter (Signed)
 Pt called states Farxiga  is over $200 & can't afford, #4 boxes 10 mg samples given

## 2024-02-27 ENCOUNTER — Encounter: Payer: Self-pay | Admitting: Student in an Organized Health Care Education/Training Program

## 2024-03-09 ENCOUNTER — Telehealth: Payer: Self-pay | Admitting: Internal Medicine

## 2024-03-09 NOTE — Telephone Encounter (Signed)
 Waiting on form

## 2024-03-09 NOTE — Telephone Encounter (Signed)
 Copied from CRM #[id number]. Topic: Clinical - Prescription Issue >> Mar 09, 2024  2:33 PM Berwyn MATSU wrote: Reason for CRM: [facility] called in requesting a continous glucometer monitor freestyle libre for patient. Per [facility] they will send fax for order and or if MD can send order for patient.   Please fax to: [phone]   May you please assist.

## 2024-03-10 NOTE — Telephone Encounter (Signed)
 I have written rx and faxed it to [facility] at [phone]

## 2024-03-10 NOTE — Telephone Encounter (Signed)
 Pt has requested this. I will put it on rx pad since we have not yet received form

## 2024-03-10 NOTE — Telephone Encounter (Signed)
 Once form comes in, are you ok with me filling this out and faxing in

## 2024-04-27 ENCOUNTER — Ambulatory Visit: Admitting: Physician Assistant
